# Patient Record
Sex: Male | Born: 1970 | Race: White | Hispanic: No | Marital: Married | Smoking: Never smoker
Health system: Southern US, Community
[De-identification: ages and names within clinical notes are randomized; demographics above are authoritative.]

## PROBLEM LIST (undated history)

## (undated) DIAGNOSIS — E119 Type 2 diabetes mellitus without complications: Secondary | ICD-10-CM

## (undated) DIAGNOSIS — T7840XA Allergy, unspecified, initial encounter: Secondary | ICD-10-CM

## (undated) DIAGNOSIS — I10 Essential (primary) hypertension: Secondary | ICD-10-CM

## (undated) HISTORY — DX: Allergy, unspecified, initial encounter: T78.40XA

## (undated) HISTORY — PX: NO PAST SURGERIES: SHX2092

---

## 2012-09-22 ENCOUNTER — Emergency Department: Payer: Self-pay | Admitting: Emergency Medicine

## 2012-09-22 LAB — CBC
HGB: 12.5 g/dL — ABNORMAL LOW (ref 13.0–18.0)
MCHC: 32.8 g/dL (ref 32.0–36.0)
Platelet: 275 10*3/uL (ref 150–440)
RDW: 15 % — ABNORMAL HIGH (ref 11.5–14.5)
WBC: 8.9 10*3/uL (ref 3.8–10.6)

## 2012-09-22 LAB — COMPREHENSIVE METABOLIC PANEL
Albumin: 4.3 g/dL (ref 3.4–5.0)
Alkaline Phosphatase: 79 U/L (ref 50–136)
Anion Gap: 4 — ABNORMAL LOW (ref 7–16)
Bilirubin,Total: 0.3 mg/dL (ref 0.2–1.0)
Chloride: 106 mmol/L (ref 98–107)
Co2: 29 mmol/L (ref 21–32)
Creatinine: 0.89 mg/dL (ref 0.60–1.30)
EGFR (African American): >60
EGFR (Non-African Amer.): >60
Glucose: 96 mg/dL (ref 65–99)
Sodium: 139 mmol/L (ref 136–145)
Total Protein: 8.3 g/dL — ABNORMAL HIGH (ref 6.4–8.2)

## 2012-09-22 LAB — CK TOTAL AND CKMB (NOT AT ARMC)
CK, Total: 108 U/L (ref 35–232)
CK-MB: 1 ng/mL (ref 0.5–3.6)

## 2012-09-22 LAB — TROPONIN I: Troponin-I: <0.02 ng/mL

## 2012-10-08 ENCOUNTER — Emergency Department: Payer: Self-pay | Admitting: Emergency Medicine

## 2015-09-21 ENCOUNTER — Emergency Department
Admission: EM | Admit: 2015-09-21 | Discharge: 2015-09-21 | Disposition: A | Discharge status: Home / Self Care | Payer: Managed Care, Other (non HMO) | Attending: Emergency Medicine | Admitting: Emergency Medicine

## 2015-09-21 DIAGNOSIS — J309 Allergic rhinitis, unspecified: Secondary | ICD-10-CM | POA: Diagnosis not present

## 2015-09-21 DIAGNOSIS — R040 Epistaxis: Secondary | ICD-10-CM | POA: Diagnosis present

## 2015-09-21 MED ORDER — OXYMETAZOLINE HCL 0.05 % NA SOLN: 2.0000 | Freq: Two times a day (BID) | NASAL | Status: DC | PRN

## 2015-09-21 NOTE — Discharge Instructions (Signed)
Allergic Rhinitis Allergic rhinitis is when the mucous membranes in the nose respond to allergens. Allergens are particles in the air that cause your body to have an allergic reaction. This causes you to release allergic antibodies. Through a chain of events, these eventually cause you to release histamine into the blood stream. Although meant to protect the body, it is this release of histamine that causes your discomfort, such as frequent sneezing, congestion, and an itchy, runny nose.  CAUSES Seasonal allergic rhinitis (hay fever) is caused by pollen allergens that may come from grasses, trees, and weeds. Year-round allergic rhinitis (perennial allergic rhinitis) is caused by allergens such as house dust mites, pet dander, and mold spores. SYMPTOMS  Nasal stuffiness (congestion).  Itchy, runny nose with sneezing and tearing of the eyes. DIAGNOSIS Your health care provider can help you determine the allergen or allergens that trigger your symptoms. If you and your health care provider are unable to determine the allergen, skin or blood testing may be used. Your health care provider will diagnose your condition after taking your health history and performing a physical exam. Your health care provider may assess you for other related conditions, such as asthma, pink eye, or an ear infection. TREATMENT Allergic rhinitis does not have a cure, but it can be controlled by:  Medicines that block allergy symptoms. These may include allergy shots, nasal sprays, and oral antihistamines.  Avoiding the allergen. Hay fever may often be treated with antihistamines in pill or nasal spray forms. Antihistamines block the effects of histamine. There are over-the-counter medicines that may help with nasal congestion and swelling around the eyes. Check with your health care provider before taking or giving this medicine. If avoiding the allergen or the medicine prescribed do not work, there are many new medicines  your health care provider can prescribe. Stronger medicine may be used if initial measures are ineffective. Desensitizing injections can be used if medicine and avoidance does not work. Desensitization is when a patient is given ongoing shots until the body becomes less sensitive to the allergen. Make sure you follow up with your health care provider if problems continue. HOME CARE INSTRUCTIONS It is not possible to completely avoid allergens, but you can reduce your symptoms by taking steps to limit your exposure to them. It helps to know exactly what you are allergic to so that you can avoid your specific triggers. SEEK MEDICAL CARE IF:  You have a fever.  You develop a cough that does not stop easily (persistent).  You have shortness of breath.  You start wheezing.  Symptoms interfere with normal daily activities.   This information is not intended to replace advice given to you by your health care provider. Make sure you discuss any questions you have with your health care provider.   Document Released: 03/27/2001 Document Revised: 07/23/2014 Document Reviewed: 03/09/2013 Elsevier Interactive Patient Education 2016 [place] are common. They are due to a crack in the inside lining of your nose (mucous membrane) or from a small blood vessel that starts to bleed. Nosebleeds can be caused by many conditions, such as injury, infections, dry mucous membranes or dry climate, medicines, nose picking, and home heating and cooling systems. Most nosebleeds come from blood vessels in the front of your nose. HOME CARE INSTRUCTIONS   Try controlling your nosebleed by pinching your nostrils gently and continuously for at least 10 minutes.  Avoid blowing or sniffing your nose for a number of hours after having  a nosebleed.  Do not put gauze inside your nose yourself. If your nose was packed by your health care provider, try to maintain the pack inside of your nose until  your health care provider removes it.  If a gauze pack was used and it starts to fall out, gently replace it or cut off the end of it.  If a balloon catheter was used to pack your nose, do not cut or remove it unless your health care provider has instructed you to do that.  Avoid lying down while you are having a nosebleed. Sit up and lean forward.  Use a nasal spray decongestant to help with a nosebleed as directed by your health care provider.  Do not use petroleum jelly or mineral oil in your nose. These can drip into your lungs.  Maintain humidity in your home by using less air conditioning or by using a humidifier.  Aspirinand blood thinners make bleeding more likely. If you are prescribed these medicines and you suffer from nosebleeds, ask your health care provider if you should stop taking the medicines or adjust the dose. Do not stop medicines unless directed by your health care provider  Resume your normal activities as you are able, but avoid straining, lifting, or bending at the waist for several days.  If your nosebleed was caused by dry mucous membranes, use over-the-counter saline nasal spray or gel. This will keep the mucous membranes moist and allow them to heal. If you must use a lubricant, choose the water-soluble variety. Use it only sparingly, and do not use it within several hours of lying down.  Keep all follow-up visits as directed by your health care provider. This is important. SEEK MEDICAL CARE IF:  You have a fever.  You get frequent nosebleeds.  You are getting nosebleeds more often. SEEK IMMEDIATE MEDICAL CARE IF:  Your nosebleed lasts longer than 20 minutes.  Your nosebleed occurs after an injury to your face, and your nose looks crooked or broken.  You have unusual bleeding from other parts of your body.  You have unusual bruising on other parts of your body.  You feel light-headed or you faint.  You become sweaty.  You vomit blood.  Your  nosebleed occurs after a head injury.   This information is not intended to replace advice given to you by your health care provider. Make sure you discuss any questions you have with your health care provider.   Document Released: 04/11/2005 Document Revised: 07/23/2014 Document Reviewed: 02/15/2014 Elsevier Interactive Patient Education [employer].

## 2015-09-21 NOTE — ED Provider Notes (Signed)
[facility] Emergency Department Provider Note  ____________________________________________  Time seen: Approximately 9:04 PM  I have reviewed the triage vital signs and the nursing notes.   HISTORY  Chief Complaint Epistaxis    HPI Charles Mathis is a 45 y.o. male who presents emergency department for nosebleed. Patient states that he has known allergic rhinitis and is being treated by his primary care for same. He states that he has had increased amount of sneezing and runny nose which he has been rubbing it. He states that he has had "a few" episodes of epistaxis. Patient denies any headache, lightheadedness, visual acuity changes, neck pain, chest pain, shortness of breath, nausea or vomiting. He states that nosebleed is well controlled with direct pressure.   No past medical history on file.  There are no active problems to display for this patient.   No past surgical history on file.  Current Outpatient Rx  Name  Route  Sig  Dispense  Refill  . oxymetazoline (AFRIN) 0.05 % nasal spray   Each Nare   Place 2 sprays into both nostrils 2 (two) times daily as needed (epistaxis).   15 mL   2     Allergies Review of patient's allergies indicates no known allergies.  No family history on file.  Social History Social History  Substance Use Topics  . Smoking status: Not on file  . Smokeless tobacco: Not on file  . Alcohol Use: Not on file     Review of Systems  Constitutional: No fever/chills Eyes: No visual changes. No discharge ENT: No sore throat. Also for nasal congestion. Positive for sneezing. Positive for epistaxis. Cardiovascular: no chest pain. Respiratory: no cough. No SOB. Skin: Negative for rash. Neurological: Negative for headaches, focal weakness or numbness. 10-point ROS otherwise negative.  ____________________________________________   PHYSICAL EXAM:  VITAL SIGNS: ED Triage Vitals  Enc Vitals Group     BP  09/21/15 2030 164/83 mmHg     Pulse Rate 09/21/15 2030 88     Resp 09/21/15 2030 18     Temp 09/21/15 2030 98.3 F (36.8 C)     Temp src --      SpO2 09/21/15 2030 97 %     Weight --      Height --      Head Cir --      Peak Flow --      Pain Score 09/21/15 2030 1     Pain Loc --      Pain Edu? --      Excl. in [place]? --      Constitutional: Alert and oriented. Well appearing and in no acute distress. Eyes: Conjunctivae are normal. PERRL. EOMI. Head: Atraumatic. ENT:      Ears:       Nose: Boggy turbinates are visualized. Moderate clear rhinorrhea. Scabbing is noted over the Kiesselbach plexus in the right nares. No current bleeding.      Mouth/Throat: Mucous membranes are moist.  Neck: No stridor.   Hematological/Lymphatic/Immunilogical: No cervical lymphadenopathy. Cardiovascular: Normal rate, regular rhythm. Normal S1 and S2.  Good peripheral circulation. Respiratory: Normal respiratory effort without tachypnea or retractions. Lungs CTAB. Neurologic:  Normal speech and language. No gross focal neurologic deficits are appreciated.  Skin:  Skin is warm, dry and intact. No rash noted. Psychiatric: Mood and affect are normal. Speech and behavior are normal. Patient exhibits appropriate insight and judgement.   ____________________________________________   LABS (all labs ordered are listed, but only  abnormal results are displayed)  Labs Reviewed - No data to display ____________________________________________  EKG   ____________________________________________  RADIOLOGY   No results found.  ____________________________________________    PROCEDURES  Procedure(s) performed:       Medications - No data to display   ____________________________________________   INITIAL IMPRESSION / ASSESSMENT AND PLAN / ED COURSE  Pertinent labs & imaging results that were available during my care of the patient were reviewed by me and considered in my medical  decision making (see chart for details).  Patient's diagnosis is consistent with epistaxis from known allergic rhinitis. Patient is to continue his Zyrtec and Flonase. Patient will be placed on Afrin for return of symptoms. If patient continues to have symptoms he will follow up with ENT specialist. Patient verbalizes understanding of diagnosis and treatment plan and verbalizes compliance of same. Patient is given ED precautions to return to the ED for any worsening or new symptoms.     ____________________________________________  FINAL CLINICAL IMPRESSION(S) / ED DIAGNOSES  Final diagnoses:  Epistaxis  Allergic rhinitis, unspecified allergic rhinitis type      NEW MEDICATIONS STARTED DURING THIS VISIT:  New Prescriptions   OXYMETAZOLINE (AFRIN) 0.05 % NASAL SPRAY    Place 2 sprays into both nostrils 2 (two) times daily as needed (epistaxis).        This chart was dictated using voice recognition software/Dragon. Despite best efforts to proofread, errors can occur which can change the meaning. Any change was purely unintentional.    Darletta Moll, PA-C 09/21/15 2115  Daymon Larsen, MD 09/21/15 2228

## 2015-09-21 NOTE — ED Notes (Signed)
Pt reports nose bleed starting tonight off and on. Currently bleeding controlled.

## 2016-04-10 ENCOUNTER — Encounter: Payer: Self-pay | Admitting: Emergency Medicine

## 2016-04-10 ENCOUNTER — Emergency Department
Admission: EM | Admit: 2016-04-10 | Discharge: 2016-04-10 | Disposition: A | Discharge status: Home / Self Care | Payer: Managed Care, Other (non HMO) | Attending: Emergency Medicine | Admitting: Emergency Medicine

## 2016-04-10 DIAGNOSIS — I10 Essential (primary) hypertension: Secondary | ICD-10-CM | POA: Insufficient documentation

## 2016-04-10 DIAGNOSIS — R319 Hematuria, unspecified: Secondary | ICD-10-CM | POA: Diagnosis not present

## 2016-04-10 HISTORY — DX: Essential (primary) hypertension: I10

## 2016-04-10 LAB — BASIC METABOLIC PANEL
ANION GAP: 4 — AB (ref 5–15)
BUN: 19 mg/dL (ref 6–20)
CO2: 30 mmol/L (ref 22–32)
CREATININE: 0.93 mg/dL (ref 0.61–1.24)
Calcium: 9.3 mg/dL (ref 8.9–10.3)
Chloride: 103 mmol/L (ref 101–111)
Glucose, Bld: 97 mg/dL (ref 65–99)
Potassium: 3.6 mmol/L (ref 3.5–5.1)
SODIUM: 137 mmol/L (ref 135–145)

## 2016-04-10 LAB — URINALYSIS COMPLETE WITH MICROSCOPIC (ARMC ONLY)
Bacteria, UA: NONE SEEN
Bilirubin Urine: NEGATIVE
Glucose, UA: NEGATIVE mg/dL
KETONES UR: NEGATIVE mg/dL
Leukocytes, UA: NEGATIVE
Nitrite: NEGATIVE
PH: 6 (ref 5.0–8.0)
PROTEIN: 100 mg/dL — AB
SPECIFIC GRAVITY, URINE: 1.018 (ref 1.005–1.030)

## 2016-04-10 LAB — CBC WITH DIFFERENTIAL/PLATELET
BASOS ABS: 0.1 10*3/uL (ref 0–0.1)
BASOS PCT: 1 %
EOS ABS: 0.4 10*3/uL (ref 0–0.7)
EOS PCT: 6 %
HCT: 39.1 % — ABNORMAL LOW (ref 40.0–52.0)
Hemoglobin: 13 g/dL (ref 13.0–18.0)
Lymphocytes Relative: 25 %
Lymphs Abs: 1.9 10*3/uL (ref 1.0–3.6)
MCH: 26.6 pg (ref 26.0–34.0)
MCHC: 33.2 g/dL (ref 32.0–36.0)
MCV: 80.2 fL (ref 80.0–100.0)
MONO ABS: 0.8 10*3/uL (ref 0.2–1.0)
Monocytes Relative: 11 %
NEUTROS ABS: 4.6 10*3/uL (ref 1.4–6.5)
Neutrophils Relative %: 59 %
PLATELETS: 261 10*3/uL (ref 150–440)
RBC: 4.87 MIL/uL (ref 4.40–5.90)
RDW: 14.8 % — AB (ref 11.5–14.5)
WBC: 7.9 10*3/uL (ref 3.8–10.6)

## 2016-04-10 NOTE — ED Provider Notes (Signed)
[facility] Emergency Department Provider Note  ____________________________________________   First MD Initiated Contact with Patient 04/10/16 1526     (approximate)  I have reviewed the triage vital signs and the nursing notes.   HISTORY  Chief Complaint Hematuria   HPI Charles Mathis is a 45 y.o. male with a history of hypertension who is presenting to the emergency department today with 1 episode of hematuria. He says that he was at work and he urinated frank blood. He says that he has been urinating more frequently lately but denies any pain or burning with urination. Denies any abdominal pain, nausea or vomiting. Denies any known history of cancer in his family. Denies a history of smoking. Says he has ongoing chronic back pain but no worsening of this today.Denies any penile lesions or trauma.   Past Medical History:  Diagnosis Date  . Hypertension     There are no active problems to display for this patient.   History reviewed. No pertinent surgical history.  Prior to Admission medications   Medication Sig Start Date End Date Taking? Authorizing Provider  oxymetazoline (AFRIN) 0.05 % nasal spray Place 2 sprays into both nostrils 2 (two) times daily as needed (epistaxis). 09/21/15   Charline Bills Cuthriell, PA-C    Allergies Review of patient's allergies indicates no known allergies.  No family history on file.  Social History Social History  Substance Use Topics  . Smoking status: Never Smoker  . Smokeless tobacco: Never Used  . Alcohol use No    Review of Systems Constitutional: No fever/chills Eyes: No visual changes. ENT: No sore throat. Cardiovascular: Denies chest pain. Respiratory: Denies shortness of breath. Gastrointestinal: No abdominal pain.  No nausea, no vomiting.  No diarrhea.  No constipation. Genitourinary: Negative for dysuria. Musculoskeletal: Negative for back pain. Skin: Negative for rash. Neurological:  Negative for headaches, focal weakness or numbness.  10-point ROS otherwise negative.  ____________________________________________   PHYSICAL EXAM:  VITAL SIGNS: ED Triage Vitals  Enc Vitals Group     BP 04/10/16 1514 (!) 191/108     Pulse Rate 04/10/16 1514 90     Resp 04/10/16 1514 20     Temp 04/10/16 1514 98 F (36.7 C)     Temp Source 04/10/16 1514 Oral     SpO2 04/10/16 1514 98 %     Weight 04/10/16 1513 245 lb (111.1 kg)     Height 04/10/16 1513 5\' 7"  (1.702 m)     Head Circumference --      Peak Flow --      Pain Score 04/10/16 1519 0     Pain Loc --      Pain Edu? --      Excl. in [place]? --     Constitutional: Alert and oriented. Well appearing and in no acute distress. Eyes: Conjunctivae are normal. PERRL. EOMI. Head: Atraumatic. Nose: No congestion/rhinnorhea. Mouth/Throat: Mucous membranes are moist.   Neck: No stridor.   Cardiovascular: Normal rate, regular rhythm. Grossly normal heart sounds.   Respiratory: Normal respiratory effort.  No retractions. Lungs CTAB. Gastrointestinal: Soft and nontender. No distention.  Genitourinary:  Normal external exam in this circumcised male. No tenderness or lesions externally. No tenderness to the testicles. No erythema, induration or pus visualized. No bleeding source visualized. Musculoskeletal: No lower extremity tenderness nor edema.  No joint effusions. Neurologic:  Normal speech and language. No gross focal neurologic deficits are appreciated. No gait instability. Skin:  Skin is warm, dry  and intact. No rash noted. Psychiatric: Mood and affect are normal. Speech and behavior are normal.  ____________________________________________   LABS (all labs ordered are listed, but only abnormal results are displayed)  Labs Reviewed  URINALYSIS COMPLETEWITH MICROSCOPIC ([facility] ONLY) - Abnormal; Notable for the following:       Result Value   Color, Urine RED (*)    APPearance CLOUDY (*)    Hgb urine dipstick 3+ (*)     Protein, ur 100 (*)    Squamous Epithelial / LPF 0-5 (*)    All other components within normal limits  CBC WITH DIFFERENTIAL/PLATELET - Abnormal; Notable for the following:    HCT 39.1 (*)    RDW 14.8 (*)    All other components within normal limits  BASIC METABOLIC PANEL - Abnormal; Notable for the following:    Anion gap 4 (*)    All other components within normal limits  URINE CULTURE   ____________________________________________  EKG   ____________________________________________  RADIOLOGY   ____________________________________________   PROCEDURES  Procedure(s) performed:   Procedures  Critical Care performed:   ____________________________________________   INITIAL IMPRESSION / ASSESSMENT AND PLAN / ED COURSE  Pertinent labs & imaging results that were available during my care of the patient were reviewed by me and considered in my medical decision making (see chart for details).  ----------------------------------------- 5:13 PM on 04/10/2016 -----------------------------------------  Patient with very reassuring lab work. Urine sent for culture. Patient without any pain. Seems to be hematuria without any associated symptoms. I stressed to him the importance of follow-up with urology. I told him that it is imperative that he follow up with urology because I told him that in the worse case scenario you have a cancerous mass causing the symptoms. I also was very careful to stress that may not be the cause but that he needs to have further workup for his hematuria. He understands the plan and is willing to comply. All questions were answered as per the patient's request. Will be discharged home.  Clinical Course     ____________________________________________   FINAL CLINICAL IMPRESSION(S) / ED DIAGNOSES  Hematuria.    NEW MEDICATIONS STARTED DURING THIS VISIT:  New Prescriptions   No medications on file     Note:  This document was prepared  using Dragon voice recognition software and may include unintentional dictation errors.    Orbie Pyo, MD 04/10/16 [phone]

## 2016-04-10 NOTE — ED Triage Notes (Signed)
States he developed some urinary freq over the past couple of days  But noticed some blood in urine this afternoon   Denies any pain

## 2016-04-10 NOTE — ED Notes (Signed)
Pt states he pees frequently, noticed blood in urine today at work. Pt states "I get a Sobol leaky and it burns there but not when I go." Denies pain. Denies hx of kidney stones and UTI.

## 2016-04-11 LAB — URINE CULTURE: Culture: NO GROWTH

## 2016-12-13 ENCOUNTER — Encounter: Payer: Self-pay | Admitting: Nurse Practitioner

## 2016-12-13 ENCOUNTER — Ambulatory Visit (INDEPENDENT_AMBULATORY_CARE_PROVIDER_SITE_OTHER): Payer: Managed Care, Other (non HMO) | Admitting: Nurse Practitioner

## 2016-12-13 VITALS — BP 192/106 | HR 92 | Temp 98.6°F | Ht 67.0 in | Wt 247.0 lb

## 2016-12-13 DIAGNOSIS — Z6838 Body mass index (BMI) 38.0-38.9, adult: Secondary | ICD-10-CM | POA: Diagnosis not present

## 2016-12-13 DIAGNOSIS — I10 Essential (primary) hypertension: Secondary | ICD-10-CM | POA: Diagnosis not present

## 2016-12-13 DIAGNOSIS — T7840XA Allergy, unspecified, initial encounter: Secondary | ICD-10-CM | POA: Insufficient documentation

## 2016-12-13 MED ORDER — AMLODIPINE-OLMESARTAN 5-40 MG PO TABS: 1.0000 | ORAL_TABLET | Freq: Every day | ORAL | 0 refills | Status: DC

## 2016-12-13 MED ORDER — POTASSIUM CHLORIDE ER 10 MEQ PO TBCR: 10.0000 meq | EXTENDED_RELEASE_TABLET | Freq: Every day | ORAL | 0 refills | Status: DC

## 2016-12-13 MED ORDER — CARVEDILOL 25 MG PO TABS: 25.0000 mg | ORAL_TABLET | Freq: Two times a day (BID) | ORAL | 0 refills | Status: DC

## 2016-12-13 MED ORDER — CHLORTHALIDONE 25 MG PO TABS: 25.0000 mg | ORAL_TABLET | Freq: Every day | ORAL | 0 refills | Status: DC

## 2016-12-13 NOTE — Progress Notes (Signed)
Pt been off all prescription medication x 6mths, because the cost of the medications.

## 2016-12-13 NOTE — Progress Notes (Signed)
Subjective:    Patient ID: Charles Mathis, male    DOB: 12-31-1970, 46 y.o.   MRN: [id number]  Charles Mathis is a 46 y.o. male presenting on 12/13/2016 for Hypertension (establish care)    HPI  Establish Care New Provider Pt last seen by PCP Early 2018.  Obtain records from [facility].   Hypertension Found out he had hypertension w/ nosebleeds.  No epistaxis since being off medications.  Pt denies headache, lightheadedness, dizziness, changes in vision, chest tightness/pressure, palpitations, leg swelling, sudden loss of speech or loss of consciousness.  Current medications: none.   Has been off medications x 2 months.   Prior medications: amlodipine-olmesartan 5-40 mg once daily, carvedilol 25 mg bid, chlorthalidone 25 mg once daily, K-Cl 10 meq tablet once daily.  Prior cardiac evaluation: ECG w/ annual physical.  No known echocardiogram, stress test or cardiac cath.  Diet: Eats fried foods 1-2 times per week, vegetables at every meal, eats lean meat, tries low salt (no salt at table), cooks with [place] salt.  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   Past Medical History:  Diagnosis Date  . Allergy   . Hypertension    Past Surgical History:  Procedure Laterality Date  . NO PAST SURGERIES     Social History   Social History  . Marital status: Single    Spouse name: N/A  . Number of children: N/A  . Years of education: N/A   Occupational History  . Not on file.   Social History Main Topics  . Smoking status: Never Smoker  . Smokeless tobacco: Never Used  . Alcohol use No  . Drug use: No  . Sexual activity: Not on file   Other Topics Concern  . Not on file   Social History Narrative  . No narrative on file   Family History  Problem Relation Age of Onset  . Diabetes Mother   . Heart disease Mother   . Mental illness Mother   . Thyroid  disease Mother   . Heart disease Father   . Diabetes Father   . Mental illness Father    No current outpatient prescriptions on file prior to visit.   No current facility-administered medications on file prior to visit.     Review of Systems  Constitutional: Negative.   HENT: Negative.   Eyes: Negative.   Respiratory: Negative.   Cardiovascular: Negative.   Gastrointestinal: Negative.   Endocrine: Negative.   Genitourinary: Negative.   Musculoskeletal: Positive for arthralgias and back pain.  Skin: Negative.   Allergic/Immunologic: Positive for environmental allergies.  Neurological: Negative.   Hematological: Negative.   Psychiatric/Behavioral: Negative.    Per HPI unless specifically indicated above      Objective:    BP (!) 192/106   Pulse 92   Temp 98.6 F (37 C) (Oral)   Ht 5\' 7"  (1.702 m)   Wt 247 lb (112 kg)   BMI 38.69 kg/m    BP recheck 190/104  Wt Readings from Last 3 Encounters:  12/13/16 247 lb (112 kg)  04/10/16 245 lb (111.1 kg)    Physical Exam  Constitutional: He is oriented to person, place, and time. He appears well-developed and well-nourished.  HENT:  Head: Normocephalic and atraumatic.  Right Ear: External ear normal.  Left Ear: External ear normal.  Nose: Nose normal.  Mouth/Throat: Oropharynx is clear and  moist and mucous membranes are normal. Abnormal dentition.  Gingival hyperplasia  Eyes: Conjunctivae are normal. Pupils are equal, round, and reactive to light.  Neck: Normal range of motion. Neck supple. No JVD present. No tracheal deviation present. No thyromegaly present.  Cardiovascular: Normal rate, regular rhythm, S1 normal, S2 normal and normal pulses.   Occasional extrasystoles are present. PMI is not displaced.  Exam reveals gallop and S4.   Negative carotid bruit. Trace pedal edema  Pulmonary/Chest: Effort normal and breath sounds normal. No respiratory distress.  Lymphadenopathy:    He has no cervical adenopathy.    Neurological: He is alert and oriented to person, place, and time.  Skin: Skin is warm and dry.  Psychiatric: He has a normal mood and affect. His behavior is normal. Judgment and thought content normal.  Vitals reviewed.      Assessment & Plan:   Problem List Items Addressed This Visit      Cardiovascular and Mediastinum   Hypertension - Primary    Uncontrolled chronic hypertension - likely resistant considering prior medications.  Pt without medications x 2 months r/t problem w/ insurance coverage and inability to pay out of pocket.  Pt currently asymptomatic.  S4 gallop noted on exam.    Plan: 1. Reviewed hypertensive emergencies: hypertensive crisis and stroke.  Instructed pt to call 9-1-1 if experiences any of these symptoms.   Pt verbalizes understanding. 2. Pt required to use mail-order pharmacy and does not want medications sent to local pharmacy for initial fills.  Only $4 generic on his medication list is carvedilol.  Single 30-day Rx sent to [facility] for this medication.  Refills sent for all other previous anti-hypertension medications to mail order pharmacy.  Pt to start all medications at prior doses.   3. Consider additional cardiac evaluation in future after hypertension medications resumed. 4. Obtain labs lipid panel, CMP, A1c screen common comorbid conditions and evaluate organ function with chronic medication use.  Also recheck CMP at next visit after resuming ACE-inhibitor.  5. Will proceed with close follow up in clinic 2-3 weeks after medications received, which will be approx. 4 weeks from today.       Relevant Medications   aspirin EC 81 MG tablet   carvedilol (COREG) 25 MG tablet   amLODipine-olmesartan (AZOR) 5-40 MG tablet   chlorthalidone (HYGROTON) 25 MG tablet   potassium chloride (K-DUR) 10 MEQ tablet   carvedilol (COREG) 25 MG tablet   Other Relevant Orders   Lipid panel   Comprehensive metabolic panel   Hemoglobin A1c     Other   Class 2 severe  obesity due to excess calories with serious comorbidity and body mass index (BMI) of 38.0 to 38.9 in adult [facility])    Obesity with hypertension places pt at risk for other comorbid conditions.  Pt states has a hard time losing weight and has had this problem for a long time.  Plan: 1. Consider evaluation in future for sleep apnea. 2. Check lipid, CMP, A1c, TSH 3. Discussed diet and exercise.      Relevant Orders   Lipid panel   Comprehensive metabolic panel   Hemoglobin A1c   TSH      Meds ordered this encounter  Medications  . carvedilol (COREG) 25 MG tablet    Sig: Take 25 mg by mouth 2 (two) times daily with a meal.  . chlorthalidone (HYGROTON) 25 MG tablet    Sig: Take 25 mg by mouth daily.  . potassium chloride (K-DUR) 10  MEQ tablet    Sig: Take 10 mEq by mouth daily.  Marland Kitchen amLODipine-olmesartan (AZOR) 5-40 MG tablet    Sig: Take 1 tablet by mouth daily.  Marland Kitchen aspirin EC 81 MG tablet    Sig: Take 81 mg by mouth daily.  . fluticasone (FLONASE) 50 MCG/ACT nasal spray    Sig: Place 2 sprays into both nostrils daily.  . cetirizine (ZYRTEC) 10 MG tablet    Sig: Take 10 mg by mouth daily.      Follow up plan: Return in about 4 weeks (around 01/10/2017) for BP.  [name], DNP, AGPCNP-BC Adult Gerontology Primary Care Nurse Practitioner [facility] 12/13/2016, 3:17 PM

## 2016-12-13 NOTE — Assessment & Plan Note (Signed)
Obesity with hypertension places pt at risk for other comorbid conditions.  Pt states has a hard time losing weight and has had this problem for a long time.  Plan: 1. Consider evaluation in future for sleep apnea. 2. Check lipid, CMP, A1c, TSH 3. Discussed diet and exercise.

## 2016-12-13 NOTE — Progress Notes (Signed)
I have reviewed this encounter including the documentation in this note and/or discussed this patient with the provider, [name], AGPCNP-BC. I am certifying that I agree with the content of this note as supervising physician.  Nobie Putnam, DO [facility] 12/13/2016, 6:40 PM

## 2016-12-13 NOTE — Assessment & Plan Note (Signed)
Uncontrolled chronic hypertension - likely resistant considering prior medications.  Pt without medications x 2 months r/t problem w/ insurance coverage and inability to pay out of pocket.  Pt currently asymptomatic.  S4 gallop noted on exam.    Plan: 1. Reviewed hypertensive emergencies: hypertensive crisis and stroke.  Instructed pt to call 9-1-1 if experiences any of these symptoms.   Pt verbalizes understanding. 2. Pt required to use mail-order pharmacy and does not want medications sent to local pharmacy for initial fills.  Only $4 generic on his medication list is carvedilol.  Single 30-day Rx sent to [facility] for this medication.  Refills sent for all other previous anti-hypertension medications to mail order pharmacy.  Pt to start all medications at prior doses.   3. Consider additional cardiac evaluation in future after hypertension medications resumed. 4. Obtain labs lipid panel, CMP, A1c screen common comorbid conditions and evaluate organ function with chronic medication use.  Also recheck CMP at next visit after resuming ACE-inhibitor.  5. Will proceed with close follow up in clinic 2-3 weeks after medications received, which will be approx. 4 weeks from today.

## 2016-12-13 NOTE — Patient Instructions (Addendum)
Charles Mathis, Thank you for coming in to clinic today.  1. You Charles Mathis be due for FASTING BLOOD WORK (no food or drink after midnight before, only water or coffee without cream/sugar on the morning of) - Please go ahead and schedule a "Lab Only" visit in the morning at the clinic for lab draw in 4-5 days. For Lab Results, once available within 2-3 days of blood draw, you can can log in to MyChart online to view your results and a brief explanation. Also, we can discuss results at next follow-up visit.  2. RESTART all BP medications without any changes. - Return to clinic for close follow- up.   3. With uncontrolled BP you are at risk for  - Hypertensive crisis: sweating, lightheadedness, shortness of breath, near passing out, or passing out.  This is an emergency and call 9-1-1. - STROKE: you recognize stroke with the acronym F-[id number]. F: Face -weakness or drooping A: Arm weakness S: Speech- slurred or difficulty with words T: Time- act quickly and call 9-1-1  Please schedule a follow-up appointment with [name], AGNP to Return in about 4 weeks (around 01/10/2017) for BP.  If you have any other questions or concerns, please feel free to call the clinic or send a message through [facility]. You may also schedule an earlier appointment if necessary.  [name], DNP, AGNP-BC Adult Gerontology Nurse Practitioner [facility]   Hypertension Hypertension, commonly called high blood pressure, is when the force of blood pumping through the arteries is too strong. The arteries are the blood vessels that carry blood from the heart throughout the body. Hypertension forces the heart to work harder to pump blood and may cause arteries to become narrow or stiff. Having untreated or uncontrolled hypertension can cause heart attacks, strokes, kidney disease, and other problems. A blood pressure reading consists of a higher number over a lower number. Ideally, your blood pressure should  be below 120/80. The first ("top") number is called the systolic pressure. It is a measure of the pressure in your arteries as your heart beats. The second ("bottom") number is called the diastolic pressure. It is a measure of the pressure in your arteries as the heart relaxes. What are the causes? The cause of this condition is not known. What increases the risk? Some risk factors for high blood pressure are under your control. Others are not. Factors you can change  Smoking.  Having type 2 diabetes mellitus, high cholesterol, or both.  Not getting enough exercise or physical activity.  Being overweight.  Having too much fat, sugar, calories, or salt (sodium) in your diet.  Drinking too much alcohol. Factors that are difficult or impossible to change  Having chronic kidney disease.  Having a family history of high blood pressure.  Age. Risk increases with age.  Race. You may be at higher risk if you are African-American.  Gender. Men are at higher risk than women before age 20. After age 81, women are at higher risk than men.  Having obstructive sleep apnea.  Stress. What are the signs or symptoms? Extremely high blood pressure (hypertensive crisis) may cause:  Headache.  Anxiety.  Shortness of breath.  Nosebleed.  Nausea and vomiting.  Severe chest pain.  Jerky movements you cannot control (seizures).  How is this diagnosed? This condition is diagnosed by measuring your blood pressure while you are seated, with your arm resting on a surface. The cuff of the blood pressure monitor Charles Mathis be placed directly against  the skin of your upper arm at the level of your heart. It should be measured at least twice using the same arm. Certain conditions can cause a difference in blood pressure between your right and left arms. Certain factors can cause blood pressure readings to be lower or higher than normal (elevated) for a short period of time:  When your blood pressure  is higher when you are in a health care provider's office than when you are at home, this is called white coat hypertension. Most people with this condition do not need medicines.  When your blood pressure is higher at home than when you are in a health care provider's office, this is called masked hypertension. Most people with this condition may need medicines to control blood pressure.  If you have a high blood pressure reading during one visit or you have normal blood pressure with other risk factors:  You may be asked to return on a different day to have your blood pressure checked again.  You may be asked to monitor your blood pressure at home for 1 week or longer.  If you are diagnosed with hypertension, you may have other blood or imaging tests to help your health care provider understand your overall risk for other conditions. How is this treated? This condition is treated by making healthy lifestyle changes, such as eating healthy foods, exercising more, and reducing your alcohol intake. Your health care provider may prescribe medicine if lifestyle changes are not enough to get your blood pressure under control, and if:  Your systolic blood pressure is above 130.  Your diastolic blood pressure is above 80.  Your personal target blood pressure may vary depending on your medical conditions, your age, and other factors. Follow these instructions at home: Eating and drinking  Eat a diet that is high in fiber and potassium, and low in sodium, added sugar, and fat. An example eating plan is called the DASH (Dietary Approaches to Stop Hypertension) diet. To eat this way: ? Eat plenty of fresh fruits and vegetables. Try to fill half of your plate at each meal with fruits and vegetables. ? Eat whole grains, such as whole wheat pasta, brown rice, or whole grain bread. Fill about one quarter of your plate with whole grains. ? Eat or drink low-fat dairy products, such as skim milk or low-fat  yogurt. ? Avoid fatty cuts of meat, processed or cured meats, and poultry with skin. Fill about one quarter of your plate with lean proteins, such as fish, chicken without skin, beans, eggs, and tofu. ? Avoid premade and processed foods. These tend to be higher in sodium, added sugar, and fat.  Reduce your daily sodium intake. Most people with hypertension should eat less than 1,500 mg of sodium a day.  Limit alcohol intake to no more than 1 drink a day for nonpregnant women and 2 drinks a day for men. One drink equals 12 oz of beer, 5 oz of wine, or 1 oz of hard liquor. Lifestyle  Work with your health care provider to maintain a healthy body weight or to lose weight. Ask what an ideal weight is for you.  Get at least 30 minutes of exercise that causes your heart to beat faster (aerobic exercise) most days of the week. Activities may include walking, swimming, or biking.  Include exercise to strengthen your muscles (resistance exercise), such as pilates or lifting weights, as part of your weekly exercise routine. Try to do these types of exercises  for 30 minutes at least 3 days a week.  Do not use any products that contain nicotine or tobacco, such as cigarettes and e-cigarettes. If you need help quitting, ask your health care provider.  Monitor your blood pressure at home as told by your health care provider.  Keep all follow-up visits as told by your health care provider. This is important. Medicines  Take over-the-counter and prescription medicines only as told by your health care provider. Follow directions carefully. Blood pressure medicines must be taken as prescribed.  Do not skip doses of blood pressure medicine. Doing this puts you at risk for problems and can make the medicine less effective.  Ask your health care provider about side effects or reactions to medicines that you should watch for. Contact a health care provider if:  You think you are having a reaction to a  medicine you are taking.  You have headaches that keep coming back (recurring).  You feel dizzy.  You have swelling in your ankles.  You have trouble with your vision. Get help right away if:  You develop a severe headache or confusion.  You have unusual weakness or numbness.  You feel faint.  You have severe pain in your chest or abdomen.  You vomit repeatedly.  You have trouble breathing. Summary  Hypertension is when the force of blood pumping through your arteries is too strong. If this condition is not controlled, it may put you at risk for serious complications.  Your personal target blood pressure may vary depending on your medical conditions, your age, and other factors. For most people, a normal blood pressure is less than 120/80.  Hypertension is treated with lifestyle changes, medicines, or a combination of both. Lifestyle changes include weight loss, eating a healthy, low-sodium diet, exercising more, and limiting alcohol. This information is not intended to replace advice given to you by your health care provider. Make sure you discuss any questions you have with your health care provider. Document Released: 07/02/2005 Document Revised: 05/30/2016 Document Reviewed: 05/30/2016 Elsevier Interactive Patient Education  [employer].

## 2016-12-20 ENCOUNTER — Other Ambulatory Visit: Payer: Managed Care, Other (non HMO)

## 2016-12-21 ENCOUNTER — Other Ambulatory Visit: Payer: Self-pay

## 2016-12-21 LAB — COMPREHENSIVE METABOLIC PANEL
ALT: 37 U/L (ref 9–46)
AST: 32 U/L (ref 10–40)
Albumin: 4.1 g/dL (ref 3.6–5.1)
Alkaline Phosphatase: 68 U/L (ref 40–115)
BUN: 11 mg/dL (ref 7–25)
CO2: 26 mmol/L (ref 20–31)
Calcium: 9.1 mg/dL (ref 8.6–10.3)
Chloride: 100 mmol/L (ref 98–110)
Creat: 0.89 mg/dL (ref 0.60–1.35)
Glucose, Bld: 94 mg/dL (ref 65–99)
Potassium: 3.9 mmol/L (ref 3.5–5.3)
Sodium: 137 mmol/L (ref 135–146)
Total Bilirubin: 0.5 mg/dL (ref 0.2–1.2)
Total Protein: 7.5 g/dL (ref 6.1–8.1)

## 2016-12-21 LAB — LIPID PANEL
Cholesterol: 176 mg/dL (ref <200)
HDL: 23 mg/dL — ABNORMAL LOW (ref >40)
LDL Cholesterol: 106 mg/dL — ABNORMAL HIGH (ref <100)
Total CHOL/HDL Ratio: 7.7 Ratio — ABNORMAL HIGH (ref <5.0)
Triglycerides: 237 mg/dL — ABNORMAL HIGH (ref <150)
VLDL: 47 mg/dL — ABNORMAL HIGH (ref <30)

## 2016-12-21 LAB — HEMOGLOBIN A1C
Hgb A1c MFr Bld: 6.6 % — ABNORMAL HIGH (ref <5.7)
Mean Plasma Glucose: 143 mg/dL

## 2016-12-21 LAB — TSH: TSH: 9.03 mIU/L — ABNORMAL HIGH (ref 0.40–4.50)

## 2016-12-21 MED ORDER — ATORVASTATIN CALCIUM 20 MG PO TABS: 20.0000 mg | ORAL_TABLET | Freq: Every day | ORAL | 2 refills | Status: DC

## 2017-01-22 ENCOUNTER — Encounter: Payer: Self-pay | Admitting: Nurse Practitioner

## 2017-01-22 ENCOUNTER — Ambulatory Visit (INDEPENDENT_AMBULATORY_CARE_PROVIDER_SITE_OTHER): Payer: Managed Care, Other (non HMO) | Admitting: Nurse Practitioner

## 2017-01-22 VITALS — BP 128/67 | HR 78 | Temp 98.5°F | Ht 67.0 in | Wt 247.6 lb

## 2017-01-22 DIAGNOSIS — I10 Essential (primary) hypertension: Secondary | ICD-10-CM | POA: Diagnosis not present

## 2017-01-22 NOTE — Progress Notes (Signed)
I have reviewed this encounter including the documentation in this note and/or discussed this patient with the provider, [name], AGPCNP-BC. I am certifying that I agree with the content of this note as supervising physician.  Nobie Putnam, [facility] 01/22/2017, 5:18 PM

## 2017-01-22 NOTE — Assessment & Plan Note (Signed)
Controlled chronic hypertension - likely resistant considering medication need.  BP at goal.  Plan: 1. Continue taking amlodipine-olmesartan 5-40 mg once daily, atorvastatin 20 mg once daily, carvedilol 25 mg twice daily, chlorthalidone 25 mg once daily 2. Obtain labs BMP after resuming ACE-inhibitor.  3. Encouraged heart health diet and increasing exercise. 4. Check BP 1-2 x per week at home, keep log, and bring to clinic at next appointment. 5. Follow up 3 months.

## 2017-01-22 NOTE — Patient Instructions (Addendum)
Charles Mathis, Thank you for coming in to clinic today.  1. Call to schedule a dentist visit this week.  2. For your blood pressure: -  START checking your BP at home 1-2 x per week and keep a log.  BP goal is less than 130/80 and higher than 100/60.  - Great work with exercise and diet changes. - Go to [facility] tomorrow afternoon for bloodwork.  Please schedule a follow-up appointment with [name], AGNP to Return in about 3 months (around 04/24/2017) for blood pressure.  If you have any other questions or concerns, please feel free to call the clinic or send a message through [place]. You may also schedule an earlier appointment if necessary.  [name], DNP, AGNP-BC Adult Gerontology Nurse Practitioner [place]    How to Take Your Blood Pressure Blood pressure is a measurement of how strongly your blood is pressing against the walls of your arteries. Arteries are blood vessels that carry blood from your heart throughout your body. Your health care provider takes your blood pressure at each office visit. You can also take your own blood pressure at home with a blood pressure machine. You may need to take your own blood pressure:  To confirm a diagnosis of high blood pressure (hypertension).  To monitor your blood pressure over time.  To make sure your blood pressure medicine is working.  Supplies needed: To take your blood pressure, you will need a blood pressure machine. You can buy a blood pressure machine, or blood pressure monitor, at most drugstores or online. There are several types of home blood pressure monitors. When choosing one, consider the following:  Choose a monitor that has an arm cuff.  Choose a monitor that wraps snugly around your upper arm. You should be able to fit only one finger between your arm and the cuff.  Do not choose a monitor that measures your blood pressure from your wrist or finger.  Your health care provider can  suggest a reliable monitor that will meet your needs. How to prepare To get the most accurate reading, avoid the following for 30 minutes before you check your blood pressure:  Drinking caffeine.  Drinking alcohol.  Eating.  Smoking.  Exercising.  Five minutes before you check your blood pressure:  Empty your bladder.  Sit quietly without talking in a dining chair, rather than in a soft couch or armchair.  How to take your blood pressure To check your blood pressure, follow the instructions in the manual that came with your blood pressure monitor. If you have a digital blood pressure monitor, the instructions may be as follows: 1. Sit up straight. 2. Place your feet on the floor. Do not cross your ankles or legs. 3. Rest your left arm at the level of your heart on a table or desk or on the arm of a chair. 4. Pull up your shirt sleeve. 5. Wrap the blood pressure cuff around the upper part of your left arm, 1 inch (2.5 cm) above your elbow. It is best to wrap the cuff around bare skin. 6. Fit the cuff snugly around your arm. You should be able to place only one finger between the cuff and your arm. 7. Position the cord inside the groove of your elbow. 8. Press the power button. 9. Sit quietly while the cuff inflates and deflates. 10. Read the digital reading on the monitor screen and write it down (record it). 11. Wait 2-3 minutes, then repeat  the steps, starting at step 1.  What does my blood pressure reading mean? A blood pressure reading consists of a higher number over a lower number. Ideally, your blood pressure should be below 120/80. The first ("top") number is called the systolic pressure. It is a measure of the pressure in your arteries as your heart beats. The second ("bottom") number is called the diastolic pressure. It is a measure of the pressure in your arteries as the heart relaxes. Blood pressure is classified into four stages. The following are the stages for adults  who do not have a short-term serious illness or a chronic condition. Systolic pressure and diastolic pressure are measured in a unit called mm Hg. Normal  Systolic pressure: below 786.  Diastolic pressure: below 80. Elevated  Systolic pressure: 754-492.  Diastolic pressure: below 80. Hypertension stage 1  Systolic pressure: 010-071.  Diastolic pressure: 21-97. Hypertension stage 2  Systolic pressure: 588 or above.  Diastolic pressure: 90 or above. You can have prehypertension or hypertension even if only the systolic or only the diastolic number in your reading is higher than normal. Follow these instructions at home:  Check your blood pressure as often as recommended by your health care provider.  Take your monitor to the next appointment with your health care provider to make sure: ? That you are using it correctly. ? That it provides accurate readings.  Be sure you understand what your goal blood pressure numbers are.  Tell your health care provider if you are having any side effects from blood pressure medicine. Contact a health care provider if:  Your blood pressure is consistently high. Get help right away if:  Your systolic blood pressure is higher than 180.  Your diastolic blood pressure is higher than 110. This information is not intended to replace advice given to you by your health care provider. Make sure you discuss any questions you have with your health care provider. Document Released: 12/09/2015 Document Revised: 02/21/2016 Document Reviewed: 12/09/2015 Elsevier Interactive Patient Education  [employer].

## 2017-01-22 NOTE — Progress Notes (Signed)
Subjective:    Patient ID: Charles Mathis, male    DOB: [birth year], 46 y.o.   MRN: [id number]  Charles Mathis is a 46 y.o. male presenting on 01/22/2017 for Hypertension   HPI Hypertension Is not checking BP at home. -Current medications: amlodipine-olmesartan 5-40 mg once daily, atorvastatin 20 mg once daily, carvedilol 25 mg twice daily, chlorthalidone 25 mg once daily, and  tolerating well without side effects  - Reports only two days of missed doses about two-three days after last visit.  -Pt denies headache, lightheadedness, dizziness, changes in vision, chest tightness/pressure, palpitations, leg swelling, sudden loss of speech or loss of consciousness. Diet: avoiding burgers and pizza.  Cut out most soft drinks drinking water/gatorade flavor packs. He notes he is making changes slowly, but is very hard. Physical activity: Walking around apt complex 30-40 minutes 2-3 times per week.  Social History  Substance Use Topics  . Smoking status: Never Smoker  . Smokeless tobacco: Never Used  . Alcohol use No    Review of Systems Per HPI unless specifically indicated above     Objective:    BP 128/67 (BP Location: Right Arm, Patient Position: Sitting, Cuff Size: Large)   Pulse 78   Temp 98.5 F (36.9 C) (Oral)   Ht 5\' 7"  (1.702 m)   Wt 247 lb 9.6 oz (112.3 kg)   BMI 38.78 kg/m   Wt Readings from Last 3 Encounters:  01/22/17 247 lb 9.6 oz (112.3 kg)  12/13/16 247 lb (112 kg)  04/10/16 245 lb (111.1 kg)    Physical Exam  General - healthy, well-appearing, NAD HEENT - Normocephalic, atraumatic, PERRL, EOMI,  Neck - supple, non-tender, no LAD, no carotid bruits Heart - RRR, bradycardia, no murmurs heard Lungs - Clear throughout all lobes, no wheezing, crackles, or rhonchi. Normal work of breathing. Extremeties - non-tender, no edema, cap refill < 2 seconds, peripheral pulses intact +2 bilaterally Skin - warm, dry, no rashes Neuro - awake, alert, oriented x 3 Psych -  Normal mood and affect, normal behavior    Results for orders placed or performed in visit on 12/13/16  Lipid panel  Result Value Ref Range   Cholesterol 176 <200 mg/dL   Triglycerides 237 (H) <150 mg/dL   HDL 23 (L) >40 mg/dL   Total CHOL/HDL Ratio 7.7 (H) <5.0 Ratio   VLDL 47 (H) <30 mg/dL   LDL Cholesterol 106 (H) <100 mg/dL  Comprehensive metabolic panel  Result Value Ref Range   Sodium 137 135 - 146 mmol/L   Potassium 3.9 3.5 - 5.3 mmol/L   Chloride 100 98 - 110 mmol/L   CO2 26 20 - 31 mmol/L   Glucose, Bld 94 65 - 99 mg/dL   BUN 11 7 - 25 mg/dL   Creat 0.89 0.60 - 1.35 mg/dL   Total Bilirubin 0.5 0.2 - 1.2 mg/dL   Alkaline Phosphatase 68 40 - 115 U/L   AST 32 10 - 40 U/L   ALT 37 9 - 46 U/L   Total Protein 7.5 6.1 - 8.1 g/dL   Albumin 4.1 3.6 - 5.1 g/dL   Calcium 9.1 8.6 - 10.3 mg/dL  Hemoglobin A1c  Result Value Ref Range   Hgb A1c MFr Bld 6.6 (H) <5.7 %   Mean Plasma Glucose 143 mg/dL  TSH  Result Value Ref Range   TSH 9.03 (H) 0.40 - 4.50 mIU/L      Assessment & Plan:   Problem List Items Addressed This Visit  Cardiovascular and Mediastinum   Hypertension - Primary    Controlled chronic hypertension - likely resistant considering medication need.  BP at goal.  Plan: 1. Continue taking amlodipine-olmesartan 5-40 mg once daily, atorvastatin 20 mg once daily, carvedilol 25 mg twice daily, chlorthalidone 25 mg once daily 2. Obtain labs BMP after resuming ACE-inhibitor.  3. Encouraged heart health diet and increasing exercise. 4. Check BP 1-2 x per week at home, keep log, and bring to clinic at next appointment. 5. Follow up 3 months.        Relevant Orders   Basic Metabolic Panel (BMET)        Follow up plan: Return in about 3 months (around 04/24/2017) for blood pressure.   [name], DNP, AGPCNP-BC Adult Gerontology Primary Care Nurse Practitioner [facility] 01/22/2017, 5:01 PM

## 2017-01-25 LAB — BASIC METABOLIC PANEL
BUN/Creatinine Ratio: 20 (ref 9–20)
BUN: 20 mg/dL (ref 6–24)
CO2: 25 mmol/L (ref 20–29)
Calcium: 9.8 mg/dL (ref 8.7–10.2)
Chloride: 100 mmol/L (ref 96–106)
Creatinine, Ser: 1 mg/dL (ref 0.76–1.27)
GFR calc Af Amer: 105 mL/min/{1.73_m2} (ref >59)
GFR calc non Af Amer: 90 mL/min/{1.73_m2} (ref >59)
Glucose: 167 mg/dL — ABNORMAL HIGH (ref 65–99)
Potassium: 4 mmol/L (ref 3.5–5.2)
Sodium: 143 mmol/L (ref 134–144)

## 2017-02-25 ENCOUNTER — Other Ambulatory Visit: Payer: Self-pay | Admitting: Nurse Practitioner

## 2017-02-25 DIAGNOSIS — I10 Essential (primary) hypertension: Secondary | ICD-10-CM

## 2017-03-04 ENCOUNTER — Other Ambulatory Visit: Payer: Self-pay

## 2017-03-04 MED ORDER — ATORVASTATIN CALCIUM 20 MG PO TABS: 20.0000 mg | ORAL_TABLET | Freq: Every day | ORAL | 2 refills | Status: DC

## 2017-04-07 ENCOUNTER — Other Ambulatory Visit: Payer: Self-pay | Admitting: Nurse Practitioner

## 2017-04-07 DIAGNOSIS — I10 Essential (primary) hypertension: Secondary | ICD-10-CM

## 2017-04-08 NOTE — Telephone Encounter (Signed)
Pt needs appointment in next 3-4 weeks.

## 2017-04-25 ENCOUNTER — Ambulatory Visit (INDEPENDENT_AMBULATORY_CARE_PROVIDER_SITE_OTHER): Payer: Managed Care, Other (non HMO) | Admitting: Nurse Practitioner

## 2017-04-25 ENCOUNTER — Encounter: Payer: Self-pay | Admitting: Nurse Practitioner

## 2017-04-25 VITALS — BP 121/60 | HR 75 | Temp 98.3°F | Ht 67.0 in | Wt 252.4 lb

## 2017-04-25 DIAGNOSIS — I10 Essential (primary) hypertension: Secondary | ICD-10-CM

## 2017-04-25 DIAGNOSIS — E039 Hypothyroidism, unspecified: Secondary | ICD-10-CM | POA: Diagnosis not present

## 2017-04-25 MED ORDER — LEVOTHYROXINE SODIUM 50 MCG PO TABS: 50.0000 ug | ORAL_TABLET | Freq: Every day | ORAL | 1 refills | Status: DC

## 2017-04-25 NOTE — Assessment & Plan Note (Signed)
Controlled chronic hypertension - likely resistant considering medication need.  BP at goal.  Plan: 1. Continue taking amlodipine-olmesartan 5-40 mg once daily, carvedilol 25 mg twice daily, chlorthalidone 25 mg once daily 2. Kidney function normal on last check.  3. Encouraged heart healthy diet and increasing exercise. 4. Check BP 1-2 x per week at home, keep log, and bring to clinic at next appointment. 5. Follow up 6 months.

## 2017-04-25 NOTE — Assessment & Plan Note (Signed)
New diagnosis after labs in June w/o prior treatment.  Pt does not endorse significant symptoms, but is overweight w/ recent weight gain.  Plan: 1. Start levothyroxine 50 mcg once daily. Discussed appropriate dosing. 2. Recheck TSH 6 weeks. 3. Follow up 6 months.

## 2017-04-25 NOTE — Progress Notes (Signed)
Subjective:    Patient ID: Charles Mathis, male    DOB: 06-13-1971, 46 y.o.   MRN: [id number]  Charles Mathis is a 46 y.o. male presenting on 04/25/2017 for Hypertension (f/u )   HPI Hypertension - He is not checking BP at home or outside of clinic.    - Current medications: amlodipine-olmesartan 5-40 mg daily, carvedilol 25 mg once daily, chlorthalidone 25 mg once daily, tolerating well without side effects - He is not currently symptomatic. - Pt denies headache, lightheadedness, dizziness, changes in vision, chest tightness/pressure, palpitations, leg swelling, sudden loss of speech or loss of consciousness. - He  reports no regular exercise routine. - His diet is high in salt, moderate in fat, and moderate in carbohydrates.   Hypothyroidism Pt is largely asymptomatic except for slow weight gain w/ elevated TSH found on screening at last visit.  He does have a significant family history for thyroid disease. - Pt has not yet started taking any levothyroxine.   - He is not currently symptomatic. - He denies, fatigue, excess energy, weight changes, heart racing, heart palpitations, heat and cold intolerance, changes in hair/skin/nails, and lower leg swelling.  - He does not have any compressive symptoms to include difficulty swallowing, globus sensation, or difficulty breathing when lying flat.  Social History  Substance Use Topics  . Smoking status: Never Smoker  . Smokeless tobacco: Never Used  . Alcohol use No    Review of Systems Per HPI unless specifically indicated above     Objective:    BP 121/60 (BP Location: Right Arm, Patient Position: Sitting, Cuff Size: Large)   Pulse 75   Temp 98.3 F (36.8 C) (Oral)   Ht 5\' 7"  (1.702 m)   Wt 252 lb 6.4 oz (114.5 kg)   BMI 39.53 kg/m   Wt Readings from Last 3 Encounters:  04/25/17 252 lb 6.4 oz (114.5 kg)  01/22/17 247 lb 9.6 oz (112.3 kg)  12/13/16 247 lb (112 kg)    Physical Exam  General - obese,  well-appearing, NAD HEENT - Normocephalic, atraumatic, gingival hyperplasia/poor dentition Neck - supple, non-tender, no LAD, mild thyromegaly on palpation, no carotid bruit Heart - RRR, no murmurs heard Lungs - Clear throughout all lobes, no wheezing, crackles, or rhonchi. Normal work of breathing. Extremeties - non-tender, no edema, cap refill < 2 seconds, peripheral pulses intact +2 bilaterally Skin - warm, dry Neuro - awake, alert, oriented x3, normal gait Psych - Normal mood and affect, normal behavior    Results for orders placed or performed in visit on 01/75/10  Basic Metabolic Panel (BMET)  Result Value Ref Range   Glucose 167 (H) 65 - 99 mg/dL   BUN 20 6 - 24 mg/dL   Creatinine, Ser 1.00 0.76 - 1.27 mg/dL   GFR calc non Af Amer 90 >59 mL/min/1.73   GFR calc Af Amer 105 >59 mL/min/1.73   BUN/Creatinine Ratio 20 9 - 20   Sodium 143 134 - 144 mmol/L   Potassium 4.0 3.5 - 5.2 mmol/L   Chloride 100 96 - 106 mmol/L   CO2 25 20 - 29 mmol/L   Calcium 9.8 8.7 - 10.2 mg/dL      Assessment & Plan:   Problem List Items Addressed This Visit      Cardiovascular and Mediastinum   Hypertension    Controlled chronic hypertension - likely resistant considering medication need.  BP at goal.  Plan: 1. Continue taking amlodipine-olmesartan 5-40 mg once daily, carvedilol 25  mg twice daily, chlorthalidone 25 mg once daily 2. Kidney function normal on last check.  3. Encouraged heart healthy diet and increasing exercise. 4. Check BP 1-2 x per week at home, keep log, and bring to clinic at next appointment. 5. Follow up 6 months.          Endocrine   Hypothyroidism - Primary    New diagnosis after labs in June w/o prior treatment.  Pt does not endorse significant symptoms, but is overweight w/ recent weight gain.  Plan: 1. Start levothyroxine 50 mcg once daily. Discussed appropriate dosing. 2. Recheck TSH 6 weeks. 3. Follow up 6 months.      Relevant Medications    levothyroxine (SYNTHROID, LEVOTHROID) 50 MCG tablet      Meds ordered this encounter  Medications  . levothyroxine (SYNTHROID, LEVOTHROID) 50 MCG tablet    Sig: Take 1 tablet (50 mcg total) by mouth daily.    Dispense:  90 tablet    Refill:  1    Order Specific Question:   Supervising Provider    Answer:   Olin Hauser [[id number]]      Follow up plan: Return in about 6 months (around 10/24/2017) for hypertension, hyperlipidemia, hypothyroidism AND in 6 weeks for labs to check thyroid.  [name], DNP, AGPCNP-BC Adult Gerontology Primary Care Nurse Practitioner [facility] 04/25/2017, 6:57 PM

## 2017-04-25 NOTE — Patient Instructions (Signed)
Charles Mathis, Thank you for coming in to clinic today.  1. For your thyroid: - You do have hypothyroidism.  Start taking levothyroxine 50 mcg tablets.  Take 1 (one) tablet each morning before breakfast.  Wait at least 30 minutes to 1 hour before eating or drinking anything except water and taking other medicines.  Please schedule a lab only visit in 6 weeks to recheck your TSH to make sure you are taking the right dose of levothyroxine.  It can take 6 weeks to see a change in TSH after changing the levothyroxine dose.  2. For your blood pressure: - You continue to have good blood pressure control.  Great work with your medications and working to [employer].  We are seeing a Jafari weight gain, so please be aware of how much you are eating.  Cut back on portion sizes and continue adding more fruits and vegetables.  Please schedule a follow-up appointment with [name], AGNP. Return in about 6 months (around 10/24/2017) for hypertension, hyperlipidemia, hypothyroidism AND in 6 weeks for labs to check thyroid.  If you have any other questions or concerns, please feel free to call the clinic or send a message through [place]. You may also schedule an earlier appointment if necessary.  You will receive a survey after today's visit either digitally by e-mail or paper by [employer]. Your experiences and feedback matter to [place].  Please respond so we know how we are doing as we provide care for you.   [name], DNP, AGNP-BC Adult Gerontology Nurse Practitioner [place]   Hypothyroidism Hypothyroidism is a disorder of the thyroid. The thyroid is a large gland that is located in the lower front of the neck. The thyroid releases hormones that control how the body works. With hypothyroidism, the thyroid does not make enough of these hormones. What are the causes? Causes of hypothyroidism may include:  Viral infections.  Pregnancy.  Your own defense system (immune  system) attacking your thyroid.  Certain medicines.  Birth defects.  Past radiation treatments to your head or neck.  Past treatment with radioactive iodine.  Past surgical removal of part or all of your thyroid.  Problems with the gland that is located in the center of your brain (pituitary).  What are the signs or symptoms? Signs and symptoms of hypothyroidism may include:  Feeling as though you have no energy (lethargy).  Inability to tolerate cold.  Weight gain that is not explained by a change in diet or exercise habits.  Dry skin.  Coarse hair.  Menstrual irregularity.  Slowing of thought processes.  Constipation.  Sadness or depression.  How is this diagnosed? Your health care provider may diagnose hypothyroidism with blood tests and ultrasound tests. How is this treated? Hypothyroidism is treated with medicine that replaces the hormones that your body does not make. After you begin treatment, it may take several weeks for symptoms to go away. Follow these instructions at home:  Take medicines only as directed by your health care provider.  If you start taking any new medicines, tell your health care provider.  Keep all follow-up visits as directed by your health care provider. This is important. As your condition improves, your dosage needs may change. You will need to have blood tests regularly so that your health care provider can watch your condition. Contact a health care provider if:  Your symptoms do not get better with treatment.  You are taking thyroid replacement medicine and: ?  You sweat excessively. ? You have tremors. ? You feel anxious. ? You lose weight rapidly. ? You cannot tolerate heat. ? You have emotional swings. ? You have diarrhea. ? You feel weak. Get help right away if:  You develop chest pain.  You develop an irregular heartbeat.  You develop a rapid heartbeat. This information is not intended to replace advice given to  you by your health care provider. Make sure you discuss any questions you have with your health care provider. Document Released: 07/02/2005 Document Revised: 12/08/2015 Document Reviewed: 11/17/2013 Elsevier Interactive Patient Education  2017 [employer].

## 2017-06-04 ENCOUNTER — Other Ambulatory Visit: Payer: Managed Care, Other (non HMO)

## 2017-06-04 DIAGNOSIS — E039 Hypothyroidism, unspecified: Secondary | ICD-10-CM

## 2017-06-04 DIAGNOSIS — R7309 Other abnormal glucose: Secondary | ICD-10-CM

## 2017-06-05 ENCOUNTER — Other Ambulatory Visit: Payer: Self-pay

## 2017-06-05 LAB — TSH: TSH: 3.31 mIU/L (ref 0.40–4.50)

## 2017-06-05 LAB — HEMOGLOBIN A1C
Hgb A1c MFr Bld: 7.4 % of total Hgb — ABNORMAL HIGH (ref <5.7)
Mean Plasma Glucose: 166 (calc)
eAG (mmol/L): 9.2 (calc)

## 2017-06-05 MED ORDER — METFORMIN HCL 500 MG PO TABS: 500.0000 mg | ORAL_TABLET | Freq: Two times a day (BID) | ORAL | 1 refills | Status: DC

## 2017-06-05 NOTE — Progress Notes (Signed)
The pt was notified. No questions or concerns. 

## 2017-07-30 ENCOUNTER — Other Ambulatory Visit: Payer: Self-pay | Admitting: Nurse Practitioner

## 2017-07-30 DIAGNOSIS — I10 Essential (primary) hypertension: Secondary | ICD-10-CM

## 2017-07-31 ENCOUNTER — Other Ambulatory Visit: Payer: Self-pay

## 2017-07-31 MED ORDER — METFORMIN HCL 500 MG PO TABS: 500.0000 mg | ORAL_TABLET | Freq: Two times a day (BID) | ORAL | 1 refills | Status: DC

## 2017-09-05 ENCOUNTER — Ambulatory Visit: Payer: Managed Care, Other (non HMO) | Admitting: Nurse Practitioner

## 2017-09-10 ENCOUNTER — Ambulatory Visit (INDEPENDENT_AMBULATORY_CARE_PROVIDER_SITE_OTHER): Payer: Managed Care, Other (non HMO) | Admitting: Nurse Practitioner

## 2017-09-10 ENCOUNTER — Other Ambulatory Visit: Payer: Self-pay

## 2017-09-10 ENCOUNTER — Encounter: Payer: Self-pay | Admitting: Nurse Practitioner

## 2017-09-10 VITALS — BP 123/67 | HR 74 | Temp 98.2°F | Resp 12 | Ht 67.0 in | Wt 247.0 lb

## 2017-09-10 DIAGNOSIS — Z6838 Body mass index (BMI) 38.0-38.9, adult: Secondary | ICD-10-CM

## 2017-09-10 DIAGNOSIS — E039 Hypothyroidism, unspecified: Secondary | ICD-10-CM

## 2017-09-10 DIAGNOSIS — E119 Type 2 diabetes mellitus without complications: Secondary | ICD-10-CM | POA: Insufficient documentation

## 2017-09-10 DIAGNOSIS — E1165 Type 2 diabetes mellitus with hyperglycemia: Secondary | ICD-10-CM

## 2017-09-10 DIAGNOSIS — I10 Essential (primary) hypertension: Secondary | ICD-10-CM | POA: Diagnosis not present

## 2017-09-10 LAB — POCT GLYCOSYLATED HEMOGLOBIN (HGB A1C): Hemoglobin A1C: 6.8 — ABNORMAL HIGH

## 2017-09-10 MED ORDER — LANCET DEVICE WITH EJECTOR MISC: 1.0000 | Freq: Every day | 0 refills | Status: DC

## 2017-09-10 MED ORDER — LANCETS 28G MISC: 1.0000 | Freq: Every day | 3 refills | Status: DC

## 2017-09-10 NOTE — Assessment & Plan Note (Signed)
Obesity with hypertension and T2DM.  Pt at risk for other comorbid conditions - MI, CAD, hyperlipidemia.  Pt states has a hard time losing weight and has had this problem for a long time.  TSH check revealed hypothyroidism and is now being treated.  Small amount of weight loss in last 3 months, but pt reports recent weight loss after GI viral illness.  Plan: 1. Consider evaluation in future for sleep apnea. - Pt continues to decline today. 2. Check lipid, CMP, TSH 3. Discussed diet and exercise. 4. Followup 3 months.

## 2017-09-10 NOTE — Assessment & Plan Note (Signed)
ControlledDM with A1c 6.8% improved from 7.6% on meftormin and goal A1c < 7.0%. - No known complications except hyperglycemia - Foot exam today normal. - Due for ophthalmology exam.  Plan:  1. Continue current therapy - metformin 500 mg bid 2. Encourage improved lifestyle: - low carb/low glycemic diet - Increase physical activity to 30 minutes most days of the week.  Explained that increased physical activity increases body's use of sugar for energy. 3. Check fasting am CBG.  Bring log to next visit for review. - Provided lancet device and lancet order today.  Pt to call if meter is no longer functioning and for meter brand to supply refills.  If not able to refill, will order new meter at future date. 4. Continue ARB, Statin 5. DM Foot exam done today. Advised to schedule DM ophtho exam, send record 6. Follow-up 3 months.

## 2017-09-10 NOTE — Patient Instructions (Signed)
Charles Mathis, Thank you for coming in to clinic today.  1. No changes to medications today.  Great work on improving blood pressure and DM control.  2. You will be due for FASTING BLOOD WORK.  This means you should eat no food or drink after midnight.  Drink only water or coffee without cream/sugar on the morning of your lab visit. - Please go ahead and schedule a "Lab Only" visit in the morning at the clinic for lab draw in the next 7 days. - Your results will be available about 2-3 days after blood draw.  If you have set up a MyChart account, you can can log in to MyChart online to view your results and a brief explanation. Also, we can discuss your results together at your next office visit if you would like.   Please schedule a follow-up appointment with [name], AGNP. Return in about 3 months (around 12/08/2017) for diabetes, blood pressure, thyroid.  If you have any other questions or concerns, please feel free to call the clinic or send a message through [place]. You may also schedule an earlier appointment if necessary.  You will receive a survey after today's visit either digitally by e-mail or paper by [employer]. Your experiences and feedback matter to [place].  Please respond so we know how we are doing as we provide care for you.   [name], DNP, AGNP-BC Adult Gerontology Nurse Practitioner [place]

## 2017-09-10 NOTE — Assessment & Plan Note (Signed)
Controlled on last visit.  Pt had new diagnosis after labs in June 2018 with repeat TSH in normal limits on levothyroxine 50 mcg once daily.  Pt does not endorse any current symptoms, but is still overweight and finding it hard to lose weight.  Some weight loss since last visit.  Plan: 1. Continue levothyroxine 50 mcg once daily. Discussed appropriate dosing times 2. Recheck TSH.   3. Follow up 6 months.

## 2017-09-10 NOTE — Assessment & Plan Note (Signed)
Controlled chronic hypertension - likely resistant considering medication need.  BP at goal. No identified complications, but is due for kidney function recheck and no recent eye exam.  Plan: 1. Continue taking amlodipine-olmesartan 5-40 mg once daily, carvedilol 25 mg twice daily, chlorthalidone 25 mg once daily. 2. Repeat CMP, lipid, TSH 3. Encouraged heart healthy diet and increasing exercise. 4. Check BP 1-2 x per week at home, keep log, and bring to clinic at next appointment. 5. Follow up 3 months.

## 2017-09-10 NOTE — Progress Notes (Signed)
Subjective:    Patient ID: Charles Mathis, male    DOB: 22-Oct-1970, 46 y.o.   MRN: [id number]  Charles Mathis is a 47 y.o. male presenting on 09/10/2017 for Hypertension; Diabetes; and Hypothyroidism   HPI Hypertension - He is checking BP at home or outside of clinic.  Readings unknown.  Doesn't remember readings from store machines. - Current medications: amlodipine-olmesartan 5-40mg  once daily, carvedilol 25 mg bid with meals, potassium 10 meq daily, tolerating well without side effects - He is not currently symptomatic. - Pt denies headache, lightheadedness, dizziness, changes in vision, chest tightness/pressure, palpitations, leg swelling, sudden loss of speech or loss of consciousness. - He  reports no regular exercise routine.  - His diet is moderate in salt, moderate in fat, and moderate in carbohydrates.  Reduced fried foods, increasing vegetables.  Fruits are rare.  Sweets are avoided and not purchased for home. Still drinks sweet tea.  No other sweet drinks. One 8 oz glass with one meal.   Diabetes Pt presents today for follow up of Type 2 diabetes mellitus. He is not checking CBG at home - Current diabetic medications include: metformin 500 mg one tablet twice daily and occasional diarrhea (but notes more regular BM that prior) - He is not currently symptomatic.  - He denies polydipsia, polyphagia, polyuria, headaches, diaphoresis, shakiness, chills, pain, numbness or tingling in extremities and changes in vision.   - Clinical course has been improving. - Weight trend: decreasing steadily  PREVENTION: Eye exam current (within one year): no Foot exam current (within one year): no  Lipid/ASCVD risk reduction - on statin: yes Kidney protection - on ace or arb: yes Recent Labs    12/20/16 0808 06/04/17 0810 09/10/17 1638  HGBA1C 6.6* 7.4* 6.8*    Hypothyroidism - Pt states he is taking his levothyroxine 50 mcg in the am at least 1 hour before eating or drinking  and taking other medicines.   - He is not currently symptomatic. - He denies, fatigue, excess energy, weight changes, heart racing, heart palpitations, heat and cold intolerance, changes in hair/skin/nails, and lower leg swelling.  - He does not have any compressive symptoms to include difficulty swallowing, globus sensation, or difficulty breathing when lying flat.   Social History   Tobacco Use  . Smoking status: Never Smoker  . Smokeless tobacco: Never Used  Substance Use Topics  . Alcohol use: No  . Drug use: No    Review of Systems Per HPI unless specifically indicated above     Objective:    BP 123/67 (BP Location: Right Arm, Patient Position: Sitting, Cuff Size: Large)   Pulse 74   Temp 98.2 F (36.8 C)   Resp 12   Ht 5\' 7"  (1.702 m)   Wt 247 lb (112 kg)   BMI 38.69 kg/m   Wt Readings from Last 3 Encounters:  09/10/17 247 lb (112 kg)  04/25/17 252 lb 6.4 oz (114.5 kg)  01/22/17 247 lb 9.6 oz (112.3 kg)    Physical Exam  General - obese, well-appearing, NAD HEENT - Normocephalic, atraumatic, significant gingival hyperplasia Neck - supple, non-tender, no LAD, no thyromegaly, no carotid bruit Heart - RRR, no murmurs heard Lungs - Clear throughout all lobes, no wheezing, crackles, or rhonchi. Normal work of breathing. Abdomen - soft, rounded and obese, NTND, no masses, no hepatosplenomegaly, active bowel sounds  Extremeties - non-tender, no edema, cap refill < 2 seconds, peripheral pulses intact +2 bilaterally Skin - warm, dry Neuro -  awake, alert, oriented x3, normal gait Psych - Normal mood and affect, normal behavior   Diabetic Foot Exam - Simple   Simple Foot Form Diabetic Foot exam was performed with the following findings:  Yes 09/10/2017  4:30 PM  Visual Inspection No deformities, no ulcerations, no other skin breakdown bilaterally:  Yes Sensation Testing Intact to touch and monofilament testing bilaterally:  Yes Pulse Check Posterior Tibialis and  Dorsalis pulse intact bilaterally:  Yes Comments     Results for orders placed or performed in visit on 09/10/17  POCT glycosylated hemoglobin (Hb A1C)  Result Value Ref Range   Hemoglobin A1C 6.8 (H)       Assessment & Plan:   Problem List Items Addressed This Visit      Cardiovascular and Mediastinum   Hypertension - Primary    Controlled chronic hypertension - likely resistant considering medication need.  BP at goal. No identified complications, but is due for kidney function recheck and no recent eye exam.  Plan: 1. Continue taking amlodipine-olmesartan 5-40 mg once daily, carvedilol 25 mg twice daily, chlorthalidone 25 mg once daily. 2. Repeat CMP, lipid, TSH 3. Encouraged heart healthy diet and increasing exercise. 4. Check BP 1-2 x per week at home, keep log, and bring to clinic at next appointment. 5. Follow up 3 months.        Relevant Orders   COMPLETE METABOLIC PANEL WITH GFR     Endocrine   Hypothyroidism    Controlled on last visit.  Pt had new diagnosis after labs in June 2018 with repeat TSH in normal limits on levothyroxine 50 mcg once daily.  Pt does not endorse any current symptoms, but is still overweight and finding it hard to lose weight.  Some weight loss since last visit.  Plan: 1. Continue levothyroxine 50 mcg once daily. Discussed appropriate dosing times 2. Recheck TSH.   3. Follow up 6 months.      Relevant Orders   Lipid panel   TSH   T2DM (type 2 diabetes mellitus) ([place])    ControlledDM with A1c 6.8% improved from 7.6% on meftormin and goal A1c < 7.0%. - No known complications except hyperglycemia - Foot exam today normal. - Due for ophthalmology exam.  Plan:  1. Continue current therapy - metformin 500 mg bid 2. Encourage improved lifestyle: - low carb/low glycemic diet - Increase physical activity to 30 minutes most days of the week.  Explained that increased physical activity increases body's use of sugar for energy. 3. Check  fasting am CBG.  Bring log to next visit for review. - Provided lancet device and lancet order today.  Pt to call if meter is no longer functioning and for meter brand to supply refills.  If not able to refill, will order new meter at future date. 4. Continue ARB, Statin 5. DM Foot exam done today. Advised to schedule DM ophtho exam, send record 6. Follow-up 3 months.      Relevant Medications   Lancets 28G MISC   Lancet Devices (LANCET DEVICE WITH EJECTOR) MISC   Other Relevant Orders   POCT glycosylated hemoglobin (Hb A1C) (Completed)   COMPLETE METABOLIC PANEL WITH GFR   Lipid panel     Other   Class 2 severe obesity due to excess calories with serious comorbidity and body mass index (BMI) of 38.0 to 38.9 in adult [facility])    Obesity with hypertension and T2DM.  Pt at risk for other comorbid conditions - MI, CAD, hyperlipidemia.  Pt states has a hard time losing weight and has had this problem for a long time.  TSH check revealed hypothyroidism and is now being treated.  Small amount of weight loss in last 3 months, but pt reports recent weight loss after GI viral illness.  Plan: 1. Consider evaluation in future for sleep apnea. - Pt continues to decline today. 2. Check lipid, CMP, TSH 3. Discussed diet and exercise. 4. Followup 3 months.         Meds ordered this encounter  Medications  . Lancets 28G MISC    Sig: 1 Device by Does not apply route daily.    Dispense:  100 each    Refill:  3    Order Specific Question:   Supervising Provider    Answer:   Olin Hauser [[id number]]  . Lancet Devices (LANCET DEVICE WITH EJECTOR) MISC    Sig: 1 Device by Does not apply route daily.    Dispense:  1 each    Refill:  0    Order Specific Question:   Supervising Provider    Answer:   Olin Hauser [[id number]]   Follow up plan: Return in about 3 months (around 12/08/2017) for diabetes, blood pressure, thyroid.  [name], DNP, AGPCNP-BC Adult Gerontology Primary  Care Nurse Practitioner [facility] 09/10/2017, 5:22 PM

## 2017-09-11 ENCOUNTER — Other Ambulatory Visit: Payer: Self-pay | Admitting: Nurse Practitioner

## 2017-09-11 DIAGNOSIS — E1165 Type 2 diabetes mellitus with hyperglycemia: Secondary | ICD-10-CM

## 2017-09-11 NOTE — Telephone Encounter (Signed)
Pt said his insurance will only pay for a life scan one touch meter.  Please send for meter and strips to [facility].  His call back [phone]

## 2017-09-12 MED ORDER — BLOOD GLUCOSE MONITOR KIT: PACK | 0 refills | Status: DC

## 2017-09-13 ENCOUNTER — Other Ambulatory Visit: Payer: Managed Care, Other (non HMO)

## 2017-09-14 LAB — TSH: TSH: 3.26 mIU/L (ref 0.40–4.50)

## 2017-09-14 LAB — COMPLETE METABOLIC PANEL WITH GFR
AG Ratio: 1.3 (calc) (ref 1.0–2.5)
ALT: 47 U/L — ABNORMAL HIGH (ref 9–46)
AST: 36 U/L (ref 10–40)
Albumin: 4.1 g/dL (ref 3.6–5.1)
Alkaline phosphatase (APISO): 59 U/L (ref 40–115)
BUN: 19 mg/dL (ref 7–25)
CO2: 27 mmol/L (ref 20–32)
Calcium: 9.2 mg/dL (ref 8.6–10.3)
Chloride: 101 mmol/L (ref 98–110)
Creat: 0.93 mg/dL (ref 0.60–1.35)
GFR, Est African American: 114 mL/min/{1.73_m2} (ref >=60)
GFR, Est Non African American: 98 mL/min/{1.73_m2} (ref >=60)
Globulin: 3.2 g/dL (calc) (ref 1.9–3.7)
Glucose, Bld: 107 mg/dL — ABNORMAL HIGH (ref 65–99)
Potassium: 3.9 mmol/L (ref 3.5–5.3)
Sodium: 138 mmol/L (ref 135–146)
Total Bilirubin: 0.5 mg/dL (ref 0.2–1.2)
Total Protein: 7.3 g/dL (ref 6.1–8.1)

## 2017-09-14 LAB — LIPID PANEL
Cholesterol: 115 mg/dL (ref <200)
HDL: 26 mg/dL — ABNORMAL LOW (ref >40)
LDL Cholesterol (Calc): 61 mg/dL (calc)
Non-HDL Cholesterol (Calc): 89 mg/dL (calc) (ref <130)
Total CHOL/HDL Ratio: 4.4 (calc) (ref <5.0)
Triglycerides: 227 mg/dL — ABNORMAL HIGH (ref <150)

## 2017-09-17 ENCOUNTER — Other Ambulatory Visit: Payer: Self-pay | Admitting: Nurse Practitioner

## 2017-09-17 DIAGNOSIS — I10 Essential (primary) hypertension: Secondary | ICD-10-CM

## 2017-09-29 ENCOUNTER — Other Ambulatory Visit: Payer: Self-pay | Admitting: Nurse Practitioner

## 2017-09-29 DIAGNOSIS — I10 Essential (primary) hypertension: Secondary | ICD-10-CM

## 2017-10-21 ENCOUNTER — Other Ambulatory Visit: Payer: Self-pay

## 2017-10-21 MED ORDER — GLUCOSE BLOOD VI STRP: ORAL_STRIP | 3 refills | Status: DC

## 2017-10-24 ENCOUNTER — Ambulatory Visit: Payer: Managed Care, Other (non HMO) | Admitting: Nurse Practitioner

## 2017-11-11 ENCOUNTER — Other Ambulatory Visit: Payer: Self-pay | Admitting: Nurse Practitioner

## 2017-11-11 DIAGNOSIS — E039 Hypothyroidism, unspecified: Secondary | ICD-10-CM

## 2017-11-13 ENCOUNTER — Other Ambulatory Visit: Payer: Self-pay | Admitting: Nurse Practitioner

## 2017-12-05 ENCOUNTER — Other Ambulatory Visit: Payer: Self-pay | Admitting: Nurse Practitioner

## 2017-12-13 ENCOUNTER — Ambulatory Visit: Payer: Managed Care, Other (non HMO) | Admitting: Nurse Practitioner

## 2017-12-16 ENCOUNTER — Ambulatory Visit (INDEPENDENT_AMBULATORY_CARE_PROVIDER_SITE_OTHER): Payer: Managed Care, Other (non HMO) | Admitting: Nurse Practitioner

## 2017-12-16 ENCOUNTER — Encounter: Payer: Self-pay | Admitting: Nurse Practitioner

## 2017-12-16 ENCOUNTER — Other Ambulatory Visit: Payer: Self-pay

## 2017-12-16 VITALS — BP 98/52 | HR 72 | Temp 98.9°F | Ht 67.0 in | Wt 245.6 lb

## 2017-12-16 DIAGNOSIS — E781 Pure hyperglyceridemia: Secondary | ICD-10-CM | POA: Diagnosis not present

## 2017-12-16 DIAGNOSIS — E1165 Type 2 diabetes mellitus with hyperglycemia: Secondary | ICD-10-CM | POA: Diagnosis not present

## 2017-12-16 DIAGNOSIS — Z23 Encounter for immunization: Secondary | ICD-10-CM | POA: Diagnosis not present

## 2017-12-16 DIAGNOSIS — I10 Essential (primary) hypertension: Secondary | ICD-10-CM

## 2017-12-16 LAB — POCT GLYCOSYLATED HEMOGLOBIN (HGB A1C): Hemoglobin A1C: 6.7 % — AB (ref 4.0–5.6)

## 2017-12-16 MED ORDER — ICOSAPENT ETHYL 0.5 G PO CAPS
1.0000 g | ORAL_CAPSULE | Freq: Two times a day (BID) | ORAL | 1 refills | Status: DC
Start: 2017-12-16 — End: 2018-03-18

## 2017-12-16 MED ORDER — CARVEDILOL 25 MG PO TABS: ORAL_TABLET | ORAL | 1 refills | Status: DC

## 2017-12-16 MED ORDER — CHLORTHALIDONE 25 MG PO TABS: 12.5000 mg | ORAL_TABLET | Freq: Every day | ORAL | 1 refills | Status: DC

## 2017-12-16 NOTE — Patient Instructions (Addendum)
Charles Mathis,   Thank you for coming in to clinic today.  1. Change chlorthalidone to half tablet 12.5 mg once daily.  2. Continue all other medications without changes.  3. You will be due for FASTING BLOOD WORK.  This means you should eat no food or drink after midnight.  Drink only water or coffee without cream/sugar on the morning of your lab visit. - Please go ahead and schedule a "Lab Only" visit in the morning at the clinic for lab draw 3 days before your next office visit. - Your results will be available about 2-3 days after blood draw.  If you have set up a MyChart account, you can can log in to MyChart online to view your results and a brief explanation. Also, we can discuss your results together at your next office visit if you would like.  Please schedule a follow-up appointment with [name], AGNP. Return in about 3 months (around 03/18/2018) for diabetes, hypertension.  If you have any other questions or concerns, please feel free to call the clinic or send a message through [place]. You may also schedule an earlier appointment if necessary.  You will receive a survey after today's visit either digitally by e-mail or paper by [employer]. Your experiences and feedback matter to [place].  Please respond so we know how we are doing as we provide care for you.   [name], DNP, AGNP-BC Adult Gerontology Nurse Practitioner [place]

## 2017-12-16 NOTE — Progress Notes (Signed)
Subjective:    Patient ID: Charles Mathis, male    DOB: 14-Mar-1971, 47 y.o.   MRN: [id number]  Charles Mathis is a 47 y.o. male presenting on 12/16/2017 for Hypertension   HPI Hypertension - He is not checking BP at home or outside of clinic.    - Current medications: carvedilol 25 mg bid, amlodipine-olmesartan 5-40 mg once daily, chlorthalidone 25 mg tablet once daily and is tolerating well without side effects - He is not currently symptomatic. - Pt denies headache, lightheadedness, dizziness, changes in vision, chest tightness/pressure, palpitations, leg swelling, sudden loss of speech or loss of consciousness. - He  reports no regular exercise routine outside of his work.  He does walk occasionally with his family. - His diet is moderate in salt, moderate in fat, and moderate in carbohydrates.   Diabetes Pt presents today for follow up of Type 2 diabetes mellitus. He is checking fasting am CBG at home with a range of 100-170 - Current diabetic medications include: metformin 500 mg bid - He is not currently symptomatic.  - He denies polydipsia, polyphagia, polyuria, headaches, diaphoresis, shakiness, chills, pain, numbness or tingling in extremities and changes in vision.   - Clinical course has been stable. - Weight loss steady with additional 2 lbs in 3 months and 7 lbs in last year  PREVENTION: Eye exam current (within one year): yes - eye appointment Wed for followup Foot exam current (within one year): yes  Lipid/ASCVD risk reduction - on statin: yes Kidney protection - on ace or arb: yes Recent Labs    06/04/17 0810 09/10/17 1638 12/16/17 1701  HGBA1C 7.4* 6.8* 6.7*     Social History   Tobacco Use  . Smoking status: Never Smoker  . Smokeless tobacco: Never Used  Substance Use Topics  . Alcohol use: No  . Drug use: No    Review of Systems Per HPI unless specifically indicated above     Objective:    BP (!) 98/52   Pulse 72   Temp 98.9 F (37.2 C)  (Oral)   Ht 5\' 7"  (1.702 m)   Wt 245 lb 9.6 oz (111.4 kg)   BMI 38.47 kg/m   Wt Readings from Last 3 Encounters:  12/16/17 245 lb 9.6 oz (111.4 kg)  09/10/17 247 lb (112 kg)  04/25/17 252 lb 6.4 oz (114.5 kg)    Physical Exam  Constitutional: He is oriented to person, place, and time. He appears well-developed and well-nourished. No distress.  HENT:  Head: Normocephalic and atraumatic.  Cardiovascular: Normal rate, regular rhythm, S1 normal, S2 normal, normal heart sounds and intact distal pulses.  Pulmonary/Chest: Effort normal and breath sounds normal. No respiratory distress.  Abdominal: Soft. Bowel sounds are normal. He exhibits no distension. There is no hepatosplenomegaly. There is no tenderness. No hernia.  Neurological: He is alert and oriented to person, place, and time.  Skin: Skin is warm and dry. Capillary refill takes less than 2 seconds.  Psychiatric: He has a normal mood and affect. His behavior is normal. Judgment and thought content normal.  Vitals reviewed.   Results for orders placed or performed in visit on 12/16/17  POCT glycosylated hemoglobin (Hb A1C)  Result Value Ref Range   Hemoglobin A1C 6.7 (A) 4.0 - 5.6 %   HbA1c, POC (prediabetic range)  5.7 - 6.4 %   HbA1c, POC (controlled diabetic range)  0.0 - 7.0 %      Assessment & Plan:   Problem List  Items Addressed This Visit      Cardiovascular and Mediastinum   Hypertension Improving with BP below goal today.  REDUCE chlorthalidone to 1/2 tablet (12.5 mg total) po daily. Continue other medications without changes. Continue lifestyle improvements.  Labs last visit with stable kidney function.  Repeat in 3 months.  Followup 3 months.   Relevant Medications   chlorthalidone (HYGROTON) 25 MG tablet   Icosapent Ethyl (VASCEPA) 0.5 g CAPS   carvedilol (COREG) 25 MG tablet     Endocrine   T2DM (type 2 diabetes mellitus) ([place]) - Primary Well-controlledDM with A1c remaining at goal < 7.0%. - Complications -  hypercholesterolemia.  Plan:  1. Continue current therapy 2. Encourage improved lifestyle: - low carb/low glycemic diet reinforced prior education - Increase physical activity to 30 minutes most days of the week.  Explained that increased physical activity increases body's use of sugar for energy. 3. Check fasting am CBG and bring log to next visit for review 4. Continue ASA, ACEi and Statin 5. Foot exam up to date.  Opthalmology exam normal and on file.  No retinopathy 12/2017 6. Follow-up 3 months    Relevant Orders   POCT glycosylated hemoglobin (Hb A1C) (Completed)    Other Visit Diagnoses    Hypertriglyceridemia     Worsening on labs.  START Vascepa 2 g po bid.  Continue atorvastatin 20 mg once daily.  Patient may need increase to atorvastatin 40 mg for high intensity statin.  Repeat labs 3 months.  Followup 3 months.   Relevant Medications   chlorthalidone (HYGROTON) 25 MG tablet   Icosapent Ethyl (VASCEPA) 0.5 g CAPS   carvedilol (COREG) 25 MG tablet   Need for Tdap vaccination     Pt needs tetanus vaccine.  > 10 years since last vaccination.  Plan: 1. Reviewed tetanus disease and need for vaccination. 2. Administer vaccine today.   Relevant Orders   Tdap vaccine greater than or equal to 7yo IM (Completed)      Meds ordered this encounter  Medications  . chlorthalidone (HYGROTON) 25 MG tablet    Sig: Take 0.5 tablets (12.5 mg total) by mouth daily.    Dispense:  45 tablet    Refill:  1    Order Specific Question:   Supervising Provider    Answer:   Olin Hauser [[id number]]  . Icosapent Ethyl (VASCEPA) 0.5 g CAPS    Sig: Take 1 g by mouth 2 (two) times daily.    Dispense:  360 capsule    Refill:  1    Pt has copay card for $9 per 90 day supply.  Please contact.    Order Specific Question:   Supervising Provider    Answer:   Olin Hauser [[id number]]  . carvedilol (COREG) 25 MG tablet    Sig: TAKE 1 TABLET TWICE A DAY WITH A MEAL    Dispense:  180  tablet    Refill:  1    Order Specific Question:   Supervising Provider    Answer:   Olin Hauser [[id number]]    Follow up plan: Return in about 3 months (around 03/18/2018) for diabetes, hypertension.  [name], DNP, AGPCNP-BC Adult Gerontology Primary Care Nurse Practitioner [facility] 12/16/2017, 4:39 PM

## 2017-12-18 LAB — HM DIABETES EYE EXAM

## 2018-01-17 ENCOUNTER — Other Ambulatory Visit: Payer: Self-pay | Admitting: Nurse Practitioner

## 2018-01-17 DIAGNOSIS — E1165 Type 2 diabetes mellitus with hyperglycemia: Secondary | ICD-10-CM

## 2018-01-17 MED ORDER — LANCETS 28G MISC: 1.0000 | Freq: Every day | 3 refills | Status: DC

## 2018-01-20 ENCOUNTER — Other Ambulatory Visit: Payer: Self-pay | Admitting: Nurse Practitioner

## 2018-01-24 ENCOUNTER — Telehealth: Payer: Self-pay | Admitting: Nurse Practitioner

## 2018-01-24 NOTE — Telephone Encounter (Signed)
The prescription was corrected. Pt notified.

## 2018-01-24 NOTE — Telephone Encounter (Signed)
Marge with [facility] needs clarification on pt's lancets [phone] ref# [id number]

## 2018-02-24 ENCOUNTER — Other Ambulatory Visit: Payer: Self-pay | Admitting: Nurse Practitioner

## 2018-02-24 DIAGNOSIS — I10 Essential (primary) hypertension: Secondary | ICD-10-CM

## 2018-03-05 ENCOUNTER — Encounter: Payer: Self-pay | Admitting: Nurse Practitioner

## 2018-03-13 ENCOUNTER — Other Ambulatory Visit: Payer: Self-pay | Admitting: Nurse Practitioner

## 2018-03-13 DIAGNOSIS — E039 Hypothyroidism, unspecified: Secondary | ICD-10-CM

## 2018-03-13 DIAGNOSIS — Z6838 Body mass index (BMI) 38.0-38.9, adult: Principal | ICD-10-CM

## 2018-03-13 DIAGNOSIS — E1165 Type 2 diabetes mellitus with hyperglycemia: Secondary | ICD-10-CM

## 2018-03-13 DIAGNOSIS — I10 Essential (primary) hypertension: Secondary | ICD-10-CM

## 2018-03-14 LAB — LIPID PANEL
Cholesterol: 123 mg/dL (ref <200)
HDL: 29 mg/dL — ABNORMAL LOW (ref >40)
LDL Cholesterol (Calc): 65 mg/dL (calc)
Non-HDL Cholesterol (Calc): 94 mg/dL (calc) (ref <130)
Total CHOL/HDL Ratio: 4.2 (calc) (ref <5.0)
Triglycerides: 230 mg/dL — ABNORMAL HIGH (ref <150)

## 2018-03-14 LAB — HEMOGLOBIN A1C
Hgb A1c MFr Bld: 6.5 % of total Hgb — ABNORMAL HIGH (ref <5.7)
Mean Plasma Glucose: 140 (calc)
eAG (mmol/L): 7.7 (calc)

## 2018-03-14 LAB — COMPLETE METABOLIC PANEL WITH GFR
AG Ratio: 1.1 (calc) (ref 1.0–2.5)
ALT: 25 U/L (ref 9–46)
AST: 21 U/L (ref 10–40)
Albumin: 4.2 g/dL (ref 3.6–5.1)
Alkaline phosphatase (APISO): 62 U/L (ref 40–115)
BUN: 19 mg/dL (ref 7–25)
CO2: 25 mmol/L (ref 20–32)
Calcium: 9.7 mg/dL (ref 8.6–10.3)
Chloride: 101 mmol/L (ref 98–110)
Creat: 0.91 mg/dL (ref 0.60–1.35)
GFR, Est African American: 116 mL/min/{1.73_m2} (ref >=60)
GFR, Est Non African American: 100 mL/min/{1.73_m2} (ref >=60)
Globulin: 3.7 g/dL (calc) (ref 1.9–3.7)
Glucose, Bld: 114 mg/dL — ABNORMAL HIGH (ref 65–99)
Potassium: 4.1 mmol/L (ref 3.5–5.3)
Sodium: 137 mmol/L (ref 135–146)
Total Bilirubin: 0.4 mg/dL (ref 0.2–1.2)
Total Protein: 7.9 g/dL (ref 6.1–8.1)

## 2018-03-14 LAB — TSH: TSH: 5.25 m[IU]/L — ABNORMAL HIGH (ref 0.40–4.50)

## 2018-03-18 ENCOUNTER — Other Ambulatory Visit: Payer: Self-pay

## 2018-03-18 ENCOUNTER — Encounter: Payer: Self-pay | Admitting: Nurse Practitioner

## 2018-03-18 ENCOUNTER — Ambulatory Visit (INDEPENDENT_AMBULATORY_CARE_PROVIDER_SITE_OTHER): Payer: Managed Care, Other (non HMO) | Admitting: Nurse Practitioner

## 2018-03-18 VITALS — BP 125/64 | HR 76 | Temp 98.2°F | Ht 67.0 in | Wt 249.0 lb

## 2018-03-18 DIAGNOSIS — E785 Hyperlipidemia, unspecified: Secondary | ICD-10-CM

## 2018-03-18 DIAGNOSIS — E039 Hypothyroidism, unspecified: Secondary | ICD-10-CM | POA: Diagnosis not present

## 2018-03-18 DIAGNOSIS — E1165 Type 2 diabetes mellitus with hyperglycemia: Secondary | ICD-10-CM

## 2018-03-18 DIAGNOSIS — I10 Essential (primary) hypertension: Secondary | ICD-10-CM | POA: Diagnosis not present

## 2018-03-18 MED ORDER — CHLORTHALIDONE 25 MG PO TABS: 12.5000 mg | ORAL_TABLET | Freq: Every day | ORAL | 1 refills | Status: DC

## 2018-03-18 MED ORDER — AMLODIPINE-OLMESARTAN 5-40 MG PO TABS: 1.0000 | ORAL_TABLET | Freq: Every day | ORAL | 1 refills | Status: DC

## 2018-03-18 MED ORDER — METFORMIN HCL 500 MG PO TABS: 500.0000 mg | ORAL_TABLET | Freq: Two times a day (BID) | ORAL | 1 refills | Status: DC

## 2018-03-18 MED ORDER — LEVOTHYROXINE SODIUM 75 MCG PO TABS: 75.0000 ug | ORAL_TABLET | Freq: Every day | ORAL | 1 refills | Status: DC

## 2018-03-18 MED ORDER — CARVEDILOL 25 MG PO TABS: ORAL_TABLET | ORAL | 1 refills | Status: DC

## 2018-03-18 MED ORDER — POTASSIUM CHLORIDE CRYS ER 10 MEQ PO TBCR: 10.0000 meq | EXTENDED_RELEASE_TABLET | Freq: Every day | ORAL | 1 refills | Status: DC

## 2018-03-18 MED ORDER — ATORVASTATIN CALCIUM 40 MG PO TABS: 40.0000 mg | ORAL_TABLET | Freq: Every day | ORAL | 1 refills | Status: DC

## 2018-03-18 NOTE — Patient Instructions (Addendum)
Charles Mathis,   Thank you for coming in to clinic today.  1. Get labs drawn for thyroid only at [place] in about 6 weeks.  Call clinic to fax your orders before you go.  2. Continue all medications. - Increase levothyroxine and atorvastatin.  Please schedule a follow-up appointment with [name], AGNP. Return in about 6 months (around 09/16/2018) for diabetes, hypertension, thyroid.  If you have any other questions or concerns, please feel free to call the clinic or send a message through [place]. You may also schedule an earlier appointment if necessary.  You will receive a survey after today's visit either digitally by e-mail or paper by [employer]. Your experiences and feedback matter to [place].  Please respond so we know how we are doing as we provide care for you.   [name], DNP, AGNP-BC Adult Gerontology Nurse Practitioner [place]

## 2018-03-18 NOTE — Progress Notes (Signed)
Subjective:    Patient ID: Charles Mathis, male    DOB: 1971/01/28, 47 y.o.   MRN: [id number]  Charles Mathis is a 47 y.o. male presenting on 03/18/2018 for Diabetes and Hypertension   HPI Diabetes Pt presents today for follow up of Type 2 diabetes mellitus. He is checking fasting am CBG at home with a range of 90-200, usually 110-120 - Current diabetic medications include: metformin - He is not currently symptomatic.  - He denies polydipsia, polyphagia, polyuria, headaches, diaphoresis, shakiness, chills, pain, numbness or tingling in extremities and changes in vision.   - Clinical course has been improving. - He  reports no regular exercise routine. - His diet is moderate in salt, moderate in fat, and moderate in carbohydrates. - Weight trend: stable  PREVENTION: Eye exam current (within one year): yes Foot exam current (within one year): yes Lipid/ASCVD risk reduction - on statin: yes - low intensity Kidney protection - on ace or arb: yes - olmesartan Recent Labs    06/04/17 0810 09/10/17 1638 12/16/17 1701 03/13/18 0823  HGBA1C 7.4* 6.8* 6.7* 6.5*   Hypertension - He is not checking BP at home or outside of clinic.    - Current medications: amlodipine-olmesartan 5-40 mg once daily, carvedilol 25 mg twice daily, chlorthalidone 25mg  once daily, tolerating well without side effects - He is not currently symptomatic. - Pt denies headache, lightheadedness, dizziness, changes in vision, chest tightness/pressure, palpitations, leg swelling, sudden loss of speech or loss of consciousness.  Hyperlipidemia  He is currently taking atorvastatin 20 mg once daily without myalgias, arthralgias, or cognitive delays. - Pt denies changes in vision, chest tightness/pressure, palpitations, shortness of breath, leg pain while walking, leg or arm weakness, and sudden loss of speech or loss of consciousness.  - He was not able to continue taking fish oil for hypertriglyceridemia. Vascepa was  not covered by insurance.  Social History   Tobacco Use  . Smoking status: Never Smoker  . Smokeless tobacco: Never Used  Substance Use Topics  . Alcohol use: No  . Drug use: No    Review of Systems Per HPI unless specifically indicated above     Objective:    BP 125/64 (BP Location: Right Arm, Patient Position: Sitting, Cuff Size: Large)   Pulse 76   Temp 98.2 F (36.8 C) (Oral)   Ht 5\' 7"  (1.702 m)   Wt 249 lb (112.9 kg)   BMI 39.00 kg/m    Wt Readings from Last 3 Encounters:  03/18/18 249 lb (112.9 kg)  12/16/17 245 lb 9.6 oz (111.4 kg)  09/10/17 247 lb (112 kg)    Physical Exam  Constitutional: He is oriented to person, place, and time. He appears well-developed and well-nourished. No distress.  HENT:  Head: Normocephalic and atraumatic.  Halitosis, severe caries, gingival hyperplasia with erythema throughout mouth  Cardiovascular: Normal rate, regular rhythm, S1 normal, S2 normal, normal heart sounds and intact distal pulses.  Pulmonary/Chest: Effort normal and breath sounds normal. No respiratory distress.  Neurological: He is alert and oriented to person, place, and time.  Skin: Skin is warm and dry. Capillary refill takes less than 2 seconds.  Psychiatric: He has a normal mood and affect. His behavior is normal. Judgment and thought content normal.  Vitals reviewed.    Results for orders placed or performed in visit on 03/13/18  TSH  Result Value Ref Range   TSH 5.25 (H) 0.40 - 4.50 mIU/L  Hemoglobin A1c  Result Value Ref  Range   Hgb A1c MFr Bld 6.5 (H) <5.7 % of total Hgb   Mean Plasma Glucose 140 (calc)   eAG (mmol/L) 7.7 (calc)  COMPLETE METABOLIC PANEL WITH GFR  Result Value Ref Range   Glucose, Bld 114 (H) 65 - 99 mg/dL   BUN 19 7 - 25 mg/dL   Creat 0.91 0.60 - 1.35 mg/dL   GFR, Est Non African American 100 > OR = 60 mL/min/1.31m2   GFR, Est African American 116 > OR = 60 mL/min/1.74m2   BUN/Creatinine Ratio NOT APPLICABLE 6 - 22 (calc)    Sodium 137 135 - 146 mmol/L   Potassium 4.1 3.5 - 5.3 mmol/L   Chloride 101 98 - 110 mmol/L   CO2 25 20 - 32 mmol/L   Calcium 9.7 8.6 - 10.3 mg/dL   Total Protein 7.9 6.1 - 8.1 g/dL   Albumin 4.2 3.6 - 5.1 g/dL   Globulin 3.7 1.9 - 3.7 g/dL (calc)   AG Ratio 1.1 1.0 - 2.5 (calc)   Total Bilirubin 0.4 0.2 - 1.2 mg/dL   Alkaline phosphatase (APISO) 62 40 - 115 U/L   AST 21 10 - 40 U/L   ALT 25 9 - 46 U/L  Lipid panel  Result Value Ref Range   Cholesterol 123 <200 mg/dL   HDL 29 (L) >40 mg/dL   Triglycerides 230 (H) <150 mg/dL   LDL Cholesterol (Calc) 65 mg/dL (calc)   Total CHOL/HDL Ratio 4.2 <5.0 (calc)   Non-HDL Cholesterol (Calc) 94 <130 mg/dL (calc)      Assessment & Plan:   Problem List Items Addressed This Visit      Cardiovascular and Mediastinum   Hypertension    Controlled chronic hypertension - likely resistant considering medication need.  BP at goal. No identified complications, last kidney function WNL.  Eye exam without changes caused by uncontrolled hypertension  Plan: 1. Continue taking amlodipine-olmesartan 5-40 mg once daily, carvedilol 25 mg twice daily, chlorthalidone 25 mg once daily. 2. Labs reviewed with patient 3. Encouraged heart healthy diet and increasing exercise. 4. Check BP 1-2 x per week at home, keep log, and bring to clinic at next appointment. 5. Follow up 6 months.        Relevant Medications   atorvastatin (LIPITOR) 40 MG tablet   amLODipine-olmesartan (AZOR) 5-40 MG tablet   carvedilol (COREG) 25 MG tablet   chlorthalidone (HYGROTON) 25 MG tablet   potassium chloride (K-DUR,KLOR-CON) 10 MEQ tablet     Endocrine   Hypothyroidism    Controlled on last visit.  Currently taking levothyroxine 75 mcg once daily.  Pt does not endorse any current symptoms, but is still overweight and finding it hard to lose weight.  Some weight loss since last visit.  Plan: 1. Continue levothyroxine 75 mcg once daily. Discussed appropriate dosing  times 2. Recheck TSH.   3. Follow up 6 months.      Relevant Medications   carvedilol (COREG) 25 MG tablet   levothyroxine (SYNTHROID, LEVOTHROID) 75 MCG tablet   Other Relevant Orders   TSH + free T4   T2DM (type 2 diabetes mellitus) ([place]) - Primary    ControlledDM with A1c 6.5% improved from 6.8% on meftormin and goal A1c < 7.0%. - No known complications except hyperglycemia - Foot exam up to date. - Last opthalmology exam without retinopathy  Plan:  1. Continue current therapy - metformin 500 mg bid 2. Encourage improved lifestyle: - low carb/low glycemic diet - Increase physical activity to  30 minutes most days of the week.  Explained that increased physical activity increases body's use of sugar for energy. 3. Check fasting am CBG.  Bring log to next visit for review. 4. Continue ARB, Statin 5. Keep foot and eye exams up to date 51. Follow-up 6 months.      Relevant Medications   atorvastatin (LIPITOR) 40 MG tablet   amLODipine-olmesartan (AZOR) 5-40 MG tablet   metFORMIN (GLUCOPHAGE) 500 MG tablet     Other   Dyslipidemia    Stable on statin therapy.  Patient with dyslipidemia 2/2 T2 DM.  Continue heart healthy, low glycemic diet.  Increase physical activity to 4-5 days per week.  Followup 6 months with labs prior to visit.      Relevant Medications   atorvastatin (LIPITOR) 40 MG tablet      Meds ordered this encounter  Medications  . atorvastatin (LIPITOR) 40 MG tablet    Sig: Take 1 tablet (40 mg total) by mouth daily.    Dispense:  90 tablet    Refill:  1    Order Specific Question:   Supervising Provider    Answer:   Olin Hauser [[id number]]  . amLODipine-olmesartan (AZOR) 5-40 MG tablet    Sig: Take 1 tablet by mouth daily.    Dispense:  90 tablet    Refill:  1    Order Specific Question:   Supervising Provider    Answer:   Olin Hauser [[id number]]  . carvedilol (COREG) 25 MG tablet    Sig: TAKE 1 TABLET TWICE A DAY WITH A MEAL     Dispense:  180 tablet    Refill:  1    Order Specific Question:   Supervising Provider    Answer:   Olin Hauser [[id number]]  . chlorthalidone (HYGROTON) 25 MG tablet    Sig: Take 0.5 tablets (12.5 mg total) by mouth daily.    Dispense:  45 tablet    Refill:  1    Order Specific Question:   Supervising Provider    Answer:   Olin Hauser [[id number]]  . levothyroxine (SYNTHROID, LEVOTHROID) 75 MCG tablet    Sig: Take 1 tablet (75 mcg total) by mouth daily.    Dispense:  90 tablet    Refill:  1    Order Specific Question:   Supervising Provider    Answer:   Olin Hauser [[id number]]  . metFORMIN (GLUCOPHAGE) 500 MG tablet    Sig: Take 1 tablet (500 mg total) by mouth 2 (two) times daily with a meal.    Dispense:  180 tablet    Refill:  1    Order Specific Question:   Supervising Provider    Answer:   Olin Hauser [[id number]]  . potassium chloride (K-DUR,KLOR-CON) 10 MEQ tablet    Sig: Take 1 tablet (10 mEq total) by mouth daily.    Dispense:  90 tablet    Refill:  1    Order Specific Question:   Supervising Provider    Answer:   Olin Hauser [[id number]]    Follow up plan: Return in about 6 months (around 09/16/2018) for diabetes, hypertension, thyroid.  [name], DNP, AGPCNP-BC Adult Gerontology Primary Care Nurse Practitioner [facility] 03/18/2018, 3:24 PM

## 2018-03-24 ENCOUNTER — Encounter: Payer: Self-pay | Admitting: Nurse Practitioner

## 2018-03-24 DIAGNOSIS — E785 Hyperlipidemia, unspecified: Secondary | ICD-10-CM | POA: Insufficient documentation

## 2018-03-24 DIAGNOSIS — E1169 Type 2 diabetes mellitus with other specified complication: Secondary | ICD-10-CM | POA: Insufficient documentation

## 2018-03-24 NOTE — Assessment & Plan Note (Signed)
Stable on statin therapy.  Patient with dyslipidemia 2/2 T2 DM.  Continue heart healthy, low glycemic diet.  Increase physical activity to 4-5 days per week.  Followup 6 months with labs prior to visit.

## 2018-03-24 NOTE — Assessment & Plan Note (Signed)
Controlled on last visit.  Currently taking levothyroxine 75 mcg once daily.  Pt does not endorse any current symptoms, but is still overweight and finding it hard to lose weight.  Some weight loss since last visit.  Plan: 1. Continue levothyroxine 75 mcg once daily. Discussed appropriate dosing times 2. Recheck TSH.   3. Follow up 6 months.

## 2018-03-24 NOTE — Assessment & Plan Note (Signed)
Controlled chronic hypertension - likely resistant considering medication need.  BP at goal. No identified complications, last kidney function WNL.  Eye exam without changes caused by uncontrolled hypertension  Plan: 1. Continue taking amlodipine-olmesartan 5-40 mg once daily, carvedilol 25 mg twice daily, chlorthalidone 25 mg once daily. 2. Labs reviewed with patient 3. Encouraged heart healthy diet and increasing exercise. 4. Check BP 1-2 x per week at home, keep log, and bring to clinic at next appointment. 5. Follow up 6 months.

## 2018-03-24 NOTE — Assessment & Plan Note (Addendum)
ControlledDM with A1c 6.5% improved from 6.8% on meftormin and goal A1c < 7.0%. - No known complications except hyperglycemia - Foot exam up to date. - Last opthalmology exam without retinopathy  Plan:  1. Continue current therapy - metformin 500 mg bid 2. Encourage improved lifestyle: - low carb/low glycemic diet - Increase physical activity to 30 minutes most days of the week.  Explained that increased physical activity increases body's use of sugar for energy. 3. Check fasting am CBG.  Bring log to next visit for review. 4. Continue ARB, Statin 5. Keep foot and eye exams up to date 61. Follow-up 6 months.

## 2018-04-30 ENCOUNTER — Telehealth: Payer: Self-pay | Admitting: Nurse Practitioner

## 2018-04-30 NOTE — Telephone Encounter (Signed)
Pt is going to [facility] in [place] today for labs.  Please fax order

## 2018-06-26 ENCOUNTER — Encounter: Payer: Self-pay | Admitting: Nurse Practitioner

## 2018-06-26 ENCOUNTER — Other Ambulatory Visit: Payer: Self-pay

## 2018-06-26 ENCOUNTER — Ambulatory Visit (INDEPENDENT_AMBULATORY_CARE_PROVIDER_SITE_OTHER): Payer: Managed Care, Other (non HMO) | Admitting: Nurse Practitioner

## 2018-06-26 VITALS — BP 122/70 | HR 97 | Temp 98.5°F | Ht 67.0 in | Wt 255.0 lb

## 2018-06-26 DIAGNOSIS — J0140 Acute pansinusitis, unspecified: Secondary | ICD-10-CM

## 2018-06-26 MED ORDER — AMOXICILLIN-POT CLAVULANATE 875-125 MG PO TABS: 1.0000 | ORAL_TABLET | Freq: Two times a day (BID) | ORAL | 0 refills | Status: AC

## 2018-06-26 NOTE — Progress Notes (Signed)
Subjective:    Patient ID: Charles Mathis, male    DOB: 11/12/1970, 47 y.o.   MRN: [id number]  Charles Mathis is a 47 y.o. male presenting on 06/26/2018 for Cough (persistent cough x 1 mth )   HPI URI symptoms with cough Patient has had a cough for last 1 month.  Has continued runny nose, sinus pressure/headache since initial cold about 1 month ago.  No fevers, chills, or sweats.  - Cough is occasionally productive.   - Feels sensation of something stuck in throat.  Sides hurt sometimes with violent cough.  Is able to sleep well at this time. - denies fever, chills, or sweats, nausea, vomiting, or diarrhea   Social History   Tobacco Use  . Smoking status: Never Smoker  . Smokeless tobacco: Never Used  Substance Use Topics  . Alcohol use: No  . Drug use: No    Review of Systems Per HPI unless specifically indicated above     Objective:    BP 122/70 (BP Location: Left Arm, Patient Position: Sitting, Cuff Size: Large)   Pulse 97   Temp 98.5 F (36.9 C) (Oral)   Ht 5\' 7"  (1.702 m)   Wt 255 lb 0.2 oz (115.7 kg)   BMI 39.94 kg/m   Wt Readings from Last 3 Encounters:  06/26/18 255 lb 0.2 oz (115.7 kg)  03/18/18 249 lb (112.9 kg)  12/16/17 245 lb 9.6 oz (111.4 kg)    Physical Exam Vitals signs reviewed.  Constitutional:      General: He is not in acute distress.    Appearance: He is well-developed.  HENT:     Head: Normocephalic and atraumatic.     Right Ear: Tympanic membrane, ear canal and external ear normal.     Left Ear: Tympanic membrane, ear canal and external ear normal.     Nose: Congestion and rhinorrhea present.     Right Turbinates: Swollen.     Left Turbinates: Swollen.     Right Sinus: Maxillary sinus tenderness and frontal sinus tenderness present.     Left Sinus: Maxillary sinus tenderness and frontal sinus tenderness present.     Mouth/Throat:     Lips: Pink.     Mouth: Mucous membranes are dry.     Dentition: Abnormal dentition. Dental  caries present.     Pharynx: Oropharynx is clear. No pharyngeal swelling or posterior oropharyngeal erythema (mildly injected).     Tonsils: Swelling: 0 on the right. 0 on the left.  Neck:     Musculoskeletal: Normal range of motion and neck supple.  Cardiovascular:     Rate and Rhythm: Normal rate and regular rhythm.     Heart sounds: Normal heart sounds, S1 normal and S2 normal.  Pulmonary:     Effort: Pulmonary effort is normal. No respiratory distress.     Breath sounds: Normal breath sounds.  Lymphadenopathy:     Cervical: No cervical adenopathy.  Skin:    General: Skin is warm and dry.  Neurological:     Mental Status: He is alert and oriented to person, place, and time.  Psychiatric:        Behavior: Behavior normal.    Results for orders placed or performed in visit on 03/13/18  TSH  Result Value Ref Range   TSH 5.25 (H) 0.40 - 4.50 mIU/L  Hemoglobin A1c  Result Value Ref Range   Hgb A1c MFr Bld 6.5 (H) <5.7 % of total Hgb   Mean Plasma Glucose  140 (calc)   eAG (mmol/L) 7.7 (calc)  COMPLETE METABOLIC PANEL WITH GFR  Result Value Ref Range   Glucose, Bld 114 (H) 65 - 99 mg/dL   BUN 19 7 - 25 mg/dL   Creat 0.91 0.60 - 1.35 mg/dL   GFR, Est Non African American 100 > OR = 60 mL/min/1.24m2   GFR, Est African American 116 > OR = 60 mL/min/1.67m2   BUN/Creatinine Ratio NOT APPLICABLE 6 - 22 (calc)   Sodium 137 135 - 146 mmol/L   Potassium 4.1 3.5 - 5.3 mmol/L   Chloride 101 98 - 110 mmol/L   CO2 25 20 - 32 mmol/L   Calcium 9.7 8.6 - 10.3 mg/dL   Total Protein 7.9 6.1 - 8.1 g/dL   Albumin 4.2 3.6 - 5.1 g/dL   Globulin 3.7 1.9 - 3.7 g/dL (calc)   AG Ratio 1.1 1.0 - 2.5 (calc)   Total Bilirubin 0.4 0.2 - 1.2 mg/dL   Alkaline phosphatase (APISO) 62 40 - 115 U/L   AST 21 10 - 40 U/L   ALT 25 9 - 46 U/L  Lipid panel  Result Value Ref Range   Cholesterol 123 <200 mg/dL   HDL 29 (L) >40 mg/dL   Triglycerides 230 (H) <150 mg/dL   LDL Cholesterol (Calc) 65 mg/dL (calc)    Total CHOL/HDL Ratio 4.2 <5.0 (calc)   Non-HDL Cholesterol (Calc) 94 <130 mg/dL (calc)      Assessment & Plan:   Problem List Items Addressed This Visit    None    Visit Diagnoses    Acute non-recurrent pansinusitis    -  Primary   Relevant Medications   amoxicillin-clavulanate (AUGMENTIN) 875-125 MG tablet    Consistent with URI and secondary sinusitis with symptoms worsening over the past 7 days and initial symptoms of nasal congestion and sinus pressure over 2 weeks ago.   Plan: 1.START taking AUGMENTIN 875-125 mg tablets every 12 hours for 10 days.  Discussed completing antibiotic. - While on antibiotic, take a probiotic OTC or from food. - Continue anti-histamine loratadine 10mg  daily. - Can use Flonase 2 sprays each nostril daily for up to 4-6 weeks if no epistaxis. - Start Mucinex-DM OTC for  7-10 days prn congestion 2. Supportive care with nasal saline, warm herbal tea with honey, 3. Improve hydration 4. Tylenol / Motrin PRN fevers  5. Return criteria given   Meds ordered this encounter  Medications  . amoxicillin-clavulanate (AUGMENTIN) 875-125 MG tablet    Sig: Take 1 tablet by mouth 2 (two) times daily for 10 days.    Dispense:  20 tablet    Refill:  0    Order Specific Question:   Supervising Provider    Answer:   Olin Hauser [[id number]]    Follow up plan: Return 1-2 weeks if symptoms worsen or fail to improve.  [name], DNP, AGPCNP-BC Adult Gerontology Primary Care Nurse Practitioner [facility] 06/26/2018, 4:27 PM

## 2018-06-26 NOTE — Patient Instructions (Addendum)
Charles Mathis,   Thank you for coming in to clinic today.  1. It sounds like you have a Bacterial Sinus infection.  Recommend good hand washing. - START Augmentin 875-125 mg one tablet every 12 hours for 10 days.  Make sure to take all doses of your antibiotic. - While you are on an antibiotic, take a probiotic.  Antibiotics kill good and bad bacteria.  A probiotic helps to replace your good bacteria. Probiotic pills can be found over the counter.  One brand is Florastor, but you can use any brand you prefer.  You can also get good bacteria from foods.  These foods are yogurt, kefir, kombucha, and fresh, refrigerated and uncooked sauerkraut. - Drink plenty of fluids.  - Continue anti-histamine loratadine or cetirizine 10mg  daily. - You may also use Flonase 2 sprays each nostril daily for up to 4-6 weeks  Other over the counter medications you may try, if needed for symptoms are: - If congestion is worse, start OTC Mucinex (or may try Mucinex-DM for cough) up to 7-10 days then stop - You may try over the counter Nasal Saline spray (Simply Saline, Ocean Spray) as needed to reduce congestion. - Start taking Tylenol extra strength 1 to 2 tablets every 6-8 hours for aches or fever/chills for next few days as needed.  Do not take more than 3,000 mg in 24 hours from all medicines.   - Drink warm herbal tea with honey for sore throat.   If symptoms are significantly worse with persistent fevers/chills despite tylenol/ibpurofen, nausea, vomiting unable to tolerate food/fluids or medicine, body aches, or shortness of breath, sinus pain pressure or worsening productive cough, then follow-up for re-evaluation, may seek more immediate care at Urgent Care or the ED if you are more concerned that it is an emergency.  Please schedule a follow-up appointment with [name], AGNP. Return 1-2 weeks if symptoms worsen or fail to improve.  If you have any other questions or concerns, please feel free to call  the clinic or send a message through [facility]. You may also schedule an earlier appointment if necessary.  You will receive a survey after today's visit either digitally by e-mail or paper by [employer]. Your experiences and feedback matter to [place].  Please respond so we know how we are doing as we provide care for you.   [name], DNP, AGNP-BC Adult Gerontology Nurse Practitioner [place]

## 2018-06-29 ENCOUNTER — Encounter: Payer: Self-pay | Admitting: Nurse Practitioner

## 2018-09-16 ENCOUNTER — Ambulatory Visit: Payer: Managed Care, Other (non HMO) | Admitting: Nurse Practitioner

## 2018-10-22 ENCOUNTER — Encounter: Payer: Self-pay | Admitting: Nurse Practitioner

## 2018-10-22 DIAGNOSIS — I10 Essential (primary) hypertension: Secondary | ICD-10-CM

## 2018-10-22 DIAGNOSIS — E785 Hyperlipidemia, unspecified: Secondary | ICD-10-CM

## 2018-10-22 DIAGNOSIS — E039 Hypothyroidism, unspecified: Secondary | ICD-10-CM

## 2018-10-22 DIAGNOSIS — E1165 Type 2 diabetes mellitus with hyperglycemia: Secondary | ICD-10-CM

## 2018-10-23 MED ORDER — CHLORTHALIDONE 25 MG PO TABS: 12.5000 mg | ORAL_TABLET | Freq: Every day | ORAL | 0 refills | Status: DC

## 2018-10-23 MED ORDER — CARVEDILOL 25 MG PO TABS: ORAL_TABLET | ORAL | 0 refills | Status: DC

## 2018-10-23 MED ORDER — POTASSIUM CHLORIDE CRYS ER 10 MEQ PO TBCR: 10.0000 meq | EXTENDED_RELEASE_TABLET | Freq: Every day | ORAL | 0 refills | Status: DC

## 2018-10-23 MED ORDER — ATORVASTATIN CALCIUM 40 MG PO TABS: 40.0000 mg | ORAL_TABLET | Freq: Every day | ORAL | 0 refills | Status: DC

## 2018-10-23 MED ORDER — LEVOTHYROXINE SODIUM 75 MCG PO TABS: 75.0000 ug | ORAL_TABLET | Freq: Every day | ORAL | 0 refills | Status: DC

## 2018-10-23 MED ORDER — METFORMIN HCL 500 MG PO TABS: 500.0000 mg | ORAL_TABLET | Freq: Two times a day (BID) | ORAL | 0 refills | Status: DC

## 2018-10-23 MED ORDER — AMLODIPINE-OLMESARTAN 5-40 MG PO TABS: 1.0000 | ORAL_TABLET | Freq: Every day | ORAL | 0 refills | Status: DC

## 2018-10-31 ENCOUNTER — Other Ambulatory Visit: Payer: Self-pay

## 2018-10-31 ENCOUNTER — Ambulatory Visit (INDEPENDENT_AMBULATORY_CARE_PROVIDER_SITE_OTHER): Payer: Self-pay | Admitting: Nurse Practitioner

## 2018-10-31 ENCOUNTER — Ambulatory Visit: Payer: Self-pay

## 2018-10-31 ENCOUNTER — Encounter: Payer: Self-pay | Admitting: Nurse Practitioner

## 2018-10-31 DIAGNOSIS — E039 Hypothyroidism, unspecified: Secondary | ICD-10-CM

## 2018-10-31 DIAGNOSIS — I10 Essential (primary) hypertension: Secondary | ICD-10-CM

## 2018-10-31 DIAGNOSIS — E785 Hyperlipidemia, unspecified: Secondary | ICD-10-CM

## 2018-10-31 DIAGNOSIS — E1165 Type 2 diabetes mellitus with hyperglycemia: Secondary | ICD-10-CM

## 2018-10-31 MED ORDER — CARVEDILOL 25 MG PO TABS: ORAL_TABLET | ORAL | 2 refills | Status: DC

## 2018-10-31 MED ORDER — AMLODIPINE-OLMESARTAN 5-40 MG PO TABS: 1.0000 | ORAL_TABLET | Freq: Every day | ORAL | 2 refills | Status: DC

## 2018-10-31 MED ORDER — LEVOTHYROXINE SODIUM 75 MCG PO TABS: 75.0000 ug | ORAL_TABLET | Freq: Every day | ORAL | 2 refills | Status: DC

## 2018-10-31 MED ORDER — METFORMIN HCL 500 MG PO TABS: 500.0000 mg | ORAL_TABLET | Freq: Two times a day (BID) | ORAL | 2 refills | Status: DC

## 2018-10-31 MED ORDER — CHLORTHALIDONE 25 MG PO TABS: 12.5000 mg | ORAL_TABLET | Freq: Every day | ORAL | 2 refills | Status: DC

## 2018-10-31 MED ORDER — ATORVASTATIN CALCIUM 40 MG PO TABS: 40.0000 mg | ORAL_TABLET | Freq: Every day | ORAL | 2 refills | Status: DC

## 2018-10-31 NOTE — Progress Notes (Signed)
Telemedicine Encounter: Disclosed to patient at start of encounter that we will provide appropriate telemedicine services.  Patient consents to be treated via phone prior to discussion. - Patient is at his home and is accessed via telephone. - Services are provided by [name] from [facility].  Subjective:    Patient ID: Charles Mathis, male    DOB: 12-17-1970, 48 y.o.   MRN: [id number]  BRYNDON CUMBIE is a 48 y.o. male presenting on 10/31/2018 for Hypertension (pt is uninsuranced, and have not been able to get his labs. ) and Diabetes  HPI  Hypertension - He is checking BP at home or outside of clinic.  Readings 133/72. - Current medications: amlodipine-olmesartan 5-40 mg once daily; carvedilol 25 mg bid, chlorthalidone 12.5 mg daily, tolerating well without side effects - He is not currently symptomatic. - Pt denies headache, lightheadedness, dizziness, changes in vision, chest tightness/pressure, palpitations, leg swelling, sudden loss of speech or loss of consciousness.  Diabetes Pt presents today for follow up of Type 2 diabetes mellitus. He is checking fasting am CBG at home with a range of 100-150 depending on what he has eaten - Current diabetic medications include: metformin - He is not currently symptomatic.  - He denies polydipsia, polyphagia, polyuria, headaches, diaphoresis, shakiness, chills, pain, numbness or tingling in extremities and changes in vision.   - Clinical course has been unchanged. - He  reports an exercise routine that includes walking, 4-5 days per week. - His diet is moderate in salt, moderate in fat, and moderate in carbohydrates. - Weight trend: stable  PREVENTION: Eye exam current (within one year): no Foot exam current (within one year): no  Lipid/ASCVD risk reduction - on statin: atorvastatin 40 mg once daily Kidney protection - on ace or arb: olmesartan Recent Labs    12/16/17 1701 03/13/18 0823  HGBA1C 6.7* 6.5*   Social History   Tobacco Use  . Smoking status: Never Smoker  . Smokeless tobacco: Never Used  Substance Use Topics  . Alcohol use: No  . Drug use: No    Review of Systems Per HPI unless specifically indicated above     Objective:    There were no vitals taken for this visit.  Wt Readings from Last 3 Encounters:  06/26/18 255 lb 0.2 oz (115.7 kg)  03/18/18 249 lb (112.9 kg)  12/16/17 245 lb 9.6 oz (111.4 kg)    Physical Exam Patient remotely monitored.  Verbal communication appropriate.  Cognition normal.   Results for orders placed or performed in visit on 03/13/18  TSH  Result Value Ref Range   TSH 5.25 (H) 0.40 - 4.50 mIU/L  Hemoglobin A1c  Result Value Ref Range   Hgb A1c MFr Bld 6.5 (H) <5.7 % of total Hgb   Mean Plasma Glucose 140 (calc)   eAG (mmol/L) 7.7 (calc)  COMPLETE METABOLIC PANEL WITH GFR  Result Value Ref Range   Glucose, Bld 114 (H) 65 - 99 mg/dL   BUN 19 7 - 25 mg/dL   Creat 0.91 0.60 - 1.35 mg/dL   GFR, Est Non African American 100 > OR = 60 mL/min/1.47m2   GFR, Est African American 116 > OR = 60 mL/min/1.14m2   BUN/Creatinine Ratio NOT APPLICABLE 6 - 22 (calc)   Sodium 137 135 - 146 mmol/L   Potassium 4.1 3.5 - 5.3 mmol/L   Chloride 101 98 - 110 mmol/L   CO2 25 20 - 32 mmol/L   Calcium 9.7 8.6 -  10.3 mg/dL   Total Protein 7.9 6.1 - 8.1 g/dL   Albumin 4.2 3.6 - 5.1 g/dL   Globulin 3.7 1.9 - 3.7 g/dL (calc)   AG Ratio 1.1 1.0 - 2.5 (calc)   Total Bilirubin 0.4 0.2 - 1.2 mg/dL   Alkaline phosphatase (APISO) 62 40 - 115 U/L   AST 21 10 - 40 U/L   ALT 25 9 - 46 U/L  Lipid panel  Result Value Ref Range   Cholesterol 123 <200 mg/dL   HDL 29 (L) >40 mg/dL   Triglycerides 230 (H) <150 mg/dL   LDL Cholesterol (Calc) 65 mg/dL (calc)   Total CHOL/HDL Ratio 4.2 <5.0 (calc)   Non-HDL Cholesterol (Calc) 94 <130 mg/dL (calc)      Assessment & Plan:   Problem List Items Addressed This Visit      Cardiovascular and Mediastinum   Hypertension  Stable today on remote evaluation.  Medications tolerated without side effects.  Continue at current doses.  Refills provided.  Check labs ASAP financially. Followup 3 months.      Endocrine   Hypothyroidism Stable today on remote evaluation.  Medications tolerated without side effects.  Continue at current doses.  Refills provided.  Check labs ASAP financially. Followup 3 months.     T2DM (type 2 diabetes mellitus) ([place]) Stable today on remote evaluation.  Medications tolerated without side effects.  Continue at current doses.  Refills provided.  Check labs ASAP.  Come to clinic for POCT A1c testing next week. Followup 3 months.      Other   Dyslipidemia Stable today on remote evaluation.  Medications tolerated without side effects.  Continue at current doses.  Refills provided.  Check labs ASAP financially. Followup 3 months.       Meds ordered this encounter  Medications  . amLODipine-olmesartan (AZOR) 5-40 MG tablet    Sig: Take 1 tablet by mouth daily.    Dispense:  30 tablet    Refill:  2       . atorvastatin (LIPITOR) 40 MG tablet    Sig: Take 1 tablet (40 mg total) by mouth daily.    Dispense:  30 tablet    Refill:  2       . carvedilol (COREG) 25 MG tablet    Sig: TAKE 1 TABLET TWICE A DAY WITH A MEAL    Dispense:  30 tablet    Refill:  2       . levothyroxine (SYNTHROID) 75 MCG tablet    Sig: Take 1 tablet (75 mcg total) by mouth daily.    Dispense:  30 tablet    Refill:  2       . metFORMIN (GLUCOPHAGE) 500 MG tablet    Sig: Take 1 tablet (500 mg total) by mouth 2 (two) times daily with a meal.    Dispense:  30 tablet    Refill:  2       . chlorthalidone (HYGROTON) 25 MG tablet    Sig: Take 0.5 tablets (12.5 mg total) by mouth daily.    Dispense:  15 tablet    Refill:  2         Order Specific Question:   Supervising Provider    Answer:   Olin Hauser [[id number]]   - Time spent in direct consultation with patient via telemedicine about above  concerns: 9 minutes  Follow up plan: Return in about 3 months (around 01/30/2019) for diabetes, hypertension.  [name], DNP, AGPCNP-BC Adult Gerontology  Primary Care Nurse Practitioner [facility] 10/31/2018, 2:30 PM

## 2018-11-07 ENCOUNTER — Other Ambulatory Visit (INDEPENDENT_AMBULATORY_CARE_PROVIDER_SITE_OTHER): Payer: Self-pay

## 2018-11-07 ENCOUNTER — Other Ambulatory Visit: Payer: Self-pay

## 2018-11-07 ENCOUNTER — Ambulatory Visit: Payer: Self-pay

## 2018-11-07 DIAGNOSIS — E1165 Type 2 diabetes mellitus with hyperglycemia: Secondary | ICD-10-CM

## 2018-11-07 LAB — POCT GLYCOSYLATED HEMOGLOBIN (HGB A1C): Hemoglobin A1C: 6.8 % — AB (ref 4.0–5.6)

## 2018-12-08 ENCOUNTER — Other Ambulatory Visit: Payer: Self-pay | Admitting: Nurse Practitioner

## 2018-12-08 DIAGNOSIS — I10 Essential (primary) hypertension: Secondary | ICD-10-CM

## 2018-12-19 ENCOUNTER — Other Ambulatory Visit: Payer: Self-pay

## 2018-12-19 DIAGNOSIS — E1165 Type 2 diabetes mellitus with hyperglycemia: Secondary | ICD-10-CM

## 2018-12-19 DIAGNOSIS — I10 Essential (primary) hypertension: Secondary | ICD-10-CM

## 2018-12-19 MED ORDER — CARVEDILOL 25 MG PO TABS: ORAL_TABLET | ORAL | 2 refills | Status: DC

## 2018-12-19 MED ORDER — METFORMIN HCL 500 MG PO TABS: 500.0000 mg | ORAL_TABLET | Freq: Two times a day (BID) | ORAL | 2 refills | Status: DC

## 2019-01-02 ENCOUNTER — Ambulatory Visit: Payer: Self-pay | Admitting: Pharmacy Technician

## 2019-01-02 ENCOUNTER — Other Ambulatory Visit: Payer: Self-pay

## 2019-01-02 DIAGNOSIS — Z79899 Other long term (current) drug therapy: Secondary | ICD-10-CM

## 2019-01-02 NOTE — Progress Notes (Signed)
Met with patient completed financial assistance application for [facility].  Patient agreed to be responsible for gathering financial information and forwarding to appropriate department in [facility]    Completed [facility] application and contract with patient over-the-phone.  Patient verbally agreed to all terms of the [facility] contract.  Patient to come into [facility] on 01/05/19 to sign.    Patient approved to receive medication assistance as long as eligibility criteria continues to be met.    Provided patient with Civil engineer, contracting based on his particular needs.    Referred patient to [facility].  Patient stated that he preferred to continue to see Dr. Merrilyn Puma at [facility].  [place] [facility]

## 2019-01-05 ENCOUNTER — Other Ambulatory Visit: Payer: Self-pay

## 2019-01-05 DIAGNOSIS — E785 Hyperlipidemia, unspecified: Secondary | ICD-10-CM

## 2019-01-05 DIAGNOSIS — E039 Hypothyroidism, unspecified: Secondary | ICD-10-CM

## 2019-01-05 DIAGNOSIS — E1165 Type 2 diabetes mellitus with hyperglycemia: Secondary | ICD-10-CM

## 2019-01-05 DIAGNOSIS — I10 Essential (primary) hypertension: Secondary | ICD-10-CM

## 2019-01-05 MED ORDER — POTASSIUM CHLORIDE CRYS ER 10 MEQ PO TBCR: 10.0000 meq | EXTENDED_RELEASE_TABLET | Freq: Every day | ORAL | 0 refills | Status: DC

## 2019-01-05 MED ORDER — ATORVASTATIN CALCIUM 40 MG PO TABS: 40.0000 mg | ORAL_TABLET | Freq: Every day | ORAL | 2 refills | Status: DC

## 2019-01-05 MED ORDER — AMLODIPINE-OLMESARTAN 5-40 MG PO TABS: 1.0000 | ORAL_TABLET | Freq: Every day | ORAL | 0 refills | Status: DC

## 2019-01-05 MED ORDER — LEVOTHYROXINE SODIUM 75 MCG PO TABS: 75.0000 ug | ORAL_TABLET | Freq: Every day | ORAL | 0 refills | Status: DC

## 2019-01-05 MED ORDER — CARVEDILOL 25 MG PO TABS: ORAL_TABLET | ORAL | 0 refills | Status: DC

## 2019-01-05 MED ORDER — CHLORTHALIDONE 25 MG PO TABS: 12.5000 mg | ORAL_TABLET | Freq: Every day | ORAL | 0 refills | Status: DC

## 2019-01-05 MED ORDER — METFORMIN HCL 500 MG PO TABS: 500.0000 mg | ORAL_TABLET | Freq: Two times a day (BID) | ORAL | 0 refills | Status: DC

## 2019-01-05 NOTE — Telephone Encounter (Signed)
Pt recently approved for financial assistance through medication management. He needs all prescriptions sent tot his pharmacy.

## 2019-01-09 ENCOUNTER — Ambulatory Visit: Payer: Self-pay | Admitting: Pharmacist

## 2019-01-09 ENCOUNTER — Encounter: Payer: Self-pay | Admitting: Pharmacist

## 2019-01-09 DIAGNOSIS — Z79899 Other long term (current) drug therapy: Secondary | ICD-10-CM

## 2019-01-09 NOTE — Progress Notes (Addendum)
[facility] Visit Note  Patient: Charles Mathis MRN: [id number] Date of Birth: 1971/01/29 PCP: Mikey College, NP   Charles Mathis 48 y.o. male presents for a 01/09/19 visit today. Visit was conducted over the phone due to COVID-19 and therefore, no vitals were taken. Identity confirmed with 2 identifiers.  There were no vitals taken for this visit.  Patient Information   Past Medical History:  Diagnosis Date  . Allergy   . Hypertension       Past Surgical History:  Procedure Laterality Date  . NO PAST SURGERIES       Family History  Problem Relation Age of Onset  . Diabetes Mother   . Heart disease Mother   . Mental illness Mother   . Thyroid disease Mother   . Heart disease Father   . Diabetes Father   . Mental illness Father     New Diagnoses (since last visit): None  Family Support: Good  Lifestyle Diet: Breakfast: English muffin or a [place] sausage biscuit from [place]  Lunch: usually leftovers from the night before for dinner Dinner usually skips and eats a snack closer to bed time. Snacks: cereal before bed Drinks:orange juice with breakfast. No coffee with an occasional soda every now and then.  Exercise: Walks around the block every other day and tries to walk for at least 30 minutes. Advised patient to aim for ~168min per week of moderate intensity exercise.           Social History   Substance and Sexual Activity  Alcohol Use No      Social History   Tobacco Use  Smoking Status Never Smoker  Smokeless Tobacco Never Used      Health Maintenance  Topic Date Due  . PNEUMOCOCCAL POLYSACCHARIDE VACCINE AGE 63-64 HIGH RISK  02/28/1973  . HIV Screening  02/28/1986  . FOOT EXAM  09/10/2018  . OPHTHALMOLOGY EXAM  12/19/2018  . INFLUENZA VACCINE  02/14/2019  . HEMOGLOBIN A1C  05/09/2019  . TETANUS/TDAP  12/17/2027   Health Maintenance/Date Completed  Last ED visit: 2018 Last Visit to PCP: phone visit in  april Next Visit to PCP: 01/30/19 Dental Exam: 2019 Eye Exam: last year Prostate Exam: not yet Colonoscopy: not yet Flu Vaccine: either last year or the year before from [facility]  Outpatient Encounter Medications as of 01/09/2019  Medication Sig  . amLODipine-olmesartan (AZOR) 5-40 MG tablet Take 1 tablet by mouth daily.  Marland Kitchen aspirin EC 81 MG tablet Take 81 mg by mouth daily.  Marland Kitchen atorvastatin (LIPITOR) 40 MG tablet Take 1 tablet (40 mg total) by mouth daily.  . carvedilol (COREG) 25 MG tablet TAKE 1 TABLET TWICE A DAY WITH A MEAL  . cetirizine (ZYRTEC) 10 MG tablet Take 10 mg by mouth daily.  . chlorthalidone (HYGROTON) 25 MG tablet Take 0.5 tablets (12.5 mg total) by mouth daily.  . fluticasone (FLONASE) 50 MCG/ACT nasal spray Place 2 sprays into both nostrils daily.  Marland Kitchen levothyroxine (SYNTHROID) 75 MCG tablet Take 1 tablet (75 mcg total) by mouth daily.  . metFORMIN (GLUCOPHAGE) 500 MG tablet Take 1 tablet (500 mg total) by mouth 2 (two) times daily with a meal.  . potassium chloride (K-DUR) 10 MEQ tablet Take 1 tablet (10 mEq total) by mouth daily.   No facility-administered encounter medications on file as of 01/09/2019.     Assessment and Plan:  Medication Adherence/Access: Patient has issue obtaining medications a few months back due to losing his job,  but has gotten back on track and only missed a few doses due to trying to preserve what he had. Patient knew what majority of his medications were for and how to take. Needed a [place] help on two of them (atorvastatin and carvedilol) regarding their indication. Besides that, patient expressed good understanding of his medications and reports no issues with any AEs. Advised patient to avoid snacking before bed.   Hypertension - Azor, Carvedilol, Chlorthalidone. Patient reports home BP readings have been "good". Due to COVID-19, BP was not assessed in clinic (visit done over phone). Patient reports no signs of swelling or any episode of  orthostatic hypotension.   Diabetes - Metformin A1c: 6.8 (11/07/18), 6.5 (03/13/18), 6.7 (12/16/17) Patient A1c at goal (<7% per ADA 2020 guidelines). Patient reports monitoring BG at home. Patient did not record the readings, but says the reading is usually around 110-130. Patient reports no hypoglycemic events. Advised patient to document BG readings.   Dyslipidemia - Atorvastatin  Patient lipids continue to improve for the most part. Patient reports no complaints regarding muscle pain. Last lipid panel done in august of last year. Results showed elevated trigs and low HDL (other labs WNL). Educated patient on food choices to improve HDL and Trigs. Unfortunately patient denied eating any of the choice brought up but said he would try to adjust.    Hypothyroidism - Levothyroxine Patient reports no concerns.  Plan: -Continue medication regimen as is. Patient already set to visit PCP in July 2020.   RTC - 1 year  Neville Route, PharmD Candidate Class of 2021 [facility] in [place]: Netta Neat, PharmD, [facility]) [phone]

## 2019-01-11 ENCOUNTER — Other Ambulatory Visit: Payer: Self-pay

## 2019-01-11 DIAGNOSIS — I10 Essential (primary) hypertension: Secondary | ICD-10-CM

## 2019-01-12 ENCOUNTER — Other Ambulatory Visit: Payer: Self-pay

## 2019-01-12 DIAGNOSIS — I10 Essential (primary) hypertension: Secondary | ICD-10-CM

## 2019-01-14 DIAGNOSIS — C642 Malignant neoplasm of left kidney, except renal pelvis: Secondary | ICD-10-CM

## 2019-01-14 HISTORY — DX: Malignant neoplasm of left kidney, except renal pelvis: C64.2

## 2019-02-04 ENCOUNTER — Other Ambulatory Visit
Admission: RE | Admit: 2019-02-04 | Discharge: 2019-02-04 | Disposition: A | Discharge status: Home / Self Care | Payer: Self-pay | Source: Ambulatory Visit | Attending: Nurse Practitioner | Admitting: Nurse Practitioner

## 2019-02-04 ENCOUNTER — Ambulatory Visit (INDEPENDENT_AMBULATORY_CARE_PROVIDER_SITE_OTHER): Payer: Self-pay | Admitting: Nurse Practitioner

## 2019-02-04 ENCOUNTER — Other Ambulatory Visit: Payer: Self-pay

## 2019-02-04 ENCOUNTER — Encounter: Payer: Self-pay | Admitting: Nurse Practitioner

## 2019-02-04 VITALS — BP 117/64 | HR 76 | Ht 67.0 in | Wt 248.4 lb

## 2019-02-04 DIAGNOSIS — N3001 Acute cystitis with hematuria: Secondary | ICD-10-CM

## 2019-02-04 DIAGNOSIS — E039 Hypothyroidism, unspecified: Secondary | ICD-10-CM | POA: Insufficient documentation

## 2019-02-04 DIAGNOSIS — I10 Essential (primary) hypertension: Secondary | ICD-10-CM

## 2019-02-04 DIAGNOSIS — E1165 Type 2 diabetes mellitus with hyperglycemia: Secondary | ICD-10-CM | POA: Insufficient documentation

## 2019-02-04 DIAGNOSIS — R310 Gross hematuria: Secondary | ICD-10-CM

## 2019-02-04 LAB — POCT GLYCOSYLATED HEMOGLOBIN (HGB A1C): Hemoglobin A1C: 6.5 % — AB (ref 4.0–5.6)

## 2019-02-04 LAB — COMPREHENSIVE METABOLIC PANEL
ALT: 31 U/L (ref 0–44)
AST: 26 U/L (ref 15–41)
Albumin: 4.1 g/dL (ref 3.5–5.0)
Alkaline Phosphatase: 59 U/L (ref 38–126)
Anion gap: 9 (ref 5–15)
BUN: 17 mg/dL (ref 6–20)
CO2: 26 mmol/L (ref 22–32)
Calcium: 9.3 mg/dL (ref 8.9–10.3)
Chloride: 103 mmol/L (ref 98–111)
Creatinine, Ser: 0.83 mg/dL (ref 0.61–1.24)
GFR calc Af Amer: >60 mL/min (ref >60)
GFR calc non Af Amer: >60 mL/min (ref >60)
Glucose, Bld: 129 mg/dL — ABNORMAL HIGH (ref 70–99)
Potassium: 3.6 mmol/L (ref 3.5–5.1)
Sodium: 138 mmol/L (ref 135–145)
Total Bilirubin: 0.7 mg/dL (ref 0.3–1.2)
Total Protein: 8 g/dL (ref 6.5–8.1)

## 2019-02-04 LAB — LIPID PANEL
Cholesterol: 104 mg/dL (ref 0–200)
HDL: 28 mg/dL — ABNORMAL LOW (ref >40)
LDL Cholesterol: 42 mg/dL (ref 0–99)
Total CHOL/HDL Ratio: 3.7 RATIO
Triglycerides: 171 mg/dL — ABNORMAL HIGH (ref <150)
VLDL: 34 mg/dL (ref 0–40)

## 2019-02-04 LAB — POCT URINALYSIS DIPSTICK
Bilirubin, UA: NEGATIVE
Glucose, UA: NEGATIVE
Nitrite, UA: NEGATIVE
Protein, UA: POSITIVE — AB
Spec Grav, UA: >=1.03 — AB (ref 1.010–1.025)
Urobilinogen, UA: 0.2 E.U./dL
pH, UA: 6.5 (ref 5.0–8.0)

## 2019-02-04 LAB — TSH: TSH: 5.613 u[IU]/mL — ABNORMAL HIGH (ref 0.350–4.500)

## 2019-02-04 MED ORDER — LEVOTHYROXINE SODIUM 100 MCG PO TABS: 100.0000 ug | ORAL_TABLET | Freq: Every day | ORAL | 3 refills | Status: DC

## 2019-02-04 MED ORDER — CEPHALEXIN 500 MG PO CAPS: 500.0000 mg | ORAL_CAPSULE | Freq: Three times a day (TID) | ORAL | 0 refills | Status: DC

## 2019-02-04 NOTE — Progress Notes (Signed)
Subjective:    Patient ID: Charles Mathis, male    DOB: 08-07-1970, 48 y.o.   MRN: [id number]  Charles Mathis is a 48 y.o. male presenting on 02/04/2019 for Hematuria (intermittent gross hematuria x 2 week. With urinary hesitancy ) and Diabetes  HPI Hematuria Patient with intermittent gross hematuria x 2 weeks lasts about 1 day, but has recurred off and on over last 2 weeks.  Patient is having mild urinary hesitancy.  He has had this problem in past about 2-3 years ago, but problem was never identified in ER.  Patient is seeing some clots in urine, but they are small. He has not had any periods where he was unable to void.  Diabetes Pt presents today for follow up of Type 2 diabetes mellitus. He is checking fasting am CBG at home with a range of 100-130 - Current diabetic medications include: metformin - He is not currently symptomatic.  - He denies polydipsia, polyphagia, polyuria, headaches, diaphoresis, shakiness, chills, pain, numbness or tingling in extremities and changes in vision.   - Clinical course has been stable. - He  reports no regular exercise routine, but is active at work. - His diet is moderate in salt, moderate in fat, and moderate in carbohydrates. - Weight trend: stable  PREVENTION: Eye exam current (within one year): no Foot exam current (within one year): no  Lipid/ASCVD risk reduction - on statin: yes Kidney protection - on ace or arb: yes Recent Labs    03/13/18 0823 11/07/18 1400 02/04/19 0929  HGBA1C 6.5* 6.8* 6.5*     Social History   Tobacco Use  . Smoking status: Never Smoker  . Smokeless tobacco: Never Used  Substance Use Topics  . Alcohol use: No  . Drug use: No    Review of Systems Per HPI unless specifically indicated above     Objective:    BP 117/64 (BP Location: Left Arm, Patient Position: Sitting, Cuff Size: Large)   Pulse 76   Ht 5\' 7"  (1.702 m)   Wt 248 lb 6.4 oz (112.7 kg)   SpO2 99%   BMI 38.90 kg/m   Wt Readings  from Last 3 Encounters:  02/04/19 248 lb 6.4 oz (112.7 kg)  06/26/18 255 lb 0.2 oz (115.7 kg)  03/18/18 249 lb (112.9 kg)    Physical Exam Vitals signs reviewed.  Constitutional:      General: He is awake. He is not in acute distress.    Appearance: He is well-developed.  HENT:     Head: Normocephalic and atraumatic.  Neck:     Musculoskeletal: Normal range of motion and neck supple.     Vascular: No carotid bruit.  Cardiovascular:     Rate and Rhythm: Normal rate and regular rhythm.     Pulses:          Radial pulses are 2+ on the right side and 2+ on the left side.       Posterior tibial pulses are 1+ on the right side and 1+ on the left side.     Heart sounds: Normal heart sounds, S1 normal and S2 normal.  Pulmonary:     Effort: Pulmonary effort is normal. No respiratory distress.     Breath sounds: Normal breath sounds and air entry.  Abdominal:     General: Bowel sounds are normal. There is no distension.     Palpations: Abdomen is soft.     Tenderness: There is no abdominal tenderness. There is no  right CVA tenderness, left CVA tenderness or rebound.     Hernia: No hernia is present.  Skin:    General: Skin is warm and dry.     Capillary Refill: Capillary refill takes less than 2 seconds.  Neurological:     General: No focal deficit present.     Mental Status: He is alert and oriented to person, place, and time.  Psychiatric:        Attention and Perception: Attention normal.        Mood and Affect: Mood and affect normal.        Behavior: Behavior normal. Behavior is cooperative.    Diabetic Foot Exam - Simple   Simple Foot Form Diabetic Foot exam was performed with the following findings: Yes 02/04/2019  2:00 PM  Visual Inspection No deformities, no ulcerations, no other skin breakdown bilaterally: Yes Sensation Testing Intact to touch and monofilament testing bilaterally: Yes Pulse Check Posterior Tibialis and Dorsalis pulse intact bilaterally: Yes Comments     Results for orders placed or performed in visit on 02/04/19  POCT Urinalysis Dipstick  Result Value Ref Range   Color, UA Red    Clarity, UA cloudy    Glucose, UA Negative Negative   Bilirubin, UA Negative    Ketones, UA trace    Spec Grav, UA >=1.030 (A) 1.010 - 1.025   Blood, UA moderate    pH, UA 6.5 5.0 - 8.0   Protein, UA Positive (A) Negative   Urobilinogen, UA 0.2 0.2 or 1.0 E.U./dL   Nitrite, UA negative    Leukocytes, UA Small (1+) (A) Negative   Appearance     Odor        Assessment & Plan:   Problem List Items Addressed This Visit      Cardiovascular and Mediastinum   Hypertension Stable today on exam.  Medications tolerated without side effects.  Continue at current doses.  Refills provided.  Check labs today. Followup 3 months.      Endocrine   Hypothyroidism Status unknown.  Asymptomatic, so likely stable.  Recheck labs.  Continue meds without changes today.  Refills provided. Followup with med changes prn after labs and in 6 months.    Relevant Medications   levothyroxine (SYNTHROID) 100 MCG tablet   Other Relevant Orders   TSH (Completed)   T2DM (type 2 diabetes mellitus) ([place]) - Primary Controlled DM with A1c stable and below goal A1c < 7.0%. - Complications - dyslipidemia and cataracts.  Plan:  1. Change therapy: REDUCE metformin to 500 mg daily at breakfast. 2. Encourage improved lifestyle: - low carb/low glycemic diet reinforced prior education - Increase physical activity to 30 minutes most days of the week.  Explained that increased physical activity increases body's use of sugar for energy. 3. Check fasting am CBG and bring log to next visit for review. 4. Continue ASA, ARB and Statin 5. DM Foot exam done today with normal findings.   6. Follow-up 3 months.   Relevant Orders   POCT Urinalysis Dipstick (Completed)   Lipid panel (Completed)   Comprehensive Metabolic Panel (CMET) (Completed)    Other Visit Diagnoses    Gross hematuria      Discussed differential dx with patient of UTI, possible kidney stone or other urinary pathology.  Given WBCs on UA, will treat for UTI.  Given precautions that if larger clots are eliminated, unable to void, or bleeding worsens/continues on antibiotics he should be seen in ER or to call clinic for  urology referral.  May need CT scan of kidneys for hematuria workup.  Limited workup initially performed due to uninsured patient status, but discussed all as future options with patient.  Patient agrees with limited workup. Follow-up 2-5 days prn worsening or no improvement.  Will proceed with additional above workup as indicated.   Relevant Medications   cephALEXin (KEFLEX) 500 MG capsule   Other Relevant Orders   POCT glycosylated hemoglobin (Hb A1C) (Completed)   Acute cystitis with hematuria       Relevant Medications   cephALEXin (KEFLEX) 500 MG capsule      Meds ordered this encounter  Medications  . cephALEXin (KEFLEX) 500 MG capsule    Sig: Take 1 capsule (500 mg total) by mouth 3 (three) times daily for 5 days.    Dispense:  15 capsule    Refill:  0    Order Specific Question:   Supervising Provider    Answer:   Olin Hauser [[id number]]  . levothyroxine (SYNTHROID) 100 MCG tablet    Sig: Take 1 tablet (100 mcg total) by mouth daily.    Dispense:  90 tablet    Refill:  3    Order Specific Question:   Supervising Provider    Answer:   Olin Hauser [[id number]]   Follow up plan: Return in about 3 months (around 05/07/2019) for diabetes, hypertension, AND in 1 week prn for blood in urine.  [name], DNP, AGPCNP-BC Adult Gerontology Primary Care Nurse Practitioner [facility] 02/04/2019, 9:22 AM

## 2019-02-04 NOTE — Patient Instructions (Addendum)
Charles Mathis,   Thank you for coming in to clinic today.  1. If you are unable to void for >12 hours or feeling more bladder pressure at shorter time frame, go to ER. 2. START Keflex 500mg  3 times daily for next 5 days.     Please schedule a follow-up appointment with [name], AGNP. Return in about 3 months (around 05/07/2019) for diabetes, hypertension, AND in 1 week prn for blood in urine.  If you have any other questions or concerns, please feel free to call the clinic or send a message through [place]. You may also schedule an earlier appointment if necessary.  You will receive a survey after today's visit either digitally by e-mail or paper by [employer]. Your experiences and feedback matter to [place].  Please respond so we know how we are doing as we provide care for you.   [name], DNP, AGNP-BC Adult Gerontology Nurse Practitioner [place]

## 2019-02-05 ENCOUNTER — Encounter: Payer: Self-pay | Admitting: Emergency Medicine

## 2019-02-05 ENCOUNTER — Inpatient Hospital Stay: Payer: Self-pay

## 2019-02-05 ENCOUNTER — Emergency Department: Payer: Self-pay

## 2019-02-05 ENCOUNTER — Inpatient Hospital Stay
Admission: EM | Admit: 2019-02-05 | Discharge: 2019-02-09 | DRG: 658 | Disposition: A | Discharge status: Home / Self Care | Payer: Self-pay | Attending: Urology | Admitting: Urology

## 2019-02-05 ENCOUNTER — Other Ambulatory Visit: Payer: Self-pay

## 2019-02-05 DIAGNOSIS — E119 Type 2 diabetes mellitus without complications: Secondary | ICD-10-CM | POA: Diagnosis present

## 2019-02-05 DIAGNOSIS — N2889 Other specified disorders of kidney and ureter: Secondary | ICD-10-CM | POA: Diagnosis present

## 2019-02-05 DIAGNOSIS — R339 Retention of urine, unspecified: Secondary | ICD-10-CM | POA: Diagnosis present

## 2019-02-05 DIAGNOSIS — C642 Malignant neoplasm of left kidney, except renal pelvis: Principal | ICD-10-CM | POA: Diagnosis present

## 2019-02-05 DIAGNOSIS — K760 Fatty (change of) liver, not elsewhere classified: Secondary | ICD-10-CM | POA: Diagnosis present

## 2019-02-05 DIAGNOSIS — I2510 Atherosclerotic heart disease of native coronary artery without angina pectoris: Secondary | ICD-10-CM | POA: Diagnosis present

## 2019-02-05 DIAGNOSIS — Z79890 Hormone replacement therapy (postmenopausal): Secondary | ICD-10-CM

## 2019-02-05 DIAGNOSIS — I10 Essential (primary) hypertension: Secondary | ICD-10-CM | POA: Diagnosis present

## 2019-02-05 DIAGNOSIS — E039 Hypothyroidism, unspecified: Secondary | ICD-10-CM | POA: Diagnosis present

## 2019-02-05 DIAGNOSIS — Z8249 Family history of ischemic heart disease and other diseases of the circulatory system: Secondary | ICD-10-CM

## 2019-02-05 DIAGNOSIS — R310 Gross hematuria: Secondary | ICD-10-CM | POA: Diagnosis present

## 2019-02-05 DIAGNOSIS — Z20828 Contact with and (suspected) exposure to other viral communicable diseases: Secondary | ICD-10-CM | POA: Diagnosis present

## 2019-02-05 DIAGNOSIS — Z7984 Long term (current) use of oral hypoglycemic drugs: Secondary | ICD-10-CM

## 2019-02-05 DIAGNOSIS — Z7951 Long term (current) use of inhaled steroids: Secondary | ICD-10-CM

## 2019-02-05 DIAGNOSIS — R911 Solitary pulmonary nodule: Secondary | ICD-10-CM | POA: Diagnosis present

## 2019-02-05 DIAGNOSIS — Z833 Family history of diabetes mellitus: Secondary | ICD-10-CM

## 2019-02-05 DIAGNOSIS — I700 Atherosclerosis of aorta: Secondary | ICD-10-CM | POA: Diagnosis present

## 2019-02-05 DIAGNOSIS — Z818 Family history of other mental and behavioral disorders: Secondary | ICD-10-CM

## 2019-02-05 DIAGNOSIS — Z7982 Long term (current) use of aspirin: Secondary | ICD-10-CM

## 2019-02-05 DIAGNOSIS — Z79899 Other long term (current) drug therapy: Secondary | ICD-10-CM

## 2019-02-05 DIAGNOSIS — Z8349 Family history of other endocrine, nutritional and metabolic diseases: Secondary | ICD-10-CM

## 2019-02-05 HISTORY — DX: Type 2 diabetes mellitus without complications: E11.9

## 2019-02-05 LAB — CBC WITH DIFFERENTIAL/PLATELET
Abs Immature Granulocytes: 0.04 10*3/uL (ref 0.00–0.07)
Basophils Absolute: 0 10*3/uL (ref 0.0–0.1)
Basophils Relative: 0 %
Eosinophils Absolute: 0 10*3/uL (ref 0.0–0.5)
Eosinophils Relative: 0 %
HCT: 33.5 % — ABNORMAL LOW (ref 39.0–52.0)
Hemoglobin: 11.1 g/dL — ABNORMAL LOW (ref 13.0–17.0)
Immature Granulocytes: 0 %
Lymphocytes Relative: 15 %
Lymphs Abs: 1.9 10*3/uL (ref 0.7–4.0)
MCH: 26.8 pg (ref 26.0–34.0)
MCHC: 33.1 g/dL (ref 30.0–36.0)
MCV: 80.9 fL (ref 80.0–100.0)
Monocytes Absolute: 0.7 10*3/uL (ref 0.1–1.0)
Monocytes Relative: 6 %
Neutro Abs: 9.7 10*3/uL — ABNORMAL HIGH (ref 1.7–7.7)
Neutrophils Relative %: 79 %
Platelets: 271 10*3/uL (ref 150–400)
RBC: 4.14 MIL/uL — ABNORMAL LOW (ref 4.22–5.81)
RDW: 14.4 % (ref 11.5–15.5)
WBC: 12.4 10*3/uL — ABNORMAL HIGH (ref 4.0–10.5)
nRBC: 0 % (ref 0.0–0.2)

## 2019-02-05 LAB — PROTIME-INR
INR: 1.1 (ref 0.8–1.2)
Prothrombin Time: 13.8 seconds (ref 11.4–15.2)

## 2019-02-05 LAB — URINALYSIS, COMPLETE (UACMP) WITH MICROSCOPIC
Bacteria, UA: NONE SEEN
RBC / HPF: >50 RBC/hpf — ABNORMAL HIGH (ref 0–5)
Specific Gravity, Urine: 1.025 (ref 1.005–1.030)

## 2019-02-05 LAB — SARS CORONAVIRUS 2 BY RT PCR (HOSPITAL ORDER, PERFORMED IN ~~LOC~~ HOSPITAL LAB): SARS Coronavirus 2: NEGATIVE

## 2019-02-05 MED ORDER — OXYCODONE HCL 5 MG PO TABS
5.0000 mg | ORAL_TABLET | ORAL | Status: DC | PRN
  Administered 2019-02-08: 5 mg via ORAL
  Filled 2019-02-05: qty 1

## 2019-02-05 MED ORDER — HYDROMORPHONE HCL 1 MG/ML IJ SOLN
1.0000 mg | Freq: Once | INTRAMUSCULAR | Status: AC
  Administered 2019-02-05: 1 mg via INTRAVENOUS
  Filled 2019-02-05: qty 1

## 2019-02-05 MED ORDER — IOHEXOL 300 MG/ML  SOLN
100.0000 mL | Freq: Once | INTRAMUSCULAR | Status: AC | PRN
  Administered 2019-02-05: 100 mL via INTRAVENOUS
  Filled 2019-02-05: qty 100

## 2019-02-05 MED ORDER — IOHEXOL 300 MG/ML  SOLN
75.0000 mL | Freq: Once | INTRAMUSCULAR | Status: AC | PRN
  Administered 2019-02-05: 21:00:00 75 mL via INTRAVENOUS
  Filled 2019-02-05: qty 75

## 2019-02-05 MED ORDER — CHLORTHALIDONE 25 MG PO TABS
12.5000 mg | ORAL_TABLET | Freq: Every day | ORAL | Status: DC
  Administered 2019-02-06 – 2019-02-09 (×4): 12.5 mg via ORAL
  Filled 2019-02-05 (×4): qty 0.5

## 2019-02-05 MED ORDER — METFORMIN HCL 500 MG PO TABS
500.0000 mg | ORAL_TABLET | Freq: Two times a day (BID) | ORAL | Status: DC
  Administered 2019-02-06: 10:00:00 500 mg via ORAL
  Filled 2019-02-05: qty 1

## 2019-02-05 MED ORDER — CARVEDILOL 25 MG PO TABS
25.0000 mg | ORAL_TABLET | Freq: Two times a day (BID) | ORAL | Status: DC
  Administered 2019-02-06 – 2019-02-09 (×6): 25 mg via ORAL
  Filled 2019-02-05 (×6): qty 1

## 2019-02-05 MED ORDER — LEVOTHYROXINE SODIUM 100 MCG PO TABS
100.0000 ug | ORAL_TABLET | Freq: Every day | ORAL | Status: DC
  Administered 2019-02-07 – 2019-02-09 (×3): 100 ug via ORAL
  Filled 2019-02-05 (×3): qty 1

## 2019-02-05 MED ORDER — DOCUSATE SODIUM 100 MG PO CAPS
100.0000 mg | ORAL_CAPSULE | Freq: Two times a day (BID) | ORAL | Status: DC
  Administered 2019-02-05 – 2019-02-08 (×3): 100 mg via ORAL
  Filled 2019-02-05 (×8): qty 1

## 2019-02-05 MED ORDER — HYDROMORPHONE HCL 1 MG/ML IJ SOLN
0.5000 mg | INTRAMUSCULAR | Status: DC | PRN
  Administered 2019-02-06 – 2019-02-08 (×6): 1 mg via INTRAVENOUS
  Filled 2019-02-05 (×6): qty 1

## 2019-02-05 MED ORDER — HEPARIN SODIUM (PORCINE) 5000 UNIT/ML IJ SOLN
5000.0000 [IU] | Freq: Three times a day (TID) | INTRAMUSCULAR | Status: DC
  Administered 2019-02-06 – 2019-02-09 (×9): 5000 [IU] via SUBCUTANEOUS
  Filled 2019-02-05 (×9): qty 1

## 2019-02-05 MED ORDER — BELLADONNA ALKALOIDS-OPIUM 16.2-60 MG RE SUPP: 1.0000 | Freq: Four times a day (QID) | RECTAL | Status: DC | PRN

## 2019-02-05 MED ORDER — ATORVASTATIN CALCIUM 20 MG PO TABS
40.0000 mg | ORAL_TABLET | Freq: Every day | ORAL | Status: DC
  Administered 2019-02-06 – 2019-02-09 (×4): 40 mg via ORAL
  Filled 2019-02-05 (×4): qty 2

## 2019-02-05 MED ORDER — ACETAMINOPHEN 325 MG PO TABS: 650.0000 mg | ORAL_TABLET | ORAL | Status: DC | PRN

## 2019-02-05 MED ORDER — SODIUM CHLORIDE 0.9 % IV SOLN
INTRAVENOUS | Status: DC
  Administered 2019-02-05 – 2019-02-08 (×6): via INTRAVENOUS

## 2019-02-05 MED ORDER — ONDANSETRON HCL 4 MG/2ML IJ SOLN: 4.0000 mg | INTRAMUSCULAR | Status: DC | PRN

## 2019-02-05 NOTE — ED Notes (Signed)
Foley inserted by this tech assisted by [facility] EDT

## 2019-02-05 NOTE — ED Triage Notes (Signed)
Presents with some diff with urination  Having some pressure to lower abd  Noticed some blood in urine yesterday  Was placed on keflex for same

## 2019-02-05 NOTE — ED Provider Notes (Signed)
Apolonio Schneiders, attending physician, personally viewed and interpreted this EKG  EKG Time: 2119 Rate: 85 Rhythm: normal sinus rhythm Axis: normal Intervals: qtc 442 QRS: narrow ST changes: no st elevation Impression: normal Val Eagle, MD 02/05/19 2122

## 2019-02-05 NOTE — ED Notes (Signed)
.. ED TO INPATIENT HANDOFF REPORT  ED Nurse Name and Phone #: Deneise Lever [phone]  S Name/Age/Gender Charles Mathis 48 y.o. male Room/Bed: ED01HA/ED01HA  Code Status   Code Status: Not on file  Home/SNF/Other Home Patient oriented to: self, place, time and situation Is this baseline? Yes   Triage Complete: Triage complete  Chief Complaint uti  Triage Note Presents with some diff with urination  Having some pressure to lower abd  Noticed some blood in urine yesterday  Was placed on keflex for same   Allergies No Known Allergies  Level of Care/Admitting Diagnosis ED Disposition    ED Disposition Condition [place]: [place] [[id number]]  Level of Care: Med-Surg [16]  Covid Evaluation: Person Under Investigation (PUI)  Diagnosis: Left renal mass [[id number]]  Admitting Physician: Billey Co [[id number]]  Attending Physician: Sampson Si  Estimated length of stay: 3 - 4 days  Certification:: I certify this patient will need inpatient services for at least 2 midnights  PT Class (Do Not Modify): Inpatient [101]  PT Acc Code (Do Not Modify): Private [1]       B Medical/Surgery History Past Medical History:  Diagnosis Date  . Allergy   . Hypertension    Past Surgical History:  Procedure Laterality Date  . NO PAST SURGERIES       A IV Location/Drains/Wounds Patient Lines/Drains/Airways Status   Active Line/Drains/Airways    Name:   Placement date:   Placement time:   Site:   Days:   Peripheral IV Right Antecubital   -    -    Antecubital             Intake/Output Last 24 hours No intake or output data in the 24 hours ending 02/05/19 2250  Labs/Imaging Results for orders placed or performed during the hospital encounter of 02/05/19 (from the past 48 hour(s))  Urinalysis, Complete w Microscopic     Status: Abnormal   Collection Time: 02/05/19  4:54 PM  Result Value Ref Range   Color, Urine RED (A)  YELLOW   APPearance TURBID (A) CLEAR   Specific Gravity, Urine 1.025 1.005 - 1.030   pH  5.0 - 8.0    TEST NOT REPORTED DUE TO COLOR INTERFERENCE OF URINE PIGMENT   Glucose, UA (A) NEGATIVE mg/dL    TEST NOT REPORTED DUE TO COLOR INTERFERENCE OF URINE PIGMENT   Hgb urine dipstick (A) NEGATIVE    TEST NOT REPORTED DUE TO COLOR INTERFERENCE OF URINE PIGMENT   Bilirubin Urine (A) NEGATIVE    TEST NOT REPORTED DUE TO COLOR INTERFERENCE OF URINE PIGMENT   Ketones, ur (A) NEGATIVE mg/dL    TEST NOT REPORTED DUE TO COLOR INTERFERENCE OF URINE PIGMENT   Protein, ur (A) NEGATIVE mg/dL    TEST NOT REPORTED DUE TO COLOR INTERFERENCE OF URINE PIGMENT   Nitrite (A) NEGATIVE    TEST NOT REPORTED DUE TO COLOR INTERFERENCE OF URINE PIGMENT   Leukocytes,Ua (A) NEGATIVE    TEST NOT REPORTED DUE TO COLOR INTERFERENCE OF URINE PIGMENT   RBC / HPF >50 (H) 0 - 5 RBC/hpf   WBC, UA 21-50 0 - 5 WBC/hpf   Bacteria, UA NONE SEEN NONE SEEN   Squamous Epithelial / LPF 11-20 0 - 5    Comment: Performed at [facility], [address] [place], [place] [address]  Type and screen [place]     Status:  None (Preliminary result)   Collection Time: 02/05/19  8:52 PM  Result Value Ref Range   ABO/RH(D) PENDING    Antibody Screen PENDING    Sample Expiration      02/08/2019,2359 Performed at [facility], [address] [place], [place] [address]   Protime-INR     Status: None   Collection Time: 02/05/19  8:52 PM  Result Value Ref Range   Prothrombin Time 13.8 11.4 - 15.2 seconds   INR 1.1 0.8 - 1.2    Comment: (NOTE) INR goal varies based on device and disease states. Performed at [facility], [address], [place], [place] [address]   SARS Coronavirus 2 (CEPHEID - Performed in [facility] hospital lab), Hosp Order     Status: None   Collection Time: 02/05/19  8:52 PM   Specimen: Nasopharyngeal Swab  Result Value Ref Range   SARS Coronavirus 2  NEGATIVE NEGATIVE    Comment: (NOTE) If result is NEGATIVE SARS-CoV-2 target nucleic acids are NOT DETECTED. The SARS-CoV-2 RNA is generally detectable in upper and lower  respiratory specimens during the acute phase of infection. The lowest  concentration of SARS-CoV-2 viral copies this assay can detect is 250  copies / mL. A negative result does not preclude SARS-CoV-2 infection  and should not be used as the sole basis for treatment or other  patient management decisions.  A negative result may occur with  improper specimen collection / handling, submission of specimen other  than nasopharyngeal swab, presence of viral mutation(s) within the  areas targeted by this assay, and inadequate number of viral copies  (<250 copies / mL). A negative result must be combined with clinical  observations, patient history, and epidemiological information. If result is POSITIVE SARS-CoV-2 target nucleic acids are DETECTED. The SARS-CoV-2 RNA is generally detectable in upper and lower  respiratory specimens dur ing the acute phase of infection.  Positive  results are indicative of active infection with SARS-CoV-2.  Clinical  correlation with patient history and other diagnostic information is  necessary to determine patient infection status.  Positive results do  not rule out bacterial infection or co-infection with other viruses. If result is PRESUMPTIVE POSTIVE SARS-CoV-2 nucleic acids MAY BE PRESENT.   A presumptive positive result was obtained on the submitted specimen  and confirmed on repeat testing.  While 2019 novel coronavirus  (SARS-CoV-2) nucleic acids may be present in the submitted sample  additional confirmatory testing may be necessary for epidemiological  and / or clinical management purposes  to differentiate between  SARS-CoV-2 and other Sarbecovirus currently known to infect humans.  If clinically indicated additional testing with an alternate test  methodology [phone]) is  advised. The SARS-CoV-2 RNA is generally  detectable in upper and lower respiratory sp ecimens during the acute  phase of infection. The expected result is Negative. Fact Sheet for Patients:  StrictlyIdeas.no Fact Sheet for Healthcare Providers: BankingDealers.co.za This test is not yet approved or cleared by the [place] FDA and has been authorized for detection and/or diagnosis of SARS-CoV-2 by FDA under an Emergency Use Authorization (EUA).  This EUA will remain in effect (meaning this test can be used) for the duration of the COVID-19 declaration under Section 564(b)(1) of the Act, 21 U.S.C. section 360bbb-3(b)(1), unless the authorization is terminated or revoked sooner. Performed at [facility], [address] [place], [place] [address]   CBC with Differential/Platelet     Status: Abnormal   Collection Time: 02/05/19  8:54 PM  Result Value Ref Range   WBC 12.4 (H) 4.0 - 10.5 K/uL   RBC 4.14 (L) 4.22 - 5.81 MIL/uL   Hemoglobin 11.1 (L) 13.0 - 17.0 g/dL   HCT 33.5 (L) 39.0 - 52.0 %   MCV 80.9 80.0 - 100.0 fL   MCH 26.8 26.0 - 34.0 pg   MCHC 33.1 30.0 - 36.0 g/dL   RDW 14.4 11.5 - 15.5 %   Platelets 271 150 - 400 K/uL   nRBC 0.0 0.0 - 0.2 %   Neutrophils Relative % 79 %   Neutro Abs 9.7 (H) 1.7 - 7.7 K/uL   Lymphocytes Relative 15 %   Lymphs Abs 1.9 0.7 - 4.0 K/uL   Monocytes Relative 6 %   Monocytes Absolute 0.7 0.1 - 1.0 K/uL   Eosinophils Relative 0 %   Eosinophils Absolute 0.0 0.0 - 0.5 K/uL   Basophils Relative 0 %   Basophils Absolute 0.0 0.0 - 0.1 K/uL   Immature Granulocytes 0 %   Abs Immature Granulocytes 0.04 0.00 - 0.07 K/uL    Comment: Performed at [facility], [address] [place], [place] [address]  Type and screen Ordered by PROVIDER DEFAULT     Status: None (Preliminary result)   Collection Time: 02/05/19  9:51 PM  Result Value Ref Range   ABO/RH(D) PENDING    Antibody  Screen PENDING    Sample Expiration      02/08/2019,2359 Performed at [facility], [address] [place], [place] [address]   Type and screen Ordered by PROVIDER DEFAULT     Status: None (Preliminary result)   Collection Time: 02/05/19 10:18 PM  Result Value Ref Range   ABO/RH(D) PENDING    Antibody Screen PENDING    Sample Expiration      02/08/2019,2359 Performed at [facility], [address] [place], [place] [address]    Ct Abdomen Pelvis W Wo Contrast  Result Date: 02/05/2019 CLINICAL DATA:  Gross hematuria with clots. EXAM: CT ABDOMEN AND PELVIS WITHOUT AND WITH CONTRAST TECHNIQUE: Multidetector CT imaging of the abdomen and pelvis was performed following the standard protocol before and following the bolus administration of intravenous contrast. CONTRAST:  111mL OMNIPAQUE IOHEXOL 300 MG/ML  SOLN COMPARISON:  None. FINDINGS: Lower chest: 4 mm subpleural right middle lobe nodule, image 1 series 4. Coronary artery calcifications. Hepatobiliary: Hepatic steatosis with focal fatty sparing adjacent the gallbladder fossa. No focal hepatic lesion. Vague gallbladder hyperdensity may be sludge or noncalcified stones. No pericholecystic inflammation. No biliary dilatation. Pancreas: No ductal dilatation or inflammation. Spleen: Normal in size without focal abnormality. Adrenals/Urinary Tract: Normal adrenal glands. Large heterogeneous enhancing mass arises from the mid anterior left kidney measuring 8.6 x 8.1 x 9.4 cm. This causes mass effect on the right renal collecting system without frank hydronephrosis. Mass abuts and compresses the renal vein at the hilum but no evidence of filling defect or venous invasion. There is bulging of left renal capsule. Mild left ureteral thickening or periureteric edema distally. No stones in either kidney. Right kidney is unremarkable. No right hydronephrosis, perinephric edema, or focal lesion. Foley catheter within the urinary bladder. No  excreted contrast in the urinary bladder, which is decompressed by Foley catheter limiting assessment. There is lobulated internal hyperdensity, large blood clots versus bladder mass, not well-defined. Mild perivesicular edema anteriorly. Stomach/Bowel: Stomach is nondistended. No bowel wall thickening, inflammation, or obstruction. Appendix possibly but not definitively identified and normal. Moderate stool throughout the colon.  No colonic wall thickening or inflammation. No significant diverticular disease. Mild sigmoid colonic tortuosity. Vascular/Lymphatic: Prominent portal caval node measuring 11 mm short axis, image 46 series 13. No enlarged retroperitoneal lymph nodes. Small bilateral external iliac nodes measure 5 mm and are nonspecific. There is an 8 mm right anterior pararenal node, image 77 series 13. No left perirenal adenopathy. Multiple small mesenteric nodes, not enlarged by size criteria. Mild aortic atherosclerosis without aneurysm. Reproductive: Prostate is unremarkable. Other: No free air or free fluid. Tiny fat containing umbilical hernia. Musculoskeletal: Subcentimeter sclerotic focus in the right proximal femur is nonspecific but likely bone island. No lytic lesions. No acute osseous abnormalities. IMPRESSION: 1. Large heterogeneous enhancing mass arising from the mid anterior left kidney measuring 8.6 x 8.1 x 9.4 cm, highly suspicious for renal cell carcinoma. Mass abuts and compresses the left renal vein without evidence of filling defect or venous invasion. 2. Lobulated hyperdensity within the urinary bladder which is decompressed by Foley catheter. This may represent blood clots versus less likely lobulated bladder mass. Recommend correlation with cystoscopy. 3. Hepatic steatosis. Prominent portal caval and right anterior pararenal lymph nodes, are likely reactive but nonspecific. 4. Subpleural 4 mm pulmonary nodule in the right middle lobe. 5. Aortic Atherosclerosis (ICD10-I70.0). Coronary  artery calcifications. Electronically Signed   By: Keith Rake M.D.   On: 02/05/2019 19:30   Ct Chest W Contrast  Result Date: 02/05/2019 CLINICAL DATA:  Recently diagnosed renal cell carcinoma. Acute onset of gross hematuria and urinary retention. Evaluate for lung metastases. EXAM: CT CHEST WITH CONTRAST TECHNIQUE: Multidetector CT imaging of the chest was performed during intravenous contrast administration. CONTRAST:  89mL OMNIPAQUE IOHEXOL 300 MG/ML  SOLN COMPARISON:  Chest radiograph performed 09/22/2012, and CT of the abdomen and pelvis performed earlier today at 6:57 p.m. FINDINGS: Cardiovascular: The heart is normal in size. Minimal calcification is noted at the aortic arch. The great vessels are unremarkable in appearance. Mediastinum/Nodes: The mediastinum is grossly unremarkable in appearance. No mediastinal lymphadenopathy is seen. No pericardial effusion is identified. A small peripherally calcified hypodensity at the right thyroid lobe is likely benign, given its size. No axillary lymphadenopathy is seen. Lungs/Pleura: The lungs are clear bilaterally. No focal consolidation, pleural effusion or pneumothorax is seen. No masses are identified. There is no evidence for metastatic disease. Upper Abdomen: The visualized portions of the liver and spleen are unremarkable. The visualized portions of the gallbladder and pancreas are within normal limits. Musculoskeletal: No acute osseous abnormalities are identified. The visualized musculature is unremarkable in appearance. IMPRESSION: No evidence of metastatic disease within the chest. Unremarkable contrast-enhanced CT of the chest. Electronically Signed   By: Garald Balding M.D.   On: 02/05/2019 21:40    Pending Labs Unresulted Labs (From admission, onward)    Start     Ordered   02/05/19 1812  Urine culture  Once,   STAT    Question:  Patient immune status  Answer:  Normal   02/05/19 1820   Signed and Held  HIV antibody (Routine Testing)   Once,   R     Signed and Held   Signed and Held  Prepare RBC (crossmatch)  Once,   R    Question Answer Comment  # of Units 2 units   Transfusion Indications Surgery   Date and time of surgery 02/06/19      Signed and Held          Vitals/Pain Today's Vitals   02/05/19 1626 02/05/19 2015 02/05/19  2051 02/05/19 2153  BP: (!) 185/85   127/60  Pulse: 96   77  Resp: 20   16  Temp: 98.4 F (36.9 C)     TempSrc: Oral     SpO2: 98%   96%  Weight:      PainSc:  7  1  1      Isolation Precautions No active isolations  Medications Medications  iohexol (OMNIPAQUE) 300 MG/ML solution 100 mL (100 mLs Intravenous Contrast Given 02/05/19 1854)  HYDROmorphone (DILAUDID) injection 1 mg (1 mg Intravenous Given 02/05/19 2015)  iohexol (OMNIPAQUE) 300 MG/ML solution 75 mL (75 mLs Intravenous Contrast Given 02/05/19 2123)    Mobility walks Low fall risk   Focused Assessments Renal Assessment Handoff:          R Recommendations: See Admitting Provider Note  Report given to:   Additional Notes:

## 2019-02-05 NOTE — ED Notes (Signed)
Was able to void only about 50 ml

## 2019-02-05 NOTE — H&P (Signed)
02/05/19 9:09 PM   Charles Mathis 1971-01-04 [id number]  CC: Hematuria  HPI: I saw Charles Mathis in the emergency department for hematuria.  He is a 48 year old male with past medical history notable for well-controlled diabetes and hypertension who presents with 1 week of gross hematuria with clots, and lower abdominal and left flank pain.  He was seen in the ED almost 2 years ago with gross hematuria, however did not follow-up with urology at that time.  CT scan in the ED demonstrates a 10 cm enhancing left renal mass concerning for renal cell carcinoma, with significant clot burden in the bladder.  There is no obvious bulky metastatic disease or lymphadenopathy, CT chest is pending at this time.  There are no aggravating or alleviating factors.  Severity is severe.  He denies any prior abdominal surgeries.  He is a never smoker.  He denies any family history of any urologic malignancies.  He works as an Conservation officer, nature.  He is married and has a 27 year old son with Down syndrome.   He denies any weight loss, leg pain, chest pain, or shortness of breath.   PMH: Past Medical History:  Diagnosis Date  . Allergy   . Hypertension     Surgical History: Past Surgical History:  Procedure Laterality Date  . NO PAST SURGERIES      Allergies: No Known Allergies  Family History: Family History  Problem Relation Age of Onset  . Diabetes Mother   . Heart disease Mother   . Mental illness Mother   . Thyroid disease Mother   . Heart disease Father   . Diabetes Father   . Mental illness Father     Social History:  reports that he has never smoked. He has never used smokeless tobacco. He reports that he does not drink alcohol or use drugs.  ROS: Please see flowsheet from today's date for complete review of systems.  Physical Exam: BP (!) 185/85 (BP Location: Right Wrist)   Pulse 96   Temp 98.4 F (36.9 C) (Oral)   Resp 20   Wt 112.7 kg   SpO2 98%   BMI 38.91 kg/m     Constitutional:  Alert and oriented, No acute distress. Cardiovascular: Regular rate and rhythm Respiratory: Clear to auscultation bilaterally GI: Abdomen is soft, nontender, nondistended, no abdominal masses GU: Left CVA tenderness, 16 French Foley with dark maroon urine Lymph: No cervical or inguinal lymphadenopathy. Skin: No rashes, bruises or suspicious lesions. Neurologic: Grossly intact, no focal deficits, moving all 4 extremities. Psychiatric: Normal mood and affect.  Laboratory Data: Reviewed  Pertinent Imaging: Reviewed, see HPI for details.  No caval thrombus noted.  Procedure: The 17 [place] two-way Foley was removed.  The patient was prepped and draped in standard sterile fashion and a 24 [place] Roush three-way hematuria catheter passed easily into the bladder with return of maroon urine.  10 cc were placed in the balloon.  The catheter was irrigated with 1.5 L of water with return of ~400 cc maroon clot.  At the conclusion of irrigation urine was light pink.  A catheter plug was placed in the inflow of the three-way catheter.  Assessment & Plan:   In summary, the patient is a healthy 48 year old male with 1 week of gross hematuria and CT demonstrating a 10 cm left enhancing renal mass worrisome for renal cell carcinoma.  There is no obvious abdominal metastatic disease, and CT chest is pending this evening.  High suspicion for RCC invading  into the collecting system causing gross hematuria and clot retention.  I had a very long conversation with the patient tonight about his new diagnosis of likely RCC, and treatment options.  We discussed standard of care would be radical nephrectomy, likely with need for follow-up immunotherapy or other systemic treatments with his likely high stage/high risk disease.  We discussed the risks of surgery including bleeding, infection, damage to surrounding structures, ileus, bowel injury, possible blood transfusion, possible recurrence, PE, MI, and  even death.  We discussed the need for long-term surveillance with routine imaging, and need for likely outpatient oncology consult pending pathology results.  Alternatives to surgery would be observation with high risk of readmission secondary to hematuria and clot retention, or embolization of the kidney with need for delayed nephrectomy.  He is in agreement with proceeding with surgery tomorrow.  Admit to urology, Foley to drainage, irrigate as needed Follow-up CBC and CT chest Type and cross 2 units Anticipate left hand-assisted radical nephrectomy tomorrow afternoon   Billey Co, MD  [facility] [address] [place], [place] [address] [phone]

## 2019-02-05 NOTE — ED Notes (Signed)
Bladder scan done by this tech. Volume 432ml

## 2019-02-05 NOTE — ED Provider Notes (Signed)
[facility] Emergency Department Provider Note ____________________________________________  Time seen: 1705  I have reviewed the triage vital signs and the nursing notes.  HISTORY  Chief Complaint  Dysuria and Hematuria  HPI Charles Mathis is a 48 y.o. male with a history of type 2 diabetes, hypothyroidism, and hypertension, presents himself to the ED for evaluation of gross hematuria and increased urinary retention and difficulty.  Patient reports he was seen by his primary provider yesterday, and started on a prescription of Keflex for his hematuria.  He was told to follow-up or return to the ED if he had increasing difficulty passing urination, that happened today, the patient presents now with urgency, discomfort, and urinary retention.  He denies any fevers, chills, or sweats.  He denies any pelvic pain, flank pain, or abdominal discomfort.  Patient denies a history of kidney stones, but does recall a previous episode of gross hematuria that was evaluated in the ED some years ago, and he was discharged without intervention for spontaneously resolving hematuria.  Past Medical History:  Diagnosis Date  . Allergy   . Hypertension     Patient Active Problem List   Diagnosis Date Noted  . Dyslipidemia 03/24/2018  . T2DM (type 2 diabetes mellitus) ([place]) 09/10/2017  . Hypothyroidism 04/25/2017  . Hypertension 12/13/2016  . Allergy 12/13/2016  . Class 2 severe obesity due to excess calories with serious comorbidity and body mass index (BMI) of 38.0 to 38.9 in adult [facility]) 12/13/2016    Past Surgical History:  Procedure Laterality Date  . NO PAST SURGERIES      Prior to Admission medications   Medication Sig Start Date End Date Taking? Authorizing Provider  amLODipine-olmesartan (AZOR) 5-40 MG tablet Take 1 tablet by mouth daily. 01/05/19   Karamalegos, Devonne Doughty, DO  aspirin EC 81 MG tablet Take 81 mg by mouth daily.    [provider]   atorvastatin (LIPITOR) 40 MG tablet Take 1 tablet (40 mg total) by mouth daily. 01/05/19   Karamalegos, Devonne Doughty, DO  carvedilol (COREG) 25 MG tablet TAKE 1 TABLET TWICE A DAY WITH A MEAL Patient taking differently: Take 25 mg by mouth 2 (two) times daily with a meal. TAKE 1 TABLET TWICE A DAY WITH A MEAL 01/05/19   Karamalegos, Devonne Doughty, DO  cephALEXin (KEFLEX) 500 MG capsule Take 1 capsule (500 mg total) by mouth 3 (three) times daily for 5 days. 02/04/19 02/09/19  Mikey College, NP  cetirizine (ZYRTEC) 10 MG tablet Take 10 mg by mouth daily.    [provider]  chlorthalidone (HYGROTON) 25 MG tablet Take 0.5 tablets (12.5 mg total) by mouth daily. 01/05/19   Karamalegos, Devonne Doughty, DO  fluticasone (FLONASE) 50 MCG/ACT nasal spray Place 2 sprays into both nostrils daily.    [provider]  levothyroxine (SYNTHROID) 100 MCG tablet Take 1 tablet (100 mcg total) by mouth daily. 02/04/19   Mikey College, NP  metFORMIN (GLUCOPHAGE) 500 MG tablet Take 1 tablet (500 mg total) by mouth 2 (two) times daily with a meal. 01/05/19   Karamalegos, Devonne Doughty, DO  potassium chloride (K-DUR) 10 MEQ tablet Take 1 tablet (10 mEq total) by mouth daily. 01/05/19   Olin Hauser, DO    Allergies Patient has no known allergies.  Family History  Problem Relation Age of Onset  . Diabetes Mother   . Heart disease Mother   . Mental illness Mother   . Thyroid disease Mother   .  Heart disease Father   . Diabetes Father   . Mental illness Father     Social History Social History   Tobacco Use  . Smoking status: Never Smoker  . Smokeless tobacco: Never Used  Substance Use Topics  . Alcohol use: No  . Drug use: No    Review of Systems  Constitutional: Negative for fever. Eyes: Negative for visual changes. ENT: Negative for sore throat. Cardiovascular: Negative for chest pain. Respiratory: Negative for shortness of breath. Gastrointestinal: Negative for  abdominal pain, vomiting and diarrhea. Genitourinary: Negative for dysuria.  Reports gross hematuria and urinary retention Musculoskeletal: Negative for back pain. Skin: Negative for rash. Neurological: Negative for headaches, focal weakness or numbness. ____________________________________________  PHYSICAL EXAM:  VITAL SIGNS: ED Triage Vitals  Enc Vitals Group     BP 02/05/19 1626 (!) 185/85     Pulse Rate 02/05/19 1626 96     Resp 02/05/19 1626 20     Temp 02/05/19 1626 98.4 F (36.9 C)     Temp Source 02/05/19 1626 Oral     SpO2 02/05/19 1626 98 %     Weight 02/05/19 1619 248 lb 7.3 oz (112.7 kg)     Height --      Head Circumference --      Peak Flow --      Pain Score 02/05/19 1619 0     Pain Loc --      Pain Edu? --      Excl. in [place]? --     Constitutional: Alert and oriented. Well appearing and in no distress.  Patient appears uncomfortable as he rushes to the bathroom to attempt to void. Head: Normocephalic and atraumatic. Eyes: Conjunctivae are normal. Normal extraocular movements Cardiovascular: Normal rate, regular rhythm. Normal distal pulses. Respiratory: Normal respiratory effort. No wheezes/rales/rhonchi. Gastrointestinal: Soft and nontender. No distention. No CVA tenderness elicited Musculoskeletal: Nontender with normal range of motion in all extremities.  Neurologic:  Normal gait without ataxia. Normal speech and language. No gross focal neurologic deficits are appreciated. Skin:  Skin is warm, dry and intact. No rash noted. Psychiatric: Mood and affect are normal. Patient exhibits appropriate insight and judgment. ____________________________________________   LABS (pertinent positives/negatives) Labs Reviewed  URINALYSIS, COMPLETE (UACMP) WITH MICROSCOPIC - Abnormal; Notable for the following components:      Result Value   Color, Urine RED (*)    APPearance TURBID (*)    Glucose, UA   (*)    Value: TEST NOT REPORTED DUE TO COLOR INTERFERENCE OF  URINE PIGMENT   Hgb urine dipstick   (*)    Value: TEST NOT REPORTED DUE TO COLOR INTERFERENCE OF URINE PIGMENT   Bilirubin Urine   (*)    Value: TEST NOT REPORTED DUE TO COLOR INTERFERENCE OF URINE PIGMENT   Ketones, ur   (*)    Value: TEST NOT REPORTED DUE TO COLOR INTERFERENCE OF URINE PIGMENT   Protein, ur   (*)    Value: TEST NOT REPORTED DUE TO COLOR INTERFERENCE OF URINE PIGMENT   Nitrite   (*)    Value: TEST NOT REPORTED DUE TO COLOR INTERFERENCE OF URINE PIGMENT   Leukocytes,Ua   (*)    Value: TEST NOT REPORTED DUE TO COLOR INTERFERENCE OF URINE PIGMENT   RBC / HPF >50 (*)    All other components within normal limits  URINE CULTURE  SARS CORONAVIRUS 2 (HOSPITAL ORDER, [facility])  COMPREHENSIVE METABOLIC PANEL  CBC WITH DIFFERENTIAL/PLATELET  PROTIME-INR  TYPE AND SCREEN  ____________________________________________   RADIOLOGY  CT ABD/Pelvis w/ CM  IMPRESSION: 1. Large heterogeneous enhancing mass arising from the mid anterior left kidney measuring 8.6 x 8.1 x 9.4 cm, highly suspicious for renal cell carcinoma. Mass abuts and compresses the left renal vein without evidence of filling defect or venous invasion. 2. Lobulated hyperdensity within the urinary bladder which is decompressed by Foley catheter. This may represent blood clots versus less likely lobulated bladder mass. Recommend correlation with cystoscopy. 3. Hepatic steatosis. Prominent portal caval and right anterior pararenal lymph nodes, are likely reactive but nonspecific. 4. Subpleural 4 mm pulmonary nodule in the right middle lobe. 5. Aortic Atherosclerosis (ICD10-I70.0). Coronary artery calcifications. ____________________________________________  PROCEDURES  Procedures Foley catheter  ____________________________________________  INITIAL IMPRESSION / ASSESSMENT AND PLAN / ED COURSE  Oryan Winterton Boutelle was evaluated in Emergency Department on 02/05/2019 for the  symptoms described in the history of present illness. He was evaluated in the context of the global COVID-19 pandemic, which necessitated consideration that the patient might be at risk for infection with the SARS-CoV-2 virus that causes COVID-19. Institutional protocols and algorithms that pertain to the evaluation of patients at risk for COVID-19 are in a state of rapid change based on information released by regulatory bodies including the CDC and federal and state organizations. These policies and algorithms were followed during the patient's care in the ED.  Patient with ED evaluation gross hematuria and urinary retention.  He reports to the ED for continued passing of blood clots after being evaluated by his primary provider and started on Keflex yesterday.  Patient CT scan is concerning as it shows a large renal mass on the left. ----------------------------------------- 7:41 PM on 02/05/2019 -----------------------------------------  Dr. Diamantina Providence- he has evaluated the CT and will consult patient at bedside. Plan is for admission to urology.  ____________________________________________  FINAL CLINICAL IMPRESSION(S) / ED DIAGNOSES  Final diagnoses:  Urinary retention  Gross hematuria  Renal mass, left      Carmie End, Dannielle Karvonen, PA-C 02/05/19 2029    Carrie Mew, MD 02/14/19 [phone]

## 2019-02-06 ENCOUNTER — Encounter: Payer: Self-pay | Admitting: Nurse Practitioner

## 2019-02-06 ENCOUNTER — Ambulatory Visit: Payer: Self-pay | Admitting: Nurse Practitioner

## 2019-02-06 ENCOUNTER — Inpatient Hospital Stay: Payer: Self-pay | Admitting: Anesthesiology

## 2019-02-06 ENCOUNTER — Other Ambulatory Visit: Payer: Self-pay

## 2019-02-06 ENCOUNTER — Encounter: Payer: Self-pay | Admitting: *Deleted

## 2019-02-06 ENCOUNTER — Encounter
Admission: EM | Disposition: A | Discharge status: Home / Self Care | Payer: Self-pay | Source: Home / Self Care | Attending: Urology

## 2019-02-06 DIAGNOSIS — R410 Disorientation, unspecified: Secondary | ICD-10-CM

## 2019-02-06 DIAGNOSIS — D4102 Neoplasm of uncertain behavior of left kidney: Secondary | ICD-10-CM

## 2019-02-06 HISTORY — PX: LAPAROSCOPIC NEPHRECTOMY, HAND ASSISTED: SHX1929

## 2019-02-06 HISTORY — PX: CYSTOSCOPY: SHX5120

## 2019-02-06 LAB — ABO/RH: ABO/RH(D): O POS

## 2019-02-06 LAB — PREPARE RBC (CROSSMATCH)

## 2019-02-06 LAB — GLUCOSE, CAPILLARY
Glucose-Capillary: 128 mg/dL — ABNORMAL HIGH (ref 70–99)
Glucose-Capillary: 140 mg/dL — ABNORMAL HIGH (ref 70–99)

## 2019-02-06 LAB — HEMOGLOBIN AND HEMATOCRIT, BLOOD
HCT: 29.1 % — ABNORMAL LOW (ref 39.0–52.0)
HCT: 28.4 % — ABNORMAL LOW (ref 39.0–52.0)
Hemoglobin: 9.3 g/dL — ABNORMAL LOW (ref 13.0–17.0)
Hemoglobin: 9 g/dL — ABNORMAL LOW (ref 13.0–17.0)

## 2019-02-06 LAB — SURGICAL PCR SCREEN
MRSA, PCR: NEGATIVE
Staphylococcus aureus: NEGATIVE

## 2019-02-06 SURGERY — NEPHRECTOMY, HAND-ASSISTED, LAPAROSCOPIC
Anesthesia: General | Laterality: Left

## 2019-02-06 MED ORDER — ACETAMINOPHEN 325 MG PO TABS: 325.0000 mg | ORAL_TABLET | ORAL | Status: DC | PRN

## 2019-02-06 MED ORDER — ONDANSETRON HCL 4 MG/2ML IJ SOLN
INTRAMUSCULAR | Status: AC
  Filled 2019-02-06: qty 2

## 2019-02-06 MED ORDER — BUPIVACAINE HCL (PF) 0.5 % IJ SOLN
INTRAMUSCULAR | Status: AC
  Filled 2019-02-06: qty 30

## 2019-02-06 MED ORDER — HYDROMORPHONE HCL 1 MG/ML IJ SOLN
0.2500 mg | INTRAMUSCULAR | Status: DC | PRN
  Administered 2019-02-06 (×4): 0.5 mg via INTRAVENOUS

## 2019-02-06 MED ORDER — ALBUMIN HUMAN 5 % IV SOLN
INTRAVENOUS | Status: DC | PRN
  Administered 2019-02-06 (×2): via INTRAVENOUS

## 2019-02-06 MED ORDER — THROMBIN 5000 UNITS EX SOLR
CUTANEOUS | Status: DC | PRN
  Administered 2019-02-06: 5000 [IU] via TOPICAL

## 2019-02-06 MED ORDER — FENTANYL CITRATE (PF) 100 MCG/2ML IJ SOLN
INTRAMUSCULAR | Status: AC
  Filled 2019-02-06: qty 2

## 2019-02-06 MED ORDER — BUPIVACAINE LIPOSOME 1.3 % IJ SUSP
INTRAMUSCULAR | Status: AC
  Filled 2019-02-06: qty 20

## 2019-02-06 MED ORDER — FENTANYL CITRATE (PF) 100 MCG/2ML IJ SOLN
INTRAMUSCULAR | Status: DC | PRN
  Administered 2019-02-06: 50 ug via INTRAVENOUS
  Administered 2019-02-06: 100 ug via INTRAVENOUS

## 2019-02-06 MED ORDER — OXYCODONE HCL 5 MG/5ML PO SOLN: 5.0000 mg | Freq: Once | ORAL | Status: DC | PRN

## 2019-02-06 MED ORDER — LIDOCAINE HCL (PF) 2 % IJ SOLN
INTRAMUSCULAR | Status: AC
  Filled 2019-02-06: qty 10

## 2019-02-06 MED ORDER — HYDROMORPHONE HCL 1 MG/ML IJ SOLN
INTRAMUSCULAR | Status: AC
  Administered 2019-02-06: 18:00:00 0.5 mg via INTRAVENOUS
  Filled 2019-02-06: qty 1

## 2019-02-06 MED ORDER — ACETAMINOPHEN 10 MG/ML IV SOLN
INTRAVENOUS | Status: DC | PRN
  Administered 2019-02-06: 1000 mg via INTRAVENOUS

## 2019-02-06 MED ORDER — PROPOFOL 10 MG/ML IV BOLUS
INTRAVENOUS | Status: AC
  Filled 2019-02-06: qty 20

## 2019-02-06 MED ORDER — DEXAMETHASONE SODIUM PHOSPHATE 10 MG/ML IJ SOLN
INTRAMUSCULAR | Status: DC | PRN
  Administered 2019-02-06: 5 mg via INTRAVENOUS

## 2019-02-06 MED ORDER — ALBUMIN HUMAN 5 % IV SOLN
INTRAVENOUS | Status: AC
  Filled 2019-02-06: qty 500

## 2019-02-06 MED ORDER — ONDANSETRON HCL 4 MG/2ML IJ SOLN
INTRAMUSCULAR | Status: DC | PRN
  Administered 2019-02-06: 4 mg via INTRAVENOUS

## 2019-02-06 MED ORDER — PROMETHAZINE HCL 25 MG/ML IJ SOLN: 6.2500 mg | INTRAMUSCULAR | Status: DC | PRN

## 2019-02-06 MED ORDER — CEFAZOLIN SODIUM-DEXTROSE 2-3 GM-%(50ML) IV SOLR
INTRAVENOUS | Status: DC | PRN
  Administered 2019-02-06: 2 g via INTRAVENOUS

## 2019-02-06 MED ORDER — LIDOCAINE HCL (CARDIAC) PF 100 MG/5ML IV SOSY
PREFILLED_SYRINGE | INTRAVENOUS | Status: DC | PRN
  Administered 2019-02-06: 80 mg via INTRAVENOUS

## 2019-02-06 MED ORDER — BUPIVACAINE LIPOSOME 1.3 % IJ SUSP
INTRAMUSCULAR | Status: DC | PRN
  Administered 2019-02-06: 40 mL

## 2019-02-06 MED ORDER — OXYCODONE HCL 5 MG PO TABS: 5.0000 mg | ORAL_TABLET | Freq: Once | ORAL | Status: DC | PRN

## 2019-02-06 MED ORDER — MIDAZOLAM HCL 2 MG/2ML IJ SOLN
INTRAMUSCULAR | Status: DC | PRN
  Administered 2019-02-06: 2 mg via INTRAVENOUS

## 2019-02-06 MED ORDER — SUGAMMADEX SODIUM 200 MG/2ML IV SOLN
INTRAVENOUS | Status: DC | PRN
  Administered 2019-02-06: 220 mg via INTRAVENOUS

## 2019-02-06 MED ORDER — SODIUM CHLORIDE 0.9 % IV SOLN
INTRAVENOUS | Status: DC | PRN
  Administered 2019-02-06: 20 ug/min via INTRAVENOUS

## 2019-02-06 MED ORDER — SUCCINYLCHOLINE CHLORIDE 20 MG/ML IJ SOLN
INTRAMUSCULAR | Status: DC | PRN
  Administered 2019-02-06: 100 mg via INTRAVENOUS

## 2019-02-06 MED ORDER — PROPOFOL 10 MG/ML IV BOLUS
INTRAVENOUS | Status: DC | PRN
  Administered 2019-02-06: 150 mg via INTRAVENOUS

## 2019-02-06 MED ORDER — CEFAZOLIN SODIUM 1 G IJ SOLR
INTRAMUSCULAR | Status: AC
  Filled 2019-02-06: qty 20

## 2019-02-06 MED ORDER — MEPERIDINE HCL 50 MG/ML IJ SOLN: 6.2500 mg | INTRAMUSCULAR | Status: DC | PRN

## 2019-02-06 MED ORDER — ROCURONIUM BROMIDE 100 MG/10ML IV SOLN
INTRAVENOUS | Status: DC | PRN
  Administered 2019-02-06: 30 mg via INTRAVENOUS
  Administered 2019-02-06 (×2): 25 mg via INTRAVENOUS
  Administered 2019-02-06: 20 mg via INTRAVENOUS

## 2019-02-06 MED ORDER — SODIUM CHLORIDE 0.9% IV SOLUTION: Freq: Once | INTRAVENOUS | Status: DC

## 2019-02-06 MED ORDER — CHLORHEXIDINE GLUCONATE CLOTH 2 % EX PADS
6.0000 | MEDICATED_PAD | Freq: Once | CUTANEOUS | Status: AC
  Administered 2019-02-06: 6 via TOPICAL

## 2019-02-06 MED ORDER — ACETAMINOPHEN 160 MG/5ML PO SOLN
325.0000 mg | ORAL | Status: DC | PRN
  Filled 2019-02-06: qty 20.3

## 2019-02-06 MED ORDER — ACETAMINOPHEN 10 MG/ML IV SOLN
INTRAVENOUS | Status: AC
  Filled 2019-02-06: qty 100

## 2019-02-06 MED ORDER — EPHEDRINE SULFATE 50 MG/ML IJ SOLN
INTRAMUSCULAR | Status: DC | PRN
  Administered 2019-02-06 (×2): 10 mg via INTRAVENOUS

## 2019-02-06 MED ORDER — FENTANYL CITRATE (PF) 250 MCG/5ML IJ SOLN
INTRAMUSCULAR | Status: AC
  Filled 2019-02-06: qty 5

## 2019-02-06 MED ORDER — PHENYLEPHRINE HCL (PRESSORS) 10 MG/ML IV SOLN
INTRAVENOUS | Status: DC | PRN
  Administered 2019-02-06: 200 ug via INTRAVENOUS
  Administered 2019-02-06 (×2): 100 ug via INTRAVENOUS
  Administered 2019-02-06: 300 ug via INTRAVENOUS
  Administered 2019-02-06: 100 ug via INTRAVENOUS
  Administered 2019-02-06: 200 ug via INTRAVENOUS
  Administered 2019-02-06: 100 ug via INTRAVENOUS
  Administered 2019-02-06 (×2): 200 ug via INTRAVENOUS

## 2019-02-06 MED ORDER — SUGAMMADEX SODIUM 500 MG/5ML IV SOLN
INTRAVENOUS | Status: AC
  Filled 2019-02-06: qty 5

## 2019-02-06 MED ORDER — MIDAZOLAM HCL 2 MG/2ML IJ SOLN
INTRAMUSCULAR | Status: AC
  Filled 2019-02-06: qty 2

## 2019-02-06 SURGICAL SUPPLY — 88 items
ANCHOR TIS RET SYS 1550ML (BAG) ×1 IMPLANT
APPLICATOR SURGIFLO ENDO (HEMOSTASIS) IMPLANT
APPLIER CLIP ROT 10 11.4 M/L (STAPLE)
APPLIER CLIP ROT 13.4 12 LRG (CLIP)
CANISTER SUCT 1200ML W/VALVE (MISCELLANEOUS) ×3 IMPLANT
CHLORAPREP W/TINT 26 (MISCELLANEOUS) ×3 IMPLANT
CLEANER CAUTERY TIP 5X5 PAD (MISCELLANEOUS) ×2 IMPLANT
CLIP APPLIE ROT 10 11.4 M/L (STAPLE) IMPLANT
CLIP APPLIE ROT 13.4 12 LRG (CLIP) IMPLANT
CLIP VESOLOCK LG 6/CT PURPLE (CLIP) ×5 IMPLANT
COVER WAND RF STERILE (DRAPES) ×2 IMPLANT
CUTTER ECHEON FLEX ENDO 45 340 (ENDOMECHANICALS) IMPLANT
DEFOGGER SCOPE WARMER CLEARIFY (MISCELLANEOUS) ×3 IMPLANT
DERMABOND ADVANCED (GAUZE/BANDAGES/DRESSINGS) ×1
DERMABOND ADVANCED .7 DNX12 (GAUZE/BANDAGES/DRESSINGS) ×2 IMPLANT
DISSECTOR KITTNER STICK (MISCELLANEOUS) ×2 IMPLANT
DISSECTORS/KITTNER STICK (MISCELLANEOUS) ×3
DRAPE INCISE IOBAN 66X45 STRL (DRAPES) ×2 IMPLANT
DRAPE SURG 17X11 SM STRL (DRAPES) ×12 IMPLANT
DRSG TELFA 3X8 NADH (GAUZE/BANDAGES/DRESSINGS) IMPLANT
ELECT REM PT RETURN 9FT ADLT (ELECTROSURGICAL) ×3
ELECTRODE REM PT RTRN 9FT ADLT (ELECTROSURGICAL) ×2 IMPLANT
GLOVE BIO SURGEON STRL SZ 6.5 (GLOVE) ×3 IMPLANT
GLOVE BIOGEL PI IND STRL 7.5 (GLOVE) ×2 IMPLANT
GLOVE BIOGEL PI INDICATOR 7.5 (GLOVE) ×2
GOWN STRL REUS W/ TWL LRG LVL3 (GOWN DISPOSABLE) ×4 IMPLANT
GOWN STRL REUS W/ TWL XL LVL3 (GOWN DISPOSABLE) ×2 IMPLANT
GOWN STRL REUS W/TWL LRG LVL3 (GOWN DISPOSABLE) ×2
GOWN STRL REUS W/TWL XL LVL3 (GOWN DISPOSABLE) ×1
GRASPER SUT TROCAR 14GX15 (MISCELLANEOUS) ×3 IMPLANT
HANDLE YANKAUER SUCT BULB TIP (MISCELLANEOUS) IMPLANT
HEMOSTAT SURGICEL 2X14 (HEMOSTASIS) ×1 IMPLANT
HOLDER FOLEY CATH W/STRAP (MISCELLANEOUS) ×3 IMPLANT
IRRIGATION STRYKERFLOW (MISCELLANEOUS) ×2 IMPLANT
IRRIGATOR STRYKERFLOW (MISCELLANEOUS) ×6
IV NS 1000ML (IV SOLUTION) ×1
IV NS 1000ML BAXH (IV SOLUTION) ×2 IMPLANT
KIT PINK PAD W/HEAD ARE REST (MISCELLANEOUS) ×3
KIT PINK PAD W/HEAD ARM REST (MISCELLANEOUS) ×2 IMPLANT
KIT TURNOVER KIT A (KITS) ×3 IMPLANT
L-HOOK LAP DISP 36CM (ELECTROSURGICAL) ×3
LABEL OR SOLS (LABEL) ×2 IMPLANT
LHOOK LAP DISP 36CM (ELECTROSURGICAL) ×2 IMPLANT
LIGASURE LAP ATLAS 10MM 37CM (INSTRUMENTS) IMPLANT
LOOP RED MAXI  1X406MM (MISCELLANEOUS) ×1
LOOP VESSEL MAXI 1X406 RED (MISCELLANEOUS) ×2 IMPLANT
NDL HYPO 21X1.5 SAFETY (NEEDLE) ×2 IMPLANT
NDL INSUFFLATION 14GA 120MM (NEEDLE) IMPLANT
NEEDLE HYPO 21X1.5 SAFETY (NEEDLE) ×3 IMPLANT
NEEDLE INSUFFLATION 14GA 120MM (NEEDLE) IMPLANT
PACK LAP CHOLECYSTECTOMY (MISCELLANEOUS) ×3 IMPLANT
PAD CLEANER CAUTERY TIP 5X5 (MISCELLANEOUS) ×1
PAD DRESSING TELFA 3X8 NADH (GAUZE/BANDAGES/DRESSINGS) IMPLANT
PENCIL ELECTRO HAND CTR (MISCELLANEOUS) ×3 IMPLANT
RELOAD STAPLE 35X2.5 WHT THIN (STAPLE) IMPLANT
RELOAD STAPLE 60 2.6 WHT THN (STAPLE) IMPLANT
RELOAD STAPLER WHITE 60MM (STAPLE) ×14 IMPLANT
RELOAD WH ECHELON 45 (STAPLE) IMPLANT
SCISSORS METZENBAUM CVD 33 (INSTRUMENTS) ×3 IMPLANT
SET TUBE SMOKE EVAC HIGH FLOW (TUBING) ×3 IMPLANT
SHEARS HARMONIC ACE PLUS 36CM (ENDOMECHANICALS) IMPLANT
SPOGE SURGIFLO 8M (HEMOSTASIS) ×1
SPONGE LAP 18X18 RF (DISPOSABLE) ×5 IMPLANT
SPONGE SURGIFLO 8M (HEMOSTASIS) IMPLANT
STAPLE RELOAD 2.5MM WHITE (STAPLE) IMPLANT
STAPLER ECHELON LONG 60 440 (INSTRUMENTS) ×2 IMPLANT
STAPLER RELOAD WHITE 60MM (STAPLE) ×21
STAPLER SKIN PROX 35W (STAPLE) ×3 IMPLANT
STAPLER VASCULAR ECHELON 35 (CUTTER) IMPLANT
SUT CHROMIC 0 CT 1 (SUTURE) IMPLANT
SUT MNCRL AB 4-0 PS2 18 (SUTURE) ×6 IMPLANT
SUT PDS AB 1 CT1 36 (SUTURE) ×8 IMPLANT
SUT PDS AB 1 TP1 96 (SUTURE) ×3 IMPLANT
SUT VIC AB 0 CT1 36 (SUTURE) ×8 IMPLANT
SUT VIC AB 1 CT1 36 (SUTURE) IMPLANT
SUT VIC AB 2-0 SH 27 (SUTURE)
SUT VIC AB 2-0 SH 27XBRD (SUTURE) IMPLANT
SUT VIC AB 2-0 UR6 27 (SUTURE) IMPLANT
SUT VIC AB 4-0 FS2 27 (SUTURE) ×3 IMPLANT
SUT VICRYL 0 AB UR-6 (SUTURE) ×3 IMPLANT
SYR 20ML LL LF (SYRINGE) ×3 IMPLANT
SYRINGE IRR TOOMEY STRL 70CC (SYRINGE) ×1 IMPLANT
SYS LAPSCP GELPORT 120MM (MISCELLANEOUS) ×3
SYSTEM LAPSCP GELPORT 120MM (MISCELLANEOUS) ×2 IMPLANT
TRAY FOLEY MTR SLVR 16FR STAT (SET/KITS/TRAYS/PACK) ×2 IMPLANT
TROCAR ENDOPATH XCEL 12X100 BL (ENDOMECHANICALS) ×3 IMPLANT
TROCAR XCEL 12X100 BLDLESS (ENDOMECHANICALS) ×3 IMPLANT
TROCAR XCEL NON-BLD 5MMX100MML (ENDOMECHANICALS) ×3 IMPLANT

## 2019-02-06 NOTE — Anesthesia Procedure Notes (Signed)
Procedure Name: Intubation Date/Time: 02/06/2019 12:58 PM Performed by: Zetta Bills, CRNA Pre-anesthesia Checklist: Patient identified, Suction available, Emergency Drugs available and Patient being monitored Patient Re-evaluated:Patient Re-evaluated prior to induction Oxygen Delivery Method: Circle system utilized Preoxygenation: Pre-oxygenation with 100% oxygen Induction Type: IV induction Ventilation: Mask ventilation without difficulty Laryngoscope Size: Mac and 4 Grade View: Grade II Tube type: Oral Tube size: 7.5 mm Number of attempts: 1 Airway Equipment and Method: Stylet Placement Confirmation: ETT inserted through vocal cords under direct vision Secured at: 21 cm Tube secured with: Tape Dental Injury: Teeth and Oropharynx as per pre-operative assessment

## 2019-02-06 NOTE — Anesthesia Postprocedure Evaluation (Signed)
Anesthesia Post Note  Patient: Charles Mathis  Procedure(s) Performed: HAND ASSISTED LAPAROSCOPIC LEFT RADICAL NEPHRECTOMY (Left ) CYSTOSCOPY  Patient location during evaluation: PACU Anesthesia Type: General Level of consciousness: awake and alert Pain management: pain level controlled Vital Signs Assessment: post-procedure vital signs reviewed and stable Respiratory status: spontaneous breathing and respiratory function stable Cardiovascular status: stable Anesthetic complications: no     Last Vitals:  Vitals:   02/06/19 1810 02/06/19 1819  BP: 113/64 116/68  Pulse: 69 73  Resp: 12 12  Temp:  36.6 C  SpO2: 98% 98%    Last Pain:  Vitals:   02/06/19 1819  TempSrc:   PainSc: 4                  KEPHART,Bereket K

## 2019-02-06 NOTE — Plan of Care (Signed)
  Problem: Education: Goal: Knowledge of General Education information will improve Description: Including pain rating scale, medication(s)/side effects and non-pharmacologic comfort measures Outcome: Progressing   Problem: Elimination: Goal: Will not experience complications related to urinary retention Outcome: Progressing   Problem: Pain Managment: Goal: General experience of comfort will improve Outcome: Progressing   

## 2019-02-06 NOTE — Transfer of Care (Signed)
Immediate Anesthesia Transfer of Care Note  Patient: Charles Mathis  Procedure(s) Performed: HAND ASSISTED LAPAROSCOPIC LEFT RADICAL NEPHRECTOMY (Left ) CYSTOSCOPY  Patient Location: PACU  Anesthesia Type:General  Level of Consciousness: drowsy and patient cooperative  Airway & Oxygen Therapy: Patient Spontanous Breathing and Patient connected to face mask oxygen  Post-op Assessment: Report given to RN and Post -op Vital signs reviewed and stable  Post vital signs: Reviewed and stable  Last Vitals:  Vitals Value Taken Time  BP 108/71 02/06/19 1710  Temp 36.6 C 02/06/19 1710  Pulse 72 02/06/19 1713  Resp 27 02/06/19 1712  SpO2 100 % 02/06/19 1713  Vitals shown include unvalidated device data.  Last Pain:  Vitals:   02/06/19 1156  TempSrc: Tympanic  PainSc: 0-No pain         Complications: No apparent anesthesia complications

## 2019-02-06 NOTE — Op Note (Signed)
Date of procedure: 02/06/19  Preoperative diagnosis:  1. Left 10 cm enhancing renal mass, gross hematuria  Postoperative diagnosis:  1. Same  Procedure: 1. Left laparoscopic hand-assisted radical nephrectomy 2. Cystoscopy  Surgeon: Nickolas Madrid, MD  Assistant: Hollice Espy, MD  Anesthesia: General  Complications: None  Intraoperative findings:  1.  Uncomplicated left radical laparoscopic nephrectomy 2.  Cystoscopy with no tumors, lesions, or residual clot burden  EBL: 300cc  Specimens: Left kidney and proximal ureter  Drains: 20 French two-way Foley  Indication: Charles Mathis is a 48 y.o. patient that presented last night to the emergency department with gross hematuria and clot retention, and CT scan showing a 10 cm enhancing left renal mass with invasion into the collecting system as a likely etiology of his hematuria.  We discussed options at length, and he elected to proceed with left radical nephrectomy today.  After reviewing the management options for treatment, they elected to proceed with the above surgical procedure(s). We have discussed the potential benefits and risks of the procedure, side effects of the proposed treatment, the likelihood of the patient achieving the goals of the procedure, and any potential problems that might occur during the procedure or recuperation. Informed consent has been obtained.  Description of procedure:  The patient was taken to the operating room and general anesthesia was induced. SCDs were placed for DVT prophylaxis.  Subcutaneous heparin was given.  The 24 [place] Rousch hematuria catheter was thoroughly irrigated with return of less than 25 cc of old clot.  The urine was clear.  The patient was placed in the modified flank position with the left side up, prepped and draped in the usual sterile fashion, and preoperative antibiotics (Ancef) were administered. A preoperative time-out was performed.   A 7 cm incision was made from  the sternum down towards the umbilicus, and dissected down to the fascia.  This was sharply incised.  We then opened the peritoneum carefully and gained access to the abdomen.  A hand port was inserted, and the abdomen was insufflated.  Two additional 11 mm ports were placed in the left lower quadrant under direct vision.  The left colon was draped over top of the kidney, and the large mass was clearly visible.  We reflected the left colon off of the kidney and was able to identify the ureter.  This was followed up toward the hilum.  There were multiple parasitic vessels, and the hilum was quite fibrotic and challenging.  The vessels were identified and multiple staple loads were used to take the hilum en bloc.  The kidney was freed laterally using the LigaSure.  An additional staple load was used superiorly and the adrenal was spared.  2 Weck clips were placed on the distal aspect of the ureter, and the ureter was cut.  The kidney was placed in a large Endo Catch bag and removed through the hand port site.  Thorough inspection under low flow conditions demonstrated no bleeding.  Surgicel and Surgi-Flo were placed adjacent to the hilum.  The Carter-Thomason device was used to close both 11 mm ports under direct vision.  All incisions were copiously irrigated.  The fascia of the extraction site was closed using an 0-looped PDS.  A 2-0 Vicryl was used to close the dead space of the midline incision.  Exparel was injected into all incision sites.  The skin was closed with 4-0 Monocryl, and Dermabond was applied.  All counts were correct.  At the conclusion of the  case the Roush catheter was irrigated again with return of no clots.  The catheter was removed and flexible cystoscopy was performed in standard sterile fashion and demonstrated a small prostate and no bladder lesions or residual clot.  A 20 [place] two-way Foley was then replaced with return of faint pink urine.  10 cc were placed in the  balloon  Disposition: Stable to PACU  Plan: Clear liquid diet tomorrow, advance as tolerated Anticipate discharge Sunday We will follow-up in 3 to 4 weeks in clinic for wound check and discuss pathology  Nickolas Madrid, MD

## 2019-02-06 NOTE — Anesthesia Preprocedure Evaluation (Addendum)
Anesthesia Evaluation  Patient identified by MRN, date of birth, ID band Patient awake    Reviewed: Allergy & Precautions, H&P , NPO status , reviewed documented beta blocker date and time   Airway Mallampati: III  TM Distance: >3 FB Neck ROM: full    Dental  (+) Dental Advidsory Given, Chipped, Missing, Poor Dentition Very poor dentition, none loose per pt:   Pulmonary    Pulmonary exam normal        Cardiovascular hypertension, Normal cardiovascular exam     Neuro/Psych    GI/Hepatic neg GERD  ,  Endo/Other  diabetes, Type 2Hypothyroidism   Renal/GU      Musculoskeletal   Abdominal   Peds  Hematology   Anesthesia Other Findings Past Medical History: No date: Allergy No date: Diabetes mellitus without complication (HCC) No date: Hypertension Past Surgical History:  M&T as child Closed reduction nasal fracture  BMI    Body Mass Index: 37.19 kg/m     Reproductive/Obstetrics                             Anesthesia Physical Anesthesia Plan  ASA: III  Anesthesia Plan: General   Post-op Pain Management:    Induction: Intravenous  PONV Risk Score and Plan: 3 and Ondansetron, Treatment may vary due to age or medical condition, Midazolam, Dexamethasone and Metaclopromide  Airway Management Planned: Oral ETT  Additional Equipment:   Intra-op Plan:   Post-operative Plan: Extubation in OR  Informed Consent: I have reviewed the patients History and Physical, chart, labs and discussed the procedure including the risks, benefits and alternatives for the proposed anesthesia with the patient or authorized representative who has indicated his/her understanding and acceptance.     Dental Advisory Given  Plan Discussed with: CRNA  Anesthesia Plan Comments:        Anesthesia Quick Evaluation

## 2019-02-06 NOTE — H&P (Signed)
UROLOGY H&P UPDATE  Agree with prior H&P dated 02/05/2019.  48 year old male presenting with 1 week of gross hematuria and left groin and flank pain with CT demonstrating a left 10 cm enhancing renal mass worrisome for RCC, and bladder with significant clot burden.  Complete staging with CT chest abdomen and pelvis negative for any definitive metastatic disease.  Cardiac: RRR Lungs: CTA bilaterally  Laterality: Left Procedure: Left hand-assisted laparoscopic radical nephrectomy, cystoscopy and clot evacuation  Informed consent obtained, we specifically discussed the risks of bleeding, infection, post-operative pain, need for additional procedures, injury to surrounding structures, bowel injury, PE, MI, and even death.  Billey Co, MD 02-13-19

## 2019-02-06 NOTE — Anesthesia Post-op Follow-up Note (Signed)
Anesthesia QCDR form completed.        

## 2019-02-07 ENCOUNTER — Encounter: Payer: Self-pay | Admitting: Urology

## 2019-02-07 LAB — URINE CULTURE
Culture: NO GROWTH
Special Requests: NORMAL

## 2019-02-07 LAB — TYPE AND SCREEN
ABO/RH(D): O POS
Antibody Screen: NEGATIVE
Unit division: 0
Unit division: 0
Unit division: 0

## 2019-02-07 LAB — BPAM RBC
Blood Product Expiration Date: 202008082359
Blood Product Expiration Date: 202008122359
Blood Product Expiration Date: 202008192359
Unit Type and Rh: 5100
Unit Type and Rh: 5100
Unit Type and Rh: 5100

## 2019-02-07 LAB — HIV ANTIBODY (ROUTINE TESTING W REFLEX): HIV Screen 4th Generation wRfx: NONREACTIVE

## 2019-02-07 NOTE — Progress Notes (Signed)
1 Day Post-Op Subjective: Patient reports abdominal pain and spasm. He has not been out of bed. No flatus.   Objective: Vital signs in last 24 hours: Temp:  [97.8 F (36.6 C)-98.8 F (37.1 C)] 98.8 F (37.1 C) (07/25 0558) Pulse Rate:  [66-81] 81 (07/25 0558) Resp:  [12-18] 18 (07/25 0558) BP: (107-123)/(62-72) 123/71 (07/25 0558) SpO2:  [92 %-100 %] 95 % (07/25 0558)  Intake/Output from previous day: 07/24 0701 - 07/25 0700 In: 5079.4 [I.V.:4579.4; IV Piggyback:500] Out: 1900 [Urine:1600; Blood:300] Intake/Output this shift: Total I/O In: 451.4 [I.V.:451.4] Out: -   Physical Exam:  NAD Alert and oriented Watching TV CV _ RRR Resp - reg effort and depth Abd - soft, NT, mild distended, incisions C/D/I GU - foley in place, urine clear.    Lab Results: Recent Labs    02/05/19 2054 02/06/19 1348 02/06/19 1511  HGB 11.1* 9.3* 9.0*  HCT 33.5* 29.1* 28.4*   BMET No results for input(s): NA, K, CL, CO2, GLUCOSE, BUN, CREATININE, CALCIUM in the last 72 hours. Recent Labs    02/05/19 2052  INR 1.1   No results for input(s): LABURIN in the last 72 hours. Results for orders placed or performed during the hospital encounter of 02/05/19  Urine culture     Status: None   Collection Time: 02/05/19  5:37 PM   Specimen: Urine, Catheterized  Result Value Ref Range Status   Specimen Description   Final    URINE, CATHETERIZED Performed at [facility], [address] [place], [place] [address]    Special Requests   Final    Normal Performed at [facility], [address], [place], [place] [address]    Culture   Final    NO GROWTH Performed at [facility], [place] [address] [place], [place] [address]    Report Status 02/07/2019 FINAL  Final  SARS Coronavirus 2 (CEPHEID - Performed in [place] hospital lab), Hosp Order     Status: None   Collection Time: 02/05/19  8:52 PM   Specimen: Nasopharyngeal Swab  Result Value Ref Range Status    SARS Coronavirus 2 NEGATIVE NEGATIVE Final    Comment: (NOTE) If result is NEGATIVE SARS-CoV-2 target nucleic acids are NOT DETECTED. The SARS-CoV-2 RNA is generally detectable in upper and lower  respiratory specimens during the acute phase of infection. The lowest  concentration of SARS-CoV-2 viral copies this assay can detect is 250  copies / mL. A negative result does not preclude SARS-CoV-2 infection  and should not be used as the sole basis for treatment or other  patient management decisions.  A negative result may occur with  improper specimen collection / handling, submission of specimen other  than nasopharyngeal swab, presence of viral mutation(s) within the  areas targeted by this assay, and inadequate number of viral copies  (<250 copies / mL). A negative result must be combined with clinical  observations, patient history, and epidemiological information. If result is POSITIVE SARS-CoV-2 target nucleic acids are DETECTED. The SARS-CoV-2 RNA is generally detectable in upper and lower  respiratory specimens dur ing the acute phase of infection.  Positive  results are indicative of active infection with SARS-CoV-2.  Clinical  correlation with patient history and other diagnostic information is  necessary to determine patient infection status.  Positive results do  not rule out bacterial infection or co-infection with other viruses. If result is PRESUMPTIVE POSTIVE SARS-CoV-2 nucleic acids MAY BE PRESENT.   A presumptive  positive result was obtained on the submitted specimen  and confirmed on repeat testing.  While 2019 novel coronavirus  (SARS-CoV-2) nucleic acids may be present in the submitted sample  additional confirmatory testing may be necessary for epidemiological  and / or clinical management purposes  to differentiate between  SARS-CoV-2 and other Sarbecovirus currently known to infect humans.  If clinically indicated additional testing with an alternate test   methodology [phone]) is advised. The SARS-CoV-2 RNA is generally  detectable in upper and lower respiratory sp ecimens during the acute  phase of infection. The expected result is Negative. Fact Sheet for Patients:  StrictlyIdeas.no Fact Sheet for Healthcare Providers: BankingDealers.co.za This test is not yet approved or cleared by the [place] FDA and has been authorized for detection and/or diagnosis of SARS-CoV-2 by FDA under an Emergency Use Authorization (EUA).  This EUA will remain in effect (meaning this test can be used) for the duration of the COVID-19 declaration under Section 564(b)(1) of the Act, 21 U.S.C. section 360bbb-3(b)(1), unless the authorization is terminated or revoked sooner. Performed at [facility], [address], [place], [place] [address]   Surgical PCR screen     Status: None   Collection Time: 02/06/19  4:31 AM   Specimen: Nasal Mucosa; Nasal Swab  Result Value Ref Range Status   MRSA, PCR NEGATIVE NEGATIVE Final   Staphylococcus aureus NEGATIVE NEGATIVE Final    Comment: (NOTE) The Xpert SA Assay (FDA approved for NASAL specimens in patients 18 years of age and older), is one component of a comprehensive surveillance program. It is not intended to diagnose infection nor to guide or monitor treatment. Performed at [facility], [address] [place], [place] [address]     Studies/Results: Ct Abdomen Pelvis W Wo Contrast  Result Date: 02/05/2019 CLINICAL DATA:  Gross hematuria with clots. EXAM: CT ABDOMEN AND PELVIS WITHOUT AND WITH CONTRAST TECHNIQUE: Multidetector CT imaging of the abdomen and pelvis was performed following the standard protocol before and following the bolus administration of intravenous contrast. CONTRAST:  154mL OMNIPAQUE IOHEXOL 300 MG/ML  SOLN COMPARISON:  None. FINDINGS: Lower chest: 4 mm subpleural right middle lobe nodule, image 1 series 4.  Coronary artery calcifications. Hepatobiliary: Hepatic steatosis with focal fatty sparing adjacent the gallbladder fossa. No focal hepatic lesion. Vague gallbladder hyperdensity may be sludge or noncalcified stones. No pericholecystic inflammation. No biliary dilatation. Pancreas: No ductal dilatation or inflammation. Spleen: Normal in size without focal abnormality. Adrenals/Urinary Tract: Normal adrenal glands. Large heterogeneous enhancing mass arises from the mid anterior left kidney measuring 8.6 x 8.1 x 9.4 cm. This causes mass effect on the right renal collecting system without frank hydronephrosis. Mass abuts and compresses the renal vein at the hilum but no evidence of filling defect or venous invasion. There is bulging of left renal capsule. Mild left ureteral thickening or periureteric edema distally. No stones in either kidney. Right kidney is unremarkable. No right hydronephrosis, perinephric edema, or focal lesion. Foley catheter within the urinary bladder. No excreted contrast in the urinary bladder, which is decompressed by Foley catheter limiting assessment. There is lobulated internal hyperdensity, large blood clots versus bladder mass, not well-defined. Mild perivesicular edema anteriorly. Stomach/Bowel: Stomach is nondistended. No bowel wall thickening, inflammation, or obstruction. Appendix possibly but not definitively identified and normal. Moderate stool throughout the colon. No colonic wall thickening or inflammation. No significant diverticular disease. Mild sigmoid colonic tortuosity. Vascular/Lymphatic: Prominent portal caval node measuring 11 mm short axis, image 46 series  13. No enlarged retroperitoneal lymph nodes. Small bilateral external iliac nodes measure 5 mm and are nonspecific. There is an 8 mm right anterior pararenal node, image 77 series 13. No left perirenal adenopathy. Multiple small mesenteric nodes, not enlarged by size criteria. Mild aortic atherosclerosis without  aneurysm. Reproductive: Prostate is unremarkable. Other: No free air or free fluid. Tiny fat containing umbilical hernia. Musculoskeletal: Subcentimeter sclerotic focus in the right proximal femur is nonspecific but likely bone island. No lytic lesions. No acute osseous abnormalities. IMPRESSION: 1. Large heterogeneous enhancing mass arising from the mid anterior left kidney measuring 8.6 x 8.1 x 9.4 cm, highly suspicious for renal cell carcinoma. Mass abuts and compresses the left renal vein without evidence of filling defect or venous invasion. 2. Lobulated hyperdensity within the urinary bladder which is decompressed by Foley catheter. This may represent blood clots versus less likely lobulated bladder mass. Recommend correlation with cystoscopy. 3. Hepatic steatosis. Prominent portal caval and right anterior pararenal lymph nodes, are likely reactive but nonspecific. 4. Subpleural 4 mm pulmonary nodule in the right middle lobe. 5. Aortic Atherosclerosis (ICD10-I70.0). Coronary artery calcifications. Electronically Signed   By: Keith Rake M.D.   On: 02/05/2019 19:30   Ct Chest W Contrast  Result Date: 02/05/2019 CLINICAL DATA:  Recently diagnosed renal cell carcinoma. Acute onset of gross hematuria and urinary retention. Evaluate for lung metastases. EXAM: CT CHEST WITH CONTRAST TECHNIQUE: Multidetector CT imaging of the chest was performed during intravenous contrast administration. CONTRAST:  105mL OMNIPAQUE IOHEXOL 300 MG/ML  SOLN COMPARISON:  Chest radiograph performed 09/22/2012, and CT of the abdomen and pelvis performed earlier today at 6:57 p.m. FINDINGS: Cardiovascular: The heart is normal in size. Minimal calcification is noted at the aortic arch. The great vessels are unremarkable in appearance. Mediastinum/Nodes: The mediastinum is grossly unremarkable in appearance. No mediastinal lymphadenopathy is seen. No pericardial effusion is identified. A small peripherally calcified hypodensity at  the right thyroid lobe is likely benign, given its size. No axillary lymphadenopathy is seen. Lungs/Pleura: The lungs are clear bilaterally. No focal consolidation, pleural effusion or pneumothorax is seen. No masses are identified. There is no evidence for metastatic disease. Upper Abdomen: The visualized portions of the liver and spleen are unremarkable. The visualized portions of the gallbladder and pancreas are within normal limits. Musculoskeletal: No acute osseous abnormalities are identified. The visualized musculature is unremarkable in appearance. IMPRESSION: No evidence of metastatic disease within the chest. Unremarkable contrast-enhanced CT of the chest. Electronically Signed   By: Garald Balding M.D.   On: 02/05/2019 21:40    Assessment/Plan: -Ambulate and IS -cbc, bmet in AM -foley out in AM -on clears - awaiting flatus - cont IVF    LOS: 2 days   Charles Mathis 02/07/2019, 12:15 PM

## 2019-02-07 NOTE — Progress Notes (Signed)
After a lot of resistance and delaying, got patient up with assistance of another RN to chair. Reinforced teaching regarding incentive spirometer use and deep breathing/coughing using splint. Patient demonstrated incentive spirometer use and understanding of the need to get up OOB/ambulate and use the incentive spirometer for prevention of pneumonia and ileus.

## 2019-02-08 LAB — CBC
HCT: 22.6 % — ABNORMAL LOW (ref 39.0–52.0)
HCT: 21.9 % — ABNORMAL LOW (ref 39.0–52.0)
Hemoglobin: 7.2 g/dL — ABNORMAL LOW (ref 13.0–17.0)
Hemoglobin: 7 g/dL — ABNORMAL LOW (ref 13.0–17.0)
MCH: 27 pg (ref 26.0–34.0)
MCH: 26.7 pg (ref 26.0–34.0)
MCHC: 31.9 g/dL (ref 30.0–36.0)
MCHC: 32 g/dL (ref 30.0–36.0)
MCV: 84.6 fL (ref 80.0–100.0)
MCV: 83.6 fL (ref 80.0–100.0)
Platelets: 175 10*3/uL (ref 150–400)
Platelets: 188 10*3/uL (ref 150–400)
RBC: 2.67 MIL/uL — ABNORMAL LOW (ref 4.22–5.81)
RBC: 2.62 MIL/uL — ABNORMAL LOW (ref 4.22–5.81)
RDW: 15 % (ref 11.5–15.5)
RDW: 14.7 % (ref 11.5–15.5)
WBC: 10.9 10*3/uL — ABNORMAL HIGH (ref 4.0–10.5)
WBC: 10.4 10*3/uL (ref 4.0–10.5)
nRBC: 0 % (ref 0.0–0.2)
nRBC: 0 % (ref 0.0–0.2)

## 2019-02-08 LAB — BASIC METABOLIC PANEL
Anion gap: 7 (ref 5–15)
Anion gap: 6 (ref 5–15)
BUN: 18 mg/dL (ref 6–20)
BUN: 17 mg/dL (ref 6–20)
CO2: 26 mmol/L (ref 22–32)
CO2: 26 mmol/L (ref 22–32)
Calcium: 7.6 mg/dL — ABNORMAL LOW (ref 8.9–10.3)
Calcium: 8 mg/dL — ABNORMAL LOW (ref 8.9–10.3)
Chloride: 101 mmol/L (ref 98–111)
Chloride: 102 mmol/L (ref 98–111)
Creatinine, Ser: 1.09 mg/dL (ref 0.61–1.24)
Creatinine, Ser: 1.13 mg/dL (ref 0.61–1.24)
GFR calc Af Amer: >60 mL/min (ref >60)
GFR calc Af Amer: >60 mL/min (ref >60)
GFR calc non Af Amer: >60 mL/min (ref >60)
GFR calc non Af Amer: >60 mL/min (ref >60)
Glucose, Bld: 118 mg/dL — ABNORMAL HIGH (ref 70–99)
Glucose, Bld: 151 mg/dL — ABNORMAL HIGH (ref 70–99)
Potassium: 2.9 mmol/L — ABNORMAL LOW (ref 3.5–5.1)
Potassium: 3.3 mmol/L — ABNORMAL LOW (ref 3.5–5.1)
Sodium: 134 mmol/L — ABNORMAL LOW (ref 135–145)
Sodium: 134 mmol/L — ABNORMAL LOW (ref 135–145)

## 2019-02-08 LAB — GLUCOSE, CAPILLARY
Glucose-Capillary: 162 mg/dL — ABNORMAL HIGH (ref 70–99)
Glucose-Capillary: 107 mg/dL — ABNORMAL HIGH (ref 70–99)
Glucose-Capillary: 131 mg/dL — ABNORMAL HIGH (ref 70–99)

## 2019-02-08 MED ORDER — ACETAMINOPHEN 10 MG/ML IV SOLN
1000.0000 mg | Freq: Four times a day (QID) | INTRAVENOUS | Status: AC
  Administered 2019-02-08 – 2019-02-09 (×4): 1000 mg via INTRAVENOUS
  Filled 2019-02-08 (×4): qty 100

## 2019-02-08 MED ORDER — POTASSIUM CHLORIDE 20 MEQ PO PACK
40.0000 meq | PACK | Freq: Three times a day (TID) | ORAL | Status: AC
  Administered 2019-02-08 (×3): 40 meq via ORAL
  Filled 2019-02-08 (×3): qty 2

## 2019-02-08 MED ORDER — INSULIN ASPART 100 UNIT/ML ~~LOC~~ SOLN
0.0000 [IU] | SUBCUTANEOUS | Status: DC
  Administered 2019-02-08: 21:00:00 2 [IU] via SUBCUTANEOUS
  Administered 2019-02-08: 3 [IU] via SUBCUTANEOUS
  Filled 2019-02-08 (×2): qty 1

## 2019-02-08 MED ORDER — POTASSIUM CHLORIDE CRYS ER 10 MEQ PO TBCR
10.0000 meq | EXTENDED_RELEASE_TABLET | Freq: Every day | ORAL | Status: DC
  Administered 2019-02-08 – 2019-02-09 (×2): 10 meq via ORAL
  Filled 2019-02-08 (×2): qty 1

## 2019-02-08 MED ORDER — OXYCODONE HCL 5 MG PO TABS: 5.0000 mg | ORAL_TABLET | ORAL | Status: DC | PRN

## 2019-02-08 NOTE — Evaluation (Signed)
Physical Therapy Evaluation Patient Details Name: Charles Mathis MRN: [id number] DOB: 11/14/1970 Today's Date: 02/08/2019   History of Present Illness  Charles Mathis comes to [facility] 2/2 gross hematuria, noted to have a "tumor" of the kidney and taken to surgery for nephrectomy on 7/24.  Clinical Impression  Pt admitted with above diagnosis. Pt currently with functional limitations due to the deficits listed below (see "PT Problem List"). Upon entry, pt in chair, awake and agreeable to participate. The pt is alert and oriented x4, pleasant, conversational, and generally a good historian. Pain well controlled at rest 3/10, but with movement, much more  limiting "6-5/10" or higher. RW does help with pain control during AMB. Despite Hb: 7.2, pt denies symptoms of anemia with mobility, albeit he is moving at a low metabolic level. Min-modA required for assisting legs back into bed, otherwise, supervision only for basic mobility. AMB limited to less than 143ft, pt encouraged to AMB again withnursing later today. Functional mobility assessment demonstrates increased effort/time requirements, poor tolerance, and need for physical assistance, whereas the patient performed these at a higher level of independence PTA.    Pt will benefit from skilled PT intervention to increase independence and safety with basic mobility in preparation for discharge to the venue listed below.       Follow Up Recommendations No PT follow up    Equipment Recommendations  Rolling walker with 5" wheels    Recommendations for Other Services       Precautions / Restrictions Precautions Precautions: None Precaution Comments: anterior surgical incision.      Mobility  Bed Mobility Overal bed mobility: Needs Assistance Bed Mobility: Sit to Supine       Sit to supine: Mod assist   General bed mobility comments: unable to lift legs into bed, attempt log roll education several times, but pt motor patterns ultimately  dominated by pain and npt following cues for transition from sitting to sideling, but rolling immediately when legs are assisted.  Transfers Overall transfer level: Needs assistance Equipment used: Rolling walker (2 wheeled) Transfers: Sit to/from Stand Sit to Stand: Supervision;Modified independent (Device/Increase time)         General transfer comment: severe pain increases, make this slow and difficult  Ambulation/Gait Ambulation/Gait assistance: Supervision;Min guard Gait Distance (Feet): 110 Feet Assistive device: Rolling walker (2 wheeled);None(for pain management) Gait Pattern/deviations: WFL(Within Functional Limits);Antalgic     General Gait Details: slow 2/2 pain; denies symptoms associated with anemia.  Stairs            Wheelchair Mobility    Modified Rankin (Stroke Patients Only)       Balance Overall balance assessment: Modified Independent;No apparent balance deficits (not formally assessed)                                           Pertinent Vitals/Pain Pain Assessment: 0-10 Pain Score: 5  Pain Location: near surgical incission during AMB/transfers Pain Descriptors / Indicators: Operative site guarding Pain Intervention(s): Limited activity within patient's tolerance;Monitored during session;Premedicated before session;Repositioned    Home Living Family/patient expects to be discharged to:: Private residence Living Arrangements: Spouse/significant other;Children Available Help at Discharge: Family Type of Home: Apartment Home Access: Stairs to enter   Technical brewer of Steps: 1 Home Layout: One level Home Equipment: None Additional Comments: fully independent community AMB, works with Orthoptist    Prior Function  Level of Independence: Independent               Hand Dominance        Extremity/Trunk Assessment   Upper Extremity Assessment Upper Extremity Assessment: Overall WFL for tasks  assessed    Lower Extremity Assessment Lower Extremity Assessment: Overall WFL for tasks assessed    Cervical / Trunk Assessment Cervical / Trunk Assessment: Normal  Communication   Communication: No difficulties  Cognition Arousal/Alertness: Awake/alert Behavior During Therapy: WFL for tasks assessed/performed Overall Cognitive Status: Within Functional Limits for tasks assessed                                        General Comments      Exercises     Assessment/Plan    PT Assessment Patient needs continued PT services  PT Problem List Decreased strength;Decreased activity tolerance;Decreased mobility;Decreased knowledge of precautions       PT Treatment Interventions DME instruction;Gait training;Functional mobility training;Stair training;Therapeutic activities;Therapeutic exercise;Patient/family education    PT Goals (Current goals can be found in the Care Plan section)  Acute Rehab PT Goals Patient Stated Goal: improve AMB tolerance PT Goal Formulation: With patient Time For Goal Achievement: 02/22/19 Potential to Achieve Goals: Good    Frequency Min 2X/week   Barriers to discharge        Co-evaluation               AM-PAC PT "6 Clicks" Mobility  Outcome Measure Help needed turning from your back to your side while in a flat bed without using bedrails?: A Rail Help needed moving from lying on your back to sitting on the side of a flat bed without using bedrails?: A Weight Help needed moving to and from a bed to a chair (including a wheelchair)?: A Romig Help needed standing up from a chair using your arms (e.g., wheelchair or bedside chair)?: A Ellzey Help needed to walk in hospital room?: A Swager Help needed climbing 3-5 steps with a railing? : A Wiker 6 Click Score: 18    End of Session Equipment Utilized During Treatment: Oxygen Activity Tolerance: Patient limited by pain Patient left: in bed;with call bell/phone within  reach Nurse Communication: Mobility status PT Visit Diagnosis: Difficulty in walking, not elsewhere classified (R26.2);Other abnormalities of gait and mobility (R26.89)    Time: 8250-0370 PT Time Calculation (min) (ACUTE ONLY): 18 min   Charges:   PT Evaluation $PT Eval Low Complexity: 1 Low          2:09 PM, 02/08/19 Etta Grandchild, PT, DPT Physical Therapist - [facility]  [phone] ([place])   Ulyana Pitones C 02/08/2019, 2:06 PM

## 2019-02-08 NOTE — Progress Notes (Addendum)
2 Days Post-Op Subjective: Complaining of pain in his abdomen when moving No complaints of nausea Tolerating clear liquids Up to chair this AM, was also yesterday.   Objective: Vital signs in last 24 hours: Temp:  [98.2 F (36.8 C)-99.3 F (37.4 C)] 98.2 F (36.8 C) (07/26 0430) Pulse Rate:  [75-82] 75 (07/26 0430) Resp:  [16-18] 18 (07/26 0430) BP: (126-148)/(65-76) 126/66 (07/26 0430) SpO2:  [91 %-96 %] 96 % (07/26 0430)  Intake/Output from previous day: 07/25 0701 - 07/26 0700 In: 1435.5 [I.V.:1435.5] Out: 2400 [Urine:2400] Intake/Output this shift: Total I/O In: 2078.3 [I.V.:2078.3] Out: 750 [Urine:750]  Physical Exam:  NAD Alert and oriented Watching TV CV _ RRR Resp - reg effort and depth Abd - soft, NT, incisions c/d/i, softly distended GU - foley in place, urine clear.    Lab Results: Recent Labs    02/06/19 1348 02/06/19 1511 02/08/19 0333  HGB 9.3* 9.0* 7.2*  HCT 29.1* 28.4* 22.6*   BMET Recent Labs    02/08/19 0333  NA 134*  K 2.9*  CL 101  CO2 26  GLUCOSE 118*  BUN 18  CREATININE 1.09  CALCIUM 7.6*   Recent Labs    02/05/19 2052  INR 1.1   No results for input(s): LABURIN in the last 72 hours. Results for orders placed or performed during the hospital encounter of 02/05/19  Urine culture     Status: None   Collection Time: 02/05/19  5:37 PM   Specimen: Urine, Catheterized  Result Value Ref Range Status   Specimen Description   Final    URINE, CATHETERIZED Performed at [facility], [address] [place], [place] [address]    Special Requests   Final    Normal Performed at [facility], [address], [place], [place] [address]    Culture   Final    NO GROWTH Performed at [facility], [place] [address], [place],  [address]    Report Status 02/07/2019 FINAL  Final  SARS Coronavirus 2 (CEPHEID - Performed in [address] hospital lab), Hosp Order     Status: None   Collection Time: 02/05/19   8:52 PM   Specimen: Nasopharyngeal Swab  Result Value Ref Range Status   SARS Coronavirus 2 NEGATIVE NEGATIVE Final    Comment: (NOTE) If result is NEGATIVE SARS-CoV-2 target nucleic acids are NOT DETECTED. The SARS-CoV-2 RNA is generally detectable in upper and lower  respiratory specimens during the acute phase of infection. The lowest  concentration of SARS-CoV-2 viral copies this assay can detect is 250  copies / mL. A negative result does not preclude SARS-CoV-2 infection  and should not be used as the sole basis for treatment or other  patient management decisions.  A negative result may occur with  improper specimen collection / handling, submission of specimen other  than nasopharyngeal swab, presence of viral mutation(s) within the  areas targeted by this assay, and inadequate number of viral copies  (<250 copies / mL). A negative result must be combined with clinical  observations, patient history, and epidemiological information. If result is POSITIVE SARS-CoV-2 target nucleic acids are DETECTED. The SARS-CoV-2 RNA is generally detectable in upper and lower  respiratory specimens dur ing the acute phase of infection.  Positive  results are indicative of active infection with SARS-CoV-2.  Clinical  correlation with patient history and other diagnostic information is  necessary to determine patient infection status.  Positive results do  not rule out bacterial  infection or co-infection with other viruses. If result is PRESUMPTIVE POSTIVE SARS-CoV-2 nucleic acids MAY BE PRESENT.   A presumptive positive result was obtained on the submitted specimen  and confirmed on repeat testing.  While 2019 novel coronavirus  (SARS-CoV-2) nucleic acids may be present in the submitted sample  additional confirmatory testing may be necessary for epidemiological  and / or clinical management purposes  to differentiate between  SARS-CoV-2 and other Sarbecovirus currently known to infect  humans.  If clinically indicated additional testing with an alternate test  methodology [phone]) is advised. The SARS-CoV-2 RNA is generally  detectable in upper and lower respiratory sp ecimens during the acute  phase of infection. The expected result is Negative. Fact Sheet for Patients:  StrictlyIdeas.no Fact Sheet for Healthcare Providers: BankingDealers.co.za This test is not yet approved or cleared by the [place] FDA and has been authorized for detection and/or diagnosis of SARS-CoV-2 by FDA under an Emergency Use Authorization (EUA).  This EUA will remain in effect (meaning this test can be used) for the duration of the COVID-19 declaration under Section 564(b)(1) of the Act, 21 U.S.C. section 360bbb-3(b)(1), unless the authorization is terminated or revoked sooner. Performed at [facility], [address], [place], [place] [address]   Surgical PCR screen     Status: None   Collection Time: 02/06/19  4:31 AM   Specimen: Nasal Mucosa; Nasal Swab  Result Value Ref Range Status   MRSA, PCR NEGATIVE NEGATIVE Final   Staphylococcus aureus NEGATIVE NEGATIVE Final    Comment: (NOTE) The Xpert SA Assay (FDA approved for NASAL specimens in patients 45 years of age and older), is one component of a comprehensive surveillance program. It is not intended to diagnose infection nor to guide or monitor treatment. Performed at [facility], [address], [place],  [address]     Studies/Results: No results found.  Assessment/Plan: Doing well post-op with expected pain.  Mild distention of abdomen.  Anemia from acute surgical blood loss -  Hgb dropped this AM which I suspect is a result of fluid shift as he does not appear to be bleeding.  His potassium is low, which is a chronic problem for him.    - HLIVF - increase pain medication - Oxycodone 5-10 mg q6, added IV Tylenol - replete K today - repeat  labs this PM - slow diet advance - encourage ambulation - d/c foley - sliding scale insulin, SQH  Not ready for discharge this AM, possibly tomorrow.    LOS: 3 days   Ardis Hughs 02/08/2019, 11:06 AM

## 2019-02-09 ENCOUNTER — Encounter: Payer: Self-pay | Admitting: Radiology

## 2019-02-09 ENCOUNTER — Telehealth: Payer: Self-pay | Admitting: Urology

## 2019-02-09 LAB — CBC
HCT: 23.5 % — ABNORMAL LOW (ref 39.0–52.0)
Hemoglobin: 7.4 g/dL — ABNORMAL LOW (ref 13.0–17.0)
MCH: 26.2 pg (ref 26.0–34.0)
MCHC: 31.5 g/dL (ref 30.0–36.0)
MCV: 83.3 fL (ref 80.0–100.0)
Platelets: 226 10*3/uL (ref 150–400)
RBC: 2.82 MIL/uL — ABNORMAL LOW (ref 4.22–5.81)
RDW: 14.5 % (ref 11.5–15.5)
WBC: 10.5 10*3/uL (ref 4.0–10.5)
nRBC: 0 % (ref 0.0–0.2)

## 2019-02-09 LAB — BASIC METABOLIC PANEL
Anion gap: 9 (ref 5–15)
BUN: 17 mg/dL (ref 6–20)
CO2: 26 mmol/L (ref 22–32)
Calcium: 8.3 mg/dL — ABNORMAL LOW (ref 8.9–10.3)
Chloride: 101 mmol/L (ref 98–111)
Creatinine, Ser: 1.25 mg/dL — ABNORMAL HIGH (ref 0.61–1.24)
GFR calc Af Amer: >60 mL/min (ref >60)
GFR calc non Af Amer: >60 mL/min (ref >60)
Glucose, Bld: 126 mg/dL — ABNORMAL HIGH (ref 70–99)
Potassium: 3.3 mmol/L — ABNORMAL LOW (ref 3.5–5.1)
Sodium: 136 mmol/L (ref 135–145)

## 2019-02-09 LAB — GLUCOSE, CAPILLARY
Glucose-Capillary: 108 mg/dL — ABNORMAL HIGH (ref 70–99)
Glucose-Capillary: 119 mg/dL — ABNORMAL HIGH (ref 70–99)
Glucose-Capillary: 117 mg/dL — ABNORMAL HIGH (ref 70–99)

## 2019-02-09 MED ORDER — POTASSIUM CHLORIDE 20 MEQ PO PACK
40.0000 meq | PACK | Freq: Once | ORAL | Status: AC
  Administered 2019-02-09: 09:00:00 40 meq via ORAL
  Filled 2019-02-09: qty 2

## 2019-02-09 MED ORDER — OXYCODONE HCL 5 MG PO TABS: 5.0000 mg | ORAL_TABLET | ORAL | 0 refills | Status: AC | PRN

## 2019-02-09 MED ORDER — DOCUSATE SODIUM 100 MG PO CAPS: 100.0000 mg | ORAL_CAPSULE | Freq: Two times a day (BID) | ORAL | 0 refills | Status: DC

## 2019-02-09 MED ORDER — OXYCODONE HCL 5 MG PO TABS: 5.0000 mg | ORAL_TABLET | ORAL | 0 refills | Status: DC | PRN

## 2019-02-09 NOTE — Telephone Encounter (Signed)
-----   Message from Billey Co, MD sent at 02/09/2019  8:15 AM EDT ----- Regarding: follow up Please schedule follow up in 2-3 weeks for wound check and discuss pathology results, ok to overbook.  Nickolas Madrid, MD 02/09/2019

## 2019-02-09 NOTE — Telephone Encounter (Signed)
Done

## 2019-02-09 NOTE — Discharge Summary (Signed)
Physician Discharge Summary  Patient ID: Charles Mathis MRN: [id number] DOB/AGE: 03-15-1971 48 y.o.  Admit date: 02/05/2019 Discharge date: 02/09/2019  Admission Diagnoses: 10 cm left renal mass and gross hematuria  Discharge Diagnoses:  Active Problems:   Left renal mass   Discharged Condition: good  Hospital Course:  Charles Mathis is a 48 year old male that presented to the hospital on the evening of 7/23 with gross hematuria and clot retention, and CT scan demonstrating a 10 cm enhancing left renal mass worrisome for renal cell carcinoma.  Staging imaging showed no evidence of metastatic disease.  On 7/24 he underwent left hand-assisted laparoscopic radical nephrectomy and cystoscopy.  His postoperative course was uncomplicated and his diet was advanced as tolerated.  Prior to discharge, he was passing flatus, tolerating a general diet, and his pain was well controlled.  Treatments:  Left hand-assisted laparoscopic radical nephrectomy  Discharge Exam: Blood pressure (!) 145/80, pulse 73, temperature 98.2 F (36.8 C), temperature source Oral, resp. rate 18, height 5\' 7"  (1.702 m), weight 107.7 kg, SpO2 95 %.  Alert, no acute distress Abdomen soft, appropriately tender, nondistended Incisions clean dry intact with Dermabond Voiding clear yellow urine spontaneously  Disposition: Discharge disposition: 01-Home or Self Care       Discharge Instructions    Discharge instructions   Complete by: As directed    Drink plenty of fluids to keep your remaining kidney flushed. Eat healthy small meals. Walk at least 3-4 times per day to prevent blood clots in the legs. No lifting >15lbs for 4 weeks to prevent hernia. OK to shower, no tub baths for 2 weeks. Use your incentive spirometer at least 5x per day to prevent pneumonia.  Call the urology clinic if you have fever over 101.3, uncontrolled pain, or nausea and vomiting.     Allergies as of 02/09/2019   No Known Allergies      Medication List    STOP taking these medications   cephALEXin 500 MG capsule Commonly known as: KEFLEX     TAKE these medications   acetaminophen 325 MG tablet Commonly known as: TYLENOL Take by mouth every 6 (six) hours as needed for headache.   amLODipine-olmesartan 5-40 MG tablet Commonly known as: AZOR Take 1 tablet by mouth daily.   aspirin EC 81 MG tablet Take 81 mg by mouth daily.   atorvastatin 40 MG tablet Commonly known as: LIPITOR Take 1 tablet (40 mg total) by mouth daily.   carvedilol 25 MG tablet Commonly known as: COREG TAKE 1 TABLET TWICE A DAY WITH A MEAL What changed:   how much to take  how to take this  when to take this   cetirizine 10 MG tablet Commonly known as: ZYRTEC Take 10 mg by mouth daily.   chlorthalidone 25 MG tablet Commonly known as: HYGROTON Take 0.5 tablets (12.5 mg total) by mouth daily.   docusate sodium 100 MG capsule Commonly known as: COLACE Take 1 capsule (100 mg total) by mouth 2 (two) times daily.   fluticasone 50 MCG/ACT nasal spray Commonly known as: FLONASE Place 2 sprays into both nostrils daily.   levothyroxine 100 MCG tablet Commonly known as: SYNTHROID Take 1 tablet (100 mcg total) by mouth daily.   metFORMIN 500 MG tablet Commonly known as: GLUCOPHAGE Take 1 tablet (500 mg total) by mouth 2 (two) times daily with a meal.   oxyCODONE 5 MG immediate release tablet Commonly known as: Oxy IR/ROXICODONE Take 1-2 tablets (5-10 mg total) by mouth every  4 (four) hours as needed for up to 7 days for moderate pain.   potassium chloride 10 MEQ tablet Commonly known as: K-DUR Take 1 tablet (10 mEq total) by mouth daily.   pseudoephedrine 30 MG tablet Commonly known as: SUDAFED Take 30 mg by mouth every 4 (four) hours as needed for congestion.        Signed: Billey Co 02/09/2019, 8:16 AM

## 2019-02-13 ENCOUNTER — Other Ambulatory Visit: Payer: Self-pay | Admitting: Anatomic Pathology & Clinical Pathology

## 2019-02-13 LAB — SURGICAL PATHOLOGY

## 2019-02-19 ENCOUNTER — Other Ambulatory Visit: Payer: Self-pay

## 2019-02-19 NOTE — Progress Notes (Signed)
Tumor Board Documentation  Charles Mathis was presented by Dr Erlene Quan at our Tumor Board on 02/19/2019, which included representatives from medical oncology, radiation oncology, pathology, radiology, surgical, navigation, internal medicine, research, palliative care.  Charles Mathis currently presents for [facility], for new positive pathology with history of the following treatments: surgical intervention(s).  Additionally, we reviewed previous medical and familial history, history of present illness, and recent lab results along with all available histopathologic and imaging studies. The tumor board considered available treatment options and made the following recommendations: Adjuvant chemotherapy Refer to oncology  The following procedures/referrals were also placed: No orders of the defined types were placed in this encounter.   Clinical Trial Status: not discussed   Staging used: AJCC Stage Group  AJCC Staging: T: 3a N: x   Group: Rena Cell Carcinoma Clear Cell   National site-specific guidelines NCCN were discussed with respect to the case.  Tumor board is a meeting of clinicians from various specialty areas who evaluate and discuss patients for whom a multidisciplinary approach is being considered. Final determinations in the plan of care are those of the provider(s). The responsibility for follow up of recommendations given during tumor board is that of the provider.   Today's extended care, comprehensive team conference, Charles Mathis was not present for the discussion and was not examined.   Multidisciplinary Tumor Board is a multidisciplinary case peer review process.  Decisions discussed in the Multidisciplinary Tumor Board reflect the opinions of the specialists present at the conference without having examined the patient.  Ultimately, treatment and diagnostic decisions rest with the primary provider(s) and the patient.

## 2019-02-23 ENCOUNTER — Other Ambulatory Visit: Payer: Self-pay

## 2019-02-23 ENCOUNTER — Encounter: Payer: Self-pay | Admitting: Urology

## 2019-02-23 ENCOUNTER — Ambulatory Visit (INDEPENDENT_AMBULATORY_CARE_PROVIDER_SITE_OTHER): Payer: Self-pay | Admitting: Urology

## 2019-02-23 VITALS — BP 169/84 | HR 82 | Ht 67.0 in | Wt 238.0 lb

## 2019-02-23 DIAGNOSIS — C642 Malignant neoplasm of left kidney, except renal pelvis: Secondary | ICD-10-CM

## 2019-02-23 NOTE — Progress Notes (Signed)
   02/23/2019 11:21 AM   Charles Mathis 01-17-1971 [id number]  Reason for visit: Follow up left nephrectomy for RCC  HPI: I saw Charles Mathis back in urology clinic today for follow-up of kidney cancer.  He is a healthy 48 year old male that presented to the ED on 02/05/2019 with gross hematuria and clot retention, and an enhancing left 10 cm renal mass.  He underwent uncomplicated left laparoscopic hand-assisted radical nephrectomy on 02/06/2019, as well as cystoscopy that showed no concerning bladder lesions.   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  He reports he is doing very well since surgery and denies any complaints today.  He is having normal bowel movements and tolerating a general diet.  He has minimal pain that is well controlled with Tylenol.  He denies any gross hematuria or flank pain.   ROS: Please see flowsheet from today's date for complete review of systems.  Physical Exam: BP (!) 169/84   Pulse 82   Ht 5\' 7"  (1.702 m)   Wt 238 lb (108 kg)   BMI 37.28 kg/m    Constitutional:  Alert and oriented, No acute distress. Respiratory: Normal respiratory effort, no increased work of breathing. GI: Abdomen is soft, nontender, nondistended, no abdominal masses.  Midline upper abdominal incision and port sites healing well, no erythema or induration GU: No CVA tenderness Skin: No rashes, bruises or suspicious lesions. Neurologic: Grossly intact, no focal deficits, moving all 4 extremities. Psychiatric: Normal mood and affect  Laboratory Data: Reviewed  Assessment & Plan:   In summary, the patient is a 48 year old male that originally presented to the emergency department on 02/05/2019 with gross hematuria and clot retention, and CT demonstrating an enhancing left 10 cm renal mass.  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  He is recovering well from surgery.  We discussed at length the risks of recurrence, and need for close surveillance in the future every 6 months for the next 3 years, then on a yearly basis.  We also discussed consideration of adjuvant therapy for improvement in disease-free survival, and I placed a referral to medical oncology for further discussion of this.  -RTC 6 months with CT chest abdomen and pelvis, will image every 6 months for the next 3 years with CT abdomen pelvis and chest x-ray, then yearly -Referral placed to medical oncology for consideration of adjuvant therapy  A total of 15 minutes were spent face-to-face with the patient, greater than 50% was spent in patient education, counseling, and coordination of care regarding kidney cancer, postop expectations, and need for close surveillance.   Billey Co, [facility] [address] [place], [place] [address] [phone]

## 2019-02-23 NOTE — Patient Instructions (Signed)
Kidney Cancer  Kidney cancer is an abnormal growth of cells in one or both kidneys. The kidneys filter waste from your blood and produce urine. Kidney cancer may spread to other parts of your body. This type of cancer may also be called renal cell carcinoma. What are the causes? The cause of this condition is not always known. In some cases, abnormal changes to genes (genetic mutations) can cause cells to form cancer. What increases the risk? You may be more likely to develop kidney cancer if you:  Are over age 60. The risk increases with age.  Have a family history of kidney cancer.  Are of African-American, Native American, or Native [place] descent.  Smoke.  Are male.  Are obese.  Have high blood pressure (hypertension).  Have advanced kidney disease, especially if you need long-term dialysis.  Have certain conditions that are passed from parent to child (inherited), such as von Hippel-Lindau disease, tuberous sclerosis, or hereditary papillary renal carcinoma.  Have been exposed to certain chemicals. What are the signs or symptoms? In the early stages, kidney cancer does not cause symptoms. As the cancer grows, symptoms may include:  Blood in the urine.  Pain in the upper back or abdomen, just below the rib cage. You may feel pain on one or both sides of the body.  Fatigue.  Unexplained weight loss.  Fever. How is this diagnosed? This condition may be diagnosed based on:  Your symptoms and medical history.  A physical exam.  Blood and urine tests.  X-rays.  Imaging tests, such as CT scans, MRIs, and PET scans.  Having dye injected into your blood through an IV, and then having X-rays taken of: ? Your kidneys and the rest of the organs involved in making and storing urine (intravenous pyelogram). ? Your blood vessels (angiogram).  Removal and testing of a kidney tissue sample (biopsy). Your cancer will be assessed (staged), based on how severe it is and how  much it has spread. How is this treated? Treatment depends on the type and stage of the cancer. Treatment may include one or more of the following:  Surgery. This may include surgery to remove: ? Just the tumor (nephron-sparing surgery). ? The entire kidney (nephrectomy). ? The kidney, some of the surrounding healthy tissue, nearby lymph nodes, and the adrenal gland in certain cases (radical nephrectomy).  Medicines that kill cancer cells (chemotherapy).  High-energy rays that kill cancer cells (radiation therapy).  Targeted therapy. This targets specific parts of cancer cells and the area around them to block the growth and the spread of the cancer. Targeted therapy can help to limit the damage to healthy cells.  Medicines that help your body's disease-fighting system (immune system) fight cancer cells (immunotherapy).  Freezing cancer cells using gas or liquid that is delivered through a needle (cryoablation).  Destroying cancer cells using high-energy radio waves that are delivered through a needle-like probe (radiofrequency ablation).  A procedure to block the artery that supplies blood to the tumor, which kills the cancer cells (embolization). Follow these instructions at home: Eating and drinking  Some of your treatments might affect your appetite and your ability to chew and swallow. If you are having problems eating, or if you do not have an appetite, meet with a diet and nutrition specialist (dietitian).  If you have side effects that affect eating, it may help to: ? Eat smaller meals and snacks often. ? Drink high-nutrition and high-calorie shakes or supplements. ? Eat bland and soft   foods that are easy to eat. ? Not eat foods that are hot, spicy, or hard to swallow. Lifestyle  Do not drink alcohol.  Do not use any products that contain nicotine or tobacco, such as cigarettes and e-cigarettes. If you need help quitting, ask your health care provider. General  instructions   Take over-the-counter and prescription medicines only as told by your health care provider. This includes vitamins, supplements, and herbal products.  Consider joining a support group to help you cope with the stress of having kidney cancer.  Work with your health care provider to manage any side effects of treatment.  Keep all follow-up visits as told by your health care provider. This is important. Where to find more information  American Cancer Society: https://www.cancer.[place] ([place] https://www.cancer.gov Contact a health care provider if you:  Notice that you bruise or bleed easily.  Are losing weight without trying.  Have new or increased fatigue or weakness. Get help right away if you have:  Blood in your urine.  A sudden increase in pain.  A fever.  Shortness of breath.  Chest pain.  Yellow skin or whites of your eyes (jaundice). Summary  Kidney cancer is an abnormal growth of cells (tumor) in one or both kidneys. Tumors may spread to other parts of your body.  In the early stages, kidney cancer does not cause symptoms. As the cancer grows, symptoms may include blood in the urine, pain in the upper back or abdomen, unexplained weight loss, fatigue, and fever.  Treatment depends on the type and stage of the cancer. It may include surgery to remove the tumor, procedures and medicines to kill the cancer cells, or medicines to help your body fight cancer cells. This information is not intended to replace advice given to you by your health care provider. Make sure you discuss any questions you have with your health care provider. Document Released: 05/12/2004 Document Revised: 07/24/2017 Document Reviewed: 07/21/2017 Elsevier Patient Education  2020 [employer].

## 2019-02-25 ENCOUNTER — Other Ambulatory Visit: Payer: Self-pay

## 2019-02-26 ENCOUNTER — Other Ambulatory Visit: Payer: Self-pay

## 2019-02-26 ENCOUNTER — Encounter: Payer: Self-pay | Admitting: Internal Medicine

## 2019-02-26 ENCOUNTER — Inpatient Hospital Stay: Payer: Self-pay | Attending: Internal Medicine | Admitting: Internal Medicine

## 2019-02-26 ENCOUNTER — Inpatient Hospital Stay: Payer: Self-pay

## 2019-02-26 DIAGNOSIS — Z905 Acquired absence of kidney: Secondary | ICD-10-CM | POA: Insufficient documentation

## 2019-02-26 DIAGNOSIS — I10 Essential (primary) hypertension: Secondary | ICD-10-CM | POA: Insufficient documentation

## 2019-02-26 DIAGNOSIS — D500 Iron deficiency anemia secondary to blood loss (chronic): Secondary | ICD-10-CM | POA: Insufficient documentation

## 2019-02-26 DIAGNOSIS — Z85528 Personal history of other malignant neoplasm of kidney: Secondary | ICD-10-CM | POA: Insufficient documentation

## 2019-02-26 DIAGNOSIS — C642 Malignant neoplasm of left kidney, except renal pelvis: Secondary | ICD-10-CM | POA: Insufficient documentation

## 2019-02-26 DIAGNOSIS — Z7984 Long term (current) use of oral hypoglycemic drugs: Secondary | ICD-10-CM | POA: Insufficient documentation

## 2019-02-26 DIAGNOSIS — R5383 Other fatigue: Secondary | ICD-10-CM | POA: Insufficient documentation

## 2019-02-26 DIAGNOSIS — Z7982 Long term (current) use of aspirin: Secondary | ICD-10-CM | POA: Insufficient documentation

## 2019-02-26 DIAGNOSIS — R319 Hematuria, unspecified: Secondary | ICD-10-CM | POA: Insufficient documentation

## 2019-02-26 DIAGNOSIS — R809 Proteinuria, unspecified: Secondary | ICD-10-CM | POA: Insufficient documentation

## 2019-02-26 DIAGNOSIS — E119 Type 2 diabetes mellitus without complications: Secondary | ICD-10-CM | POA: Insufficient documentation

## 2019-02-26 DIAGNOSIS — R5381 Other malaise: Secondary | ICD-10-CM | POA: Insufficient documentation

## 2019-02-26 DIAGNOSIS — Z79899 Other long term (current) drug therapy: Secondary | ICD-10-CM | POA: Insufficient documentation

## 2019-02-26 LAB — COMPREHENSIVE METABOLIC PANEL
ALT: 23 U/L (ref 0–44)
AST: 23 U/L (ref 15–41)
Albumin: 3.8 g/dL (ref 3.5–5.0)
Alkaline Phosphatase: 79 U/L (ref 38–126)
Anion gap: 11 (ref 5–15)
BUN: 22 mg/dL — ABNORMAL HIGH (ref 6–20)
CO2: 27 mmol/L (ref 22–32)
Calcium: 9.2 mg/dL (ref 8.9–10.3)
Chloride: 98 mmol/L (ref 98–111)
Creatinine, Ser: 1.37 mg/dL — ABNORMAL HIGH (ref 0.61–1.24)
GFR calc Af Amer: >60 mL/min (ref >60)
GFR calc non Af Amer: >60 mL/min (ref >60)
Glucose, Bld: 162 mg/dL — ABNORMAL HIGH (ref 70–99)
Potassium: 3.4 mmol/L — ABNORMAL LOW (ref 3.5–5.1)
Sodium: 136 mmol/L (ref 135–145)
Total Bilirubin: 0.5 mg/dL (ref 0.3–1.2)
Total Protein: 7.8 g/dL (ref 6.5–8.1)

## 2019-02-26 LAB — CBC WITH DIFFERENTIAL/PLATELET
Abs Immature Granulocytes: 0.02 10*3/uL (ref 0.00–0.07)
Basophils Absolute: 0 10*3/uL (ref 0.0–0.1)
Basophils Relative: 1 %
Eosinophils Absolute: 0.5 10*3/uL (ref 0.0–0.5)
Eosinophils Relative: 8 %
HCT: 26.7 % — ABNORMAL LOW (ref 39.0–52.0)
Hemoglobin: 8.3 g/dL — ABNORMAL LOW (ref 13.0–17.0)
Immature Granulocytes: 0 %
Lymphocytes Relative: 24 %
Lymphs Abs: 1.7 10*3/uL (ref 0.7–4.0)
MCH: 25.3 pg — ABNORMAL LOW (ref 26.0–34.0)
MCHC: 31.1 g/dL (ref 30.0–36.0)
MCV: 81.4 fL (ref 80.0–100.0)
Monocytes Absolute: 0.8 10*3/uL (ref 0.1–1.0)
Monocytes Relative: 11 %
Neutro Abs: 4.1 10*3/uL (ref 1.7–7.7)
Neutrophils Relative %: 56 %
Platelets: 316 10*3/uL (ref 150–400)
RBC: 3.28 MIL/uL — ABNORMAL LOW (ref 4.22–5.81)
RDW: 14.2 % (ref 11.5–15.5)
WBC: 7.1 10*3/uL (ref 4.0–10.5)
nRBC: 0 % (ref 0.0–0.2)

## 2019-02-26 LAB — LACTATE DEHYDROGENASE: LDH: 93 U/L — ABNORMAL LOW (ref 98–192)

## 2019-02-26 NOTE — Progress Notes (Signed)
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 Sniskinski]; July CT 2020- mid anterior left kidney measuring 8.6 x 8.1 x 9.4 cm; CT chest negative   # DM- 2- OHA/ HTN  DIAGNOSIS: Left kidney cancer  STAGE: 3       ;GOALS: Cure  CURRENT/MOST RECENT THERAPY: Pending    Cancer of left kidney ([place])  02/26/2019 Initial Diagnosis   Cancer of left kidney ([facility])      HISTORY OF PRESENTING ILLNESS:  Lauri Till Mehan 48 y.o.  male with a history of hypertension and no other significant medical history recently diagnosed left kidney cancer has been referred to [place] for further recommendations for adjuvant therapy.  Patient states that he noted to have fairly sudden onset of left flank pain/hematuria approximately 3 to 4 weeks ago.  He was evaluated in the emergency room that showed a- approximately 9 x 8 cm mass in the left kidney.  Patient subsequently underwent left radical nephrectomy.  Patient is healing from surgery well.  No postoperative complications.  Currently denies any abdominal pain nausea vomiting.  Appetite is good.  Is gaining strength back.  Complains of mild to moderate fatigue.  Review of Systems  Constitutional: Positive for malaise/fatigue. Negative for chills, diaphoresis, fever and weight loss.  HENT: Negative for nosebleeds and sore throat.   Eyes: Negative for double vision.  Respiratory: Negative for cough, hemoptysis, sputum production, shortness of breath and wheezing.   Cardiovascular: Negative for chest pain, palpitations, orthopnea and leg swelling.  Gastrointestinal: Negative for abdominal pain, blood in stool, constipation, diarrhea, heartburn, melena, nausea and vomiting.   Genitourinary: Negative for dysuria, frequency and urgency.  Musculoskeletal: Negative for back pain and joint pain.  Skin: Negative.  Negative for itching and rash.  Neurological: Negative for dizziness, tingling, focal weakness, weakness and headaches.  Endo/Heme/Allergies: Does not bruise/bleed easily.  Psychiatric/Behavioral: Negative for depression. The patient is not nervous/anxious and does not have insomnia.      MEDICAL HISTORY:  Past Medical History:  Diagnosis Date  . Allergy   . Diabetes mellitus without complication ([place])   . Hypertension     SURGICAL HISTORY: Past Surgical History:  Procedure Laterality Date  . CYSTOSCOPY  02/06/2019   Procedure: CYSTOSCOPY;  Surgeon: Billey Co, MD;  Location: [facility] ORS;  Service: Urology;;  . LAPAROSCOPIC NEPHRECTOMY, HAND ASSISTED Left 02/06/2019   Procedure: HAND ASSISTED LAPAROSCOPIC LEFT RADICAL NEPHRECTOMY;  Surgeon: Billey Co, MD;  Location: [facility] ORS;  Service: Urology;  Laterality: Left;  . NO PAST SURGERIES      SOCIAL HISTORY: Social History   Socioeconomic History  . Marital status: Married    Spouse name: Not on file  . Number of children: Not on file  . Years of education: Not on file  . Highest education level: Not on file  Occupational History  . Not on file  Social Needs  . Financial resource strain: Not on file  . Food insecurity    Worry: Not on file    Inability: Not on file  . Transportation needs    Medical: Not on file    Non-medical: Not on file  Tobacco Use  . Smoking status: Never Smoker  . Smokeless tobacco: Never Used  Substance and Sexual Activity  . Alcohol use: No  . Drug use: No  . Sexual activity: Yes    Birth control/protection: Other-see comments    Comment: mutually monogamous relationship partner postmenopausal  Lifestyle  . Physical activity    Days per week: Not on file    Minutes per session: Not on file  . Stress: Not on file  Relationships  . Social  Herbalist on phone: Not on file    Gets together: Not on file    Attends religious service: Not on file    Active member of club or organization: Not on file    Attends meetings of clubs or organizations: Not on file    Relationship status: Not on file  . Intimate partner violence    Fear of current or ex partner: Not on file    Emotionally abused: Not on file    Physically abused: Not on file    Forced sexual activity: Not on file  Other Topics Concern  . Not on file  Social History Narrative   In [place]/ with wife; son Cherlyn Cushing syndrome]; no smoking/ no alcohol; works for [employer].     FAMILY HISTORY: Family History  Problem Relation Age of Onset  . Diabetes Mother   . Heart disease Mother   . Mental illness Mother   . Thyroid disease Mother   . Heart disease Father   . Diabetes Father   . Mental illness Father     ALLERGIES:  has No Known Allergies.  MEDICATIONS:  Current Outpatient Medications  Medication Sig Dispense Refill  . acetaminophen (TYLENOL) 325 MG tablet Take by mouth every 6 (six) hours as needed for headache.    Marland Kitchen amLODipine-olmesartan (AZOR) 5-40 MG tablet Take 1 tablet by mouth daily. 90 tablet 0  . aspirin EC 81 MG tablet Take 81 mg by mouth daily.    Marland Kitchen atorvastatin (LIPITOR) 40 MG tablet Take 1 tablet (40 mg total) by mouth daily. 30 tablet 2  . carvedilol (COREG) 25 MG tablet TAKE 1 TABLET TWICE A DAY WITH A MEAL (Patient taking differently: Take 25 mg by mouth 2 (two) times daily with a meal. TAKE 1 TABLET TWICE A DAY WITH A MEAL) 180 tablet 0  . cetirizine (ZYRTEC) 10 MG tablet Take 10 mg by mouth daily.    . chlorthalidone (HYGROTON) 25 MG tablet Take 0.5 tablets (12.5 mg total) by mouth daily. 45 tablet 0  . docusate sodium (COLACE) 100 MG capsule Take 1 capsule (100 mg total) by mouth 2 (two) times daily. 10 capsule 0  . fluticasone (FLONASE) 50 MCG/ACT nasal spray Place 2 sprays into both nostrils daily.    Marland Kitchen  levothyroxine (SYNTHROID) 100 MCG tablet Take 1 tablet (100 mcg total) by mouth daily. 90 tablet 3  . metFORMIN (GLUCOPHAGE) 500 MG tablet Take 1 tablet (500 mg total) by mouth 2 (two) times daily with a meal. 180 tablet 0  . potassium chloride (K-DUR) 10 MEQ tablet Take 1 tablet (10 mEq total) by mouth daily. 90 tablet 0  . pseudoephedrine (SUDAFED) 30 MG tablet Take 30 mg by mouth every 4 (four) hours as needed for congestion.     No current facility-administered medications for this visit.       Marland Kitchen  PHYSICAL EXAMINATION: ECOG PERFORMANCE STATUS: 0 - Asymptomatic  Vitals:   02/26/19 1033  BP: 120/67  Pulse: 78  Resp: 18  Temp: 98.1 F (36.7 C)   Filed Weights  02/26/19 1033  Weight: 237 lb (107.5 kg)    Physical Exam  Constitutional: He is oriented to person, place, and time and well-developed, well-nourished, and in no distress.  HENT:  Head: Normocephalic and atraumatic.  Mouth/Throat: Oropharynx is clear and moist. No oropharyngeal exudate.  Eyes: Pupils are equal, round, and reactive to light.  Neck: Normal range of motion. Neck supple.  Cardiovascular: Normal rate and regular rhythm.  Pulmonary/Chest: Effort normal and breath sounds normal. No respiratory distress. He has no wheezes.  Abdominal: Soft. Bowel sounds are normal. He exhibits no distension and no mass. There is no abdominal tenderness. There is no rebound and no guarding.  Abdominal incisions well healing.  Musculoskeletal: Normal range of motion.        General: No tenderness or edema.  Neurological: He is alert and oriented to person, place, and time.  Skin: Skin is warm.  Psychiatric: Affect normal.     LABORATORY DATA:  I have reviewed the data as listed Lab Results  Component Value Date   WBC 7.1 02/26/2019   HGB 8.3 (L) 02/26/2019   HCT 26.7 (L) 02/26/2019   MCV 81.4 02/26/2019   PLT 316 02/26/2019   Recent Labs    03/13/18 0823 02/04/19 1021  02/08/19 1449 02/09/19 0448  02/26/19 1130  NA 137 138   < > 134* 136 136  K 4.1 3.6   < > 3.3* 3.3* 3.4*  CL 101 103   < > 102 101 98  CO2 25 26   < > 26 26 27   GLUCOSE 114* 129*   < > 151* 126* 162*  BUN 19 17   < > 17 17 22*  CREATININE 0.91 0.83   < > 1.13 1.25* 1.37*  CALCIUM 9.7 9.3   < > 8.0* 8.3* 9.2  GFRNONAA 100 >60   < > >60 >60 >60  GFRAA 116 >60   < > >60 >60 >60  PROT 7.9 8.0  --   --   --  7.8  ALBUMIN  --  4.1  --   --   --  3.8  AST 21 26  --   --   --  23  ALT 25 31  --   --   --  23  ALKPHOS  --  59  --   --   --  79  BILITOT 0.4 0.7  --   --   --  0.5   < > = values in this interval not displayed.    RADIOGRAPHIC STUDIES: I have personally reviewed the radiological images as listed and agreed with the findings in the report. Ct Abdomen Pelvis W Wo Contrast  Result Date: 02/05/2019 CLINICAL DATA:  Gross hematuria with clots. EXAM: CT ABDOMEN AND PELVIS WITHOUT AND WITH CONTRAST TECHNIQUE: Multidetector CT imaging of the abdomen and pelvis was performed following the standard protocol before and following the bolus administration of intravenous contrast. CONTRAST:  167mL OMNIPAQUE IOHEXOL 300 MG/ML  SOLN COMPARISON:  None. FINDINGS: Lower chest: 4 mm subpleural right middle lobe nodule, image 1 series 4. Coronary artery calcifications. Hepatobiliary: Hepatic steatosis with focal fatty sparing adjacent the gallbladder fossa. No focal hepatic lesion. Vague gallbladder hyperdensity may be sludge or noncalcified stones. No pericholecystic inflammation. No biliary dilatation. Pancreas: No ductal dilatation or inflammation. Spleen: Normal in size without focal abnormality. Adrenals/Urinary Tract: Normal adrenal glands. Large heterogeneous enhancing mass arises from the mid anterior left kidney measuring 8.6 x 8.1 x 9.4 cm. This  causes mass effect on the right renal collecting system without frank hydronephrosis. Mass abuts and compresses the renal vein at the hilum but no evidence of filling defect or venous  invasion. There is bulging of left renal capsule. Mild left ureteral thickening or periureteric edema distally. No stones in either kidney. Right kidney is unremarkable. No right hydronephrosis, perinephric edema, or focal lesion. Foley catheter within the urinary bladder. No excreted contrast in the urinary bladder, which is decompressed by Foley catheter limiting assessment. There is lobulated internal hyperdensity, large blood clots versus bladder mass, not well-defined. Mild perivesicular edema anteriorly. Stomach/Bowel: Stomach is nondistended. No bowel wall thickening, inflammation, or obstruction. Appendix possibly but not definitively identified and normal. Moderate stool throughout the colon. No colonic wall thickening or inflammation. No significant diverticular disease. Mild sigmoid colonic tortuosity. Vascular/Lymphatic: Prominent portal caval node measuring 11 mm short axis, image 46 series 13. No enlarged retroperitoneal lymph nodes. Small bilateral external iliac nodes measure 5 mm and are nonspecific. There is an 8 mm right anterior pararenal node, image 77 series 13. No left perirenal adenopathy. Multiple small mesenteric nodes, not enlarged by size criteria. Mild aortic atherosclerosis without aneurysm. Reproductive: Prostate is unremarkable. Other: No free air or free fluid. Tiny fat containing umbilical hernia. Musculoskeletal: Subcentimeter sclerotic focus in the right proximal femur is nonspecific but likely bone island. No lytic lesions. No acute osseous abnormalities. IMPRESSION: 1. Large heterogeneous enhancing mass arising from the mid anterior left kidney measuring 8.6 x 8.1 x 9.4 cm, highly suspicious for renal cell carcinoma. Mass abuts and compresses the left renal vein without evidence of filling defect or venous invasion. 2. Lobulated hyperdensity within the urinary bladder which is decompressed by Foley catheter. This may represent blood clots versus less likely lobulated bladder  mass. Recommend correlation with cystoscopy. 3. Hepatic steatosis. Prominent portal caval and right anterior pararenal lymph nodes, are likely reactive but nonspecific. 4. Subpleural 4 mm pulmonary nodule in the right middle lobe. 5. Aortic Atherosclerosis (ICD10-I70.0). Coronary artery calcifications. Electronically Signed   By: Keith Rake M.D.   On: 02/05/2019 19:30   Ct Chest W Contrast  Result Date: 02/05/2019 CLINICAL DATA:  Recently diagnosed renal cell carcinoma. Acute onset of gross hematuria and urinary retention. Evaluate for lung metastases. EXAM: CT CHEST WITH CONTRAST TECHNIQUE: Multidetector CT imaging of the chest was performed during intravenous contrast administration. CONTRAST:  76mL OMNIPAQUE IOHEXOL 300 MG/ML  SOLN COMPARISON:  Chest radiograph performed 09/22/2012, and CT of the abdomen and pelvis performed earlier today at 6:57 p.m. FINDINGS: Cardiovascular: The heart is normal in size. Minimal calcification is noted at the aortic arch. The great vessels are unremarkable in appearance. Mediastinum/Nodes: The mediastinum is grossly unremarkable in appearance. No mediastinal lymphadenopathy is seen. No pericardial effusion is identified. A small peripherally calcified hypodensity at the right thyroid lobe is likely benign, given its size. No axillary lymphadenopathy is seen. Lungs/Pleura: The lungs are clear bilaterally. No focal consolidation, pleural effusion or pneumothorax is seen. No masses are identified. There is no evidence for metastatic disease. Upper Abdomen: The visualized portions of the liver and spleen are unremarkable. The visualized portions of the gallbladder and pancreas are within normal limits. Musculoskeletal: No acute osseous abnormalities are identified. The visualized musculature is unremarkable in appearance. IMPRESSION: No evidence of metastatic disease within the chest. Unremarkable contrast-enhanced CT of the chest. Electronically Signed   By: Garald Balding  M.D.   On: 02/05/2019 21:40    ASSESSMENT & PLAN:   Cancer  of left kidney ([place]) #Kidney cancer-clear cell carcinoma stage III. PT3N0.  Fuhrman grade 2.  Status post radical nephrectomy  #Discussed with the patient the pathology/stage of cancer.  Reviewed the controversial benefit of adjuvant therapy with sunitinib.  Discussed that as per current data-1 study has shown improvement of disease-free survival-experimental versus placebo-6.8 versus 5.6 years.  Among the high risk [stage III; Fuhrman grade 2]-the median DFS-6 versus 4 years.  Unfortunately there is no survival data at this time given the short follow-up.  # I discussed the role of immunotherapy-in adjuvant setting; however this is still being studied in clinical trials.  Unfortunately would not have any clinical trials at our institution.  Discussed referral to tertiary institutions.  Patient declines.  #I discussed the potential side effects of tyrosine kinase inhibitor-including but not limited to elevated blood pressure; fatigue; hypothyroidism; proteinuria etc.  # Anemia-hemoglobin 7 secondary to hematuria.  Repeat CBC iron studies.  Recommend p.o. iron.  # HTN-currently stable.  #After lengthy discussion-patient states that he will weigh all his options; and decide whether to proceed with the adjuvant therapy at the next visit.  Which I think is very reasonable.  Thank you Dr.Sninski for allowing me to participate in the care of your pleasant patient. Please do not hesitate to contact me with questions or concerns in the interim.  # DISPOSITION: # labs- cbc/cmp/ldh # follow up in 3 weeks- MD/no labs- Dr.B  All questions were answered. The patient knows to call the clinic with any problems, questions or concerns.    Cammie Sickle, MD 03/02/2019 7:54 AM

## 2019-02-26 NOTE — Assessment & Plan Note (Addendum)
#  Kidney cancer-clear cell carcinoma stage III. PT3N0.  Fuhrman grade 2.  Status post radical nephrectomy  #Discussed with the patient the pathology/stage of cancer.  Reviewed the controversial benefit of adjuvant therapy with sunitinib.  Discussed that as per current data-1 study has shown improvement of disease-free survival-experimental versus placebo-6.8 versus 5.6 years.  Among the high risk [stage III; Fuhrman grade 2]-the median DFS-6 versus 4 years.  Unfortunately there is no survival data at this time given the short follow-up.  # I discussed the role of immunotherapy-in adjuvant setting; however this is still being studied in clinical trials.  Unfortunately would not have any clinical trials at our institution.  Discussed referral to tertiary institutions.  Patient declines.  #I discussed the potential side effects of tyrosine kinase inhibitor-including but not limited to elevated blood pressure; fatigue; hypothyroidism; proteinuria etc.  # Anemia-hemoglobin 7 secondary to hematuria.  Repeat CBC iron studies.  Recommend p.o. iron.  # HTN-currently stable.  #After lengthy discussion-patient states that he will weigh all his options; and decide whether to proceed with the adjuvant therapy at the next visit.  Which I think is very reasonable.  Thank you Dr.Sninski for allowing me to participate in the care of your pleasant patient. Please do not hesitate to contact me with questions or concerns in the interim.  # DISPOSITION: # labs- cbc/cmp/ldh # follow up in 3 weeks- MD/no labs- Dr.B

## 2019-03-05 ENCOUNTER — Telehealth: Payer: Self-pay | Admitting: Pharmacy Technician

## 2019-03-05 NOTE — Telephone Encounter (Signed)
Patient has [employer] with prescription drug coverage.  No longer meets the eligibility criteria for [facility].  [facility]

## 2019-03-12 ENCOUNTER — Telehealth: Payer: Self-pay | Admitting: Pharmacy Technician

## 2019-03-12 NOTE — Telephone Encounter (Signed)
Patient provided documentation that prescription drug coverage ended.  Eligible to receive [facility] services.  [facility]

## 2019-03-19 ENCOUNTER — Encounter: Payer: Self-pay | Admitting: Internal Medicine

## 2019-03-19 ENCOUNTER — Other Ambulatory Visit: Payer: Self-pay

## 2019-03-19 ENCOUNTER — Ambulatory Visit: Payer: Self-pay | Admitting: Internal Medicine

## 2019-03-19 NOTE — Progress Notes (Signed)
Patient stated that he had been doing well with no complaints. 

## 2019-03-20 ENCOUNTER — Telehealth: Payer: Self-pay | Admitting: Pharmacist

## 2019-03-20 ENCOUNTER — Telehealth: Payer: Self-pay | Admitting: Internal Medicine

## 2019-03-20 ENCOUNTER — Other Ambulatory Visit: Payer: Self-pay

## 2019-03-20 ENCOUNTER — Inpatient Hospital Stay: Payer: Self-pay | Attending: Internal Medicine | Admitting: Internal Medicine

## 2019-03-20 DIAGNOSIS — Z7984 Long term (current) use of oral hypoglycemic drugs: Secondary | ICD-10-CM | POA: Insufficient documentation

## 2019-03-20 DIAGNOSIS — R5383 Other fatigue: Secondary | ICD-10-CM | POA: Insufficient documentation

## 2019-03-20 DIAGNOSIS — Z905 Acquired absence of kidney: Secondary | ICD-10-CM | POA: Insufficient documentation

## 2019-03-20 DIAGNOSIS — R5381 Other malaise: Secondary | ICD-10-CM | POA: Insufficient documentation

## 2019-03-20 DIAGNOSIS — Z7982 Long term (current) use of aspirin: Secondary | ICD-10-CM | POA: Insufficient documentation

## 2019-03-20 DIAGNOSIS — I10 Essential (primary) hypertension: Secondary | ICD-10-CM | POA: Insufficient documentation

## 2019-03-20 DIAGNOSIS — Z79899 Other long term (current) drug therapy: Secondary | ICD-10-CM | POA: Insufficient documentation

## 2019-03-20 DIAGNOSIS — D509 Iron deficiency anemia, unspecified: Secondary | ICD-10-CM | POA: Insufficient documentation

## 2019-03-20 DIAGNOSIS — C642 Malignant neoplasm of left kidney, except renal pelvis: Secondary | ICD-10-CM

## 2019-03-20 DIAGNOSIS — E119 Type 2 diabetes mellitus without complications: Secondary | ICD-10-CM | POA: Insufficient documentation

## 2019-03-20 MED ORDER — SUNITINIB MALATE 50 MG PO CAPS: 50.0000 mg | ORAL_CAPSULE | Freq: Every day | ORAL | 6 refills | Status: DC

## 2019-03-20 NOTE — Telephone Encounter (Signed)
Oral Oncology Pharmacist Encounter  Received new prescription for Sutent (sunitinib) for the adjuvant treatment of stage III clear cell renal cell carcinoma, planned duration nine 6 week cycles.  CBC/CMP from 02/26/2019 assessed, no relevant lab abnormalities. Prescription dose and frequency assessed.   Current medication list in Epic reviewed, one relevant DDIs with sunitinib identified: -Antidiabetic medications like metformin may enhance the hypoglycemic effect of sunitinib. Monitor patient for hypoglycemic effects.  Patient does not have prescription insurance so we will apply for manufacturer assistance.  Oral Oncology Clinic will continue to follow for initial counseling and start date.  Darl Pikes, PharmD, BCPS, [facility] Hematology/Oncology Clinical Pharmacist [facility] [phone]  03/20/2019 4:13 PM

## 2019-03-20 NOTE — Assessment & Plan Note (Addendum)
#  Kidney cancer-clear cell carcinoma stage III. PT3N0.  Fuhrman grade 2 [high risk].  Status post radical nephrectomy.  #Reviewed the data on adjuvant therapy with Sutent.  No overall survival data-median disease-free survival [for high risk stage III-Fuhrman grade 2]-6 years versus 4 years.  Patient can get started on Sutent when available.  #Patient is interested in therapy.  Recommend Sutent 50 mg 2 weeks on 1 week off-given the concerns of side effects.  Again reviewed the potential side effects including but not limited to elevated blood pressure/wound healing problems/proteinuria etc.  # Anemia hemoglobin 8.3 improving continue recommend p.o. iron.  # HTN-stable.  Recommend checking blood pressure at home once started on Sutent.  # DISPOSITION: # Follow up TBD- Dr.B  Addendum discussed with Ebony Hail.  We will have the patient follow-up with me in approximately 5 weeks/CBC/CMP/UA.

## 2019-03-20 NOTE — Telephone Encounter (Signed)
Labs added.

## 2019-03-20 NOTE — Telephone Encounter (Signed)
Please have the patient follow-up with me in approximately 5 weeks; MD labs-CBC CMP/UA. Thanks GB

## 2019-03-20 NOTE — Addendum Note (Signed)
Addended by: Sabino Gasser on: 03/20/2019 04:18 PM   Modules accepted: Orders

## 2019-03-20 NOTE — Progress Notes (Signed)
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 Sniskinski]; July CT 2020- mid anterior left kidney measuring 8.6 x 8.1 x 9.4 cm; CT chest negative   # DM- 2- OHA/ HTN  DIAGNOSIS: Left kidney cancer  STAGE: 3       ;GOALS: Cure  CURRENT/MOST RECENT THERAPY: Pending    Cancer of left kidney ([place])  02/26/2019 Initial Diagnosis   Cancer of left kidney ([facility])      HISTORY OF PRESENTING ILLNESS:  Charles Mathis 48 y.o.  male history of stage III clear cell carcinoma is here for follow-up.  Patient is currently n.p.o. and given his anemia/iron deficiency.  Patient denies any constipation but denies any blood in stools or black-colored stools.  His appetite is improving.  Energy levels are improving but not back to baseline.  Recently his thyroid medication was changed/increased.  Review of Systems  Constitutional: Positive for malaise/fatigue. Negative for chills, diaphoresis, fever and weight loss.  HENT: Negative for nosebleeds and sore throat.   Eyes: Negative for double vision.  Respiratory: Negative for cough, hemoptysis, sputum production, shortness of breath and wheezing.   Cardiovascular: Negative for chest pain, palpitations, orthopnea and leg swelling.  Gastrointestinal: Negative for abdominal pain, blood in stool, constipation, diarrhea, heartburn, melena, nausea and vomiting.  Genitourinary: Negative for dysuria, frequency and urgency.  Musculoskeletal: Negative for back pain and joint pain.  Skin: Negative.  Negative for itching and rash.  Neurological: Negative for dizziness, tingling, focal weakness, weakness and headaches.  Endo/Heme/Allergies: Does not bruise/bleed easily.   Psychiatric/Behavioral: Negative for depression. The patient is not nervous/anxious and does not have insomnia.      MEDICAL HISTORY:  Past Medical History:  Diagnosis Date  . Allergy   . Diabetes mellitus without complication ([place])   . Hypertension     SURGICAL HISTORY: Past Surgical History:  Procedure Laterality Date  . CYSTOSCOPY  02/06/2019   Procedure: CYSTOSCOPY;  Surgeon: Billey Co, MD;  Location: [facility] ORS;  Service: Urology;;  . LAPAROSCOPIC NEPHRECTOMY, HAND ASSISTED Left 02/06/2019   Procedure: HAND ASSISTED LAPAROSCOPIC LEFT RADICAL NEPHRECTOMY;  Surgeon: Billey Co, MD;  Location: [facility] ORS;  Service: Urology;  Laterality: Left;  . NO PAST SURGERIES      SOCIAL HISTORY: Social History   Socioeconomic History  . Marital status: Married    Spouse name: Not on file  . Number of children: Not on file  . Years of education: Not on file  . Highest education level: Not on file  Occupational History  . Not on file  Social Needs  . Financial resource strain: Not on file  . Food insecurity    Worry: Not on file    Inability: Not on file  . Transportation needs    Medical: Not on file    Non-medical: Not on file  Tobacco Use  . Smoking status: Never Smoker  . Smokeless tobacco: Never Used  Substance and Sexual Activity  . Alcohol use: No  . Drug use: No  . Sexual activity: Yes    Birth control/protection: Other-see comments    Comment: mutually monogamous relationship partner postmenopausal  Lifestyle  . Physical activity    Days per week: Not on file    Minutes  per session: Not on file  . Stress: Not on file  Relationships  . Social Herbalist on phone: Not on file    Gets together: Not on file    Attends religious service: Not on file    Active member of club or organization: Not on file    Attends meetings of clubs or organizations: Not on file    Relationship status: Not on file  . Intimate partner violence    Fear of current  or ex partner: Not on file    Emotionally abused: Not on file    Physically abused: Not on file    Forced sexual activity: Not on file  Other Topics Concern  . Not on file  Social History Narrative   In [place]/ with wife; son Cherlyn Cushing syndrome]; no smoking/ no alcohol; works for [employer], [employer].     FAMILY HISTORY: Family History  Problem Relation Age of Onset  . Diabetes Mother   . Heart disease Mother   . Mental illness Mother   . Thyroid disease Mother   . Heart disease Father   . Diabetes Father   . Mental illness Father     ALLERGIES:  has No Known Allergies.  MEDICATIONS:  Current Outpatient Medications  Medication Sig Dispense Refill  . acetaminophen (TYLENOL) 325 MG tablet Take by mouth every 6 (six) hours as needed for headache.    Marland Kitchen amLODipine-olmesartan (AZOR) 5-40 MG tablet Take 1 tablet by mouth daily. 90 tablet 0  . aspirin EC 81 MG tablet Take 81 mg by mouth daily.    Marland Kitchen atorvastatin (LIPITOR) 40 MG tablet Take 1 tablet (40 mg total) by mouth daily. 30 tablet 2  . carvedilol (COREG) 25 MG tablet TAKE 1 TABLET TWICE A DAY WITH A MEAL (Patient taking differently: Take 25 mg by mouth 2 (two) times daily with a meal. TAKE 1 TABLET TWICE A DAY WITH A MEAL) 180 tablet 0  . cetirizine (ZYRTEC) 10 MG tablet Take 10 mg by mouth daily.    . chlorthalidone (HYGROTON) 25 MG tablet Take 0.5 tablets (12.5 mg total) by mouth daily. 45 tablet 0  . docusate sodium (COLACE) 100 MG capsule Take 1 capsule (100 mg total) by mouth 2 (two) times daily. 10 capsule 0  . fluticasone (FLONASE) 50 MCG/ACT nasal spray Place 2 sprays into both nostrils daily.    Marland Kitchen levothyroxine (SYNTHROID) 100 MCG tablet Take 1 tablet (100 mcg total) by mouth daily. 90 tablet 3  . metFORMIN (GLUCOPHAGE) 500 MG tablet Take 1 tablet (500 mg total) by mouth 2 (two) times daily with a meal. 180 tablet 0  . potassium chloride (K-DUR) 10 MEQ tablet Take 1 tablet (10 mEq total) by mouth daily. 90 tablet 0  .  pseudoephedrine (SUDAFED) 30 MG tablet Take 30 mg by mouth every 4 (four) hours as needed for congestion.    . SUNItinib (SUTENT) 50 MG capsule Take 1 capsule (50 mg total) by mouth daily. One pill a day; 2 weeks-On and 1 week-OFF 14 capsule 6   No current facility-administered medications for this visit.       Marland Kitchen  PHYSICAL EXAMINATION: ECOG PERFORMANCE STATUS: 0 - Asymptomatic  Vitals:   03/20/19 1345  BP: 140/78  Pulse: 78  Resp: 18  Temp: 99.2 F (37.3 C)   Filed Weights   03/20/19 1345  Weight: 246 lb (111.6 kg)    Physical Exam  Constitutional: He is oriented to person, place, and time and  well-developed, well-nourished, and in no distress.  HENT:  Head: Normocephalic and atraumatic.  Mouth/Throat: Oropharynx is clear and moist. No oropharyngeal exudate.  Eyes: Pupils are equal, round, and reactive to light.  Neck: Normal range of motion. Neck supple.  Cardiovascular: Normal rate and regular rhythm.  Pulmonary/Chest: Effort normal and breath sounds normal. No respiratory distress. He has no wheezes.  Abdominal: Soft. Bowel sounds are normal. He exhibits no distension and no mass. There is no abdominal tenderness. There is no rebound and no guarding.  Abdominal incisions well healing.  Musculoskeletal: Normal range of motion.        General: No tenderness or edema.  Neurological: He is alert and oriented to person, place, and time.  Skin: Skin is warm.  Psychiatric: Affect normal.     LABORATORY DATA:  I have reviewed the data as listed Lab Results  Component Value Date   WBC 7.1 02/26/2019   HGB 8.3 (L) 02/26/2019   HCT 26.7 (L) 02/26/2019   MCV 81.4 02/26/2019   PLT 316 02/26/2019   Recent Labs    02/04/19 1021  02/08/19 1449 02/09/19 0448 02/26/19 1130  NA 138   < > 134* 136 136  K 3.6   < > 3.3* 3.3* 3.4*  CL 103   < > 102 101 98  CO2 26   < > 26 26 27   GLUCOSE 129*   < > 151* 126* 162*  BUN 17   < > 17 17 22*  CREATININE 0.83   < > 1.13 1.25*  1.37*  CALCIUM 9.3   < > 8.0* 8.3* 9.2  GFRNONAA >60   < > >60 >60 >60  GFRAA >60   < > >60 >60 >60  PROT 8.0  --   --   --  7.8  ALBUMIN 4.1  --   --   --  3.8  AST 26  --   --   --  23  ALT 31  --   --   --  23  ALKPHOS 59  --   --   --  79  BILITOT 0.7  --   --   --  0.5   < > = values in this interval not displayed.    RADIOGRAPHIC STUDIES: I have personally reviewed the radiological images as listed and agreed with the findings in the report. No results found.  ASSESSMENT & PLAN:   Cancer of left kidney ([place]) #Kidney cancer-clear cell carcinoma stage III. PT3N0.  Fuhrman grade 2 [high risk].  Status post radical nephrectomy.  #Reviewed the data on adjuvant therapy with Sutent.  No overall survival data-median disease-free survival [for high risk stage III-Fuhrman grade 2]-6 years versus 4 years.  Patient can get started on Sutent when available.  #Patient is interested in therapy.  Recommend Sutent 50 mg 2 weeks on 1 week off-given the concerns of side effects.  Again reviewed the potential side effects including but not limited to elevated blood pressure/wound healing problems/proteinuria etc.  # Anemia hemoglobin 8.3 improving continue recommend p.o. iron.  # HTN-stable.  Recommend checking blood pressure at home once started on Sutent.  # DISPOSITION: # Follow up TBD- Dr.B  Addendum discussed with Ebony Hail.  We will have the patient follow-up with me in approximately 5 weeks/CBC/CMP/UA.   All questions were answered. The patient knows to call the clinic with any problems, questions or concerns.    Cammie Sickle, MD 03/20/2019 4:05 PM

## 2019-03-24 ENCOUNTER — Telehealth: Payer: Self-pay | Admitting: Pharmacy Technician

## 2019-03-24 NOTE — Telephone Encounter (Signed)
Oral Oncology Patient Advocate Encounter  Spoke with patient today about applying for assistance.  He is familiar with the process and will bring income documents to the office on Friday and sign the application.  [facility] Patient [place] Phone [phone] Fax [phone] 03/24/2019 1:01 PM

## 2019-03-30 NOTE — Telephone Encounter (Signed)
Oral Oncology Patient Advocate Encounter  Met patient in [facility] lobby on 7/97 to complete application for [facility] in an effort to reduce patient's out of pocket expense for Sutent to $0.    Application completed and faxed to [phone] on 03/27/2019.   [facility] patient assistance phone number for follow up is [phone].   This encounter will be updated until final determination.   Charles Mathis Phone [phone] Fax [phone] 03/30/2019 8:22 AM

## 2019-04-01 NOTE — Telephone Encounter (Signed)
Oral Oncology Patient Advocate Encounter  Received notification from [name] that patient has been successfully enrolled into their program to receive Sutent from the manufacturer at $0 out of pocket until 03/31/2020.    Patient has already spoke with [name] and knows of approval.  He has scheduled delivery of Sutent for 04/03/2019.  Patient knows to call the office with questions or concerns.   Oral Oncology Clinic will continue to follow.  Charles Mathis Phone [phone] Fax [phone] 04/01/2019 9:50 AM

## 2019-04-24 ENCOUNTER — Inpatient Hospital Stay (HOSPITAL_BASED_OUTPATIENT_CLINIC_OR_DEPARTMENT_OTHER): Payer: Self-pay | Admitting: Internal Medicine

## 2019-04-24 ENCOUNTER — Other Ambulatory Visit: Payer: Self-pay

## 2019-04-24 ENCOUNTER — Encounter: Payer: Self-pay | Admitting: Internal Medicine

## 2019-04-24 ENCOUNTER — Inpatient Hospital Stay: Payer: Self-pay | Attending: Internal Medicine

## 2019-04-24 DIAGNOSIS — D509 Iron deficiency anemia, unspecified: Secondary | ICD-10-CM | POA: Insufficient documentation

## 2019-04-24 DIAGNOSIS — D649 Anemia, unspecified: Secondary | ICD-10-CM | POA: Insufficient documentation

## 2019-04-24 DIAGNOSIS — R5381 Other malaise: Secondary | ICD-10-CM | POA: Insufficient documentation

## 2019-04-24 DIAGNOSIS — Z7984 Long term (current) use of oral hypoglycemic drugs: Secondary | ICD-10-CM | POA: Insufficient documentation

## 2019-04-24 DIAGNOSIS — I10 Essential (primary) hypertension: Secondary | ICD-10-CM | POA: Insufficient documentation

## 2019-04-24 DIAGNOSIS — D696 Thrombocytopenia, unspecified: Secondary | ICD-10-CM | POA: Insufficient documentation

## 2019-04-24 DIAGNOSIS — C642 Malignant neoplasm of left kidney, except renal pelvis: Secondary | ICD-10-CM | POA: Insufficient documentation

## 2019-04-24 DIAGNOSIS — E119 Type 2 diabetes mellitus without complications: Secondary | ICD-10-CM | POA: Insufficient documentation

## 2019-04-24 DIAGNOSIS — Z905 Acquired absence of kidney: Secondary | ICD-10-CM | POA: Insufficient documentation

## 2019-04-24 DIAGNOSIS — Z7982 Long term (current) use of aspirin: Secondary | ICD-10-CM | POA: Insufficient documentation

## 2019-04-24 DIAGNOSIS — D72819 Decreased white blood cell count, unspecified: Secondary | ICD-10-CM | POA: Insufficient documentation

## 2019-04-24 DIAGNOSIS — R630 Anorexia: Secondary | ICD-10-CM | POA: Insufficient documentation

## 2019-04-24 DIAGNOSIS — R5383 Other fatigue: Secondary | ICD-10-CM | POA: Insufficient documentation

## 2019-04-24 DIAGNOSIS — R110 Nausea: Secondary | ICD-10-CM | POA: Insufficient documentation

## 2019-04-24 DIAGNOSIS — Z79899 Other long term (current) drug therapy: Secondary | ICD-10-CM | POA: Insufficient documentation

## 2019-04-24 LAB — COMPREHENSIVE METABOLIC PANEL
ALT: 25 U/L (ref 0–44)
AST: 27 U/L (ref 15–41)
Albumin: 3.7 g/dL (ref 3.5–5.0)
Alkaline Phosphatase: 85 U/L (ref 38–126)
Anion gap: 10 (ref 5–15)
BUN: 21 mg/dL — ABNORMAL HIGH (ref 6–20)
CO2: 28 mmol/L (ref 22–32)
Calcium: 8.9 mg/dL (ref 8.9–10.3)
Chloride: 100 mmol/L (ref 98–111)
Creatinine, Ser: 1.28 mg/dL — ABNORMAL HIGH (ref 0.61–1.24)
GFR calc Af Amer: >60 mL/min (ref >60)
GFR calc non Af Amer: >60 mL/min (ref >60)
Glucose, Bld: 107 mg/dL — ABNORMAL HIGH (ref 70–99)
Potassium: 3.1 mmol/L — ABNORMAL LOW (ref 3.5–5.1)
Sodium: 138 mmol/L (ref 135–145)
Total Bilirubin: 0.8 mg/dL (ref 0.3–1.2)
Total Protein: 7.7 g/dL (ref 6.5–8.1)

## 2019-04-24 LAB — CBC WITH DIFFERENTIAL/PLATELET
Abs Immature Granulocytes: 0 10*3/uL (ref 0.00–0.07)
Basophils Absolute: 0 10*3/uL (ref 0.0–0.1)
Basophils Relative: 1 %
Eosinophils Absolute: 0.2 10*3/uL (ref 0.0–0.5)
Eosinophils Relative: 6 %
HCT: 36.2 % — ABNORMAL LOW (ref 39.0–52.0)
Hemoglobin: 11.1 g/dL — ABNORMAL LOW (ref 13.0–17.0)
Immature Granulocytes: 0 %
Lymphocytes Relative: 33 %
Lymphs Abs: 0.9 10*3/uL (ref 0.7–4.0)
MCH: 24 pg — ABNORMAL LOW (ref 26.0–34.0)
MCHC: 30.7 g/dL (ref 30.0–36.0)
MCV: 78.4 fL — ABNORMAL LOW (ref 80.0–100.0)
Monocytes Absolute: 0.3 10*3/uL (ref 0.1–1.0)
Monocytes Relative: 11 %
Neutro Abs: 1.5 10*3/uL — ABNORMAL LOW (ref 1.7–7.7)
Neutrophils Relative %: 49 %
Platelets: 121 10*3/uL — ABNORMAL LOW (ref 150–400)
RBC: 4.62 MIL/uL (ref 4.22–5.81)
RDW: 16.4 % — ABNORMAL HIGH (ref 11.5–15.5)
WBC: 2.9 10*3/uL — ABNORMAL LOW (ref 4.0–10.5)
nRBC: 0 % (ref 0.0–0.2)

## 2019-04-24 NOTE — Progress Notes (Signed)
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 Sniskinski]; July CT 2020- mid anterior left kidney measuring 8.6 x 8.1 x 9.4 cm; CT chest negative  #April 06, 2019-Sutent 50 mg 2 weeks on 1 week off.   # DM- 2- OHA/ HTN  DIAGNOSIS: Left kidney cancer  STAGE: 3       ;GOALS: Cure  CURRENT/MOST RECENT THERAPY: Sutent    Cancer of left kidney ([place])  02/26/2019 Initial Diagnosis   Cancer of left kidney ([place])      HISTORY OF PRESENTING ILLNESS:  Charles Mathis 48 y.o.  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  Patient is currently status post cycle #1 of Sutent.  Patient complains of significant fatigue/body aches while on Sutent.  Poor appetite.  Mild nausea.  No vomiting.  No diarrhea.  No rash.  Patient has been off Sutent for about 4 days now.  He feels his symptoms are improving.  Review of Systems  Constitutional: Positive for malaise/fatigue. Negative for chills, diaphoresis, fever and weight loss.  HENT: Negative for nosebleeds and sore throat.   Eyes: Negative for double vision.  Respiratory: Negative for cough, hemoptysis, sputum production, shortness of breath and wheezing.   Cardiovascular: Negative for chest pain, palpitations, orthopnea and leg swelling.  Gastrointestinal: Positive for nausea. Negative for abdominal pain, blood in stool, constipation, diarrhea, heartburn, melena and vomiting.  Genitourinary: Negative for dysuria, frequency and urgency.  Musculoskeletal: Positive for myalgias. Negative for back pain and joint pain.  Skin: Negative.  Negative for itching and rash.  Neurological: Negative for dizziness,  tingling, focal weakness, weakness and headaches.  Endo/Heme/Allergies: Does not bruise/bleed easily.  Psychiatric/Behavioral: Negative for depression. The patient is not nervous/anxious and does not have insomnia.      MEDICAL HISTORY:  Past Medical History:  Diagnosis Date  . Allergy   . Diabetes mellitus without complication ([place])   . Hypertension     SURGICAL HISTORY: Past Surgical History:  Procedure Laterality Date  . CYSTOSCOPY  02/06/2019   Procedure: CYSTOSCOPY;  Surgeon: Billey Co, MD;  Location: [facility] ORS;  Service: Urology;;  . LAPAROSCOPIC NEPHRECTOMY, HAND ASSISTED Left 02/06/2019   Procedure: HAND ASSISTED LAPAROSCOPIC LEFT RADICAL NEPHRECTOMY;  Surgeon: Billey Co, MD;  Location: [facility] ORS;  Service: Urology;  Laterality: Left;  . NO PAST SURGERIES      SOCIAL HISTORY: Social History   Socioeconomic History  . Marital status: Married    Spouse name: Not on file  . Number of children: Not on file  . Years of education: Not on file  . Highest education level: Not on file  Occupational History  . Not on file  Social Needs  . Financial resource strain: Not on file  . Food insecurity    Worry: Not on file    Inability: Not on file  . Transportation needs    Medical: Not on file    Non-medical: Not on file  Tobacco Use  . Smoking status: Never Smoker  . Smokeless tobacco: Never Used  Substance and Sexual Activity  . Alcohol use: No  . Drug use: No  . Sexual activity: Yes    Birth control/protection: Other-see comments  Comment: mutually monogamous relationship partner postmenopausal  Lifestyle  . Physical activity    Days per week: Not on file    Minutes per session: Not on file  . Stress: Not on file  Relationships  . Social Herbalist on phone: Not on file    Gets together: Not on file    Attends religious service: Not on file    Active member of club or organization: Not on file    Attends meetings of clubs or  organizations: Not on file    Relationship status: Not on file  . Intimate partner violence    Fear of current or ex partner: Not on file    Emotionally abused: Not on file    Physically abused: Not on file    Forced sexual activity: Not on file  Other Topics Concern  . Not on file  Social History Narrative   In [place]/ with wife; son Cherlyn Cushing syndrome]; no smoking/ no alcohol; works for [employer], [employer].     FAMILY HISTORY: Family History  Problem Relation Age of Onset  . Diabetes Mother   . Heart disease Mother   . Mental illness Mother   . Thyroid disease Mother   . Heart disease Father   . Diabetes Father   . Mental illness Father     ALLERGIES:  has No Known Allergies.  MEDICATIONS:  Current Outpatient Medications  Medication Sig Dispense Refill  . acetaminophen (TYLENOL) 325 MG tablet Take by mouth every 6 (six) hours as needed for headache.    Marland Kitchen amLODipine-olmesartan (AZOR) 5-40 MG tablet Take 1 tablet by mouth daily. 90 tablet 0  . aspirin EC 81 MG tablet Take 81 mg by mouth daily.    Marland Kitchen atorvastatin (LIPITOR) 40 MG tablet Take 1 tablet (40 mg total) by mouth daily. 30 tablet 2  . carvedilol (COREG) 25 MG tablet TAKE 1 TABLET TWICE A DAY WITH A MEAL (Patient taking differently: Take 25 mg by mouth 2 (two) times daily with a meal. TAKE 1 TABLET TWICE A DAY WITH A MEAL) 180 tablet 0  . cetirizine (ZYRTEC) 10 MG tablet Take 10 mg by mouth daily.    . chlorthalidone (HYGROTON) 25 MG tablet Take 0.5 tablets (12.5 mg total) by mouth daily. 45 tablet 0  . docusate sodium (COLACE) 100 MG capsule Take 1 capsule (100 mg total) by mouth 2 (two) times daily. 10 capsule 0  . fluticasone (FLONASE) 50 MCG/ACT nasal spray Place 2 sprays into both nostrils daily.    Marland Kitchen levothyroxine (SYNTHROID) 100 MCG tablet Take 1 tablet (100 mcg total) by mouth daily. 90 tablet 3  . metFORMIN (GLUCOPHAGE) 500 MG tablet Take 1 tablet (500 mg total) by mouth 2 (two) times daily with a meal. 180  tablet 0  . potassium chloride (K-DUR) 10 MEQ tablet Take 1 tablet (10 mEq total) by mouth daily. 90 tablet 0  . pseudoephedrine (SUDAFED) 30 MG tablet Take 30 mg by mouth every 4 (four) hours as needed for congestion.    . SUNItinib (SUTENT) 50 MG capsule Take 1 capsule (50 mg total) by mouth daily. One pill a day; 2 weeks-On and 1 week-OFF 14 capsule 6   No current facility-administered medications for this visit.       Marland Kitchen  PHYSICAL EXAMINATION: ECOG PERFORMANCE STATUS: 0 - Asymptomatic  Vitals:   04/24/19 1043 04/24/19 1044  BP: (!) 169/105 (!) 169/105  Pulse: 60 60  Resp: 16 16  Temp: (!) 97.2  F (36.2 C) (!) 97.2 F (36.2 C)   Filed Weights   04/24/19 1043 04/24/19 1044  Weight: 237 lb (107.5 kg) 237 lb (107.5 kg)    Physical Exam  Constitutional: He is oriented to person, place, and time and well-developed, well-nourished, and in no distress.  HENT:  Head: Normocephalic and atraumatic.  Mouth/Throat: Oropharynx is clear and moist. No oropharyngeal exudate.  Eyes: Pupils are equal, round, and reactive to light.  Neck: Normal range of motion. Neck supple.  Cardiovascular: Normal rate and regular rhythm.  Pulmonary/Chest: Effort normal and breath sounds normal. No respiratory distress. He has no wheezes.  Abdominal: Soft. Bowel sounds are normal. He exhibits no distension and no mass. There is no abdominal tenderness. There is no rebound and no guarding.  Abdominal incisions well healing.  Musculoskeletal: Normal range of motion.        General: No tenderness or edema.  Neurological: He is alert and oriented to person, place, and time.  Skin: Skin is warm.  Psychiatric: Affect normal.     LABORATORY DATA:  I have reviewed the data as listed Lab Results  Component Value Date   WBC 2.9 (L) 04/24/2019   HGB 11.1 (L) 04/24/2019   HCT 36.2 (L) 04/24/2019   MCV 78.4 (L) 04/24/2019   PLT 121 (L) 04/24/2019   Recent Labs    02/04/19 1021  02/09/19 0448  02/26/19 1130 04/24/19 0956  NA 138   < > 136 136 138  K 3.6   < > 3.3* 3.4* 3.1*  CL 103   < > 101 98 100  CO2 26   < > 26 27 28   GLUCOSE 129*   < > 126* 162* 107*  BUN 17   < > 17 22* 21*  CREATININE 0.83   < > 1.25* 1.37* 1.28*  CALCIUM 9.3   < > 8.3* 9.2 8.9  GFRNONAA >60   < > >60 >60 >60  GFRAA >60   < > >60 >60 >60  PROT 8.0  --   --  7.8 7.7  ALBUMIN 4.1  --   --  3.8 3.7  AST 26  --   --  23 27  ALT 31  --   --  23 25  ALKPHOS 59  --   --  79 85  BILITOT 0.7  --   --  0.5 0.8   < > = values in this interval not displayed.    RADIOGRAPHIC STUDIES: I have personally reviewed the radiological images as listed and agreed with the findings in the report. No results found.  ASSESSMENT & PLAN:   Cancer of left kidney ([place]) #Kidney cancer-clear cell carcinoma stage III. PT3N0.  Fuhrman grade 2 [high risk].  Status post radical nephrectomy. Adjuvant therapy with Sutent 50 mg 2 weeks on 1 week off. S/p cylce #1; supposed to start cycle #2 on 10/12.  #Recommend holding Sutent; given the elevated blood pressure/see below.  Tolerating Sutent with moderate to severe side effects/see below  #Fatigue grade 2-as expected of Sutent.  Monitor closely.  If worse would recommend dose reductions.  # HTN- Elevated diastolic 123456; poorly controlled-Discussed that poorly controlled blood pressures can cause a stroke/heart attacks and other cardiovascular problems. Check blood pressures at home frequently/compliance with antihypertensives.  Keep a log of blood pressures/and bring it to next virtual visit.  # Anemia hemoglobin 8.3 improving continue recommend p.o. iron.  #Mild leukopenia/thrombocytopenia-secondary Sutent asymptomatic.  Monitor for now.  # DISPOSITION: #  in 2 weeks- DOXIMITY; MD; no labs- DR.B   All questions were answered. The patient knows to call the clinic with any problems, questions or concerns.    Cammie Sickle, MD 04/24/2019 11:15 AM

## 2019-04-24 NOTE — Assessment & Plan Note (Addendum)
#  Kidney cancer-clear cell carcinoma stage III. PT3N0.  Fuhrman grade 2 [high risk].  Status post radical nephrectomy. Adjuvant therapy with Sutent 50 mg 2 weeks on 1 week off. S/p cylce #1; supposed to start cycle #2 on 10/12.  #Recommend holding Sutent; given the elevated blood pressure/see below.  Tolerating Sutent with moderate to severe side effects/see below  #Fatigue grade 2-as expected of Sutent.  Monitor closely.  If worse would recommend dose reductions.  # HTN- Elevated diastolic 123456; poorly controlled-Discussed that poorly controlled blood pressures can cause a stroke/heart attacks and other cardiovascular problems. Check blood pressures at home frequently/compliance with antihypertensives.  Keep a log of blood pressures/and bring it to next virtual visit.  # Anemia hemoglobin 8.3 improving continue recommend p.o. iron.  #Mild leukopenia/thrombocytopenia-secondary Sutent asymptomatic.  Monitor for now.  # DISPOSITION: # in 2 weeks- DOXIMITY; MD; no labs- DR.B

## 2019-04-24 NOTE — Patient Instructions (Signed)
#  Do not start Sutent next week  #Please take your blood pressure medications as recommended/check blood pressure on daily basis/keep a log.  Will review over the phone in 2 weeks.  Call us/PCP if the blood pressure is significantly elevated

## 2019-05-08 ENCOUNTER — Other Ambulatory Visit: Payer: Self-pay

## 2019-05-08 ENCOUNTER — Encounter: Payer: Self-pay | Admitting: Nurse Practitioner

## 2019-05-08 ENCOUNTER — Inpatient Hospital Stay (HOSPITAL_BASED_OUTPATIENT_CLINIC_OR_DEPARTMENT_OTHER): Payer: Self-pay | Admitting: Nurse Practitioner

## 2019-05-08 ENCOUNTER — Ambulatory Visit (INDEPENDENT_AMBULATORY_CARE_PROVIDER_SITE_OTHER): Payer: Self-pay | Admitting: Nurse Practitioner

## 2019-05-08 VITALS — BP 143/87 | HR 70 | Ht 67.0 in | Wt 238.6 lb

## 2019-05-08 DIAGNOSIS — E1165 Type 2 diabetes mellitus with hyperglycemia: Secondary | ICD-10-CM

## 2019-05-08 DIAGNOSIS — I10 Essential (primary) hypertension: Secondary | ICD-10-CM

## 2019-05-08 DIAGNOSIS — Z6838 Body mass index (BMI) 38.0-38.9, adult: Secondary | ICD-10-CM

## 2019-05-08 DIAGNOSIS — E66812 Obesity, class 2: Secondary | ICD-10-CM

## 2019-05-08 DIAGNOSIS — C642 Malignant neoplasm of left kidney, except renal pelvis: Secondary | ICD-10-CM

## 2019-05-08 LAB — POCT GLYCOSYLATED HEMOGLOBIN (HGB A1C): Hemoglobin A1C: 6 % — AB (ref 4.0–5.6)

## 2019-05-08 MED ORDER — AMLODIPINE BESYLATE 10 MG PO TABS: 10.0000 mg | ORAL_TABLET | Freq: Every day | ORAL | 1 refills | Status: DC

## 2019-05-08 MED ORDER — OLMESARTAN MEDOXOMIL 40 MG PO TABS: 40.0000 mg | ORAL_TABLET | Freq: Every day | ORAL | 1 refills | Status: DC

## 2019-05-08 NOTE — Progress Notes (Signed)
Subjective:    Patient ID: Charles Mathis, male    DOB: 29-Jul-1970, 48 y.o.   MRN: [id number]  Charles Mathis is a 48 y.o. male presenting on 05/08/2019 for Diabetes   HPI Hypertension - He is not checking BP at home or outside of clinic.  Readings regularly elevated at cancer center - Current medications: chlorthalidone 12.5 mg daily, carvedilol 25 mg bid, amlodipine 5 mg daily, olmesartan 40 mg daily, tolerating well without side effects - He is not currently symptomatic. - Pt denies headache, lightheadedness, dizziness, changes in vision, chest tightness/pressure, palpitations, leg swelling, sudden loss of speech or loss of consciousness. - He reports no regular exercise routine. - His diet is low in salt, moderate in fat, and low in carbohydrates.   Diabetes Pt presents today for follow up of Type 2 diabetes mellitus. He is checking fasting am CBG at home with a range of 87-160, usually110-115 - Current diabetic medications include: metformin - He is not currently symptomatic.  - He denies polydipsia, polyphagia, polyuria, headaches, diaphoresis, shakiness, chills, pain, numbness or tingling in extremities and changes in vision.   - Clinical course has been improving. - Weight trend: decreasing steadily.  Appetite decreased for a couple days due to taste changes with chemo.  Improved now.  PREVENTION: Eye exam current (within one year): no Foot exam current (within one year): yes Lipid/ASCVD risk reduction - on statin: yes Kidney protection - on ace or arb: olmesartan Recent Labs    11/07/18 1400 02/04/19 0929  HGBA1C 6.8* 6.5*   Social History   Tobacco Use  . Smoking status: Never Smoker  . Smokeless tobacco: Never Used  Substance Use Topics  . Alcohol use: No  . Drug use: No    Review of Systems Per HPI unless specifically indicated above     Objective:    BP (!) 143/87   Pulse 70   Ht 5\' 7"  (1.702 m)   Wt 238 lb 9.6 oz (108.2 kg)   BMI 37.37 kg/m    Wt Readings from Last 3 Encounters:  05/08/19 238 lb 9.6 oz (108.2 kg)  04/24/19 237 lb (107.5 kg)  03/20/19 246 lb (111.6 kg)    Physical Exam Vitals signs reviewed.  Constitutional:      General: He is not in acute distress.    Appearance: Normal appearance. He is well-developed. He is obese.  HENT:     Head: Normocephalic and atraumatic.  Neck:     Musculoskeletal: Normal range of motion and neck supple.     Vascular: No carotid bruit.  Cardiovascular:     Rate and Rhythm: Normal rate and regular rhythm.     Pulses: Normal pulses.     Heart sounds: Normal heart sounds. No murmur. No friction rub. No gallop.   Pulmonary:     Effort: Pulmonary effort is normal.     Breath sounds: Normal breath sounds.  Skin:    General: Skin is warm and dry.     Capillary Refill: Capillary refill takes less than 2 seconds.  Neurological:     General: No focal deficit present.     Mental Status: He is alert and oriented to person, place, and time. Mental status is at baseline.  Psychiatric:        Mood and Affect: Mood normal.        Behavior: Behavior normal.        Thought Content: Thought content normal.  Judgment: Judgment normal.     Results for orders placed or performed in visit on 05/08/19  POCT glycosylated hemoglobin (Hb A1C)  Result Value Ref Range   Hemoglobin A1C 6.0 (A) 4.0 - 5.6 %   HbA1c POC (<> result, manual entry)     HbA1c, POC (prediabetic range)     HbA1c, POC (controlled diabetic range)        Assessment & Plan:   Problem List Items Addressed This Visit      Cardiovascular and Mediastinum   Hypertension - Primary Uncontrolled. Improved over last check at cancer center, but needs to be at goal < 130/80.  Patient currently asymptomatic.  Plan: 1. INCREASE amlodipine to 10 mg daily. 2. Continue olmesartan 40 mg daily (separate pills), carvedilol, chlorthalidone without changes. 3. Labs for TSH in 4 months as last check was normal. 4. Follow-up 4  months    Relevant Medications   amLODipine (NORVASC) 10 MG tablet   olmesartan (BENICAR) 40 MG tablet     Endocrine   T2DM (type 2 diabetes mellitus) ([place]) Well-controlledDM with A1c 6.0 improved from 6.5 and goal A1c < 7.0%. - Complications - mixed hyperlipidemia and peripheral neuropathy.  Plan:  1. Continue current therapy: metformin 500 mg bid 2. Encourage improved lifestyle: - low carb/low glycemic diet reinforced prior education - Increase physical activity to 30 minutes most days of the week.  Explained that increased physical activity increases body's use of sugar for energy. 3. Check fasting am CBG and bring log to next visit for review 4. Continue ASA, ARB and Statin 5. Advised to schedule DM ophtho exam, send record. 6. Follow-up 4 months     Relevant Medications   olmesartan (BENICAR) 40 MG tablet   Other Relevant Orders   Lipid panel   POCT glycosylated hemoglobin (Hb A1C) (Completed)     Other   Class 2 severe obesity due to excess calories with serious comorbidity and body mass index (BMI) of 38.0 to 38.9 in adult [facility]) Improving today.  Patient continues healthy diet, active work.  Increase activity after work recommended.  Follow-up 4 months and annually.      Meds ordered this encounter  Medications  . amLODipine (NORVASC) 10 MG tablet    Sig: Take 1 tablet (10 mg total) by mouth daily.    Dispense:  90 tablet    Refill:  1    Order Specific Question:   Supervising Provider    Answer:   Olin Hauser [[id number]]  . olmesartan (BENICAR) 40 MG tablet    Sig: Take 1 tablet (40 mg total) by mouth daily.    Dispense:  90 tablet    Refill:  1    Order Specific Question:   Supervising Provider    Answer:   Olin Hauser [[id number]]    Follow up plan: Return in about 4 months (around 09/08/2019) for diabetes.  [name], DNP, AGPCNP-BC Adult Gerontology Primary Care Nurse Practitioner [facility]  05/08/2019, 8:13 AM

## 2019-05-08 NOTE — Patient Instructions (Signed)
Charles Mathis,   Thank you for coming in to clinic today.  1. Continue metformin without changes  2. Increase amlodipine. Take 10 mg daily - Continue olmesartan 40 mg daily.  - These are now in separate pills for easier adjusting.  3. Continue chlorthalidone 1/2 tablet (12.5 mg) daily  4. Get labs prior to your next visit at the medical mall.  Orders are placed. Only checking labs that cancer center doesn't normally check.  Please schedule a follow-up appointment. Return in about 4 months (around 09/08/2019) for diabetes.  If you have any other questions or concerns, please feel free to call the clinic or send a message through [place]. You may also schedule an earlier appointment if necessary.  You will receive a survey after today's visit either digitally by e-mail or paper by [employer]. Your experiences and feedback matter to [place].  Please respond so we know how we are doing as we provide care for you.  [name], DNP, AGNP-BC Adult Gerontology Nurse Practitioner [place]

## 2019-05-08 NOTE — Progress Notes (Signed)
[facility] Progress Note  Virtual Visit Progress Note  I connected with Charles Mathis on 05/08/19 at 11:30 AM EDT by video enabled visit and verified that I am speaking with the correct person using two identifiers.   I discussed the limitations, risks, security and privacy concerns of performing an evaluation and management service by telemedicine and the availability of in-person appointments. I also discussed with the patient that there may be a patient responsible charge related to this service. The patient expressed understanding and agreed to proceed.   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 Sniskinski]; July CT 2020- mid anterior left kidney measuring 8.6 x 8.1 x 9.4 cm; CT chest negative  #April 06, 2019-Sutent 50 mg 2 weeks on 1 week off.   # DM- 2- OHA/ HTN  DIAGNOSIS: Left kidney cancer  STAGE: 3       ;GOALS: Cure  CURRENT/MOST RECENT THERAPY: Sutent    Cancer of left kidney ([place])  02/26/2019 Initial Diagnosis   Cancer of left kidney ([place])      HISTORY OF PRESENTING ILLNESS: Charles Mathis 48 y.o.  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  He has completed cycle 1 of Sutent.  Cycle 2 was held due to elevated diastolic blood pressure.  He says that he saw his primary care doctor today and was prescribed some new medications but he has not yet started taking them.  Blood pressure was again elevated today.  He infrequently checks it at home but has  blood pressure cuff and has previously kept a log of his blood pressures.  He feels well and denies chest pain, shortness of breath, headache.  He denies peripheral edema, fatigue, insomnia, mouth sores, changes in mood, skin changes, or new pains.  He had significant fatigue and body aches during cycle 1 and appetite was poor.  Mild nausea.  Symptoms have improved since he has been off Sutent.  No rash.  No diarrhea.  Review of Systems  Constitutional: Positive for malaise/fatigue. Negative for chills, diaphoresis, fever and weight loss.  HENT: Negative for nosebleeds and sore throat.   Eyes: Negative for double vision.  Respiratory: Negative for cough, hemoptysis, sputum production, shortness of breath and wheezing.   Cardiovascular: Negative for chest pain, palpitations, orthopnea and leg swelling.  Gastrointestinal: Positive for nausea. Negative for abdominal pain, blood in stool, constipation, diarrhea, heartburn, melena and vomiting.  Genitourinary: Negative for dysuria, frequency and urgency.  Musculoskeletal: Positive for myalgias. Negative for back pain and joint pain.  Skin: Negative.  Negative for itching and rash.  Neurological: Negative for dizziness, tingling, focal weakness, weakness and headaches.  Endo/Heme/Allergies: Does not bruise/bleed easily.  Psychiatric/Behavioral: Negative for depression. The patient is not nervous/anxious and does not have insomnia.      MEDICAL HISTORY:  Past Medical History:  Diagnosis Date  . Allergy   . Diabetes mellitus without complication ([place])   . Hypertension     SURGICAL HISTORY: Past Surgical History:  Procedure Laterality Date  . CYSTOSCOPY  02/06/2019  Procedure: CYSTOSCOPY;  Surgeon: Billey Co, MD;  Location: [facility] ORS;  Service: Urology;;  . LAPAROSCOPIC NEPHRECTOMY, HAND ASSISTED Left 02/06/2019   Procedure: HAND ASSISTED LAPAROSCOPIC LEFT RADICAL NEPHRECTOMY;  Surgeon: Billey Co, MD;  Location: [facility] ORS;  Service:  Urology;  Laterality: Left;  . NO PAST SURGERIES      SOCIAL HISTORY: Social History   Socioeconomic History  . Marital status: Married    Spouse name: Not on file  . Number of children: Not on file  . Years of education: Not on file  . Highest education level: Not on file  Occupational History  . Not on file  Social Needs  . Financial resource strain: Not on file  . Food insecurity    Worry: Not on file    Inability: Not on file  . Transportation needs    Medical: Not on file    Non-medical: Not on file  Tobacco Use  . Smoking status: Never Smoker  . Smokeless tobacco: Never Used  Substance and Sexual Activity  . Alcohol use: No  . Drug use: No  . Sexual activity: Yes    Birth control/protection: Other-see comments    Comment: mutually monogamous relationship partner postmenopausal  Lifestyle  . Physical activity    Days per week: Not on file    Minutes per session: Not on file  . Stress: Not on file  Relationships  . Social Herbalist on phone: Not on file    Gets together: Not on file    Attends religious service: Not on file    Active member of club or organization: Not on file    Attends meetings of clubs or organizations: Not on file    Relationship status: Not on file  . Intimate partner violence    Fear of current or ex partner: Not on file    Emotionally abused: Not on file    Physically abused: Not on file    Forced sexual activity: Not on file  Other Topics Concern  . Not on file  Social History Narrative   In [place]/ with wife; son Cherlyn Cushing syndrome]; no smoking/ no alcohol; works for [employer].     FAMILY HISTORY: Family History  Problem Relation Age of Onset  . Diabetes Mother   . Heart disease Mother   . Mental illness Mother   . Thyroid disease Mother   . Heart disease Father   . Diabetes Father   . Mental illness Father     ALLERGIES:  has No Known Allergies.  MEDICATIONS:  Current Outpatient Medications   Medication Sig Dispense Refill  . acetaminophen (TYLENOL) 325 MG tablet Take by mouth every 6 (six) hours as needed for headache.    Marland Kitchen amLODipine (NORVASC) 10 MG tablet Take 1 tablet (10 mg total) by mouth daily. 90 tablet 1  . amLODipine-olmesartan (AZOR) 5-40 MG tablet Take 1 tablet by mouth daily. 90 tablet 0  . aspirin EC 81 MG tablet Take 81 mg by mouth daily.    Marland Kitchen atorvastatin (LIPITOR) 40 MG tablet Take 1 tablet (40 mg total) by mouth daily. 30 tablet 2  . carvedilol (COREG) 25 MG tablet TAKE 1 TABLET TWICE A DAY WITH A MEAL (Patient taking differently: Take 25 mg by mouth 2 (two) times daily with a meal. TAKE 1 TABLET TWICE A DAY WITH A MEAL) 180 tablet 0  . cetirizine (ZYRTEC) 10 MG tablet Take 10 mg by mouth daily.    Marland Kitchen  chlorthalidone (HYGROTON) 25 MG tablet Take 0.5 tablets (12.5 mg total) by mouth daily. 45 tablet 0  . docusate sodium (COLACE) 100 MG capsule Take 1 capsule (100 mg total) by mouth 2 (two) times daily. 10 capsule 0  . ferrous sulfate 324 MG TBEC Take 324 mg by mouth.    . fluticasone (FLONASE) 50 MCG/ACT nasal spray Place 2 sprays into both nostrils daily.    Marland Kitchen levothyroxine (SYNTHROID) 100 MCG tablet Take 1 tablet (100 mcg total) by mouth daily. 90 tablet 3  . metFORMIN (GLUCOPHAGE) 500 MG tablet Take 1 tablet (500 mg total) by mouth 2 (two) times daily with a meal. 180 tablet 0  . olmesartan (BENICAR) 40 MG tablet Take 1 tablet (40 mg total) by mouth daily. 90 tablet 1  . potassium chloride (K-DUR) 10 MEQ tablet Take 1 tablet (10 mEq total) by mouth daily. 90 tablet 0  . pseudoephedrine (SUDAFED) 30 MG tablet Take 30 mg by mouth every 4 (four) hours as needed for congestion.    . SUNItinib (SUTENT) 50 MG capsule Take 1 capsule (50 mg total) by mouth daily. One pill a day; 2 weeks-On and 1 week-OFF (Patient not taking: Reported on 05/08/2019) 14 capsule 6   No current facility-administered medications for this visit.     PHYSICAL EXAMINATION: Exam limited due to  telemedicine ECOG PERFORMANCE STATUS: 0 - Asymptomatic  Physical Exam  Constitutional: He is well-developed, well-nourished, and in no distress.  Psychiatric: Mood, affect and judgment normal.    LABORATORY DATA:  I have reviewed the data as listed Lab Results  Component Value Date   WBC 2.9 (L) 04/24/2019   HGB 11.1 (L) 04/24/2019   HCT 36.2 (L) 04/24/2019   MCV 78.4 (L) 04/24/2019   PLT 121 (L) 04/24/2019   Recent Labs    02/04/19 1021  02/09/19 0448 02/26/19 1130 04/24/19 0956  NA 138   < > 136 136 138  K 3.6   < > 3.3* 3.4* 3.1*  CL 103   < > 101 98 100  CO2 26   < > 26 27 28   GLUCOSE 129*   < > 126* 162* 107*  BUN 17   < > 17 22* 21*  CREATININE 0.83   < > 1.25* 1.37* 1.28*  CALCIUM 9.3   < > 8.3* 9.2 8.9  GFRNONAA >60   < > >60 >60 >60  GFRAA >60   < > >60 >60 >60  PROT 8.0  --   --  7.8 7.7  ALBUMIN 4.1  --   --  3.8 3.7  AST 26  --   --  23 27  ALT 31  --   --  23 25  ALKPHOS 59  --   --  79 85  BILITOT 0.7  --   --  0.5 0.8   < > = values in this interval not displayed.    RADIOGRAPHIC STUDIES: I have personally reviewed the radiological images as listed and agreed with the findings in the report. No results found.  ASSESSMENT & PLAN:  Cancer of left kidney ([place]) # Kidney Cancer-clear cell carcinoma-stage III, pT3N0, Fuhrman grade 2 (high risk), s/p radical nephrectomy.  Currently receiving adjuvant Sutent 50 mg, 2 weeks on 1 week off.s/p cycle 1.  Cycle 2 was scheduled to start on 10/12- held due to elevated blood pressure (see below). Tolerating sutent with moderate side effects.   # Hypertension- diastolic previously elevated at 105.  Poor control.  He was seen by primary care this morning.  Amlodipine increased to 10 mg daily; olmesartan 40 mg daily, carvedilol, and chlorthialidone continued without changes.  #Patient reports 'normal' to 'mildly elevated' blood pressures at home but is unable to provide log.  Given recent medication changes, recommend  keeping log for next week, continuing to hold Sutent cycle 2, and will reevaluate in clinic next week.  Fatigue-grade 2.  As expected on Sutent.  TSH was elevated in July at 5.613.  # Anemia- hemoglobin was 8.3. 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  Mild.  Asymptomatic.  Continue to monitor.  Return to clinic  RTC in 1 week for re-evaluation w/ Raelee Rossmann   All questions were answered. The patient knows to call the clinic with any problems, questions or concerns.  I discussed the assessment and treatment plan with the patient. The patient was provided an opportunity to ask questions and all were answered. The patient agreed with the plan and demonstrated an understanding of the instructions.   The patient was advised to call back or seek an in-person evaluation if the symptoms worsen or if the condition fails to improve as anticipated.   I provided 10 minutes of face-to-face video visit time during this encounter, and > 50% was spent counseling as documented under my assessment & plan.   Beckey Rutter, DNP, AGNP-C [facility]: Dr. Rogue Bussing

## 2019-05-15 ENCOUNTER — Encounter: Payer: Self-pay | Admitting: Nurse Practitioner

## 2019-05-15 ENCOUNTER — Other Ambulatory Visit: Payer: Self-pay

## 2019-05-15 ENCOUNTER — Inpatient Hospital Stay (HOSPITAL_BASED_OUTPATIENT_CLINIC_OR_DEPARTMENT_OTHER): Payer: Self-pay | Admitting: Nurse Practitioner

## 2019-05-15 VITALS — BP 138/76 | HR 76 | Temp 97.1°F | Resp 16 | Wt 240.0 lb

## 2019-05-15 DIAGNOSIS — T50905A Adverse effect of unspecified drugs, medicaments and biological substances, initial encounter: Secondary | ICD-10-CM

## 2019-05-15 DIAGNOSIS — D701 Agranulocytosis secondary to cancer chemotherapy: Secondary | ICD-10-CM

## 2019-05-15 DIAGNOSIS — I10 Essential (primary) hypertension: Secondary | ICD-10-CM

## 2019-05-15 DIAGNOSIS — C642 Malignant neoplasm of left kidney, except renal pelvis: Secondary | ICD-10-CM

## 2019-05-15 DIAGNOSIS — R5383 Other fatigue: Secondary | ICD-10-CM

## 2019-05-15 DIAGNOSIS — I158 Other secondary hypertension: Secondary | ICD-10-CM

## 2019-05-15 DIAGNOSIS — T451X5A Adverse effect of antineoplastic and immunosuppressive drugs, initial encounter: Secondary | ICD-10-CM

## 2019-05-15 MED ORDER — POTASSIUM CHLORIDE CRYS ER 10 MEQ PO TBCR: 10.0000 meq | EXTENDED_RELEASE_TABLET | Freq: Every day | ORAL | 0 refills | Status: DC

## 2019-05-15 MED ORDER — CHLORTHALIDONE 25 MG PO TABS: 12.5000 mg | ORAL_TABLET | Freq: Every day | ORAL | 0 refills | Status: DC

## 2019-05-15 NOTE — Assessment & Plan Note (Signed)
#  Kidney cancer-clear cell carcinoma.  Stage III, pT3N0, Fuhrman grade 2 (high risk), s/p radical nephrectomy.  Currently receiving adjuvant Sutent 50 mg, 2 weeks on, 1 week off, s/p cycle 1. Tolerating with moderate to severe side effects (see below).  #Cycle 2 scheduled to start on 04/27/2019; delayed due to elevated diastolic blood pressure. Blood pressure improved. Home log supports this as well. Proceed with Cycle 2.   # Hypertension- diastolic blood pressure previously elevated to 105- poorly controlled. Risks including stroke/heart attacks, and cardiovascular problems discussed. Blood pressure in clinic today improved. Log from patient reviewed and within goal. Continue to log blood pressures daily   # Fatigue- grade 2 with Sutent. Expected side effect. Discussed that per Up[employer], 62% of those on Sutent will expect fatigue. TSH in July 5.613 (elevated). On levothyroxine per PCP. Will check TSH with next visit in setting of Sutent. May require dose adjustment of Sutent or temporary increase in levothyroxine. Continue to monitor.   # Anemia- Hemoglobin 11.1 on 04/24/2019. Continue PO Iron. Will check cbc at next visit.   #Leukopenia & Thrombocytopenia- mild. Secondary to sutent. Asymptomatic. Continue to monitor.   Disposition:  # Start cycle 2 of Sutent.  # 1 week- virtual/doximity visit with Dr. Rogue Bussing # 4 weeks - labs, Dr. Rogue Bussing for consideration of cycle 3

## 2019-05-15 NOTE — Progress Notes (Signed)
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 Sniskinski]; July CT 2020- mid anterior left kidney measuring 8.6 x 8.1 x 9.4 cm; CT chest negative  #April 06, 2019-Sutent 50 mg 2 weeks on 1 week off.   # DM- 2- OHA/ HTN  DIAGNOSIS: Left kidney cancer  STAGE: 3       ;GOALS: Cure  CURRENT/MOST RECENT THERAPY: Sutent    Cancer of left kidney ([place])  02/26/2019 Initial Diagnosis   Cancer of left kidney ([place])      HISTORY OF PRESENTING ILLNESS:  Charles Mathis 48 y.o.  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  Patient is currently status post cycle #1 of Sutent.  Cycle 2 of Sutent was scheduled to start on 04/27/2019 but was held due to elevated diastolic blood pressure.  In the interim, patient has been seen by his primary care doctor and amlodipine increased.  Today, he provides a log of blood pressures from home.  Says that he suffered fatigue with cycle 1, decreased appetite, mild nausea.  Symptoms improved between cycles.  Today, he feels well denying specific complaints and is ready to proceed with cycle 2.  No diarrhea, vomiting, rash.  Review of Systems  Constitutional: Negative for chills, diaphoresis, fever, malaise/fatigue and weight loss.  HENT: Negative for nosebleeds and sore throat.   Eyes: Negative for double vision.  Respiratory: Negative for cough, hemoptysis, sputum production, shortness of breath and wheezing.   Cardiovascular: Negative for chest pain, palpitations, orthopnea and leg swelling.  Gastrointestinal: Negative for abdominal pain, blood in stool, constipation, diarrhea, heartburn, melena, nausea and  vomiting.  Genitourinary: Negative for dysuria, frequency and urgency.  Musculoskeletal: Positive for joint pain (knees (chronic)). Negative for back pain and myalgias.  Skin: Negative.  Negative for itching and rash.  Neurological: Negative for dizziness, tingling, focal weakness, weakness and headaches.  Endo/Heme/Allergies: Does not bruise/bleed easily.  Psychiatric/Behavioral: Negative for depression. The patient is not nervous/anxious and does not have insomnia.      MEDICAL HISTORY:  Past Medical History:  Diagnosis Date  . Allergy   . Diabetes mellitus without complication ([place])   . Hypertension     SURGICAL HISTORY: Past Surgical History:  Procedure Laterality Date  . CYSTOSCOPY  02/06/2019   Procedure: CYSTOSCOPY;  Surgeon: Billey Co, MD;  Location: [facility] ORS;  Service: Urology;;  . LAPAROSCOPIC NEPHRECTOMY, HAND ASSISTED Left 02/06/2019   Procedure: HAND ASSISTED LAPAROSCOPIC LEFT RADICAL NEPHRECTOMY;  Surgeon: Billey Co, MD;  Location: [facility] ORS;  Service: Urology;  Laterality: Left;  . NO PAST SURGERIES      SOCIAL HISTORY: Social History   Socioeconomic History  . Marital status: Married    Spouse name: Not on file  . Number of children: Not on file  . Years of education: Not on file  . Highest education level: Not on file  Occupational History  . Not on file  Social Needs  . Financial resource strain: Not on file  . Food insecurity    Worry: Not on file    Inability: Not on file  . Transportation needs    Medical: Not on file    Non-medical: Not on file  Tobacco Use  . Smoking status: Never Smoker  . Smokeless tobacco: Never Used  Substance and Sexual Activity  . Alcohol use: No  . Drug use: No  . Sexual activity: Yes    Birth control/protection: Other-see comments    Comment: mutually monogamous relationship partner postmenopausal  Lifestyle  . Physical activity    Days per week: Not on file    Minutes per session: Not on file  .  Stress: Not on file  Relationships  . Social Herbalist on phone: Not on file    Gets together: Not on file    Attends religious service: Not on file    Active member of club or organization: Not on file    Attends meetings of clubs or organizations: Not on file    Relationship status: Not on file  . Intimate partner violence    Fear of current or ex partner: Not on file    Emotionally abused: Not on file    Physically abused: Not on file    Forced sexual activity: Not on file  Other Topics Concern  . Not on file  Social History Narrative   In [place]/ with wife; son Cherlyn Cushing syndrome]; no smoking/ no alcohol; works for [employer], [employer].     FAMILY HISTORY: Family History  Problem Relation Age of Onset  . Diabetes Mother   . Heart disease Mother   . Mental illness Mother   . Thyroid disease Mother   . Heart disease Father   . Diabetes Father   . Mental illness Father     ALLERGIES:  has No Known Allergies.  MEDICATIONS:  Current Outpatient Medications  Medication Sig Dispense Refill  . acetaminophen (TYLENOL) 325 MG tablet Take by mouth every 6 (six) hours as needed for headache.    Marland Kitchen amLODipine (NORVASC) 10 MG tablet Take 1 tablet (10 mg total) by mouth daily. 90 tablet 1  . aspirin EC 81 MG tablet Take 81 mg by mouth daily.    Marland Kitchen atorvastatin (LIPITOR) 40 MG tablet Take 1 tablet (40 mg total) by mouth daily. 30 tablet 2  . carvedilol (COREG) 25 MG tablet TAKE 1 TABLET TWICE A DAY WITH A MEAL (Patient taking differently: Take 25 mg by mouth 2 (two) times daily with a meal. TAKE 1 TABLET TWICE A DAY WITH A MEAL) 180 tablet 0  . cetirizine (ZYRTEC) 10 MG tablet Take 10 mg by mouth daily.    . chlorthalidone (HYGROTON) 25 MG tablet Take 0.5 tablets (12.5 mg total) by mouth daily. 45 tablet 0  . docusate sodium (COLACE) 100 MG capsule Take 1 capsule (100 mg total) by mouth 2 (two) times daily. 10 capsule 0  . ferrous sulfate 324 MG TBEC Take 324 mg by mouth.     . fluticasone (FLONASE) 50 MCG/ACT nasal spray Place 2 sprays into both nostrils daily.    Marland Kitchen levothyroxine (SYNTHROID) 100 MCG tablet Take 1 tablet (100 mcg total) by mouth daily. 90 tablet 3  . metFORMIN (GLUCOPHAGE) 500 MG tablet Take 1 tablet (500 mg total) by mouth 2 (two) times daily with a meal. 180 tablet 0  . olmesartan (BENICAR) 40 MG tablet Take 1 tablet (40 mg total) by mouth daily. 90 tablet 1  . potassium chloride (K-DUR) 10 MEQ tablet Take 1 tablet (10 mEq total) by mouth daily. 90 tablet 0  . pseudoephedrine (SUDAFED) 30 MG tablet Take 30 mg by mouth every 4 (four) hours as needed for congestion.    Marland Kitchen  SUNItinib (SUTENT) 50 MG capsule Take 1 capsule (50 mg total) by mouth daily. One pill a day; 2 weeks-On and 1 week-OFF 14 capsule 6   No current facility-administered medications for this visit.     PHYSICAL EXAMINATION: ECOG PERFORMANCE STATUS: 0 - Asymptomatic  Vitals:   05/15/19 1108  BP: 138/76  Pulse: 76  Resp: 16  Temp: (!) 97.1 F (36.2 C)  SpO2: 99%   Filed Weights   05/15/19 1108  Weight: 240 lb (108.9 kg)    Physical Exam  Constitutional: He is oriented to person, place, and time and well-developed, well-nourished, and in no distress.  Unaccompanied, wearing mask.  HENT:  Head: Normocephalic and atraumatic.  Mouth/Throat: Oropharynx is clear and moist. No oropharyngeal exudate.  Eyes: Conjunctivae are normal. No scleral icterus.  Neck: Normal range of motion. Neck supple.  Cardiovascular: Normal rate and regular rhythm.  Pulmonary/Chest: Effort normal and breath sounds normal. No respiratory distress. He has no wheezes.  Abdominal: Soft. Bowel sounds are normal. He exhibits no distension and no mass. There is no abdominal tenderness. There is no rebound and no guarding.  Musculoskeletal: Normal range of motion.        General: No tenderness or edema.  Neurological: He is alert and oriented to person, place, and time.  Skin: Skin is warm and dry.   Psychiatric: Mood and affect normal.    LABORATORY DATA:  I have reviewed the data as listed Lab Results  Component Value Date   WBC 2.9 (L) 04/24/2019   HGB 11.1 (L) 04/24/2019   HCT 36.2 (L) 04/24/2019   MCV 78.4 (L) 04/24/2019   PLT 121 (L) 04/24/2019   Recent Labs    02/04/19 1021  02/09/19 0448 02/26/19 1130 04/24/19 0956  NA 138   < > 136 136 138  K 3.6   < > 3.3* 3.4* 3.1*  CL 103   < > 101 98 100  CO2 26   < > 26 27 28   GLUCOSE 129*   < > 126* 162* 107*  BUN 17   < > 17 22* 21*  CREATININE 0.83   < > 1.25* 1.37* 1.28*  CALCIUM 9.3   < > 8.3* 9.2 8.9  GFRNONAA >60   < > >60 >60 >60  GFRAA >60   < > >60 >60 >60  PROT 8.0  --   --  7.8 7.7  ALBUMIN 4.1  --   --  3.8 3.7  AST 26  --   --  23 27  ALT 31  --   --  23 25  ALKPHOS 59  --   --  79 85  BILITOT 0.7  --   --  0.5 0.8   < > = values in this interval not displayed.    RADIOGRAPHIC STUDIES: I have personally reviewed the radiological images as listed and agreed with the findings in the report. No results found.  ASSESSMENT & PLAN:  Cancer of left kidney ([place]) #Kidney cancer-clear cell carcinoma.  Stage III, pT3N0, Fuhrman grade 2 (high risk), s/p radical nephrectomy.  Currently receiving adjuvant Sutent 50 mg, 2 weeks on, 1 week off, s/p cycle 1. Tolerating with moderate to severe side effects (see below).  #Cycle 2 scheduled to start on 04/27/2019; delayed due to elevated diastolic blood pressure. Blood pressure improved. Home log supports this as well. Proceed with Cycle 2.   # Hypertension- diastolic blood pressure previously elevated to 105- poorly controlled. Risks including stroke/heart attacks,  and cardiovascular problems discussed. Blood pressure in clinic today improved. Log from patient reviewed and within goal. Continue to log blood pressures daily   # Fatigue- grade 2 with Sutent. Expected side effect. Discussed that per UpToDate, 62% of those on Sutent will expect fatigue. TSH in July 5.613  (elevated). On levothyroxine per PCP. Will check TSH with next visit in setting of Sutent. May require dose adjustment of Sutent or temporary increase in levothyroxine. Continue to monitor.   # Anemia- Hemoglobin 11.1 on 04/24/2019. Continue PO Iron. Will check cbc at next visit.   #Leukopenia & Thrombocytopenia- mild. Secondary to sutent. Asymptomatic. Continue to monitor.   Disposition:  # Start cycle 2 of Sutent.  # 1 week- virtual/doximity visit with Dr. Rogue Bussing # 4 weeks - labs, Dr. Rogue Bussing for consideration of cycle 3  All questions were answered. The patient knows to call the clinic with any problems, questions or concerns.   Beckey Rutter, DNP, AGNP-C [facility]: Dr. Rogue Bussing

## 2019-05-22 ENCOUNTER — Inpatient Hospital Stay: Payer: Self-pay | Admitting: Internal Medicine

## 2019-06-04 ENCOUNTER — Other Ambulatory Visit: Payer: Self-pay

## 2019-06-05 ENCOUNTER — Other Ambulatory Visit: Payer: Self-pay

## 2019-06-05 ENCOUNTER — Inpatient Hospital Stay: Payer: Self-pay | Attending: Internal Medicine

## 2019-06-05 ENCOUNTER — Inpatient Hospital Stay (HOSPITAL_BASED_OUTPATIENT_CLINIC_OR_DEPARTMENT_OTHER): Payer: Self-pay | Admitting: Internal Medicine

## 2019-06-05 DIAGNOSIS — I10 Essential (primary) hypertension: Secondary | ICD-10-CM | POA: Insufficient documentation

## 2019-06-05 DIAGNOSIS — M2550 Pain in unspecified joint: Secondary | ICD-10-CM | POA: Insufficient documentation

## 2019-06-05 DIAGNOSIS — D6959 Other secondary thrombocytopenia: Secondary | ICD-10-CM | POA: Insufficient documentation

## 2019-06-05 DIAGNOSIS — Z905 Acquired absence of kidney: Secondary | ICD-10-CM | POA: Insufficient documentation

## 2019-06-05 DIAGNOSIS — G8929 Other chronic pain: Secondary | ICD-10-CM | POA: Insufficient documentation

## 2019-06-05 DIAGNOSIS — C642 Malignant neoplasm of left kidney, except renal pelvis: Secondary | ICD-10-CM

## 2019-06-05 DIAGNOSIS — Z79899 Other long term (current) drug therapy: Secondary | ICD-10-CM | POA: Insufficient documentation

## 2019-06-05 DIAGNOSIS — Z7984 Long term (current) use of oral hypoglycemic drugs: Secondary | ICD-10-CM | POA: Insufficient documentation

## 2019-06-05 DIAGNOSIS — Z7982 Long term (current) use of aspirin: Secondary | ICD-10-CM | POA: Insufficient documentation

## 2019-06-05 DIAGNOSIS — E119 Type 2 diabetes mellitus without complications: Secondary | ICD-10-CM | POA: Insufficient documentation

## 2019-06-05 DIAGNOSIS — E1165 Type 2 diabetes mellitus with hyperglycemia: Secondary | ICD-10-CM

## 2019-06-05 DIAGNOSIS — R5383 Other fatigue: Secondary | ICD-10-CM | POA: Insufficient documentation

## 2019-06-05 LAB — CBC WITH DIFFERENTIAL/PLATELET
Abs Immature Granulocytes: 0.01 10*3/uL (ref 0.00–0.07)
Basophils Absolute: 0 10*3/uL (ref 0.0–0.1)
Basophils Relative: 0 %
Eosinophils Absolute: 0.4 10*3/uL (ref 0.0–0.5)
Eosinophils Relative: 9 %
HCT: 37.4 % — ABNORMAL LOW (ref 39.0–52.0)
Hemoglobin: 11.7 g/dL — ABNORMAL LOW (ref 13.0–17.0)
Immature Granulocytes: 0 %
Lymphocytes Relative: 28 %
Lymphs Abs: 1.4 10*3/uL (ref 0.7–4.0)
MCH: 25.1 pg — ABNORMAL LOW (ref 26.0–34.0)
MCHC: 31.3 g/dL (ref 30.0–36.0)
MCV: 80.3 fL (ref 80.0–100.0)
Monocytes Absolute: 0.5 10*3/uL (ref 0.1–1.0)
Monocytes Relative: 10 %
Neutro Abs: 2.6 10*3/uL (ref 1.7–7.7)
Neutrophils Relative %: 53 %
Platelets: 127 10*3/uL — ABNORMAL LOW (ref 150–400)
RBC: 4.66 MIL/uL (ref 4.22–5.81)
RDW: 18.3 % — ABNORMAL HIGH (ref 11.5–15.5)
WBC: 4.9 10*3/uL (ref 4.0–10.5)
nRBC: 0 % (ref 0.0–0.2)

## 2019-06-05 LAB — COMPREHENSIVE METABOLIC PANEL
ALT: 27 U/L (ref 0–44)
AST: 26 U/L (ref 15–41)
Albumin: 3.8 g/dL (ref 3.5–5.0)
Alkaline Phosphatase: 77 U/L (ref 38–126)
Anion gap: 8 (ref 5–15)
BUN: 22 mg/dL — ABNORMAL HIGH (ref 6–20)
CO2: 27 mmol/L (ref 22–32)
Calcium: 8.8 mg/dL — ABNORMAL LOW (ref 8.9–10.3)
Chloride: 101 mmol/L (ref 98–111)
Creatinine, Ser: 1.3 mg/dL — ABNORMAL HIGH (ref 0.61–1.24)
GFR calc Af Amer: >60 mL/min (ref >60)
GFR calc non Af Amer: >60 mL/min (ref >60)
Glucose, Bld: 133 mg/dL — ABNORMAL HIGH (ref 70–99)
Potassium: 3.3 mmol/L — ABNORMAL LOW (ref 3.5–5.1)
Sodium: 136 mmol/L (ref 135–145)
Total Bilirubin: 0.8 mg/dL (ref 0.3–1.2)
Total Protein: 7.5 g/dL (ref 6.5–8.1)

## 2019-06-05 LAB — TSH: TSH: 0.894 u[IU]/mL (ref 0.350–4.500)

## 2019-06-05 MED ORDER — METFORMIN HCL 500 MG PO TABS: 500.0000 mg | ORAL_TABLET | Freq: Two times a day (BID) | ORAL | 0 refills | Status: DC

## 2019-06-05 MED ORDER — CARVEDILOL 25 MG PO TABS: ORAL_TABLET | ORAL | 0 refills | Status: DC

## 2019-06-05 NOTE — Assessment & Plan Note (Addendum)
#  Kidney cancer-clear cell carcinoma.  Stage III, pT3N0- Currently receiving adjuvant Sutent 50 mg, 2 weeks on, 1 week off. Tolerating with mild -moderate side effects [see below].  #Currently on second cycle; labs adequate.  Continue Sutent.  # Hypertension-from Sutent.  Improved.  Currently systolic 0000000.  Recommend keeping a log of blood pressure at home./Awaiting a new machine.  # Fatigue-mild to moderate.  On Sutent.; on synthroid 100 mcg/d;  await TSH from today.   # Anemia/Thrombocytopenia-from Sutent today-127- Hemoglobin 11- Continue PO Iron.  # Disposition:  # 4 weeks- MD-VIDEO; labs- cbc/cmp- Dr.B

## 2019-06-05 NOTE — Progress Notes (Signed)
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 Sniskinski]; July CT 2020- mid anterior left kidney measuring 8.6 x 8.1 x 9.4 cm; CT chest negative  #April 06, 2019-Sutent 50 mg 2 weeks on 1 week off.   # DM- 2- OHA/ HTN  DIAGNOSIS: Left kidney cancer  STAGE: 3       ;GOALS: Cure  CURRENT/MOST RECENT THERAPY: Sutent    Cancer of left kidney ([place])  02/26/2019 Initial Diagnosis   Cancer of left kidney ([place])      HISTORY OF PRESENTING ILLNESS:  Charles Mathis 48 y.o.  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    Patient is currently on second cycle of Sutent.   Patient states his blood pressures are better controlled at home; however he does not have a log of blood pressures with because his machine broke.  Admits to mild fatigue.  Admits to lightening of the skin.   No diarrhea.  Chronic joint pains.  Denies any worsening headaches.  Review of Systems  Constitutional: Negative for chills, diaphoresis, fever, malaise/fatigue and weight loss.  HENT: Negative for nosebleeds and sore throat.   Eyes: Negative for double vision.  Respiratory: Negative for cough, hemoptysis, sputum production, shortness of breath and wheezing.   Cardiovascular: Negative for chest pain, palpitations, orthopnea and leg swelling.  Gastrointestinal: Negative for abdominal pain, blood in stool, constipation, diarrhea, heartburn, melena, nausea and vomiting.  Genitourinary: Negative for dysuria, frequency and urgency.  Musculoskeletal: Positive for joint pain (knees (chronic)). Negative for back pain and myalgias.  Skin: Negative.   Negative for itching and rash.  Neurological: Negative for dizziness, tingling, focal weakness, weakness and headaches.  Endo/Heme/Allergies: Does not bruise/bleed easily.  Psychiatric/Behavioral: Negative for depression. The patient is not nervous/anxious and does not have insomnia.      MEDICAL HISTORY:  Past Medical History:  Diagnosis Date  . Allergy   . Diabetes mellitus without complication ([place])   . Hypertension     SURGICAL HISTORY: Past Surgical History:  Procedure Laterality Date  . CYSTOSCOPY  02/06/2019   Procedure: CYSTOSCOPY;  Surgeon: Billey Co, MD;  Location: [facility] ORS;  Service: Urology;;  . LAPAROSCOPIC NEPHRECTOMY, HAND ASSISTED Left 02/06/2019   Procedure: HAND ASSISTED LAPAROSCOPIC LEFT RADICAL NEPHRECTOMY;  Surgeon: Billey Co, MD;  Location: [facility] ORS;  Service: Urology;  Laterality: Left;  . NO PAST SURGERIES      SOCIAL HISTORY: Social History   Socioeconomic History  . Marital status: Married    Spouse name: Not on file  . Number of children: Not on file  . Years of education: Not on file  . Highest education level: Not on file  Occupational History  . Not on file  Social Needs  . Financial resource strain: Not on file  . Food insecurity    Worry: Not on file    Inability: Not on file  . Transportation needs    Medical: Not on file    Non-medical: Not on file  Tobacco Use  . Smoking status: Never Smoker  . Smokeless tobacco: Never Used  Substance and Sexual Activity  . Alcohol use: No  . Drug use: No  .  Sexual activity: Yes    Birth control/protection: Other-see comments    Comment: mutually monogamous relationship partner postmenopausal  Lifestyle  . Physical activity    Days per week: Not on file    Minutes per session: Not on file  . Stress: Not on file  Relationships  . Social Herbalist on phone: Not on file    Gets together: Not on file    Attends religious service: Not on file    Active member of club or  organization: Not on file    Attends meetings of clubs or organizations: Not on file    Relationship status: Not on file  . Intimate partner violence    Fear of current or ex partner: Not on file    Emotionally abused: Not on file    Physically abused: Not on file    Forced sexual activity: Not on file  Other Topics Concern  . Not on file  Social History Narrative   In [place]/ with wife; son Charles Mathis syndrome]; no smoking/ no alcohol; works for [employer], [employer].     FAMILY HISTORY: Family History  Problem Relation Age of Onset  . Diabetes Mother   . Heart disease Mother   . Mental illness Mother   . Thyroid disease Mother   . Heart disease Father   . Diabetes Father   . Mental illness Father     ALLERGIES:  has No Known Allergies.  MEDICATIONS:  Current Outpatient Medications  Medication Sig Dispense Refill  . acetaminophen (TYLENOL) 325 MG tablet Take by mouth every 6 (six) hours as needed for headache.    Marland Kitchen amLODipine (NORVASC) 10 MG tablet Take 1 tablet (10 mg total) by mouth daily. 90 tablet 1  . aspirin EC 81 MG tablet Take 81 mg by mouth daily.    Marland Kitchen atorvastatin (LIPITOR) 40 MG tablet Take 1 tablet (40 mg total) by mouth daily. 30 tablet 2  . carvedilol (COREG) 25 MG tablet TAKE 1 TABLET TWICE A DAY WITH A MEAL (Patient taking differently: Take 25 mg by mouth 2 (two) times daily with a meal. TAKE 1 TABLET TWICE A DAY WITH A MEAL) 180 tablet 0  . cetirizine (ZYRTEC) 10 MG tablet Take 10 mg by mouth daily.    . chlorthalidone (HYGROTON) 25 MG tablet Take 0.5 tablets (12.5 mg total) by mouth daily. 45 tablet 0  . docusate sodium (COLACE) 100 MG capsule Take 1 capsule (100 mg total) by mouth 2 (two) times daily. 10 capsule 0  . ferrous sulfate 324 MG TBEC Take 324 mg by mouth.    . fluticasone (FLONASE) 50 MCG/ACT nasal spray Place 2 sprays into both nostrils daily.    Marland Kitchen levothyroxine (SYNTHROID) 100 MCG tablet Take 1 tablet (100 mcg total) by mouth daily. 90 tablet  3  . metFORMIN (GLUCOPHAGE) 500 MG tablet Take 1 tablet (500 mg total) by mouth 2 (two) times daily with a meal. 180 tablet 0  . olmesartan (BENICAR) 40 MG tablet Take 1 tablet (40 mg total) by mouth daily. 90 tablet 1  . potassium chloride (KLOR-CON) 10 MEQ tablet Take 1 tablet (10 mEq total) by mouth daily. 90 tablet 0  . pseudoephedrine (SUDAFED) 30 MG tablet Take 30 mg by mouth every 4 (four) hours as needed for congestion.    . SUNItinib (SUTENT) 50 MG capsule Take 1 capsule (50 mg total) by mouth daily. One pill a day; 2 weeks-On and 1 week-OFF 14 capsule 6  No current facility-administered medications for this visit.     PHYSICAL EXAMINATION: ECOG PERFORMANCE STATUS: 0 - Asymptomatic  Vitals:   06/05/19 0903  BP: (!) 147/97  Pulse: 75  Resp: 20  Temp: (!) 96.4 F (35.8 C)   There were no vitals filed for this visit.  Physical Exam  Constitutional: He is oriented to person, place, and time and well-developed, well-nourished, and in no distress.  Unaccompanied, wearing mask.  HENT:  Head: Normocephalic and atraumatic.  Mouth/Throat: Oropharynx is clear and moist. No oropharyngeal exudate.  Eyes: Conjunctivae are normal. No scleral icterus.  Neck: Normal range of motion. Neck supple.  Cardiovascular: Normal rate and regular rhythm.  Pulmonary/Chest: Effort normal and breath sounds normal. No respiratory distress. He has no wheezes.  Abdominal: Soft. Bowel sounds are normal. He exhibits no distension and no mass. There is no abdominal tenderness. There is no rebound and no guarding.  Musculoskeletal: Normal range of motion.        General: No tenderness or edema.  Neurological: He is alert and oriented to person, place, and time.  Skin: Skin is warm and dry.  Psychiatric: Mood and affect normal.    LABORATORY DATA:  I have reviewed the data as listed Lab Results  Component Value Date   WBC 4.9 06/05/2019   HGB 11.7 (L) 06/05/2019   HCT 37.4 (L) 06/05/2019   MCV  80.3 06/05/2019   PLT 127 (L) 06/05/2019   Recent Labs    02/26/19 1130 04/24/19 0956 06/05/19 0830  NA 136 138 136  K 3.4* 3.1* 3.3*  CL 98 100 101  CO2 27 28 27   GLUCOSE 162* 107* 133*  BUN 22* 21* 22*  CREATININE 1.37* 1.28* 1.30*  CALCIUM 9.2 8.9 8.8*  GFRNONAA >60 >60 >60  GFRAA >60 >60 >60  PROT 7.8 7.7 7.5  ALBUMIN 3.8 3.7 3.8  AST 23 27 26   ALT 23 25 27   ALKPHOS 79 85 77  BILITOT 0.5 0.8 0.8    RADIOGRAPHIC STUDIES: I have personally reviewed the radiological images as listed and agreed with the findings in the report. No results found.  ASSESSMENT & PLAN:  Cancer of left kidney ([place]) #Kidney cancer-clear cell carcinoma.  Stage III, pT3N0- Currently receiving adjuvant Sutent 50 mg, 2 weeks on, 1 week off. Tolerating with mild -moderate side effects [see below].  #Currently on second cycle; labs adequate.  Continue Sutent.  # Hypertension-from Sutent.  Improved.  Currently systolic 0000000.  Recommend keeping a log of blood pressure at home./Awaiting a new machine.  # Fatigue-mild to moderate.  On Sutent.; on synthroid 100 mcg/d;  await TSH from today.   # Anemia/Thrombocytopenia-from Sutent today-127- Hemoglobin 11- Continue PO Iron.  # Disposition:  # 4 weeks- MD-VIDEO; labs- cbc/cmp- Dr.B  All questions were answered. The patient knows to call the clinic with any problems, questions or concerns.   Beckey Rutter, DNP, AGNP-C [facility]: Dr. Rogue Bussing

## 2019-06-08 ENCOUNTER — Other Ambulatory Visit: Payer: Self-pay

## 2019-06-08 ENCOUNTER — Ambulatory Visit: Payer: Self-pay | Admitting: Internal Medicine

## 2019-06-09 NOTE — Assessment & Plan Note (Signed)
#   Kidney Cancer-clear cell carcinoma-stage III, pT3N0, Fuhrman grade 2 (high risk), s/p radical nephrectomy.  Currently receiving adjuvant Sutent 50 mg, 2 weeks on 1 week off.s/p cycle 1.  Cycle 2 was scheduled to start on 10/12- held due to elevated blood pressure (see below). Tolerating sutent with moderate side effects.   # Hypertension- diastolic previously elevated at 105.  Poor control.  He was seen by primary care this morning.  Amlodipine increased to 10 mg daily; olmesartan 40 mg daily, carvedilol, and chlorthialidone continued without changes.  #Patient reports 'normal' to 'mildly elevated' blood pressures at home but is unable to provide log.  Given recent medication changes, recommend keeping log for next week, continuing to hold Sutent cycle 2, and will reevaluate in clinic next week.  Fatigue-grade 2.  As expected on Sutent.  TSH was elevated in July at 5.613.  # Anemia- hemoglobin was 8.3. 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  Mild.  Asymptomatic.  Continue to monitor.  Return to clinic  RTC in 1 week for re-evaluation w/ Leor Whyte

## 2019-06-10 ENCOUNTER — Other Ambulatory Visit: Payer: Self-pay

## 2019-06-10 DIAGNOSIS — E785 Hyperlipidemia, unspecified: Secondary | ICD-10-CM

## 2019-06-10 DIAGNOSIS — E1165 Type 2 diabetes mellitus with hyperglycemia: Secondary | ICD-10-CM

## 2019-06-10 MED ORDER — ATORVASTATIN CALCIUM 40 MG PO TABS: 40.0000 mg | ORAL_TABLET | Freq: Every day | ORAL | 2 refills | Status: DC

## 2019-06-23 ENCOUNTER — Telehealth: Payer: Self-pay | Admitting: Nurse Practitioner

## 2019-06-23 DIAGNOSIS — J309 Allergic rhinitis, unspecified: Secondary | ICD-10-CM

## 2019-06-23 MED ORDER — PREDNISONE 20 MG PO TABS: ORAL_TABLET | ORAL | 0 refills | Status: DC

## 2019-06-23 NOTE — Progress Notes (Signed)
E visit for Allergic Rhinitis We are sorry that you are not feeling well.  Here is how we plan to help!  Based on what you have shared with me it looks like you have Allergic Rhinitis.  Rhinitis is when a reaction occurs that causes nasal congestion, runny nose, sneezing, and itching.  Most types of rhinitis are caused by an inflammation and are associated with symptoms in the eyes ears or throat. There are several types of rhinitis.  The most common are acute rhinitis, which is usually caused by a viral illness, allergic or seasonal rhinitis, and nonallergic or year-round rhinitis.  Nasal allergies occur certain times of the year.  Allergic rhinitis is caused when allergens in the air trigger the release of histamine in the body.  Histamine causes itching, swelling, and fluid to build up in the fragile linings of the nasal passages, sinuses and eyelids.  An itchy nose and clear discharge are common.  I recommend the following over the counter treatments: Continue claritin OTC daily  I also would recommend a nasal spray: Continue nasocort OTC daily  You may also benefit from eye drops such as: Systane 1-2 driops each eye twice daily as needed   I have prescribed some prednisone to see if will help - prednisone 20mg  2 tablets PO daily  HOME CARE:   You can use an over-the-counter saline nasal spray as needed  Avoid areas where there is heavy dust, mites, or molds  Stay indoors on windy days during the pollen season  Keep windows closed in home, at least in bedroom; use air conditioner.  Use high-efficiency house air filter  Keep windows closed in car, turn AC on re-circulate  Avoid playing out with dog during pollen season  GET HELP RIGHT AWAY IF:   If your symptoms do not improve within 10 days  You become short of breath  You develop yellow or green discharge from your nose for over 3 days  You have coughing fits  MAKE SURE YOU:   Understand these instructions  Will  watch your condition  Will get help right away if you are not doing well or get worse  Thank you for choosing an e-visit. Your e-visit answers were reviewed by a board certified advanced clinical practitioner to complete your personal care plan. Depending upon the condition, your plan could have included both over the counter or prescription medications. Please review your pharmacy choice. Be sure that the pharmacy you have chosen is open so that you can pick up your prescription now.  If there is a problem you may message your provider in [place] to have the prescription routed to another pharmacy. Your safety is important to [place]. If you have drug allergies check your prescription carefully.  For the next 24 hours, you can use MyChart to ask questions about today's visit, request a non-urgent call back, or ask for a work or school excuse from your e-visit provider. You will get an email in the next two days asking about your experience. I hope that your e-visit has been valuable and will speed your recovery.  5-10 minutes spent reviewing and documenting in chart.

## 2019-07-02 ENCOUNTER — Other Ambulatory Visit: Payer: Self-pay

## 2019-07-02 ENCOUNTER — Encounter: Payer: Self-pay | Admitting: Internal Medicine

## 2019-07-03 ENCOUNTER — Other Ambulatory Visit: Payer: Self-pay

## 2019-07-03 ENCOUNTER — Inpatient Hospital Stay: Payer: Self-pay | Attending: Internal Medicine | Admitting: Internal Medicine

## 2019-07-03 ENCOUNTER — Other Ambulatory Visit: Payer: Self-pay | Admitting: *Deleted

## 2019-07-03 ENCOUNTER — Inpatient Hospital Stay: Payer: Self-pay | Attending: Internal Medicine

## 2019-07-03 DIAGNOSIS — E039 Hypothyroidism, unspecified: Secondary | ICD-10-CM | POA: Insufficient documentation

## 2019-07-03 DIAGNOSIS — Z79899 Other long term (current) drug therapy: Secondary | ICD-10-CM | POA: Insufficient documentation

## 2019-07-03 DIAGNOSIS — Z452 Encounter for adjustment and management of vascular access device: Secondary | ICD-10-CM | POA: Insufficient documentation

## 2019-07-03 DIAGNOSIS — D500 Iron deficiency anemia secondary to blood loss (chronic): Secondary | ICD-10-CM | POA: Insufficient documentation

## 2019-07-03 DIAGNOSIS — E119 Type 2 diabetes mellitus without complications: Secondary | ICD-10-CM | POA: Insufficient documentation

## 2019-07-03 DIAGNOSIS — C642 Malignant neoplasm of left kidney, except renal pelvis: Secondary | ICD-10-CM | POA: Insufficient documentation

## 2019-07-03 DIAGNOSIS — D696 Thrombocytopenia, unspecified: Secondary | ICD-10-CM | POA: Insufficient documentation

## 2019-07-03 DIAGNOSIS — I10 Essential (primary) hypertension: Secondary | ICD-10-CM | POA: Insufficient documentation

## 2019-07-03 DIAGNOSIS — Z905 Acquired absence of kidney: Secondary | ICD-10-CM | POA: Insufficient documentation

## 2019-07-03 DIAGNOSIS — D649 Anemia, unspecified: Secondary | ICD-10-CM | POA: Insufficient documentation

## 2019-07-03 DIAGNOSIS — R5383 Other fatigue: Secondary | ICD-10-CM | POA: Insufficient documentation

## 2019-07-03 LAB — CBC WITH DIFFERENTIAL/PLATELET
Abs Immature Granulocytes: 0.03 10*3/uL (ref 0.00–0.07)
Basophils Absolute: 0 10*3/uL (ref 0.0–0.1)
Basophils Relative: 0 %
Eosinophils Absolute: 0.2 10*3/uL (ref 0.0–0.5)
Eosinophils Relative: 4 %
HCT: 38.7 % — ABNORMAL LOW (ref 39.0–52.0)
Hemoglobin: 12.1 g/dL — ABNORMAL LOW (ref 13.0–17.0)
Immature Granulocytes: 1 %
Lymphocytes Relative: 32 %
Lymphs Abs: 1.8 10*3/uL (ref 0.7–4.0)
MCH: 26.3 pg (ref 26.0–34.0)
MCHC: 31.3 g/dL (ref 30.0–36.0)
MCV: 84.1 fL (ref 80.0–100.0)
Monocytes Absolute: 0.4 10*3/uL (ref 0.1–1.0)
Monocytes Relative: 7 %
Neutro Abs: 3 10*3/uL (ref 1.7–7.7)
Neutrophils Relative %: 56 %
Platelets: 216 10*3/uL (ref 150–400)
RBC: 4.6 MIL/uL (ref 4.22–5.81)
RDW: 19.4 % — ABNORMAL HIGH (ref 11.5–15.5)
WBC: 5.4 10*3/uL (ref 4.0–10.5)
nRBC: 0 % (ref 0.0–0.2)

## 2019-07-03 LAB — COMPREHENSIVE METABOLIC PANEL
ALT: 29 U/L (ref 0–44)
AST: 25 U/L (ref 15–41)
Albumin: 4.1 g/dL (ref 3.5–5.0)
Alkaline Phosphatase: 65 U/L (ref 38–126)
Anion gap: 11 (ref 5–15)
BUN: 19 mg/dL (ref 6–20)
CO2: 26 mmol/L (ref 22–32)
Calcium: 9.1 mg/dL (ref 8.9–10.3)
Chloride: 98 mmol/L (ref 98–111)
Creatinine, Ser: 1.12 mg/dL (ref 0.61–1.24)
GFR calc Af Amer: >60 mL/min (ref >60)
GFR calc non Af Amer: >60 mL/min (ref >60)
Glucose, Bld: 110 mg/dL — ABNORMAL HIGH (ref 70–99)
Potassium: 3.6 mmol/L (ref 3.5–5.1)
Sodium: 135 mmol/L (ref 135–145)
Total Bilirubin: 0.8 mg/dL (ref 0.3–1.2)
Total Protein: 7.7 g/dL (ref 6.5–8.1)

## 2019-07-03 NOTE — Progress Notes (Signed)
I connected with Charles Mathis on 07/03/19 at  2:30 PM EST by video enabled telemedicine visit and verified that I am speaking with the correct person using two identifiers.  I discussed the limitations, risks, security and privacy concerns of performing an evaluation and management service by telemedicine and the availability of in-person appointments. I also discussed with the patient that there may be a patient responsible charge related to this service. The patient expressed understanding and agreed to proceed.    Other persons participating in the visit and their role in the encounter: RN/medical reconciliation Patient's location: Home. 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 Sniskinski]; July CT 2020- mid anterior left kidney measuring 8.6 x 8.1 x 9.4 cm; CT chest negative  #April 06, 2019-Sutent 50 mg 2 weeks on 1 week off.   # DM- 2- OHA/ HTN  DIAGNOSIS: Left kidney cancer  STAGE: 3       ;GOALS: Cure  CURRENT/MOST RECENT THERAPY: Sutent    Cancer of left kidney ([place])  02/26/2019 Initial Diagnosis   Cancer of left kidney [facility])     Chief Complaint: Kidney cancer    History of present illness:Charles Mathis 48 y.o.  male with history of stage III high risk kidney cancer currently on adjuvant Sutent is here for follow-up.  Patient denies any headaches.  Denies any nausea vomiting.  Denies any chest pain.  Patient fatigue has improved.  No rash on palms and soles.  Observation/objective: CBC-unremarkable except for mildly low hemoglobin of 12.1.  CMP within normal limits.  Assessment and plan: Cancer of left kidney ([place]) #Kidney cancer-clear cell carcinoma.  Stage III, pT3N0- Currently receiving adjuvant Sutent 50 mg, 2 weeks on, 1 week off.  Tolerance significant improved on current cycle.  # Currently on fouth cycle; labs  adequate.  Continue Sutent for total of 1 year.  # Hypertension-from Sutent- STABLE.  Currently systolic Q000111Q.  Recommend keeping a log of blood pressure at home.   # Hypothyroidism-  on synthroid 100 mcg/d; NOV 2020-TSH-Normal.   # Anemia/Thrombocytopenia- from sutent-improved; on PO iron.   # Disposition:  # 4 weeks- MD-VIDEO; labs- cbc/cmp- Dr.B  Follow-up instructions:  I discussed the assessment and treatment plan with the patient.  The patient was provided an opportunity to ask questions and all were answered.  The patient agreed with the plan and demonstrated understanding of instructions.  The patient was advised to call back or seek an in person evaluation if the symptoms worsen or if the condition fails to improve as anticipated.  Dr. Charlaine Dalton [place] at [facility] 07/03/2019 2:43 PM

## 2019-07-03 NOTE — Assessment & Plan Note (Addendum)
#  Kidney cancer-clear cell carcinoma.  Stage III, pT3N0- Currently receiving adjuvant Sutent 50 mg, 2 weeks on, 1 week off.  Tolerance significant improved on current cycle.  # Currently on fouth cycle; labs adequate.  Continue Sutent for total of 1 year.  # Hypertension-from Sutent- STABLE.  Currently systolic Q000111Q.  Recommend keeping a log of blood pressure at home.   # Hypothyroidism-  on synthroid 100 mcg/d; NOV 2020-TSH-Normal.   # Anemia/Thrombocytopenia- from sutent-improved; on PO iron.   # Disposition:  # 4 weeks- MD-VIDEO; labs- cbc/cmp- Dr.B

## 2019-07-08 ENCOUNTER — Ambulatory Visit: Payer: HRSA Program | Attending: Internal Medicine

## 2019-07-08 DIAGNOSIS — Z20822 Contact with and (suspected) exposure to covid-19: Secondary | ICD-10-CM

## 2019-07-08 DIAGNOSIS — Z20828 Contact with and (suspected) exposure to other viral communicable diseases: Secondary | ICD-10-CM | POA: Insufficient documentation

## 2019-07-10 LAB — NOVEL CORONAVIRUS, NAA: SARS-CoV-2, NAA: NOT DETECTED

## 2019-07-24 ENCOUNTER — Encounter: Payer: Self-pay | Admitting: Internal Medicine

## 2019-07-31 ENCOUNTER — Inpatient Hospital Stay (HOSPITAL_BASED_OUTPATIENT_CLINIC_OR_DEPARTMENT_OTHER): Payer: Self-pay | Admitting: Internal Medicine

## 2019-07-31 ENCOUNTER — Other Ambulatory Visit: Payer: Self-pay

## 2019-07-31 ENCOUNTER — Inpatient Hospital Stay: Payer: Self-pay | Attending: Internal Medicine

## 2019-07-31 DIAGNOSIS — I10 Essential (primary) hypertension: Secondary | ICD-10-CM | POA: Insufficient documentation

## 2019-07-31 DIAGNOSIS — D696 Thrombocytopenia, unspecified: Secondary | ICD-10-CM | POA: Insufficient documentation

## 2019-07-31 DIAGNOSIS — E119 Type 2 diabetes mellitus without complications: Secondary | ICD-10-CM | POA: Insufficient documentation

## 2019-07-31 DIAGNOSIS — Z905 Acquired absence of kidney: Secondary | ICD-10-CM | POA: Insufficient documentation

## 2019-07-31 DIAGNOSIS — E039 Hypothyroidism, unspecified: Secondary | ICD-10-CM | POA: Insufficient documentation

## 2019-07-31 DIAGNOSIS — D649 Anemia, unspecified: Secondary | ICD-10-CM | POA: Insufficient documentation

## 2019-07-31 DIAGNOSIS — C642 Malignant neoplasm of left kidney, except renal pelvis: Secondary | ICD-10-CM

## 2019-07-31 LAB — CBC WITH DIFFERENTIAL/PLATELET
Abs Immature Granulocytes: 0.06 10*3/uL (ref 0.00–0.07)
Basophils Absolute: 0 10*3/uL (ref 0.0–0.1)
Basophils Relative: 0 %
Eosinophils Absolute: 0.3 10*3/uL (ref 0.0–0.5)
Eosinophils Relative: 5 %
HCT: 37 % — ABNORMAL LOW (ref 39.0–52.0)
Hemoglobin: 11.6 g/dL — ABNORMAL LOW (ref 13.0–17.0)
Immature Granulocytes: 1 %
Lymphocytes Relative: 30 %
Lymphs Abs: 1.8 10*3/uL (ref 0.7–4.0)
MCH: 27.6 pg (ref 26.0–34.0)
MCHC: 31.4 g/dL (ref 30.0–36.0)
MCV: 88.1 fL (ref 80.0–100.0)
Monocytes Absolute: 0.9 10*3/uL (ref 0.1–1.0)
Monocytes Relative: 15 %
Neutro Abs: 3 10*3/uL (ref 1.7–7.7)
Neutrophils Relative %: 49 %
Platelets: 190 10*3/uL (ref 150–400)
RBC: 4.2 MIL/uL — ABNORMAL LOW (ref 4.22–5.81)
RDW: 17.5 % — ABNORMAL HIGH (ref 11.5–15.5)
WBC: 6.2 10*3/uL (ref 4.0–10.5)
nRBC: 0 % (ref 0.0–0.2)

## 2019-07-31 LAB — COMPREHENSIVE METABOLIC PANEL
ALT: 37 U/L (ref 0–44)
AST: 30 U/L (ref 15–41)
Albumin: 4.1 g/dL (ref 3.5–5.0)
Alkaline Phosphatase: 61 U/L (ref 38–126)
Anion gap: 10 (ref 5–15)
BUN: 21 mg/dL — ABNORMAL HIGH (ref 6–20)
CO2: 24 mmol/L (ref 22–32)
Calcium: 9.2 mg/dL (ref 8.9–10.3)
Chloride: 102 mmol/L (ref 98–111)
Creatinine, Ser: 0.97 mg/dL (ref 0.61–1.24)
GFR calc Af Amer: >60 mL/min (ref >60)
GFR calc non Af Amer: >60 mL/min (ref >60)
Glucose, Bld: 123 mg/dL — ABNORMAL HIGH (ref 70–99)
Potassium: 3.6 mmol/L (ref 3.5–5.1)
Sodium: 136 mmol/L (ref 135–145)
Total Bilirubin: 0.6 mg/dL (ref 0.3–1.2)
Total Protein: 7.8 g/dL (ref 6.5–8.1)

## 2019-07-31 NOTE — Assessment & Plan Note (Signed)
#  Kidney cancer-clear cell carcinoma.  Stage III, pT3N0- Currently receiving adjuvant Sutent 50 mg, 2 weeks on, 1 week off.  Tolerance significant improved on current cycle.  # Currently on fouth cycle; labs adequate.  Continue Sutent for total of 1 year.  # Hypertension-from Sutent- Currently systolic Q000111Q- STABLE.  # Hypothyroidism-  on synthroid 100 mcg/d; NOV 2020-TSH-Normal.   # Anemia/Thrombocytopenia-STABLE- from sutent-on PO iron.   # # I discussed regarding Covid-19 precautions.  I reviewed the vaccine effectiveness and potential side effects in detail.  Also discussed long-term effectiveness and safety profile are unclear at this time.  I discussed December, 2020 ASCO position statement-that all patients are recommended COVID-19 vaccinations [when available]-as long as they do not have allergy to components of the vaccine.  However, I think the benefits of the vaccination outweigh the potential risks.   # Disposition:  # 4 weeks- MD-mychart VIDEO; labs- cbc/cmp- Dr.B

## 2019-07-31 NOTE — Progress Notes (Signed)
I connected with Charles Mathis on 07/31/19 at  1:00 PM EST by video enabled telemedicine visit and verified that I am speaking with the correct person using two identifiers.  I discussed the limitations, risks, security and privacy concerns of performing an evaluation and management service by telemedicine and the availability of in-person appointments. I also discussed with the patient that there may be a patient responsible charge related to this service. The patient expressed understanding and agreed to proceed.    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 Sniskinski]; July CT 2020- mid anterior left kidney measuring 8.6 x 8.1 x 9.4 cm; CT chest negative  #April 06, 2019-Sutent 50 mg 2 weeks on 1 week off.   # DM- 2- OHA/ HTN  DIAGNOSIS: Left kidney cancer  STAGE: 3       ;GOALS: Cure  CURRENT/MOST RECENT THERAPY: Sutent    Cancer of left kidney ([employer])  02/26/2019 Initial Diagnosis   Cancer of left kidney [facility])      Chief Complaint: Kidney cancer   History of present illness:Charles Mathis 49 y.o.  male with history of kidney cancer-stage III currently on adjuvant Sutent  Patient denies any worsening fatigue.  Denies any headaches.  No bone pain.  No nausea no vomiting.  Denies any chills.  Appetite is good.  No weight loss   Observation/objective:  Assessment and plan: Cancer of left kidney ([facility]) #Kidney cancer-clear cell carcinoma.  Stage III, pT3N0- Currently receiving adjuvant Sutent 50 mg, 2 weeks on, 1 week off.  Tolerance significant improved on current cycle.  # Currently on fouth cycle; labs adequate.  Continue Sutent for total of 1 year.  # Hypertension-from Sutent- Currently systolic Q000111Q-  STABLE.  # Hypothyroidism-  on synthroid 100 mcg/d; NOV 2020-TSH-Normal.   # Anemia/Thrombocytopenia-STABLE- from sutent-on PO iron.   # # I discussed regarding Covid-19 precautions.  I reviewed the vaccine effectiveness and potential side effects in detail.  Also discussed long-term effectiveness and safety profile are unclear at this time.  I discussed December, 2020 ASCO position statement-that all patients are recommended COVID-19 vaccinations [when available]-as long as they do not have allergy to components of the vaccine.  However, I think the benefits of the vaccination outweigh the potential risks.   # Disposition:  # 4 weeks- MD-mychart VIDEO; labs- cbc/cmp- Dr.B  Follow-up instructions:  I discussed the assessment and treatment plan with the patient.  The patient was provided an opportunity to ask questions and all were answered.  The patient agreed with the plan and demonstrated understanding of instructions.  The patient was advised to call back or seek an in person evaluation if the symptoms worsen or if the condition fails to improve as anticipated.  Dr. Charlaine Dalton [place] at [facility] 07/31/2019 1:13 PM

## 2019-08-27 ENCOUNTER — Ambulatory Visit: Payer: Self-pay | Admitting: Urology

## 2019-08-28 ENCOUNTER — Other Ambulatory Visit: Payer: Self-pay

## 2019-08-28 ENCOUNTER — Inpatient Hospital Stay: Payer: Self-pay | Attending: Internal Medicine

## 2019-08-28 ENCOUNTER — Inpatient Hospital Stay (HOSPITAL_BASED_OUTPATIENT_CLINIC_OR_DEPARTMENT_OTHER): Payer: Self-pay | Admitting: Internal Medicine

## 2019-08-28 ENCOUNTER — Other Ambulatory Visit: Payer: Self-pay | Admitting: *Deleted

## 2019-08-28 DIAGNOSIS — D649 Anemia, unspecified: Secondary | ICD-10-CM | POA: Insufficient documentation

## 2019-08-28 DIAGNOSIS — D696 Thrombocytopenia, unspecified: Secondary | ICD-10-CM | POA: Insufficient documentation

## 2019-08-28 DIAGNOSIS — E119 Type 2 diabetes mellitus without complications: Secondary | ICD-10-CM | POA: Insufficient documentation

## 2019-08-28 DIAGNOSIS — C642 Malignant neoplasm of left kidney, except renal pelvis: Secondary | ICD-10-CM

## 2019-08-28 DIAGNOSIS — R0981 Nasal congestion: Secondary | ICD-10-CM | POA: Insufficient documentation

## 2019-08-28 DIAGNOSIS — E039 Hypothyroidism, unspecified: Secondary | ICD-10-CM | POA: Insufficient documentation

## 2019-08-28 DIAGNOSIS — Z79899 Other long term (current) drug therapy: Secondary | ICD-10-CM | POA: Insufficient documentation

## 2019-08-28 DIAGNOSIS — I10 Essential (primary) hypertension: Secondary | ICD-10-CM | POA: Insufficient documentation

## 2019-08-28 LAB — COMPREHENSIVE METABOLIC PANEL
ALT: 67 U/L — ABNORMAL HIGH (ref 0–44)
AST: 57 U/L — ABNORMAL HIGH (ref 15–41)
Albumin: 4.3 g/dL (ref 3.5–5.0)
Alkaline Phosphatase: 59 U/L (ref 38–126)
Anion gap: 12 (ref 5–15)
BUN: 19 mg/dL (ref 6–20)
CO2: 25 mmol/L (ref 22–32)
Calcium: 9.2 mg/dL (ref 8.9–10.3)
Chloride: 98 mmol/L (ref 98–111)
Creatinine, Ser: 1.17 mg/dL (ref 0.61–1.24)
GFR calc Af Amer: >60 mL/min (ref >60)
GFR calc non Af Amer: >60 mL/min (ref >60)
Glucose, Bld: 120 mg/dL — ABNORMAL HIGH (ref 70–99)
Potassium: 3.5 mmol/L (ref 3.5–5.1)
Sodium: 135 mmol/L (ref 135–145)
Total Bilirubin: 0.7 mg/dL (ref 0.3–1.2)
Total Protein: 7.9 g/dL (ref 6.5–8.1)

## 2019-08-28 LAB — CBC WITH DIFFERENTIAL/PLATELET
Abs Immature Granulocytes: 0.02 10*3/uL (ref 0.00–0.07)
Basophils Absolute: 0 10*3/uL (ref 0.0–0.1)
Basophils Relative: 0 %
Eosinophils Absolute: 0.4 10*3/uL (ref 0.0–0.5)
Eosinophils Relative: 11 %
HCT: 37.5 % — ABNORMAL LOW (ref 39.0–52.0)
Hemoglobin: 12.4 g/dL — ABNORMAL LOW (ref 13.0–17.0)
Immature Granulocytes: 1 %
Lymphocytes Relative: 36 %
Lymphs Abs: 1.4 10*3/uL (ref 0.7–4.0)
MCH: 28.7 pg (ref 26.0–34.0)
MCHC: 33.1 g/dL (ref 30.0–36.0)
MCV: 86.8 fL (ref 80.0–100.0)
Monocytes Absolute: 0.4 10*3/uL (ref 0.1–1.0)
Monocytes Relative: 11 %
Neutro Abs: 1.6 10*3/uL — ABNORMAL LOW (ref 1.7–7.7)
Neutrophils Relative %: 41 %
Platelets: 172 10*3/uL (ref 150–400)
RBC: 4.32 MIL/uL (ref 4.22–5.81)
RDW: 15.3 % (ref 11.5–15.5)
WBC: 3.9 10*3/uL — ABNORMAL LOW (ref 4.0–10.5)
nRBC: 0 % (ref 0.0–0.2)

## 2019-08-28 MED ORDER — MONTELUKAST SODIUM 10 MG PO TABS: ORAL_TABLET | ORAL | 3 refills | Status: DC

## 2019-08-28 NOTE — Assessment & Plan Note (Addendum)
#  Kidney cancer-clear cell carcinoma.  Stage III, pT3N0- Currently receiving adjuvant Sutent 50 mg, 2 weeks on, 1 week off.  Tolerance significant improved on current cycle.  # Currently on fifth cycle; labs adequate.  Continue Sutent for total of 1 year.  # Hypertension-from Sutent- Currently systolic 123456; hold off sudafed.  Recommend checking blood pressure closely.  # Allergies-send any prescription for Singulair.  # Hypothyroidism-  on synthroid 100 mcg/d; NOV 2020-TSH-Normal.  Stable  # Anemia/Thrombocytopenia-stable ; stop Iron.   # Disposition:  # 4 weeks- MD-mychart VIDEO; labs- cbc/cmp- Dr.B

## 2019-08-31 ENCOUNTER — Ambulatory Visit: Payer: Self-pay

## 2019-08-31 NOTE — Progress Notes (Signed)
I connected with Charles Mathis on 08/28/2019 at  2:45 PM EST by video enabled telemedicine visit and verified that I am speaking with the correct person using two identifiers.  I discussed the limitations, risks, security and privacy concerns of performing an evaluation and management service by telemedicine and the availability of in-person appointments. I also discussed with the patient that there may be a patient responsible charge related to this service. The patient expressed understanding and agreed to proceed.    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 Sniskinski]; July CT 2020- mid anterior left kidney measuring 8.6 x 8.1 x 9.4 cm; CT chest negative  #April 06, 2019-Sutent 50 mg 2 weeks on 1 week off.   # DM- 2- OHA/ HTN  DIAGNOSIS: Left kidney cancer  STAGE: 3       ;GOALS: Cure  CURRENT/MOST RECENT THERAPY: Sutent    Cancer of left kidney ([place])  02/26/2019 Initial Diagnosis   Cancer of left kidney [facility])      Chief Complaint: Kidney cancer   History of present illness:Charles Mathis 49 y.o.  male with history of clear cell carcinoma of the kidney stage III currently on adjuvant sunitinib is here for follow-up.  Patient states his blood pressures have been elevated 150s to 123456 with diastolic [id number].  Denies any headaches.  Denies any unusual fatigue.  Patient has been taking Sudafed for his allergies; stuffy nose.  Also on Zyrtec.  Denies any easy bruising or blood in stools or black-colored stools.  Observation/objective: Hemoglobin 12.4 platelets 172.  Assessment and plan: Cancer of left kidney ([place]) #Kidney cancer-clear cell carcinoma.  Stage III, pT3N0- Currently receiving adjuvant Sutent 50 mg, 2  weeks on, 1 week off.  Tolerance significant improved on current cycle.  # Currently on fifth cycle; labs adequate.  Continue Sutent for total of 1 year.  # Hypertension-from Sutent- Currently systolic 123456; hold off sudafed.  Recommend checking blood pressure closely.  # Allergies-send any prescription for Singulair.  # Hypothyroidism-  on synthroid 100 mcg/d; NOV 2020-TSH-Normal.  Stable  # Anemia/Thrombocytopenia-stable ; stop Iron.   # Disposition:  # 4 weeks- MD-mychart VIDEO; labs- cbc/cmp- Dr.B  Follow-up instructions:  I discussed the assessment and treatment plan with the patient.  The patient was provided an opportunity to ask questions and all were answered.  The patient agreed with the plan and demonstrated understanding of instructions.  The patient was advised to call back or seek an in person evaluation if the symptoms worsen or if the condition fails to improve as anticipated.  Dr. Charlaine Dalton [place] at [facility] 08/31/2019 8:17 AM

## 2019-09-02 ENCOUNTER — Ambulatory Visit: Payer: Self-pay | Admitting: Urology

## 2019-09-04 ENCOUNTER — Telehealth: Payer: Self-pay

## 2019-09-04 ENCOUNTER — Ambulatory Visit
Admission: RE | Admit: 2019-09-04 | Discharge: 2019-09-04 | Disposition: A | Discharge status: Home / Self Care | Payer: Self-pay | Source: Ambulatory Visit | Attending: Urology | Admitting: Urology

## 2019-09-04 ENCOUNTER — Other Ambulatory Visit: Payer: Self-pay

## 2019-09-04 DIAGNOSIS — C642 Malignant neoplasm of left kidney, except renal pelvis: Secondary | ICD-10-CM | POA: Insufficient documentation

## 2019-09-04 MED ORDER — IOHEXOL 300 MG/ML  SOLN
100.0000 mL | Freq: Once | INTRAMUSCULAR | Status: AC | PRN
  Administered 2019-09-04: 08:00:00 100 mL via INTRAVENOUS

## 2019-09-04 NOTE — Telephone Encounter (Signed)
-----   Message from Billey Co, MD sent at 09/04/2019 11:27 AM EST ----- Doristine Devoid news, no kidney cancer recurrence on recent CT! Keep follow up next week to discuss further  Nickolas Madrid, MD 09/04/2019

## 2019-09-04 NOTE — Telephone Encounter (Signed)
Called pt informed him of the information below. Pt gave verbal understanding.  

## 2019-09-08 ENCOUNTER — Other Ambulatory Visit: Payer: Self-pay | Admitting: Family Medicine

## 2019-09-08 DIAGNOSIS — I10 Essential (primary) hypertension: Secondary | ICD-10-CM

## 2019-09-08 MED ORDER — CHLORTHALIDONE 25 MG PO TABS: 12.5000 mg | ORAL_TABLET | Freq: Every day | ORAL | 0 refills | Status: DC

## 2019-09-08 MED ORDER — POTASSIUM CHLORIDE CRYS ER 10 MEQ PO TBCR: 10.0000 meq | EXTENDED_RELEASE_TABLET | Freq: Every day | ORAL | 0 refills | Status: DC

## 2019-09-09 ENCOUNTER — Ambulatory Visit (INDEPENDENT_AMBULATORY_CARE_PROVIDER_SITE_OTHER): Payer: Self-pay | Admitting: Urology

## 2019-09-09 ENCOUNTER — Encounter: Payer: Self-pay | Admitting: Urology

## 2019-09-09 ENCOUNTER — Other Ambulatory Visit: Payer: Self-pay

## 2019-09-09 VITALS — BP 139/81 | HR 73 | Ht 67.0 in | Wt 249.5 lb

## 2019-09-09 DIAGNOSIS — C642 Malignant neoplasm of left kidney, except renal pelvis: Secondary | ICD-10-CM

## 2019-09-09 NOTE — Patient Instructions (Signed)
Kidney Cancer  Kidney cancer is an abnormal growth of cells in one or both kidneys. The kidneys filter waste from your blood and produce urine. Kidney cancer may spread to other parts of your body. This type of cancer may also be called renal cell carcinoma. What are the causes? The cause of this condition is not always known. In some cases, abnormal changes to genes (genetic mutations) can cause cells to form cancer. What increases the risk? You may be more likely to develop kidney cancer if you:  Are over age 60. The risk increases with age.  Have a family history of kidney cancer.  Are of African-American, Native American, or Native [place] descent.  Smoke.  Are male.  Are obese.  Have high blood pressure (hypertension).  Have advanced kidney disease, especially if you need long-term dialysis.  Have certain conditions that are passed from parent to child (inherited), such as von Hippel-Lindau disease, tuberous sclerosis, or hereditary papillary renal carcinoma.  Have been exposed to certain chemicals. What are the signs or symptoms? In the early stages, kidney cancer does not cause symptoms. As the cancer grows, symptoms may include:  Blood in the urine.  Pain in the upper back or abdomen, just below the rib cage. You may feel pain on one or both sides of the body.  Fatigue.  Unexplained weight loss.  Fever. How is this diagnosed? This condition may be diagnosed based on:  Your symptoms and medical history.  A physical exam.  Blood and urine tests.  X-rays.  Imaging tests, such as CT scans, MRIs, and PET scans.  Having dye injected into your blood through an IV, and then having X-rays taken of: ? Your kidneys and the rest of the organs involved in making and storing urine (intravenous pyelogram). ? Your blood vessels (angiogram).  Removal and testing of a kidney tissue sample (biopsy). Your cancer will be assessed (staged), based on how severe it is and how  much it has spread. How is this treated? Treatment depends on the type and stage of the cancer. Treatment may include one or more of the following:  Surgery. This may include surgery to remove: ? Just the tumor (nephron-sparing surgery). ? The entire kidney (nephrectomy). ? The kidney, some of the surrounding healthy tissue, nearby lymph nodes, and the adrenal gland in certain cases (radical nephrectomy).  Medicines that kill cancer cells (chemotherapy).  High-energy rays that kill cancer cells (radiation therapy).  Targeted therapy. This targets specific parts of cancer cells and the area around them to block the growth and the spread of the cancer. Targeted therapy can help to limit the damage to healthy cells.  Medicines that help your body's disease-fighting system (immune system) fight cancer cells (immunotherapy).  Freezing cancer cells using gas or liquid that is delivered through a needle (cryoablation).  Destroying cancer cells using high-energy radio waves that are delivered through a needle-like probe (radiofrequency ablation).  A procedure to block the artery that supplies blood to the tumor, which kills the cancer cells (embolization). Follow these instructions at home: Eating and drinking  Some of your treatments might affect your appetite and your ability to chew and swallow. If you are having problems eating, or if you do not have an appetite, meet with a diet and nutrition specialist (dietitian).  If you have side effects that affect eating, it may help to: ? Eat smaller meals and snacks often. ? Drink high-nutrition and high-calorie shakes or supplements. ? Eat bland and soft   foods that are easy to eat. ? Not eat foods that are hot, spicy, or hard to swallow. Lifestyle  Do not drink alcohol.  Do not use any products that contain nicotine or tobacco, such as cigarettes and e-cigarettes. If you need help quitting, ask your health care provider. General  instructions   Take over-the-counter and prescription medicines only as told by your health care provider. This includes vitamins, supplements, and herbal products.  Consider joining a support group to help you cope with the stress of having kidney cancer.  Work with your health care provider to manage any side effects of treatment.  Keep all follow-up visits as told by your health care provider. This is important. Where to find more information  American Cancer Society: https://www.cancer.org  National Cancer Institute (NCI): https://www.cancer.gov Contact a health care provider if you:  Notice that you bruise or bleed easily.  Are losing weight without trying.  Have new or increased fatigue or weakness. Get help right away if you have:  Blood in your urine.  A sudden increase in pain.  A fever.  Shortness of breath.  Chest pain.  Yellow skin or whites of your eyes (jaundice). Summary  Kidney cancer is an abnormal growth of cells (tumor) in one or both kidneys. Tumors may spread to other parts of your body.  In the early stages, kidney cancer does not cause symptoms. As the cancer grows, symptoms may include blood in the urine, pain in the upper back or abdomen, unexplained weight loss, fatigue, and fever.  Treatment depends on the type and stage of the cancer. It may include surgery to remove the tumor, procedures and medicines to kill the cancer cells, or medicines to help your body fight cancer cells. This information is not intended to replace advice given to you by your health care provider. Make sure you discuss any questions you have with your health care provider. Document Revised: 07/24/2017 Document Reviewed: 07/21/2017 Elsevier Patient Education  2020 Elsevier Inc.  

## 2019-09-09 NOTE — Progress Notes (Signed)
   09/09/2019 3:34 PM   Charles Mathis 21-Nov-1970 [id number]  Reason for visit: Follow up kidney cancer  HPI: I saw Mr. Charles Mathis back in urology clinic today for follow-up of kidney cancer.  He is a healthy 49 year old male that presented to the ED on 02/05/2019 with gross hematuria, flank pain, clot retention, and an enhancing left 10 cm renal mass.  He underwent uncomplicated left laparoscopic hand-assisted radical nephrectomy on 02/06/2019, as well as cystoscopy that showed no concerning bladder lesions.   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  He is currently getting 1 year of adjuvant sunitinib with medical oncology.  He is tolerating this relatively well.  He initially had some fatigue and change in taste, but the symptoms have resolved.  He denies any flank pain, gross hematuria, or weight loss.  I personally reviewed his recent imaging with CT of the chest, abdomen, and pelvis on 09/04/2019, there is no evidence of recurrence or metastatic disease.  Most recent renal function is normal with creatinine 1.17, EGFR greater than 60.  We had a long conversation about his history of RCC and high risk for recurrence.  We discussed approximately 20 to 30% recurrence rate for patients with high risk stage III kidney cancer.  We will continue imaging every 6 months with CT chest, abdomen, pelvis for the first 3 years, then space to annual imaging to year 5.  He continues to get lab work and close follow-up with oncology regarding his adjuvant sunitinib.  RTC 6 months with CT chest, abdomen, pelvis Continue following with oncology for adjuvant sunitinib, labs  I spent 30 total minutes on the day of the encounter including pre-visit review of the medical record, face-to-face time with the patient, and post visit ordering of labs/imaging/tests.  Billey Co, [facility] [address], [place],  [address] [phone]

## 2019-09-10 ENCOUNTER — Ambulatory Visit: Payer: Self-pay | Admitting: Family Medicine

## 2019-09-25 ENCOUNTER — Inpatient Hospital Stay: Payer: Self-pay | Attending: Internal Medicine

## 2019-09-25 ENCOUNTER — Other Ambulatory Visit: Payer: Self-pay

## 2019-09-25 ENCOUNTER — Encounter: Payer: Self-pay | Admitting: Family Medicine

## 2019-09-25 ENCOUNTER — Inpatient Hospital Stay: Payer: Self-pay | Admitting: Internal Medicine

## 2019-09-25 ENCOUNTER — Ambulatory Visit (INDEPENDENT_AMBULATORY_CARE_PROVIDER_SITE_OTHER): Payer: Self-pay | Admitting: Family Medicine

## 2019-09-25 VITALS — BP 128/75 | HR 76 | Temp 98.6°F | Ht 67.0 in | Wt 244.2 lb

## 2019-09-25 DIAGNOSIS — Z79899 Other long term (current) drug therapy: Secondary | ICD-10-CM | POA: Insufficient documentation

## 2019-09-25 DIAGNOSIS — E1165 Type 2 diabetes mellitus with hyperglycemia: Secondary | ICD-10-CM

## 2019-09-25 DIAGNOSIS — E039 Hypothyroidism, unspecified: Secondary | ICD-10-CM

## 2019-09-25 DIAGNOSIS — C642 Malignant neoplasm of left kidney, except renal pelvis: Secondary | ICD-10-CM | POA: Insufficient documentation

## 2019-09-25 DIAGNOSIS — I10 Essential (primary) hypertension: Secondary | ICD-10-CM | POA: Insufficient documentation

## 2019-09-25 DIAGNOSIS — R7989 Other specified abnormal findings of blood chemistry: Secondary | ICD-10-CM | POA: Insufficient documentation

## 2019-09-25 DIAGNOSIS — E119 Type 2 diabetes mellitus without complications: Secondary | ICD-10-CM | POA: Insufficient documentation

## 2019-09-25 DIAGNOSIS — E785 Hyperlipidemia, unspecified: Secondary | ICD-10-CM

## 2019-09-25 LAB — POCT GLYCOSYLATED HEMOGLOBIN (HGB A1C): Hemoglobin A1C: 6.1 % — AB (ref 4.0–5.6)

## 2019-09-25 LAB — POCT URINALYSIS DIPSTICK
Bilirubin, UA: NEGATIVE
Glucose, UA: NEGATIVE
Ketones, UA: NEGATIVE
Leukocytes, UA: NEGATIVE
Nitrite, UA: NEGATIVE
Protein, UA: POSITIVE — AB
Spec Grav, UA: <=1.005 — AB (ref 1.010–1.025)
Urobilinogen, UA: 1 E.U./dL
pH, UA: 8.5 — AB (ref 5.0–8.0)

## 2019-09-25 LAB — POCT UA - MICROALBUMIN: Microalbumin Ur, POC: NEGATIVE mg/L

## 2019-09-25 LAB — COMPREHENSIVE METABOLIC PANEL
ALT: 81 U/L — ABNORMAL HIGH (ref 0–44)
AST: 76 U/L — ABNORMAL HIGH (ref 15–41)
Albumin: 4.4 g/dL (ref 3.5–5.0)
Alkaline Phosphatase: 64 U/L (ref 38–126)
Anion gap: 14 (ref 5–15)
BUN: 19 mg/dL (ref 6–20)
CO2: 25 mmol/L (ref 22–32)
Calcium: 9.3 mg/dL (ref 8.9–10.3)
Chloride: 98 mmol/L (ref 98–111)
Creatinine, Ser: 1.29 mg/dL — ABNORMAL HIGH (ref 0.61–1.24)
GFR calc Af Amer: >60 mL/min (ref >60)
GFR calc non Af Amer: >60 mL/min (ref >60)
Glucose, Bld: 117 mg/dL — ABNORMAL HIGH (ref 70–99)
Potassium: 3.4 mmol/L — ABNORMAL LOW (ref 3.5–5.1)
Sodium: 137 mmol/L (ref 135–145)
Total Bilirubin: 1 mg/dL (ref 0.3–1.2)
Total Protein: 8 g/dL (ref 6.5–8.1)

## 2019-09-25 LAB — CBC WITH DIFFERENTIAL/PLATELET
Abs Immature Granulocytes: 0.01 10*3/uL (ref 0.00–0.07)
Basophils Absolute: 0 10*3/uL (ref 0.0–0.1)
Basophils Relative: 0 %
Eosinophils Absolute: 0.3 10*3/uL (ref 0.0–0.5)
Eosinophils Relative: 7 %
HCT: 40.3 % (ref 39.0–52.0)
Hemoglobin: 13.7 g/dL (ref 13.0–17.0)
Immature Granulocytes: 0 %
Lymphocytes Relative: 36 %
Lymphs Abs: 1.7 10*3/uL (ref 0.7–4.0)
MCH: 29.1 pg (ref 26.0–34.0)
MCHC: 34 g/dL (ref 30.0–36.0)
MCV: 85.7 fL (ref 80.0–100.0)
Monocytes Absolute: 0.4 10*3/uL (ref 0.1–1.0)
Monocytes Relative: 8 %
Neutro Abs: 2.2 10*3/uL (ref 1.7–7.7)
Neutrophils Relative %: 49 %
Platelets: 192 10*3/uL (ref 150–400)
RBC: 4.7 MIL/uL (ref 4.22–5.81)
RDW: 14.8 % (ref 11.5–15.5)
WBC: 4.6 10*3/uL (ref 4.0–10.5)
nRBC: 0 % (ref 0.0–0.2)

## 2019-09-25 NOTE — Assessment & Plan Note (Signed)
Controlled on last visit 05/2019.  Currently taking Levothyroxine 174mcg daily.  Does not report any current symptoms but continues to be obese with difficulty to lose weight.  Plan: 1) Have thyroid labs checked and will discuss treatment modifications based on thyroid function results 2) Follow up in 6 months

## 2019-09-25 NOTE — Patient Instructions (Signed)
As we discussed, your A1C today was 6.1% - GREAT WORK!  Plan to have your lipids checked in the next 1-2 weeks and we will be in contact to review the results with you.  Continue your current medications as directed.  Try to get exercise a minimum of 30 minutes per day at least 5 days per week as well as  adequate water intake all while measuring blood pressure a few times per week.  Keep a blood pressure log and bring back to clinic at your next visit.  If your readings are consistently over 140/90 to contact our office/send me a MyChart message and we will see you sooner.  Can try DASH and Mediterranean diet options, avoiding processed foods, lowering sodium intake, avoiding pork products, and eating a plant based diet for optimal health.  Reminded to have yearly dilated eye exams, daily feet check, and following a heart healthy diet (low fat, low salt, low carb and protein control) along with daily exercise.  Eat a plant based diet for optimal health.  Advised to avoid juices, sodas, rice, pasta, potatoes and bread and to get at least 30 minutes of exercise 5x a week.    We will plan to see you back in 6 months for recheck on diabetes, hypertension and hyperlipidemia.  You will receive a survey after today's visit either digitally by e-mail or paper by [employer]. Your experiences and feedback matter to [place].  Please respond so we know how we are doing as we provide care for you.  Call us with any questions/concerns/needs.  It is my goal to be available to you for your health concerns.  Thanks for choosing me to be a partner in your healthcare needs!  Harlin Rain, FNP-C Family Nurse Practitioner [facility] Phone: [phone]

## 2019-09-25 NOTE — Progress Notes (Unsigned)
I connected with Charles Mathis on 09/25/19 at  2:45 PM EST by {Blank single:19197::"video enabled telemedicine visit","telephone visit"} and verified that I am speaking with the correct person using two identifiers.  I discussed the limitations, risks, security and privacy concerns of performing an evaluation and management service by telemedicine and the availability of in-person appointments. I also discussed with the patient that there may be a patient responsible charge related to this service. The patient expressed understanding and agreed to proceed.    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 Sniskinski]; July CT 2020- mid anterior left kidney measuring 8.6 x 8.1 x 9.4 cm; CT chest negative  #April 06, 2019-Sutent 50 mg 2 weeks on 1 week off.   # DM- 2- OHA/ HTN  DIAGNOSIS: Left kidney cancer  STAGE: 3       ;GOALS: Cure  CURRENT/MOST RECENT THERAPY: Sutent    Cancer of left kidney ([place])  02/26/2019 Initial Diagnosis   Cancer of left kidney [facility])      Chief Complaint: ***    History of present illness:Charles Mathis 49 y.o.  male with history of   Observation/objective:  Assessment and plan: No problem-specific Assessment & Plan notes found for this encounter.    Follow-up instructions:  I discussed the assessment and treatment plan with the patient.  The patient was provided an opportunity to ask questions and all were answered.  The patient agreed with the plan and demonstrated understanding of instructions.  The patient was advised to call back or seek an in person evaluation if the symptoms worsen or if the condition fails to improve as anticipated.  I provided *** minutes of {Blank  single:19197::"face-to-face video visit time","non face-to-face telephone visit time"} during this encounter, and > 50% was spent counseling as documented under my assessment & plan.   Dr. Charlaine Dalton [place] at [facility] 09/25/2019 1:49 PM

## 2019-09-25 NOTE — Assessment & Plan Note (Signed)
Cholesterol labs last completed 01/2019 with elevated triglycerides.  Continues to take atorvastatin 40mg  daily without side effects.  Will repeat labs and discuss titration down of medications if similar results.  Follow up in 6 months for next re-evaluation.

## 2019-09-25 NOTE — Assessment & Plan Note (Signed)
#  Kidney cancer-clear cell carcinoma.  Stage III, pT3N0- Currently receiving adjuvant Sutent 50 mg, 2 weeks on, 1 week off.  Tolerance significant improved on current cycle.  # Currently on fifth cycle; labs adequate.  Continue Sutent for total of 1 year.  # Hypertension-from Sutent- Currently systolic 123456; hold off sudafed.  Recommend checking blood pressure closely.  # Allergies-send any prescription for Singulair.  # Hypothyroidism-  on synthroid 100 mcg/d; NOV 2020-TSH-Normal.  Stable  # Anemia/Thrombocytopenia-stable ; stop Iron.   # Disposition:  # 4 weeks- MD-mychart VIDEO; labs- cbc/cmp- Dr.B

## 2019-09-25 NOTE — Assessment & Plan Note (Signed)
Stable and well controlled hypertension.  BP goal < 130/80.  Pt is working on lifestyle modifications.  Taking medications tolerating well without side effects.   Plan: 1. Continue taking amlodipine 10mg  daily, carvedilol 25mg  twice daily, chlorthalidone 12.5mg  daily, and olmesartan 40mg  daily 2. Obtain labs in next 1-2 weeks  3. Encouraged heart healthy diet and increasing exercise to 30 minutes most days of the week, going no more than 2 days in a row without exercise. 4. Check BP 1-2 x per week at home, keep log, and bring to clinic at next appointment. 5. Follow up 6 months.

## 2019-09-25 NOTE — Progress Notes (Signed)
Subjective:    Patient ID: Charles Mathis, male    DOB: [birth year], 49 y.o.   MRN: [id number]  AARIV Mathis is a 49 y.o. male presenting on 09/25/2019 for Diabetes   HPI  Pt presents today for follow up Type 2 Diabetes Mellitus.  He/she (caps): He is checking AM CBG at home but unable to recall range. -Current diabetic medications include: metformin 500mg  twice a day -ACTION; IS/IS NOT: is not currently symptomatic -Actions; denies/reports/admits to: denies polydipsia, polyphagia, polyuria, headaches, diaphoresis, shakiness, chills, pain, numbness or tingling in extremities or changes in vision -Clinical course has been improving/declining -Reports no structured exercise routine -Diet is moderate in salt, moderate in fat, and moderate in carbohydrates  PREVENTION Eye exam current (within 1 year) Is due, will be scheduling Foot exam current (within 1 year) Current Lipid/ASCVD risk reduction - on statin: YES/NO: Yes  Kidney Protection (On ACE/ARB)? YES/NO: Yes   Depression screen [facility] 2/9 05/08/2019 04/25/2017  Decreased Interest 0 0  Down, Depressed, Hopeless 0 0  PHQ - 2 Score 0 0    Social History   Tobacco Use  . Smoking status: Never Smoker  . Smokeless tobacco: Never Used  Substance Use Topics  . Alcohol use: No  . Drug use: No    Review of Systems  Constitutional: Negative.   HENT: Negative.   Eyes: Negative.   Respiratory: Negative.   Cardiovascular: Negative.   Gastrointestinal: Negative.   Endocrine: Negative.   Genitourinary: Negative.   Musculoskeletal: Negative.   Skin: Negative.   Allergic/Immunologic: Negative.   Neurological: Negative.   Hematological: Negative.   Psychiatric/Behavioral: Negative.    Per HPI unless specifically indicated above     Objective:    BP 128/75 (BP Location: Right Arm, Patient Position: Sitting, Cuff Size: Normal)   Pulse 76   Temp 98.6 F (37 C) (Temporal)   Ht 5\' 7"  (1.702 m)   Wt 244 lb 3.2 oz (110.8  kg)   BMI 38.25 kg/m   Wt Readings from Last 3 Encounters:  09/25/19 244 lb 3.2 oz (110.8 kg)  09/09/19 249 lb 8 oz (113.2 kg)  05/15/19 240 lb (108.9 kg)    Physical Exam Vitals reviewed.  Constitutional:      General: He is not in acute distress.    Appearance: Normal appearance. He is well-groomed. He is obese. He is not ill-appearing or toxic-appearing.  HENT:     Head: Normocephalic.  Eyes:     General: Lids are normal. Vision grossly intact.        Right eye: No discharge.        Left eye: No discharge.     Extraocular Movements: Extraocular movements intact.     Conjunctiva/sclera: Conjunctivae normal.     Pupils: Pupils are equal, round, and reactive to light.  Cardiovascular:     Rate and Rhythm: Normal rate and regular rhythm.     Pulses: Normal pulses.     Heart sounds: Normal heart sounds. No murmur. No friction rub. No gallop.   Pulmonary:     Effort: Pulmonary effort is normal. No respiratory distress.     Breath sounds: Normal breath sounds.  Musculoskeletal:     Right lower leg: No edema.     Left lower leg: No edema.  Skin:    General: Skin is warm and dry.     Capillary Refill: Capillary refill takes less than 2 seconds.  Neurological:     General: No focal deficit  present.     Mental Status: He is alert and oriented to person, place, and time.     Cranial Nerves: No cranial nerve deficit.     Sensory: No sensory deficit.     Motor: No weakness.     Coordination: Coordination normal.     Gait: Gait normal.  Psychiatric:        Attention and Perception: Attention and perception normal.        Mood and Affect: Mood and affect normal.        Speech: Speech normal.        Behavior: Behavior normal. Behavior is cooperative.        Thought Content: Thought content normal.        Cognition and Memory: Cognition and memory normal.     Results for orders placed or performed in visit on 09/25/19  POCT glycosylated hemoglobin (Hb A1C)  Result Value Ref  Range   Hemoglobin A1C 6.1 (A) 4.0 - 5.6 %   HbA1c POC (<> result, manual entry)     HbA1c, POC (prediabetic range)     HbA1c, POC (controlled diabetic range)    POCT UA - Microalbumin  Result Value Ref Range   Microalbumin Ur, POC negative mg/L   Creatinine, POC     Albumin/Creatinine Ratio, Urine, POC    POCT Urinalysis Dipstick  Result Value Ref Range   Color, UA yellow    Clarity, UA clear    Glucose, UA Negative Negative   Bilirubin, UA negative    Ketones, UA negative    Spec Grav, UA <=1.005 (A) 1.010 - 1.025   Blood, UA trace    pH, UA 8.5 (A) 5.0 - 8.0   Protein, UA Positive (A) Negative   Urobilinogen, UA 1.0 0.2 or 1.0 E.U./dL   Nitrite, UA negative    Leukocytes, UA Negative Negative   Appearance     Odor        Assessment & Plan:   Problem List Items Addressed This Visit      Cardiovascular and Mediastinum   Hypertension    Stable and well controlled hypertension.  BP goal < 130/80.  Pt is working on lifestyle modifications.  Taking medications tolerating well without side effects.   Plan: 1. Continue taking amlodipine 10mg  daily, carvedilol 25mg  twice daily, chlorthalidone 12.5mg  daily, and olmesartan 40mg  daily 2. Obtain labs in next 1-2 weeks  3. Encouraged heart healthy diet and increasing exercise to 30 minutes most days of the week, going no more than 2 days in a row without exercise. 4. Check BP 1-2 x per week at home, keep log, and bring to clinic at next appointment. 5. Follow up 6 months.          Endocrine   Hypothyroidism    Controlled on last visit 05/2019.  Currently taking Levothyroxine 141mcg daily.  Does not report any current symptoms but continues to be obese with difficulty to lose weight.  Plan: 1) Have thyroid labs checked and will discuss treatment modifications based on thyroid function results 2) Follow up in 6 months      Relevant Orders   Thyroid Panel With TSH   T2DM (type 2 diabetes mellitus) ([place]) - Primary     Well-controlledDM with A1c 6.1 Worsening control from 6.0 on 05/08/2019 and goal A1c < 7.0%.  Plan:  1. Continue current therapy: metformin 500mg  twice daily 2. Encourage improved lifestyle: - low carb/low glycemic diet reinforced prior education - Increase physical activity  to 30 minutes most days of the week.  Explained that increased physical activity increases body's use of sugar for energy. 3. Check fasting am CBG and record.  Bring log to next visit for review 4. Continue ARB and Statin 5. Advised to schedule an appointment for dilated eye exam with ophthalmology for yearly visit 6. Follow-up 6 months       Relevant Orders   POCT glycosylated hemoglobin (Hb A1C) (Completed)   POCT UA - Microalbumin (Completed)   POCT Urinalysis Dipstick (Completed)     Other   Dyslipidemia    Cholesterol labs last completed 01/2019 with elevated triglycerides.  Continues to take atorvastatin 40mg  daily without side effects.  Will repeat labs and discuss titration down of medications if similar results.  Follow up in 6 months for next re-evaluation.      Relevant Orders   Lipid Profile      No orders of the defined types were placed in this encounter.     Follow up plan: Return in about 6 months (around 03/27/2020) for DM, A1C, HTN, Hyperlipidemia.   Harlin Rain, [place] Family Nurse Practitioner [facility] 09/25/2019, 3:22 PM

## 2019-09-25 NOTE — Assessment & Plan Note (Signed)
Well-controlledDM with A1c 6.1 Worsening control from 6.0 on 05/08/2019 and goal A1c < 7.0%.  Plan:  1. Continue current therapy: metformin 500mg  twice daily 2. Encourage improved lifestyle: - low carb/low glycemic diet reinforced prior education - Increase physical activity to 30 minutes most days of the week.  Explained that increased physical activity increases body's use of sugar for energy. 3. Check fasting am CBG and record.  Bring log to next visit for review 4. Continue ARB and Statin 5. Advised to schedule an appointment for dilated eye exam with ophthalmology for yearly visit 6. Follow-up 6 months

## 2019-10-01 ENCOUNTER — Other Ambulatory Visit: Payer: Self-pay | Admitting: *Deleted

## 2019-10-01 ENCOUNTER — Inpatient Hospital Stay (HOSPITAL_BASED_OUTPATIENT_CLINIC_OR_DEPARTMENT_OTHER): Payer: Self-pay | Admitting: Internal Medicine

## 2019-10-01 DIAGNOSIS — C642 Malignant neoplasm of left kidney, except renal pelvis: Secondary | ICD-10-CM

## 2019-10-01 NOTE — Progress Notes (Signed)
I connected with Charles Mathis on 10/01/19 at 10:15 AM EDT by telephone visit and verified that I am speaking with the correct person using two identifiers.  Patient set up for video visit; because of technical difficulties switch over to telephone visit.  I discussed the limitations, risks, security and privacy concerns of performing an evaluation and management service by telemedicine and the availability of in-person appointments. I also discussed with the patient that there may be a patient responsible charge related to this service. The patient expressed understanding and agreed to proceed.    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 Sniskinski]; July CT 2020- mid anterior left kidney measuring 8.6 x 8.1 x 9.4 cm; CT chest negative  #April 06, 2019-Sutent 50 mg 2 weeks on 1 week off.   # DM- 2- OHA/ HTN  DIAGNOSIS: Left kidney cancer  STAGE: 3       ;GOALS: Cure  CURRENT/MOST RECENT THERAPY: Sutent    Cancer of left kidney ([place])  02/26/2019 Initial Diagnosis   Cancer of left kidney [facility])      Chief Complaint: Kidney cancer   History of present illness:Charles Mathis 49 y.o.  male with history of renal cell carcinoma stage III currently on adjuvant sunitinib is here for follow-up.  Patient denies any nausea vomiting.  Any chest pain.  Denies abdominal pain.  No itchy skin.  No rash.   Observation/objective:  Assessment and plan: Cancer of left kidney ([facility]) #Kidney cancer-clear cell carcinoma.  Stage III, pT3N0- Currently receiving adjuvant Sutent 50 mg, 2 weeks on, 1 week off.   # Currently on sixth cycle cycle; labs adequate-except for mildly elevated LFTs-see below.  # Hypertension-from Sutent-currently  120s to 130s as per patient.  Continue keep checking blood pressure at home.  # LFTs-AST ALT 70s-normal bilirubin likely secondary to Sutent.  Monitor closely.  Asymptomatic.  # Hypothyroidism-  on synthroid 100 mcg/d; NOV 2020-TSH-Normal.  Stable  # Disposition:  # 4 weeks- MD-labs- cbc/cmp- Dr.B  Follow-up instructions:  I discussed the assessment and treatment plan with the patient.  The patient was provided an opportunity to ask questions and all were answered.  The patient agreed with the plan and demonstrated understanding of instructions.  The patient was advised to call back or seek an in person evaluation if the symptoms worsen or if the condition fails to improve as anticipated.  Dr. Charlaine Dalton [facility] at [facility] 10/01/2019 10:38 AM

## 2019-10-01 NOTE — Assessment & Plan Note (Addendum)
#  Kidney cancer-clear cell carcinoma.  Stage III, pT3N0- Currently receiving adjuvant Sutent 50 mg, 2 weeks on, 1 week off.   # Currently on sixth cycle cycle; labs adequate-except for mildly elevated LFTs-see below.  # Hypertension-from Sutent-currently 120s to 130s as per patient.  Continue keep checking blood pressure at home.  # LFTs-AST ALT 70s-normal bilirubin likely secondary to Sutent.  Monitor closely.  Asymptomatic.  # Hypothyroidism-  on synthroid 100 mcg/d; NOV 2020-TSH-Normal.  Stable  # Disposition:  # 4 weeks- MD-labs- cbc/cmp- Dr.B

## 2019-10-09 ENCOUNTER — Telehealth: Payer: Self-pay | Admitting: Pharmacy Technician

## 2019-10-09 NOTE — Telephone Encounter (Signed)
Received updated proof of income.  Patient eligible to receive medication assistance at [facility] until time for re-certification in 9359, and as long as eligibility requirements continue to be met.  [place] [facility]

## 2019-10-21 ENCOUNTER — Other Ambulatory Visit: Payer: Self-pay | Admitting: Nurse Practitioner

## 2019-10-21 ENCOUNTER — Other Ambulatory Visit: Payer: Self-pay | Admitting: Family Medicine

## 2019-10-21 DIAGNOSIS — I10 Essential (primary) hypertension: Secondary | ICD-10-CM

## 2019-10-21 DIAGNOSIS — E039 Hypothyroidism, unspecified: Secondary | ICD-10-CM

## 2019-10-22 ENCOUNTER — Other Ambulatory Visit: Payer: Self-pay

## 2019-10-22 DIAGNOSIS — E1165 Type 2 diabetes mellitus with hyperglycemia: Secondary | ICD-10-CM

## 2019-10-22 DIAGNOSIS — I10 Essential (primary) hypertension: Secondary | ICD-10-CM

## 2019-10-26 MED ORDER — CARVEDILOL 25 MG PO TABS: ORAL_TABLET | ORAL | 0 refills | Status: DC

## 2019-10-26 MED ORDER — METFORMIN HCL 500 MG PO TABS: 500.0000 mg | ORAL_TABLET | Freq: Two times a day (BID) | ORAL | 0 refills | Status: DC

## 2019-10-29 ENCOUNTER — Encounter: Payer: Self-pay | Admitting: Internal Medicine

## 2019-10-29 ENCOUNTER — Other Ambulatory Visit: Payer: Self-pay

## 2019-10-29 ENCOUNTER — Ambulatory Visit: Payer: Self-pay | Admitting: Internal Medicine

## 2019-10-30 ENCOUNTER — Inpatient Hospital Stay: Payer: Self-pay | Attending: Internal Medicine

## 2019-10-30 ENCOUNTER — Other Ambulatory Visit: Payer: Self-pay | Admitting: Family Medicine

## 2019-10-30 ENCOUNTER — Inpatient Hospital Stay (HOSPITAL_BASED_OUTPATIENT_CLINIC_OR_DEPARTMENT_OTHER): Payer: Self-pay | Admitting: Internal Medicine

## 2019-10-30 DIAGNOSIS — C642 Malignant neoplasm of left kidney, except renal pelvis: Secondary | ICD-10-CM | POA: Insufficient documentation

## 2019-10-30 DIAGNOSIS — R5383 Other fatigue: Secondary | ICD-10-CM | POA: Insufficient documentation

## 2019-10-30 DIAGNOSIS — E1165 Type 2 diabetes mellitus with hyperglycemia: Secondary | ICD-10-CM

## 2019-10-30 DIAGNOSIS — Z7982 Long term (current) use of aspirin: Secondary | ICD-10-CM | POA: Insufficient documentation

## 2019-10-30 DIAGNOSIS — I10 Essential (primary) hypertension: Secondary | ICD-10-CM | POA: Insufficient documentation

## 2019-10-30 DIAGNOSIS — Z79899 Other long term (current) drug therapy: Secondary | ICD-10-CM | POA: Insufficient documentation

## 2019-10-30 DIAGNOSIS — E785 Hyperlipidemia, unspecified: Secondary | ICD-10-CM

## 2019-10-30 DIAGNOSIS — Z7984 Long term (current) use of oral hypoglycemic drugs: Secondary | ICD-10-CM | POA: Insufficient documentation

## 2019-10-30 DIAGNOSIS — R7989 Other specified abnormal findings of blood chemistry: Secondary | ICD-10-CM | POA: Insufficient documentation

## 2019-10-30 DIAGNOSIS — Z905 Acquired absence of kidney: Secondary | ICD-10-CM | POA: Insufficient documentation

## 2019-10-30 DIAGNOSIS — E039 Hypothyroidism, unspecified: Secondary | ICD-10-CM | POA: Insufficient documentation

## 2019-10-30 DIAGNOSIS — E119 Type 2 diabetes mellitus without complications: Secondary | ICD-10-CM | POA: Insufficient documentation

## 2019-10-30 LAB — COMPREHENSIVE METABOLIC PANEL
ALT: 70 U/L — ABNORMAL HIGH (ref 0–44)
AST: 59 U/L — ABNORMAL HIGH (ref 15–41)
Albumin: 4.1 g/dL (ref 3.5–5.0)
Alkaline Phosphatase: 63 U/L (ref 38–126)
Anion gap: 11 (ref 5–15)
BUN: 20 mg/dL (ref 6–20)
CO2: 26 mmol/L (ref 22–32)
Calcium: 9.2 mg/dL (ref 8.9–10.3)
Chloride: 99 mmol/L (ref 98–111)
Creatinine, Ser: 1.25 mg/dL — ABNORMAL HIGH (ref 0.61–1.24)
GFR calc Af Amer: >60 mL/min (ref >60)
GFR calc non Af Amer: >60 mL/min (ref >60)
Glucose, Bld: 119 mg/dL — ABNORMAL HIGH (ref 70–99)
Potassium: 3.4 mmol/L — ABNORMAL LOW (ref 3.5–5.1)
Sodium: 136 mmol/L (ref 135–145)
Total Bilirubin: 0.8 mg/dL (ref 0.3–1.2)
Total Protein: 8.1 g/dL (ref 6.5–8.1)

## 2019-10-30 LAB — CBC WITH DIFFERENTIAL/PLATELET
Abs Immature Granulocytes: 0.02 10*3/uL (ref 0.00–0.07)
Basophils Absolute: 0 10*3/uL (ref 0.0–0.1)
Basophils Relative: 0 %
Eosinophils Absolute: 0.4 10*3/uL (ref 0.0–0.5)
Eosinophils Relative: 8 %
HCT: 37.1 % — ABNORMAL LOW (ref 39.0–52.0)
Hemoglobin: 12.6 g/dL — ABNORMAL LOW (ref 13.0–17.0)
Immature Granulocytes: 0 %
Lymphocytes Relative: 32 %
Lymphs Abs: 1.5 10*3/uL (ref 0.7–4.0)
MCH: 29.4 pg (ref 26.0–34.0)
MCHC: 34 g/dL (ref 30.0–36.0)
MCV: 86.5 fL (ref 80.0–100.0)
Monocytes Absolute: 0.6 10*3/uL (ref 0.1–1.0)
Monocytes Relative: 13 %
Neutro Abs: 2.1 10*3/uL (ref 1.7–7.7)
Neutrophils Relative %: 47 %
Platelets: 182 10*3/uL (ref 150–400)
RBC: 4.29 MIL/uL (ref 4.22–5.81)
RDW: 14.4 % (ref 11.5–15.5)
WBC: 4.5 10*3/uL (ref 4.0–10.5)
nRBC: 0 % (ref 0.0–0.2)

## 2019-10-30 MED ORDER — ATORVASTATIN CALCIUM 40 MG PO TABS: 40.0000 mg | ORAL_TABLET | Freq: Every day | ORAL | 2 refills | Status: DC

## 2019-10-30 MED ORDER — HEPARIN SOD (PORK) LOCK FLUSH 100 UNIT/ML IV SOLN
500.0000 [IU] | Freq: Once | INTRAVENOUS | Status: DC
  Filled 2019-10-30: qty 5

## 2019-10-30 MED ORDER — SODIUM CHLORIDE 0.9% FLUSH
10.0000 mL | INTRAVENOUS | Status: DC | PRN
  Filled 2019-10-30: qty 10

## 2019-10-30 NOTE — Telephone Encounter (Signed)
°  Patient requesting atorvastatin (LIPITOR) 40 MG tablet please allow 48 to 72 hour turn around   [facility] [place], [place] [address] Phone:  [phone]  Fax:  [phone]

## 2019-10-30 NOTE — Assessment & Plan Note (Addendum)
#  Kidney cancer-clear cell carcinoma.  Stage III, pT3N0- Currently receiving adjuvant Sutent 50 mg, 2 weeks on, 1 week off. CT FEB 2021 [Urology]-no evidence of any recurrence.   # Currently labs adequate-except for mildly elevated LFTs-see below.  # Hypertension-from Sutent poor controlled; 145/90s-recommend close monitoring at home.  Bring the log at next visit.  # LFTs-AST ALT 70s-normal bilirubin likely secondary to Sutent.  Monitor closely.  Asymptomatic.  # Hypothyroidism-  on synthroid 100 mcg/d; NOV 2020-TSH-Normal.  Stable; will check at next visit.   # DISPOSITION: # 4 weeks- MD-labs- cbc/cmp/TSH Dr.B  # I reviewed the blood work- with the patient in detail; also reviewed the imaging independently [as summarized above]; and with the patient in detail.

## 2019-10-30 NOTE — Progress Notes (Signed)
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 Sniskinski]; July CT 2020- mid anterior left kidney measuring 8.6 x 8.1 x 9.4 cm; CT chest negative  #April 06, 2019-Sutent 50 mg 2 weeks on 1 week off.   # DM- 2- OHA/ HTN  DIAGNOSIS: Left kidney cancer  STAGE: 3       ;GOALS: Cure  CURRENT/MOST RECENT THERAPY: Sutent    Cancer of left kidney ([place])  02/26/2019 Initial Diagnosis   Cancer of left kidney ([place])      HISTORY OF PRESENTING ILLNESS:  [facility] Mathis 49 y.o.  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    In the interim patient had a CT scan with urology.  Denies any nausea vomiting abdominal pain.  Denies any weight loss.   Mild fatigue.  Otherwise no skin rash.    Review of Systems  Constitutional: Negative for chills, diaphoresis, fever, malaise/fatigue and weight loss.  HENT: Negative for nosebleeds and sore throat.   Eyes: Negative for double vision.  Respiratory: Negative for cough, hemoptysis, sputum production, shortness of breath and wheezing.   Cardiovascular: Negative for chest pain, palpitations, orthopnea and leg swelling.  Gastrointestinal: Negative for abdominal pain, blood in stool, constipation, diarrhea, heartburn, melena, nausea and vomiting.  Genitourinary: Negative for dysuria, frequency and urgency.  Musculoskeletal: Positive for joint pain (knees (chronic)). Negative for back pain and myalgias.  Skin: Negative.  Negative for itching and rash.  Neurological: Negative for dizziness, tingling, focal weakness, weakness and headaches.  Endo/Heme/Allergies: Does not bruise/bleed easily.   Psychiatric/Behavioral: Negative for depression. The patient is not nervous/anxious and does not have insomnia.      MEDICAL HISTORY:  Past Medical History:  Diagnosis Date  . Allergy   . Diabetes mellitus without complication ([place])   . Hypertension   . Renal cancer, left ([place]) 01/2019   Left Nephrectomy    SURGICAL HISTORY: Past Surgical History:  Procedure Laterality Date  . CYSTOSCOPY  02/06/2019   Procedure: CYSTOSCOPY;  Surgeon: Billey Co, MD;  Location: [facility] ORS;  Service: Urology;;  . LAPAROSCOPIC NEPHRECTOMY, HAND ASSISTED Left 02/06/2019   Procedure: HAND ASSISTED LAPAROSCOPIC LEFT RADICAL NEPHRECTOMY;  Surgeon: Billey Co, MD;  Location: [facility] ORS;  Service: Urology;  Laterality: Left;  . NO PAST SURGERIES      SOCIAL HISTORY: Social History   Socioeconomic History  . Marital status: Married    Spouse name: Not on file  . Number of children: Not on file  . Years of education: Not on file  . Highest education level: Not on file  Occupational History  . Not on file  Tobacco Use  . Smoking status: Never Smoker  . Smokeless tobacco: Never Used  Substance and Sexual Activity  . Alcohol use: No  . Drug use: No  . Sexual activity: Yes    Birth control/protection: Other-see comments    Comment: mutually monogamous relationship partner postmenopausal  Other Topics Concern  . Not on file  Social History Narrative   In [place]/ with wife; son Cherlyn Cushing syndrome]; no smoking/ no alcohol; works for Conservator, museum/gallery.    Social Determinants of Health   Financial Resource Strain:   .  Difficulty of Paying Living Expenses:   Food Insecurity:   . Worried About Charity fundraiser in the Last Year:   . Arboriculturist in the Last Year:   Transportation Needs:   . Film/video editor (Medical):   Marland Kitchen Lack of Transportation (Non-Medical):   Physical Activity:   . Days of Exercise per Week:   . Minutes of Exercise per Session:   Stress:   . Feeling  of Stress :   Social Connections:   . Frequency of Communication with Friends and Family:   . Frequency of Social Gatherings with Friends and Family:   . Attends Religious Services:   . Active Member of Clubs or Organizations:   . Attends Archivist Meetings:   Marland Kitchen Marital Status:   Intimate Partner Violence:   . Fear of Current or Ex-Partner:   . Emotionally Abused:   Marland Kitchen Physically Abused:   . Sexually Abused:     FAMILY HISTORY: Family History  Problem Relation Age of Onset  . Diabetes Mother   . Heart disease Mother   . Mental illness Mother   . Thyroid disease Mother   . Heart disease Father   . Diabetes Father   . Mental illness Father     ALLERGIES:  has No Known Allergies.  MEDICATIONS:  Current Outpatient Medications  Medication Sig Dispense Refill  . acetaminophen (TYLENOL) 325 MG tablet Take by mouth every 6 (six) hours as needed for headache.    Marland Kitchen amLODipine (NORVASC) 10 MG tablet TAKE ONE TABLET BY MOUTH EVERY DAY 90 tablet 0  . aspirin EC 81 MG tablet Take 81 mg by mouth daily.    . carvedilol (COREG) 25 MG tablet TAKE 1 TABLET TWICE A DAY WITH A MEAL 180 tablet 0  . chlorthalidone (HYGROTON) 25 MG tablet Take 0.5 tablets (12.5 mg total) by mouth daily. 45 tablet 0  . fluticasone (FLONASE) 50 MCG/ACT nasal spray Place 2 sprays into both nostrils daily.    Marland Kitchen levothyroxine (SYNTHROID) 100 MCG tablet TAKE ONE TABLET BY MOUTH EVERY DAY 90 tablet 0  . metFORMIN (GLUCOPHAGE) 500 MG tablet Take 1 tablet (500 mg total) by mouth 2 (two) times daily with a meal. 180 tablet 0  . montelukast (SINGULAIR) 10 MG tablet One a day 30 tablet 3  . olmesartan (BENICAR) 40 MG tablet TAKE ONE TABLET BY MOUTH EVERY DAY 90 tablet 0  . potassium chloride (KLOR-CON) 10 MEQ tablet Take 1 tablet (10 mEq total) by mouth daily. 90 tablet 0  . SUNItinib (SUTENT) 50 MG capsule Take 1 capsule (50 mg total) by mouth daily. One pill a day; 2 weeks-On and 1 week-OFF 14 capsule 6  .  atorvastatin (LIPITOR) 40 MG tablet Take 1 tablet (40 mg total) by mouth daily. 30 tablet 2   Current Facility-Administered Medications  Medication Dose Route Frequency Provider Last Rate Last Admin  . heparin lock flush 100 unit/mL  500 Units Intravenous Once Charlaine Dalton R, MD      . sodium chloride flush (NS) 0.9 % injection 10 mL  10 mL Intravenous PRN Cammie Sickle, MD        PHYSICAL EXAMINATION: ECOG PERFORMANCE STATUS: 0 - Asymptomatic  Vitals:   10/30/19 0936 10/30/19 0939  BP: (!) 154/102 (!) 145/94  Pulse: 68   Resp: 20   Temp: (!) 96.6 F (35.9 C)    Filed Weights   10/30/19 0936  Weight: 249 lb (112.9 kg)  Physical Exam  Constitutional: He is oriented to person, place, and time and well-developed, well-nourished, and in no distress.  Unaccompanied, wearing mask.  HENT:  Head: Normocephalic and atraumatic.  Mouth/Throat: Oropharynx is clear and moist. No oropharyngeal exudate.  Eyes: Conjunctivae are normal. No scleral icterus.  Cardiovascular: Normal rate and regular rhythm.  Pulmonary/Chest: Effort normal and breath sounds normal. No respiratory distress. He has no wheezes.  Abdominal: Soft. Bowel sounds are normal. He exhibits no distension and no mass. There is no abdominal tenderness. There is no rebound and no guarding.  Musculoskeletal:        General: No tenderness or edema. Normal range of motion.     Cervical back: Normal range of motion and neck supple.  Neurological: He is alert and oriented to person, place, and time.  Skin: Skin is warm and dry.  Psychiatric: Mood and affect normal.    LABORATORY DATA:  I have reviewed the data as listed Lab Results  Component Value Date   WBC 4.5 10/30/2019   HGB 12.6 (L) 10/30/2019   HCT 37.1 (L) 10/30/2019   MCV 86.5 10/30/2019   PLT 182 10/30/2019   Recent Labs    08/28/19 1030 09/25/19 1023 10/30/19 0922  NA 135 137 136  K 3.5 3.4* 3.4*  CL 98 98 99  CO2 25 25 26   GLUCOSE  120* 117* 119*  BUN 19 19 20   CREATININE 1.17 1.29* 1.25*  CALCIUM 9.2 9.3 9.2  GFRNONAA >60 >60 >60  GFRAA >60 >60 >60  PROT 7.9 8.0 8.1  ALBUMIN 4.3 4.4 4.1  AST 57* 76* 59*  ALT 67* 81* 70*  ALKPHOS 59 64 63  BILITOT 0.7 1.0 0.8    RADIOGRAPHIC STUDIES: I have personally reviewed the radiological images as listed and agreed with the findings in the report. No results found.  ASSESSMENT & PLAN:  Cancer of left kidney ([place]) #Kidney cancer-clear cell carcinoma.  Stage III, pT3N0- Currently receiving adjuvant Sutent 50 mg, 2 weeks on, 1 week off. CT FEB 2021 [Urology]-no evidence of any recurrence.   # Currently labs adequate-except for mildly elevated LFTs-see below.  # Hypertension-from Sutent poor controlled; 145/90s-recommend close monitoring at home.  Bring the log at next visit.  # LFTs-AST ALT 70s-normal bilirubin likely secondary to Sutent.  Monitor closely.  Asymptomatic.  # Hypothyroidism-  on synthroid 100 mcg/d; NOV 2020-TSH-Normal.  Stable; will check at next visit.   # DISPOSITION: # 4 weeks- MD-labs- cbc/cmp/TSH Dr.B  All questions were answered. The patient knows to call the clinic with any problems, questions or concerns.   Beckey Rutter, DNP, AGNP-C [facility] [place]: Dr. Rogue Bussing

## 2019-10-30 NOTE — Telephone Encounter (Signed)
Copied from [place] [id number]. Topic: Quick Communication - Rx Refill/Question >> Oct 30, 2019 12:27 PM Roby Donaway, Rexene Edison, RN wrote: Medication: lipitor  Has the patient contacted their pharmacy? yes (Agent: If no, request that the patient contact the pharmacy for the refill.) (Agent: If yes, when and what did the pharmacy advise?)  Preferred Pharmacy (with phone number or street name): pharmacy on file is correct  Agent: Please be advised that RX refills may take up to 3 business days. We ask that you follow-up with your pharmacy.

## 2019-10-30 NOTE — Progress Notes (Signed)
Patient has been out of coreg for 1 week due to pharmacy/pcp not having medication available/prescribed for the patient. bp elevated today 154/102. Recheck bp 145/94.  He started back on the coreg yesterday. Patient advised to not miss any dosages of the bp medications. He informed that in Sutent can cause elevated bps. He gave verbal understanding and agreed to be proactive on taking his bp medications.

## 2019-11-27 ENCOUNTER — Other Ambulatory Visit: Payer: Self-pay

## 2019-11-27 ENCOUNTER — Encounter: Payer: Self-pay | Admitting: Internal Medicine

## 2019-11-27 ENCOUNTER — Inpatient Hospital Stay (HOSPITAL_BASED_OUTPATIENT_CLINIC_OR_DEPARTMENT_OTHER): Payer: Self-pay | Admitting: Internal Medicine

## 2019-11-27 ENCOUNTER — Inpatient Hospital Stay: Payer: Self-pay | Attending: Internal Medicine

## 2019-11-27 DIAGNOSIS — E119 Type 2 diabetes mellitus without complications: Secondary | ICD-10-CM | POA: Insufficient documentation

## 2019-11-27 DIAGNOSIS — R7989 Other specified abnormal findings of blood chemistry: Secondary | ICD-10-CM | POA: Insufficient documentation

## 2019-11-27 DIAGNOSIS — C642 Malignant neoplasm of left kidney, except renal pelvis: Secondary | ICD-10-CM

## 2019-11-27 DIAGNOSIS — Z7982 Long term (current) use of aspirin: Secondary | ICD-10-CM | POA: Insufficient documentation

## 2019-11-27 DIAGNOSIS — Z7984 Long term (current) use of oral hypoglycemic drugs: Secondary | ICD-10-CM | POA: Insufficient documentation

## 2019-11-27 DIAGNOSIS — E039 Hypothyroidism, unspecified: Secondary | ICD-10-CM | POA: Insufficient documentation

## 2019-11-27 DIAGNOSIS — I10 Essential (primary) hypertension: Secondary | ICD-10-CM | POA: Insufficient documentation

## 2019-11-27 DIAGNOSIS — Z79899 Other long term (current) drug therapy: Secondary | ICD-10-CM | POA: Insufficient documentation

## 2019-11-27 LAB — CBC WITH DIFFERENTIAL/PLATELET
Abs Immature Granulocytes: 0.03 10*3/uL (ref 0.00–0.07)
Basophils Absolute: 0 10*3/uL (ref 0.0–0.1)
Basophils Relative: 0 %
Eosinophils Absolute: 0.3 10*3/uL (ref 0.0–0.5)
Eosinophils Relative: 6 %
HCT: 40.5 % (ref 39.0–52.0)
Hemoglobin: 13.7 g/dL (ref 13.0–17.0)
Immature Granulocytes: 1 %
Lymphocytes Relative: 33 %
Lymphs Abs: 1.6 10*3/uL (ref 0.7–4.0)
MCH: 28.8 pg (ref 26.0–34.0)
MCHC: 33.8 g/dL (ref 30.0–36.0)
MCV: 85.1 fL (ref 80.0–100.0)
Monocytes Absolute: 0.4 10*3/uL (ref 0.1–1.0)
Monocytes Relative: 9 %
Neutro Abs: 2.4 10*3/uL (ref 1.7–7.7)
Neutrophils Relative %: 51 %
Platelets: 176 10*3/uL (ref 150–400)
RBC: 4.76 MIL/uL (ref 4.22–5.81)
RDW: 14.8 % (ref 11.5–15.5)
WBC: 4.8 10*3/uL (ref 4.0–10.5)
nRBC: 0 % (ref 0.0–0.2)

## 2019-11-27 LAB — COMPREHENSIVE METABOLIC PANEL
ALT: 60 U/L — ABNORMAL HIGH (ref 0–44)
AST: 54 U/L — ABNORMAL HIGH (ref 15–41)
Albumin: 4.2 g/dL (ref 3.5–5.0)
Alkaline Phosphatase: 65 U/L (ref 38–126)
Anion gap: 13 (ref 5–15)
BUN: 18 mg/dL (ref 6–20)
CO2: 26 mmol/L (ref 22–32)
Calcium: 9.2 mg/dL (ref 8.9–10.3)
Chloride: 97 mmol/L — ABNORMAL LOW (ref 98–111)
Creatinine, Ser: 1.24 mg/dL (ref 0.61–1.24)
GFR calc Af Amer: >60 mL/min (ref >60)
GFR calc non Af Amer: >60 mL/min (ref >60)
Glucose, Bld: 140 mg/dL — ABNORMAL HIGH (ref 70–99)
Potassium: 3.2 mmol/L — ABNORMAL LOW (ref 3.5–5.1)
Sodium: 136 mmol/L (ref 135–145)
Total Bilirubin: 0.9 mg/dL (ref 0.3–1.2)
Total Protein: 8.2 g/dL — ABNORMAL HIGH (ref 6.5–8.1)

## 2019-11-27 LAB — TSH: TSH: 3.331 u[IU]/mL (ref 0.350–4.500)

## 2019-11-27 NOTE — Progress Notes (Signed)
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 Sniskinski]; July CT 2020- mid anterior left kidney measuring 8.6 x 8.1 x 9.4 cm; CT chest negative  #April 06, 2019-Sutent 50 mg 2 weeks on 1 week off.   # DM- 2- OHA/ HTN  DIAGNOSIS: Left kidney cancer  STAGE: 3       ;GOALS: Cure  CURRENT/MOST RECENT THERAPY: Sutent    Cancer of left kidney ([place])  02/26/2019 Initial Diagnosis   Cancer of left kidney ([place])      HISTORY OF PRESENTING ILLNESS:  [facility] Mathis 49 y.o.  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    Patient denies abdominal pain.  Any nausea vomiting.  Any chest pain or cough.  No rash.  Review of Systems  Constitutional: Negative for chills, diaphoresis, fever, malaise/fatigue and weight loss.  HENT: Negative for nosebleeds and sore throat.   Eyes: Negative for double vision.  Respiratory: Negative for cough, hemoptysis, sputum production, shortness of breath and wheezing.   Cardiovascular: Negative for chest pain, palpitations, orthopnea and leg swelling.  Gastrointestinal: Negative for abdominal pain, blood in stool, constipation, diarrhea, heartburn, melena, nausea and vomiting.  Genitourinary: Negative for dysuria, frequency and urgency.  Musculoskeletal: Positive for joint pain (knees (chronic)). Negative for back pain and myalgias.  Skin: Negative.  Negative for itching and rash.  Neurological: Negative for dizziness, tingling, focal weakness, weakness and headaches.  Endo/Heme/Allergies: Does not bruise/bleed easily.  Psychiatric/Behavioral: Negative for depression. The patient is not nervous/anxious and does  not have insomnia.      MEDICAL HISTORY:  Past Medical History:  Diagnosis Date  . Allergy   . Diabetes mellitus without complication ([place])   . Hypertension   . Renal cancer, left ([place]) 01/2019   Left Nephrectomy    SURGICAL HISTORY: Past Surgical History:  Procedure Laterality Date  . CYSTOSCOPY  02/06/2019   Procedure: CYSTOSCOPY;  Surgeon: Billey Co, MD;  Location: [facility] ORS;  Service: Urology;;  . LAPAROSCOPIC NEPHRECTOMY, HAND ASSISTED Left 02/06/2019   Procedure: HAND ASSISTED LAPAROSCOPIC LEFT RADICAL NEPHRECTOMY;  Surgeon: Billey Co, MD;  Location: [facility] ORS;  Service: Urology;  Laterality: Left;  . NO PAST SURGERIES      SOCIAL HISTORY: Social History   Socioeconomic History  . Marital status: Married    Spouse name: Not on file  . Number of children: Not on file  . Years of education: Not on file  . Highest education level: Not on file  Occupational History  . Not on file  Tobacco Use  . Smoking status: Never Smoker  . Smokeless tobacco: Never Used  Substance and Sexual Activity  . Alcohol use: No  . Drug use: No  . Sexual activity: Yes    Birth control/protection: Other-see comments    Comment: mutually monogamous relationship partner postmenopausal  Other Topics Concern  . 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    Social Determinants of Health   Financial Resource Strain:   . Difficulty of Paying Living Expenses:   Food Insecurity:   . Worried About Estate manager/land agent  of Food in the Last Year:   . [place] in the Last Year:   Transportation Needs:   . Lack of Transportation (Medical):   Marland Kitchen Lack of Transportation (Non-Medical):   Physical Activity:   . Days of Exercise per Week:   . Minutes of Exercise per Session:   Stress:   . Feeling of Stress :   Social Connections:   . Frequency of Communication with Friends and Family:   .  Frequency of Social Gatherings with Friends and Family:   . Attends Religious Services:   . Active Member of Clubs or Organizations:   . Attends Archivist Meetings:   Marland Kitchen Marital Status:   Intimate Partner Violence:   . Fear of Current or Ex-Partner:   . Emotionally Abused:   Marland Kitchen Physically Abused:   . Sexually Abused:     FAMILY HISTORY: Family History  Problem Relation Age of Onset  . Diabetes Mother   . Heart disease Mother   . Mental illness Mother   . Thyroid disease Mother   . Heart disease Father   . Diabetes Father   . Mental illness Father     ALLERGIES:  has No Known Allergies.  MEDICATIONS:  Current Outpatient Medications  Medication Sig Dispense Refill  . acetaminophen (TYLENOL) 325 MG tablet Take by mouth every 6 (six) hours as needed for headache.    Marland Kitchen amLODipine (NORVASC) 10 MG tablet TAKE ONE TABLET BY MOUTH EVERY DAY 90 tablet 0  . aspirin EC 81 MG tablet Take 81 mg by mouth daily.    Marland Kitchen atorvastatin (LIPITOR) 40 MG tablet Take 1 tablet (40 mg total) by mouth daily. 30 tablet 2  . carvedilol (COREG) 25 MG tablet TAKE 1 TABLET TWICE A DAY WITH A MEAL 180 tablet 0  . chlorthalidone (HYGROTON) 25 MG tablet Take 0.5 tablets (12.5 mg total) by mouth daily. 45 tablet 0  . fluticasone (FLONASE) 50 MCG/ACT nasal spray Place 2 sprays into both nostrils daily.    Marland Kitchen levothyroxine (SYNTHROID) 100 MCG tablet TAKE ONE TABLET BY MOUTH EVERY DAY 90 tablet 0  . metFORMIN (GLUCOPHAGE) 500 MG tablet Take 1 tablet (500 mg total) by mouth 2 (two) times daily with a meal. 180 tablet 0  . montelukast (SINGULAIR) 10 MG tablet One a day 30 tablet 3  . olmesartan (BENICAR) 40 MG tablet TAKE ONE TABLET BY MOUTH EVERY DAY 90 tablet 0  . potassium chloride (KLOR-CON) 10 MEQ tablet Take 1 tablet (10 mEq total) by mouth daily. 90 tablet 0  . SUNItinib (SUTENT) 50 MG capsule Take 1 capsule (50 mg total) by mouth daily. One pill a day; 2 weeks-On and 1 week-OFF 14 capsule 6   No  current facility-administered medications for this visit.    PHYSICAL EXAMINATION: ECOG PERFORMANCE STATUS: 0 - Asymptomatic  Vitals:   11/27/19 1015  BP: (!) 132/92  Pulse: 84  Resp: 16  Temp: (!) 95.3 F (35.2 C)  SpO2: 98%   Filed Weights   11/27/19 1015  Weight: 248 lb 3.2 oz (112.6 kg)    Physical Exam  Constitutional: He is oriented to person, place, and time and well-developed, well-nourished, and in no distress.  Unaccompanied, wearing mask.  HENT:  Head: Normocephalic and atraumatic.  Mouth/Throat: Oropharynx is clear and moist. No oropharyngeal exudate.  Eyes: Conjunctivae are normal. No scleral icterus.  Cardiovascular: Normal rate and regular rhythm.  Pulmonary/Chest: Effort normal and breath sounds normal. No respiratory distress. He  has no wheezes.  Abdominal: Soft. Bowel sounds are normal. He exhibits no distension and no mass. There is no abdominal tenderness. There is no rebound and no guarding.  Musculoskeletal:        General: No tenderness or edema. Normal range of motion.     Cervical back: Normal range of motion and neck supple.  Neurological: He is alert and oriented to person, place, and time.  Skin: Skin is warm and dry.  Psychiatric: Mood and affect normal.    LABORATORY DATA:  I have reviewed the data as listed Lab Results  Component Value Date   WBC 4.8 11/27/2019   HGB 13.7 11/27/2019   HCT 40.5 11/27/2019   MCV 85.1 11/27/2019   PLT 176 11/27/2019   Recent Labs    09/25/19 1023 10/30/19 0922 11/27/19 0949  NA 137 136 136  K 3.4* 3.4* 3.2*  CL 98 99 97*  CO2 25 26 26   GLUCOSE 117* 119* 140*  BUN 19 20 18   CREATININE 1.29* 1.25* 1.24  CALCIUM 9.3 9.2 9.2  GFRNONAA >60 >60 >60  GFRAA >60 >60 >60  PROT 8.0 8.1 8.2*  ALBUMIN 4.4 4.1 4.2  AST 76* 59* 54*  ALT 81* 70* 60*  ALKPHOS 64 63 65  BILITOT 1.0 0.8 0.9    RADIOGRAPHIC STUDIES: I have personally reviewed the radiological images as listed and agreed with the findings  in the report. No results found.  ASSESSMENT & PLAN:  Cancer of left kidney ([place]) #Kidney cancer-clear cell carcinoma.  Stage III, pT3N0- Currently receiving adjuvant Sutent 50 mg, 2 weeks on, 1 week off. CT FEB 2021 [Urology]-no evidence of any recurrence.  Stable.  # Currently labs adequate-except for mildly elevated LFTs-see below.  # Hypertension-from Sutent; stable.  145/90s-recommend close monitoring at home.  # LFTs-AST ALT 40-40s--normal bilirubin likely secondary to Sutent.  Monitor closely asymptomatic.  # Hypothyroidism-  on synthroid 100 mcg/d; NOV 2020-TSH-Normal.  Stable; awaiting TSH today   # DISPOSITION: # 6 weeks- MD-labs- cbc/cmp/TSH Dr.B   All questions were answered. The patient knows to call the clinic with any problems, questions or concerns.   Beckey Rutter, DNP, AGNP-C [facility] [place]: Dr. Rogue Bussing

## 2019-11-27 NOTE — Progress Notes (Signed)
Pt in for follow up, reports not checking BP regularly.  Reports "just normal fatigue and some aches and pains at times.

## 2019-11-27 NOTE — Assessment & Plan Note (Addendum)
#  Kidney cancer-clear cell carcinoma.  Stage III, pT3N0- Currently receiving adjuvant Sutent 50 mg, 2 weeks on, 1 week off. CT FEB 2021 [Urology]-no evidence of any recurrence.  Stable.  # Currently labs adequate-except for mildly elevated LFTs-see below.  # Hypertension-from Sutent; stable.  145/90s-recommend close monitoring at home.  # LFTs-AST ALT 40-40s--normal bilirubin likely secondary to Sutent.  Monitor closely asymptomatic.  # Hypothyroidism-  on synthroid 100 mcg/d; NOV 2020-TSH-Normal.  Stable; awaiting TSH today   # DISPOSITION: # 6 weeks- MD-labs- cbc/cmp/TSH Dr.B

## 2019-12-02 ENCOUNTER — Other Ambulatory Visit: Payer: Self-pay | Admitting: Family Medicine

## 2019-12-02 DIAGNOSIS — I10 Essential (primary) hypertension: Secondary | ICD-10-CM

## 2020-01-07 ENCOUNTER — Other Ambulatory Visit: Payer: Self-pay

## 2020-01-07 ENCOUNTER — Encounter: Payer: Self-pay | Admitting: Internal Medicine

## 2020-01-08 ENCOUNTER — Inpatient Hospital Stay: Payer: Self-pay | Attending: Internal Medicine

## 2020-01-08 ENCOUNTER — Inpatient Hospital Stay (HOSPITAL_BASED_OUTPATIENT_CLINIC_OR_DEPARTMENT_OTHER): Payer: Self-pay | Admitting: Internal Medicine

## 2020-01-08 DIAGNOSIS — I10 Essential (primary) hypertension: Secondary | ICD-10-CM | POA: Insufficient documentation

## 2020-01-08 DIAGNOSIS — M2550 Pain in unspecified joint: Secondary | ICD-10-CM | POA: Insufficient documentation

## 2020-01-08 DIAGNOSIS — G8929 Other chronic pain: Secondary | ICD-10-CM | POA: Insufficient documentation

## 2020-01-08 DIAGNOSIS — C642 Malignant neoplasm of left kidney, except renal pelvis: Secondary | ICD-10-CM | POA: Insufficient documentation

## 2020-01-08 DIAGNOSIS — E119 Type 2 diabetes mellitus without complications: Secondary | ICD-10-CM | POA: Insufficient documentation

## 2020-01-08 DIAGNOSIS — Z905 Acquired absence of kidney: Secondary | ICD-10-CM | POA: Insufficient documentation

## 2020-01-08 DIAGNOSIS — Z85528 Personal history of other malignant neoplasm of kidney: Secondary | ICD-10-CM | POA: Insufficient documentation

## 2020-01-08 DIAGNOSIS — Z7982 Long term (current) use of aspirin: Secondary | ICD-10-CM | POA: Insufficient documentation

## 2020-01-08 DIAGNOSIS — Z79899 Other long term (current) drug therapy: Secondary | ICD-10-CM | POA: Insufficient documentation

## 2020-01-08 DIAGNOSIS — Z7984 Long term (current) use of oral hypoglycemic drugs: Secondary | ICD-10-CM | POA: Insufficient documentation

## 2020-01-08 DIAGNOSIS — E039 Hypothyroidism, unspecified: Secondary | ICD-10-CM | POA: Insufficient documentation

## 2020-01-08 LAB — COMPREHENSIVE METABOLIC PANEL
ALT: 75 U/L — ABNORMAL HIGH (ref 0–44)
AST: 69 U/L — ABNORMAL HIGH (ref 15–41)
Albumin: 4.2 g/dL (ref 3.5–5.0)
Alkaline Phosphatase: 59 U/L (ref 38–126)
Anion gap: 13 (ref 5–15)
BUN: 16 mg/dL (ref 6–20)
CO2: 26 mmol/L (ref 22–32)
Calcium: 9 mg/dL (ref 8.9–10.3)
Chloride: 99 mmol/L (ref 98–111)
Creatinine, Ser: 1.3 mg/dL — ABNORMAL HIGH (ref 0.61–1.24)
GFR calc Af Amer: >60 mL/min (ref >60)
GFR calc non Af Amer: >60 mL/min (ref >60)
Glucose, Bld: 114 mg/dL — ABNORMAL HIGH (ref 70–99)
Potassium: 3.3 mmol/L — ABNORMAL LOW (ref 3.5–5.1)
Sodium: 138 mmol/L (ref 135–145)
Total Bilirubin: 0.8 mg/dL (ref 0.3–1.2)
Total Protein: 8.1 g/dL (ref 6.5–8.1)

## 2020-01-08 LAB — CBC WITH DIFFERENTIAL/PLATELET
Abs Immature Granulocytes: 0.01 10*3/uL (ref 0.00–0.07)
Basophils Absolute: 0 10*3/uL (ref 0.0–0.1)
Basophils Relative: 0 %
Eosinophils Absolute: 0.4 10*3/uL (ref 0.0–0.5)
Eosinophils Relative: 9 %
HCT: 39.3 % (ref 39.0–52.0)
Hemoglobin: 13.4 g/dL (ref 13.0–17.0)
Immature Granulocytes: 0 %
Lymphocytes Relative: 35 %
Lymphs Abs: 1.6 10*3/uL (ref 0.7–4.0)
MCH: 29.1 pg (ref 26.0–34.0)
MCHC: 34.1 g/dL (ref 30.0–36.0)
MCV: 85.4 fL (ref 80.0–100.0)
Monocytes Absolute: 0.5 10*3/uL (ref 0.1–1.0)
Monocytes Relative: 11 %
Neutro Abs: 2 10*3/uL (ref 1.7–7.7)
Neutrophils Relative %: 45 %
Platelets: 175 10*3/uL (ref 150–400)
RBC: 4.6 MIL/uL (ref 4.22–5.81)
RDW: 15.6 % — ABNORMAL HIGH (ref 11.5–15.5)
WBC: 4.5 10*3/uL (ref 4.0–10.5)
nRBC: 0 % (ref 0.0–0.2)

## 2020-01-08 NOTE — Assessment & Plan Note (Addendum)
#  Kidney cancer-clear cell carcinoma.  Stage III, pT3N0- Currently receiving adjuvant Sutent 50 mg, 2 weeks on, 1 week off. CT FEB 2021 [Urology]-no evidence of any recurrence.  STABLE.   #I reviewed the data with regard to Sutent that shows unfortunately no survival difference between Sutent versus placebo.  I also discussed the recent data available regarding use of immunotherapy/pembrolizumab for high risk RCC.  Unfortunately the study still only shows improvement in disease-free survival at 2 years; but no overall survival data yet.  Immunotherapy is not approved by FDA.  #Patient will plan to finish total of 1 year of Sutent this September.  And continue surveillance.  # Hypertension-from Sutent; STABLE.   133/90s-recommend close monitoring at home.  # LFTs-AST ALT 450-80s-STABLE; G-1; -normal bilirubin likely secondary to Sutent.  Monitor closely asymptomatic.  # Hypothyroidism-  on synthroid 100 mcg/d; May 2021-TSH-Normal.  STABLE.   # DISPOSITION: # 6 weeks- MD-labs- cbc/cmp Dr.B

## 2020-01-08 NOTE — Progress Notes (Signed)
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 Sniskinski]; July CT 2020- mid anterior left kidney measuring 8.6 x 8.1 x 9.4 cm; CT chest negative  #April 06, 2019-Sutent 50 mg 2 weeks on 1 week off.   # DM- 2- OHA/ HTN  DIAGNOSIS: Left kidney cancer  STAGE: 3       ;GOALS: Cure  CURRENT/MOST RECENT THERAPY: Sutent    Cancer of left kidney ([place])  02/26/2019 Initial Diagnosis   Cancer of left kidney ([place])      HISTORY OF PRESENTING ILLNESS:  Charles Mathis 49 y.o.  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    No nausea no vomiting pain abdominal pain.  No significant fatigue.  Chronic joint pains.  No rash.  No shortness of breath or cough  Review of Systems  Constitutional: Negative for chills, diaphoresis, fever, malaise/fatigue and weight loss.  HENT: Negative for nosebleeds and sore throat.   Eyes: Negative for double vision.  Respiratory: Negative for cough, hemoptysis, sputum production, shortness of breath and wheezing.   Cardiovascular: Negative for chest pain, palpitations, orthopnea and leg swelling.  Gastrointestinal: Negative for abdominal pain, blood in stool, constipation, diarrhea, heartburn, melena, nausea and vomiting.  Genitourinary: Negative for dysuria, frequency and urgency.  Musculoskeletal: Positive for joint pain (knees (chronic)). Negative for back pain and myalgias.  Skin: Negative.  Negative for itching and rash.  Neurological: Negative for dizziness, tingling, focal weakness, weakness and headaches.  Endo/Heme/Allergies: Does not bruise/bleed easily.  Psychiatric/Behavioral: Negative for depression.  The patient is not nervous/anxious and does not have insomnia.      MEDICAL HISTORY:  Past Medical History:  Diagnosis Date  . Allergy   . Diabetes mellitus without complication ([place])   . Hypertension   . Renal cancer, left ([place]) 01/2019   Left Nephrectomy    SURGICAL HISTORY: Past Surgical History:  Procedure Laterality Date  . CYSTOSCOPY  02/06/2019   Procedure: CYSTOSCOPY;  Surgeon: Billey Co, MD;  Location: [facility] ORS;  Service: Urology;;  . LAPAROSCOPIC NEPHRECTOMY, HAND ASSISTED Left 02/06/2019   Procedure: HAND ASSISTED LAPAROSCOPIC LEFT RADICAL NEPHRECTOMY;  Surgeon: Billey Co, MD;  Location: [facility] ORS;  Service: Urology;  Laterality: Left;  . NO PAST SURGERIES      SOCIAL HISTORY: Social History   Socioeconomic History  . Marital status: Married    Spouse name: Not on file  . Number of children: Not on file  . Years of education: Not on file  . Highest education level: Not on file  Occupational History  . Not on file  Tobacco Use  . Smoking status: Never Smoker  . Smokeless tobacco: Never Used  Vaping Use  . Vaping Use: Never used  Substance and Sexual Activity  . Alcohol use: No  . Drug use: No  . Sexual activity: Yes    Birth control/protection: Other-see comments    Comment: mutually monogamous relationship partner postmenopausal  Other Topics Concern  . Not on file  Social History Narrative   In [place]/ with wife; son Charles Mathis syndrome]; no smoking/ no alcohol; works for [employer], museum/gallery.    Social Determinants of Health   Financial Resource Strain:   .  Difficulty of Paying Living Expenses:   Food Insecurity:   . Worried About Charity fundraiser in the Last Year:   . Arboriculturist in the Last Year:   Transportation Needs:   . Film/video editor (Medical):   Marland Kitchen Lack of Transportation (Non-Medical):   Physical Activity:   . Days of Exercise per Week:   . Minutes of Exercise per Session:   Stress:   . Feeling of Stress :    Social Connections:   . Frequency of Communication with Friends and Family:   . Frequency of Social Gatherings with Friends and Family:   . Attends Religious Services:   . Active Member of Clubs or Organizations:   . Attends Archivist Meetings:   Marland Kitchen Marital Status:   Intimate Partner Violence:   . Fear of Current or Ex-Partner:   . Emotionally Abused:   Marland Kitchen Physically Abused:   . Sexually Abused:     FAMILY HISTORY: Family History  Problem Relation Age of Onset  . Diabetes Mother   . Heart disease Mother   . Mental illness Mother   . Thyroid disease Mother   . Heart disease Father   . Diabetes Father   . Mental illness Father     ALLERGIES:  has No Known Allergies.  MEDICATIONS:  Current Outpatient Medications  Medication Sig Dispense Refill  . acetaminophen (TYLENOL) 325 MG tablet Take by mouth every 6 (six) hours as needed for headache.    Marland Kitchen amLODipine (NORVASC) 10 MG tablet TAKE ONE TABLET BY MOUTH EVERY DAY 90 tablet 0  . aspirin EC 81 MG tablet Take 81 mg by mouth daily.    Marland Kitchen atorvastatin (LIPITOR) 40 MG tablet Take 1 tablet (40 mg total) by mouth daily. 30 tablet 2  . carvedilol (COREG) 25 MG tablet TAKE 1 TABLET TWICE A DAY WITH A MEAL 180 tablet 0  . chlorthalidone (HYGROTON) 25 MG tablet TAKE 1/2 TABLET BY MOUTH EVERY DAY 45 tablet 1  . fluticasone (FLONASE) 50 MCG/ACT nasal spray Place 2 sprays into both nostrils daily.    Marland Kitchen levothyroxine (SYNTHROID) 100 MCG tablet TAKE ONE TABLET BY MOUTH EVERY DAY 90 tablet 0  . metFORMIN (GLUCOPHAGE) 500 MG tablet Take 1 tablet (500 mg total) by mouth 2 (two) times daily with a meal. 180 tablet 0  . montelukast (SINGULAIR) 10 MG tablet One a day 30 tablet 3  . olmesartan (BENICAR) 40 MG tablet TAKE ONE TABLET BY MOUTH EVERY DAY 90 tablet 0  . potassium chloride (KLOR-CON) 10 MEQ tablet Take 1 tablet (10 mEq total) by mouth daily. 90 tablet 0  . SUNItinib (SUTENT) 50 MG capsule Take 1 capsule (50 mg total) by mouth  daily. One pill a day; 2 weeks-On and 1 week-OFF 14 capsule 6   No current facility-administered medications for this visit.    PHYSICAL EXAMINATION: ECOG PERFORMANCE STATUS: 0 - Asymptomatic  Vitals:   01/08/20 1012  BP: (!) 133/92  Pulse: 61  Temp: (!) 96.4 F (35.8 C)   Filed Weights   01/08/20 1012  Weight: 264 lb (119.7 kg)    Physical Exam Constitutional:      Comments: Unaccompanied, wearing mask.  HENT:     Head: Normocephalic and atraumatic.     Mouth/Throat:     Pharynx: No oropharyngeal exudate.  Eyes:     General: No scleral icterus.    Conjunctiva/sclera: Conjunctivae normal.  Cardiovascular:     Rate and Rhythm: Normal  rate and regular rhythm.  Pulmonary:     Effort: Pulmonary effort is normal. No respiratory distress.     Breath sounds: Normal breath sounds. No wheezing.  Abdominal:     General: Bowel sounds are normal. There is no distension.     Palpations: Abdomen is soft. There is no mass.     Tenderness: There is no abdominal tenderness. There is no guarding or rebound.  Musculoskeletal:        General: No tenderness. Normal range of motion.     Cervical back: Normal range of motion and neck supple.  Skin:    General: Skin is warm and dry.  Neurological:     Mental Status: He is alert and oriented to person, place, and time.  Psychiatric:        Mood and Affect: Mood and affect normal.     LABORATORY DATA:  I have reviewed the data as listed Lab Results  Component Value Date   WBC 4.5 01/08/2020   HGB 13.4 01/08/2020   HCT 39.3 01/08/2020   MCV 85.4 01/08/2020   PLT 175 01/08/2020   Recent Labs    10/30/19 0922 11/27/19 0949 01/08/20 1003  NA 136 136 138  K 3.4* 3.2* 3.3*  CL 99 97* 99  CO2 26 26 26   GLUCOSE 119* 140* 114*  BUN 20 18 16   CREATININE 1.25* 1.24 1.30*  CALCIUM 9.2 9.2 9.0  GFRNONAA >60 >60 >60  GFRAA >60 >60 >60  PROT 8.1 8.2* 8.1  ALBUMIN 4.1 4.2 4.2  AST 59* 54* 69*  ALT 70* 60* 75*  ALKPHOS 63 65 59   BILITOT 0.8 0.9 0.8    RADIOGRAPHIC STUDIES: I have personally reviewed the radiological images as listed and agreed with the findings in the report. No results found.  ASSESSMENT & PLAN:  Cancer of left kidney ([place]) #Kidney cancer-clear cell carcinoma.  Stage III, pT3N0- Currently receiving adjuvant Sutent 50 mg, 2 weeks on, 1 week off. CT FEB 2021 [Urology]-no evidence of any recurrence.  STABLE.   #I reviewed the data with regard to Sutent that shows unfortunately no survival difference between Sutent versus placebo.  I also discussed the recent data available regarding use of immunotherapy/pembrolizumab for high risk RCC.  Unfortunately the study still only shows improvement in disease-free survival at 2 years; but no overall survival data yet.  Immunotherapy is not approved by FDA.  #Patient will plan to finish total of 1 year of Sutent this September.  And continue surveillance.  # Hypertension-from Sutent; STABLE.   133/90s-recommend close monitoring at home.  # LFTs-AST ALT 450-80s-STABLE; G-1; -normal bilirubin likely secondary to Sutent.  Monitor closely asymptomatic.  # Hypothyroidism-  on synthroid 100 mcg/d; May 2021-TSH-Normal.  STABLE.   # DISPOSITION: # 6 weeks- MD-labs- cbc/cmp Dr.B   All questions were answered. The patient knows to call the clinic with any problems, questions or concerns.   Beckey Rutter, DNP, AGNP-C [facility]: Dr. Rogue Bussing

## 2020-01-12 ENCOUNTER — Other Ambulatory Visit: Payer: Self-pay | Admitting: Internal Medicine

## 2020-01-17 ENCOUNTER — Other Ambulatory Visit: Payer: Self-pay | Admitting: Family Medicine

## 2020-01-17 DIAGNOSIS — I10 Essential (primary) hypertension: Secondary | ICD-10-CM

## 2020-01-21 MED ORDER — AMLODIPINE BESYLATE 10 MG PO TABS: 10.0000 mg | ORAL_TABLET | Freq: Every day | ORAL | 0 refills | Status: DC

## 2020-01-22 ENCOUNTER — Other Ambulatory Visit
Admission: RE | Admit: 2020-01-22 | Discharge: 2020-01-22 | Disposition: A | Discharge status: Home / Self Care | Payer: Self-pay | Source: Ambulatory Visit | Attending: Family Medicine | Admitting: Family Medicine

## 2020-01-22 DIAGNOSIS — E1165 Type 2 diabetes mellitus with hyperglycemia: Secondary | ICD-10-CM | POA: Insufficient documentation

## 2020-01-22 LAB — LIPID PANEL
Cholesterol: 154 mg/dL (ref 0–200)
HDL: 29 mg/dL — ABNORMAL LOW (ref >40)
LDL Cholesterol: 59 mg/dL (ref 0–99)
Total CHOL/HDL Ratio: 5.3 RATIO
Triglycerides: 331 mg/dL — ABNORMAL HIGH (ref <150)
VLDL: 66 mg/dL — ABNORMAL HIGH (ref 0–40)

## 2020-01-23 ENCOUNTER — Other Ambulatory Visit: Payer: Self-pay | Admitting: Family Medicine

## 2020-01-23 DIAGNOSIS — I10 Essential (primary) hypertension: Secondary | ICD-10-CM

## 2020-01-25 ENCOUNTER — Other Ambulatory Visit: Payer: Self-pay | Admitting: Family Medicine

## 2020-01-25 DIAGNOSIS — I10 Essential (primary) hypertension: Secondary | ICD-10-CM

## 2020-01-25 MED ORDER — POTASSIUM CHLORIDE CRYS ER 10 MEQ PO TBCR: 10.0000 meq | EXTENDED_RELEASE_TABLET | Freq: Two times a day (BID) | ORAL | 0 refills | Status: DC

## 2020-01-25 NOTE — Telephone Encounter (Signed)
Pt notified of Elmyra Ricks recommendation for him to start taking the Potassium 10 MEQ twice daily. The prescription was sent to the patient pharmacy. He verbalize understanding.

## 2020-01-25 NOTE — Progress Notes (Signed)
Reviewing labs, consistent decreased potassium, with potassium supplement.  Increasing potassium chloride from 28mEq daily to BID dosing.  Patient aware, verbalized understanding and denied any additional questions/concerns/needs.

## 2020-01-25 NOTE — Telephone Encounter (Signed)
We need to repeat labs to get his potassium up slightly, we may have to increase the dosage.  I can send in a refill to get him to a follow up visit with me in a few weeks.

## 2020-01-26 ENCOUNTER — Other Ambulatory Visit: Payer: Self-pay | Admitting: Family Medicine

## 2020-01-26 DIAGNOSIS — E1165 Type 2 diabetes mellitus with hyperglycemia: Secondary | ICD-10-CM

## 2020-01-26 DIAGNOSIS — I10 Essential (primary) hypertension: Secondary | ICD-10-CM

## 2020-01-28 MED ORDER — OLMESARTAN MEDOXOMIL 40 MG PO TABS: 40.0000 mg | ORAL_TABLET | Freq: Every day | ORAL | 0 refills | Status: DC

## 2020-01-28 MED ORDER — CARVEDILOL 25 MG PO TABS: ORAL_TABLET | ORAL | 0 refills | Status: DC

## 2020-01-28 MED ORDER — METFORMIN HCL 500 MG PO TABS: 500.0000 mg | ORAL_TABLET | Freq: Two times a day (BID) | ORAL | 0 refills | Status: DC

## 2020-02-18 ENCOUNTER — Other Ambulatory Visit: Payer: Self-pay | Admitting: *Deleted

## 2020-02-18 DIAGNOSIS — C642 Malignant neoplasm of left kidney, except renal pelvis: Secondary | ICD-10-CM

## 2020-02-19 ENCOUNTER — Inpatient Hospital Stay: Payer: Self-pay

## 2020-02-19 ENCOUNTER — Inpatient Hospital Stay: Payer: Self-pay | Admitting: Internal Medicine

## 2020-02-19 NOTE — Assessment & Plan Note (Signed)
#  Kidney cancer-clear cell carcinoma.  Stage III, pT3N0- Currently receiving adjuvant Sutent 50 mg, 2 weeks on, 1 week off. CT FEB 2021 [Urology]-no evidence of any recurrence.  STABLE.   #I reviewed the data with regard to Sutent that shows unfortunately no survival difference between Sutent versus placebo.  I also discussed the recent data available regarding use of immunotherapy/pembrolizumab for high risk RCC.  Unfortunately the study still only shows improvement in disease-free survival at 2 years; but no overall survival data yet.  Immunotherapy is not approved by FDA.  #Patient will plan to finish total of 1 year of Sutent this September.  And continue surveillance.  # Hypertension-from Sutent; STABLE.   133/90s-recommend close monitoring at home.  # LFTs-AST ALT 450-80s-STABLE; G-1; -normal bilirubin likely secondary to Sutent.  Monitor closely asymptomatic.  # Hypothyroidism-  on synthroid 100 mcg/d; May 2021-TSH-Normal.  STABLE.   # DISPOSITION: # 6 weeks- MD-labs- cbc/cmp Dr.B

## 2020-02-19 NOTE — Progress Notes (Unsigned)
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 Sniskinski]; July CT 2020- mid anterior left kidney measuring 8.6 x 8.1 x 9.4 cm; CT chest negative  #April 06, 2019-Sutent 50 mg 2 weeks on 1 week off.   # DM- 2- OHA/ HTN  DIAGNOSIS: Left kidney cancer  STAGE: 3       ;GOALS: Cure  CURRENT/MOST RECENT THERAPY: Sutent    Cancer of left kidney ([place])  02/26/2019 Initial Diagnosis   Cancer of left kidney ([place])      HISTORY OF PRESENTING ILLNESS:  Charles Mathis 49 y.o.  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    No nausea no vomiting pain abdominal pain.  No significant fatigue.  Chronic joint pains.  No rash.  No shortness of breath or cough  Review of Systems  Constitutional: Negative for chills, diaphoresis, fever, malaise/fatigue and weight loss.  HENT: Negative for nosebleeds and sore throat.   Eyes: Negative for double vision.  Respiratory: Negative for cough, hemoptysis, sputum production, shortness of breath and wheezing.   Cardiovascular: Negative for chest pain, palpitations, orthopnea and leg swelling.  Gastrointestinal: Negative for abdominal pain, blood in stool, constipation, diarrhea, heartburn, melena, nausea and vomiting.  Genitourinary: Negative for dysuria, frequency and urgency.  Musculoskeletal: Positive for joint pain (knees (chronic)). Negative for back pain and myalgias.  Skin: Negative.  Negative for itching and rash.  Neurological: Negative for dizziness, tingling, focal weakness, weakness and headaches.  Endo/Heme/Allergies: Does not bruise/bleed easily.  Psychiatric/Behavioral: Negative for depression.  The patient is not nervous/anxious and does not have insomnia.      MEDICAL HISTORY:  Past Medical History:  Diagnosis Date  . Allergy   . Diabetes mellitus without complication ([place])   . Hypertension   . Renal cancer, left ([place]) 01/2019   Left Nephrectomy    SURGICAL HISTORY: Past Surgical History:  Procedure Laterality Date  . CYSTOSCOPY  02/06/2019   Procedure: CYSTOSCOPY;  Surgeon: Billey Co, MD;  Location: [facility] ORS;  Service: Urology;;  . LAPAROSCOPIC NEPHRECTOMY, HAND ASSISTED Left 02/06/2019   Procedure: HAND ASSISTED LAPAROSCOPIC LEFT RADICAL NEPHRECTOMY;  Surgeon: Billey Co, MD;  Location: [facility] ORS;  Service: Urology;  Laterality: Left;  . NO PAST SURGERIES      SOCIAL HISTORY: Social History   Socioeconomic History  . Marital status: Married    Spouse name: Not on file  . Number of children: Not on file  . Years of education: Not on file  . Highest education level: Not on file  Occupational History  . Not on file  Tobacco Use  . Smoking status: Never Smoker  . Smokeless tobacco: Never Used  Vaping Use  . Vaping Use: Never used  Substance and Sexual Activity  . Alcohol use: No  . Drug use: No  . Sexual activity: Yes    Birth control/protection: Other-see comments    Comment: mutually monogamous relationship partner postmenopausal  Other Topics Concern  . Not on file  Social History Narrative   In [place]/ with wife; son Cherlyn Cushing syndrome]; no smoking/ no alcohol; works for [employer], museum/gallery.    Social Determinants of Health   Financial Resource Strain:   .  Difficulty of Paying Living Expenses:   Food Insecurity:   . Worried About Charity fundraiser in the Last Year:   . Arboriculturist in the Last Year:   Transportation Needs:   . Film/video editor (Medical):   Marland Kitchen Lack of Transportation (Non-Medical):   Physical Activity:   . Days of Exercise per Week:   . Minutes of Exercise per Session:   Stress:   . Feeling of Stress :    Social Connections:   . Frequency of Communication with Friends and Family:   . Frequency of Social Gatherings with Friends and Family:   . Attends Religious Services:   . Active Member of Clubs or Organizations:   . Attends Archivist Meetings:   Marland Kitchen Marital Status:   Intimate Partner Violence:   . Fear of Current or Ex-Partner:   . Emotionally Abused:   Marland Kitchen Physically Abused:   . Sexually Abused:     FAMILY HISTORY: Family History  Problem Relation Age of Onset  . Diabetes Mother   . Heart disease Mother   . Mental illness Mother   . Thyroid disease Mother   . Heart disease Father   . Diabetes Father   . Mental illness Father     ALLERGIES:  has No Known Allergies.  MEDICATIONS:  Current Outpatient Medications  Medication Sig Dispense Refill  . acetaminophen (TYLENOL) 325 MG tablet Take by mouth every 6 (six) hours as needed for headache.    Marland Kitchen amLODipine (NORVASC) 10 MG tablet Take 1 tablet (10 mg total) by mouth daily. 90 tablet 0  . aspirin EC 81 MG tablet Take 81 mg by mouth daily.    Marland Kitchen atorvastatin (LIPITOR) 40 MG tablet Take 1 tablet (40 mg total) by mouth daily. 30 tablet 2  . carvedilol (COREG) 25 MG tablet TAKE 1 TABLET TWICE A DAY WITH A MEAL 180 tablet 0  . chlorthalidone (HYGROTON) 25 MG tablet TAKE 1/2 TABLET BY MOUTH EVERY DAY 45 tablet 1  . fluticasone (FLONASE) 50 MCG/ACT nasal spray Place 2 sprays into both nostrils daily.    Marland Kitchen levothyroxine (SYNTHROID) 100 MCG tablet TAKE ONE TABLET BY MOUTH EVERY DAY 90 tablet 0  . metFORMIN (GLUCOPHAGE) 500 MG tablet Take 1 tablet (500 mg total) by mouth 2 (two) times daily with a meal. 180 tablet 0  . montelukast (SINGULAIR) 10 MG tablet TAKE ONE TABLET BY MOUTH EVERY DAY 90 tablet 0  . olmesartan (BENICAR) 40 MG tablet Take 1 tablet (40 mg total) by mouth daily. 90 tablet 0  . potassium chloride (KLOR-CON) 10 MEQ tablet Take 1 tablet (10 mEq total) by mouth 2 (two) times daily. 180 tablet 0  . SUNItinib  (SUTENT) 50 MG capsule Take 1 capsule (50 mg total) by mouth daily. One pill a day; 2 weeks-On and 1 week-OFF 14 capsule 6   No current facility-administered medications for this visit.    PHYSICAL EXAMINATION: ECOG PERFORMANCE STATUS: 0 - Asymptomatic  There were no vitals filed for this visit. There were no vitals filed for this visit.  Physical Exam Constitutional:      Comments: Unaccompanied, wearing mask.  HENT:     Head: Normocephalic and atraumatic.     Mouth/Throat:     Pharynx: No oropharyngeal exudate.  Eyes:     General: No scleral icterus.    Conjunctiva/sclera: Conjunctivae normal.  Cardiovascular:     Rate and Rhythm: Normal rate and regular rhythm.  Pulmonary:  Effort: Pulmonary effort is normal. No respiratory distress.     Breath sounds: Normal breath sounds. No wheezing.  Abdominal:     General: Bowel sounds are normal. There is no distension.     Palpations: Abdomen is soft. There is no mass.     Tenderness: There is no abdominal tenderness. There is no guarding or rebound.  Musculoskeletal:        General: No tenderness. Normal range of motion.     Cervical back: Normal range of motion and neck supple.  Skin:    General: Skin is warm and dry.  Neurological:     Mental Status: He is alert and oriented to person, place, and time.  Psychiatric:        Mood and Affect: Mood and affect normal.     LABORATORY DATA:  I have reviewed the data as listed Lab Results  Component Value Date   WBC 4.5 01/08/2020   HGB 13.4 01/08/2020   HCT 39.3 01/08/2020   MCV 85.4 01/08/2020   PLT 175 01/08/2020   Recent Labs    10/30/19 0922 11/27/19 0949 01/08/20 1003  NA 136 136 138  K 3.4* 3.2* 3.3*  CL 99 97* 99  CO2 26 26 26   GLUCOSE 119* 140* 114*  BUN 20 18 16   CREATININE 1.25* 1.24 1.30*  CALCIUM 9.2 9.2 9.0  GFRNONAA >60 >60 >60  GFRAA >60 >60 >60  PROT 8.1 8.2* 8.1  ALBUMIN 4.1 4.2 4.2  AST 59* 54* 69*  ALT 70* 60* 75*  ALKPHOS 63 65 59   BILITOT 0.8 0.9 0.8    RADIOGRAPHIC STUDIES: I have personally reviewed the radiological images as listed and agreed with the findings in the report. No results found.  ASSESSMENT & PLAN:  No problem-specific Assessment & Plan notes found for this encounter.  All questions were answered. The patient knows to call the clinic with any problems, questions or concerns.   Beckey Rutter, DNP, AGNP-C [facility] [place]: Dr. Rogue Bussing

## 2020-03-03 ENCOUNTER — Other Ambulatory Visit: Payer: Self-pay | Admitting: Family Medicine

## 2020-03-03 DIAGNOSIS — E1165 Type 2 diabetes mellitus with hyperglycemia: Secondary | ICD-10-CM

## 2020-03-03 DIAGNOSIS — E785 Hyperlipidemia, unspecified: Secondary | ICD-10-CM

## 2020-03-04 ENCOUNTER — Inpatient Hospital Stay (HOSPITAL_BASED_OUTPATIENT_CLINIC_OR_DEPARTMENT_OTHER): Payer: Self-pay | Admitting: Internal Medicine

## 2020-03-04 ENCOUNTER — Other Ambulatory Visit: Payer: Self-pay

## 2020-03-04 ENCOUNTER — Inpatient Hospital Stay: Payer: Self-pay | Attending: Internal Medicine

## 2020-03-04 ENCOUNTER — Encounter: Payer: Self-pay | Admitting: Internal Medicine

## 2020-03-04 DIAGNOSIS — C642 Malignant neoplasm of left kidney, except renal pelvis: Secondary | ICD-10-CM | POA: Insufficient documentation

## 2020-03-04 DIAGNOSIS — E039 Hypothyroidism, unspecified: Secondary | ICD-10-CM | POA: Insufficient documentation

## 2020-03-04 DIAGNOSIS — I10 Essential (primary) hypertension: Secondary | ICD-10-CM | POA: Insufficient documentation

## 2020-03-04 DIAGNOSIS — E119 Type 2 diabetes mellitus without complications: Secondary | ICD-10-CM | POA: Insufficient documentation

## 2020-03-04 DIAGNOSIS — Z7984 Long term (current) use of oral hypoglycemic drugs: Secondary | ICD-10-CM | POA: Insufficient documentation

## 2020-03-04 DIAGNOSIS — Z7982 Long term (current) use of aspirin: Secondary | ICD-10-CM | POA: Insufficient documentation

## 2020-03-04 DIAGNOSIS — Z79899 Other long term (current) drug therapy: Secondary | ICD-10-CM | POA: Insufficient documentation

## 2020-03-04 LAB — CBC WITH DIFFERENTIAL/PLATELET
Abs Immature Granulocytes: 0.01 10*3/uL (ref 0.00–0.07)
Basophils Absolute: 0 10*3/uL (ref 0.0–0.1)
Basophils Relative: 0 %
Eosinophils Absolute: 0.3 10*3/uL (ref 0.0–0.5)
Eosinophils Relative: 5 %
HCT: 38.6 % — ABNORMAL LOW (ref 39.0–52.0)
Hemoglobin: 13.1 g/dL (ref 13.0–17.0)
Immature Granulocytes: 0 %
Lymphocytes Relative: 27 %
Lymphs Abs: 1.3 10*3/uL (ref 0.7–4.0)
MCH: 29.5 pg (ref 26.0–34.0)
MCHC: 33.9 g/dL (ref 30.0–36.0)
MCV: 86.9 fL (ref 80.0–100.0)
Monocytes Absolute: 0.5 10*3/uL (ref 0.1–1.0)
Monocytes Relative: 10 %
Neutro Abs: 2.8 10*3/uL (ref 1.7–7.7)
Neutrophils Relative %: 58 %
Platelets: 147 10*3/uL — ABNORMAL LOW (ref 150–400)
RBC: 4.44 MIL/uL (ref 4.22–5.81)
RDW: 15.7 % — ABNORMAL HIGH (ref 11.5–15.5)
WBC: 4.9 10*3/uL (ref 4.0–10.5)
nRBC: 0 % (ref 0.0–0.2)

## 2020-03-04 LAB — COMPREHENSIVE METABOLIC PANEL
ALT: 56 U/L — ABNORMAL HIGH (ref 0–44)
AST: 56 U/L — ABNORMAL HIGH (ref 15–41)
Albumin: 4 g/dL (ref 3.5–5.0)
Alkaline Phosphatase: 60 U/L (ref 38–126)
Anion gap: 10 (ref 5–15)
BUN: 21 mg/dL — ABNORMAL HIGH (ref 6–20)
CO2: 27 mmol/L (ref 22–32)
Calcium: 8.9 mg/dL (ref 8.9–10.3)
Chloride: 101 mmol/L (ref 98–111)
Creatinine, Ser: 1.3 mg/dL — ABNORMAL HIGH (ref 0.61–1.24)
GFR calc Af Amer: >60 mL/min (ref >60)
GFR calc non Af Amer: >60 mL/min (ref >60)
Glucose, Bld: 114 mg/dL — ABNORMAL HIGH (ref 70–99)
Potassium: 3.5 mmol/L (ref 3.5–5.1)
Sodium: 138 mmol/L (ref 135–145)
Total Bilirubin: 0.8 mg/dL (ref 0.3–1.2)
Total Protein: 7.7 g/dL (ref 6.5–8.1)

## 2020-03-04 NOTE — Assessment & Plan Note (Addendum)
#  Kidney cancer-clear cell carcinoma.  Stage III, pT3N0- Currently receiving adjuvant Sutent 50 mg, 2 weeks on, 1 week off. CT FEB 2021 [Urology]-no evidence of any recurrence. STABLE.    # continue Sutent- 1 more month; stop ins september 2021; will get baseline scan. awaiting CT scan with urology next week.   # Hypertension-from Sutent; STABLE.   133/90s-recommend close monitoring at home.  # LFTs-AST ALT- 55- STABLE; G-1; secondary to Sutent.  Monitor closely asymptomatic.  # Hypothyroidism-  on synthroid 100 mcg/d; May 2021-TSH-Normal. STABLE.   # COVID BOOSTER: Discussed given patient's diagnosis and other comorbidities/therapies-patient would be considered immunocompromised.  As per CDC recommendation/FDA approval-I would recommend booster vaccine.  Patient is interested.   # DISPOSITION: # 6 weeks- MD-labs- cbc/cmp Dr.B

## 2020-03-04 NOTE — Progress Notes (Signed)
[place] OFFICE PROGRESS NOTE  Patient Care Team: Malf[place], Lupita Raider, FNP as PCP - General (Family Medicine)  CHIEF COMPLAINTS/PURPOSE OF CONSULTATION: Kidney cancer   #  Oncology History Overview Note  #July 2020-left kidney cancer-clear cell carcinoma; grade 2.  PT3a/stage III status post radical nephrectomy; [Dr. Sniskinski]; July CT 2020- mid anterior left kidney measuring 8.6 x 8.1 x 9.4 cm; CT chest negative  #April 06, 2019-Sutent 50 mg 2 weeks on 1 week off.   # DM- 2- OHA/ HTN  DIAGNOSIS: Left kidney cancer  STAGE: 3       ;GOALS: Cure  CURRENT/MOST RECENT THERAPY: Sutent    Cancer of left kidney ([place])  02/26/2019 Initial Diagnosis   Cancer of left kidney ([place])      HISTORY OF PRESENTING ILLNESS:  Charles Mathis 49 y.o.  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    No nausea no vomiting.  No abdominal pain.  No worsening fatigue.  No joint pains but no headaches.  Review of Systems  Constitutional: Negative for chills, diaphoresis, fever, malaise/fatigue and weight loss.  HENT: Negative for nosebleeds and sore throat.   Eyes: Negative for double vision.  Respiratory: Negative for cough, hemoptysis, sputum production, shortness of breath and wheezing.   Cardiovascular: Negative for chest pain, palpitations, orthopnea and leg swelling.  Gastrointestinal: Negative for abdominal pain, blood in stool, constipation, diarrhea, heartburn, melena, nausea and vomiting.  Genitourinary: Negative for dysuria, frequency and urgency.  Musculoskeletal: Positive for joint pain (knees (chronic)). Negative for back pain and myalgias.  Skin: Negative.  Negative for itching and rash.  Neurological: Negative for dizziness, tingling, focal weakness, weakness and headaches.  Endo/Heme/Allergies: Does not bruise/bleed easily.  Psychiatric/Behavioral: Negative for depression. The patient is not nervous/anxious  and does not have insomnia.      MEDICAL HISTORY:  Past Medical History:  Diagnosis Date  . Allergy   . Diabetes mellitus without complication ([place])   . Hypertension   . Renal cancer, left ([place]) 01/2019   Left Nephrectomy    SURGICAL HISTORY: Past Surgical History:  Procedure Laterality Date  . CYSTOSCOPY  02/06/2019   Procedure: CYSTOSCOPY;  Surgeon: Billey Co, MD;  Location: [facility] ORS;  Service: Urology;;  . LAPAROSCOPIC NEPHRECTOMY, HAND ASSISTED Left 02/06/2019   Procedure: HAND ASSISTED LAPAROSCOPIC LEFT RADICAL NEPHRECTOMY;  Surgeon: Billey Co, MD;  Location: [facility] ORS;  Service: Urology;  Laterality: Left;  . NO PAST SURGERIES      SOCIAL HISTORY: Social History   Socioeconomic History  . Marital status: Married    Spouse name: Not on file  . Number of children: Not on file  . Years of education: Not on file  . Highest education level: Not on file  Occupational History  . Not on file  Tobacco Use  . Smoking status: Never Smoker  . Smokeless tobacco: Never Used  Vaping Use  . Vaping Use: Never used  Substance and Sexual Activity  . Alcohol use: No  . Drug use: No  . Sexual activity: Yes    Birth control/protection: Other-see comments    Comment: mutually monogamous relationship partner postmenopausal  Other Topics Concern  . Not on file  Social History Narrative   In [place]/ with wife; son Cherlyn Cushing syndrome]; no smoking/ no alcohol; works for [employer].    Social Determinants of Health   Financial Resource Strain:   . Difficulty of Paying Living Expenses:  Not on file  Food Insecurity:   . Worried About Charity fundraiser in the Last Year: Not on file  . Ran Out of Food in the Last Year: Not on file  Transportation Needs:   . Lack of Transportation (Medical): Not on file  . Lack of Transportation (Non-Medical): Not on file  Physical Activity:   . Days of Exercise per Week: Not on file  . Minutes of Exercise per Session: Not  on file  Stress:   . Feeling of Stress : Not on file  Social Connections:   . Frequency of Communication with Friends and Family: Not on file  . Frequency of Social Gatherings with Friends and Family: Not on file  . Attends Religious Services: Not on file  . Active Member of Clubs or Organizations: Not on file  . Attends Archivist Meetings: Not on file  . Marital Status: Not on file  Intimate Partner Violence:   . Fear of Current or Ex-Partner: Not on file  . Emotionally Abused: Not on file  . Physically Abused: Not on file  . Sexually Abused: Not on file    FAMILY HISTORY: Family History  Problem Relation Age of Onset  . Diabetes Mother   . Heart disease Mother   . Mental illness Mother   . Thyroid disease Mother   . Heart disease Father   . Diabetes Father   . Mental illness Father     ALLERGIES:  has No Known Allergies.  MEDICATIONS:  Current Outpatient Medications  Medication Sig Dispense Refill  . acetaminophen (TYLENOL) 325 MG tablet Take by mouth every 6 (six) hours as needed for headache.    Marland Kitchen amLODipine (NORVASC) 10 MG tablet Take 1 tablet (10 mg total) by mouth daily. 90 tablet 0  . aspirin EC 81 MG tablet Take 81 mg by mouth daily.    Marland Kitchen atorvastatin (LIPITOR) 40 MG tablet Take 1 tablet (40 mg total) by mouth daily. 90 tablet 0  . carvedilol (COREG) 25 MG tablet TAKE 1 TABLET TWICE A DAY WITH A MEAL 180 tablet 0  . chlorthalidone (HYGROTON) 25 MG tablet TAKE 1/2 TABLET BY MOUTH EVERY DAY 45 tablet 1  . fluticasone (FLONASE) 50 MCG/ACT nasal spray Place 2 sprays into both nostrils daily.    Marland Kitchen levothyroxine (SYNTHROID) 100 MCG tablet TAKE ONE TABLET BY MOUTH EVERY DAY 90 tablet 0  . metFORMIN (GLUCOPHAGE) 500 MG tablet Take 1 tablet (500 mg total) by mouth 2 (two) times daily with a meal. 180 tablet 0  . montelukast (SINGULAIR) 10 MG tablet TAKE ONE TABLET BY MOUTH EVERY DAY 90 tablet 0  . olmesartan (BENICAR) 40 MG tablet Take 1 tablet (40 mg total) by  mouth daily. 90 tablet 0  . potassium chloride (KLOR-CON) 10 MEQ tablet Take 1 tablet (10 mEq total) by mouth 2 (two) times daily. 180 tablet 0  . SUNItinib (SUTENT) 50 MG capsule Take 1 capsule (50 mg total) by mouth daily. One pill a day; 2 weeks-On and 1 week-OFF 14 capsule 6   No current facility-administered medications for this visit.    PHYSICAL EXAMINATION: ECOG PERFORMANCE STATUS: 0 - Asymptomatic  Vitals:   03/04/20 1034  BP: 137/88  Pulse: 73  Resp: 16  Temp: 98.9 F (37.2 C)  SpO2: 97%   Filed Weights   03/04/20 1034  Weight: 248 lb (112.5 kg)    Physical Exam Constitutional:      Comments: Unaccompanied, wearing mask.  HENT:  Head: Normocephalic and atraumatic.     Mouth/Throat:     Pharynx: No oropharyngeal exudate.  Eyes:     General: No scleral icterus.    Conjunctiva/sclera: Conjunctivae normal.  Cardiovascular:     Rate and Rhythm: Normal rate and regular rhythm.  Pulmonary:     Effort: Pulmonary effort is normal. No respiratory distress.     Breath sounds: Normal breath sounds. No wheezing.  Abdominal:     General: Bowel sounds are normal. There is no distension.     Palpations: Abdomen is soft. There is no mass.     Tenderness: There is no abdominal tenderness. There is no guarding or rebound.  Musculoskeletal:        General: No tenderness. Normal range of motion.     Cervical back: Normal range of motion and neck supple.  Skin:    General: Skin is warm and dry.  Neurological:     Mental Status: He is alert and oriented to person, place, and time.  Psychiatric:        Mood and Affect: Mood and affect normal.     LABORATORY DATA:  I have reviewed the data as listed Lab Results  Component Value Date   WBC 4.9 03/04/2020   HGB 13.1 03/04/2020   HCT 38.6 (L) 03/04/2020   MCV 86.9 03/04/2020   PLT 147 (L) 03/04/2020   Recent Labs    11/27/19 0949 01/08/20 1003 03/04/20 1019  NA 136 138 138  K 3.2* 3.3* 3.5  CL 97* 99 101  CO2  26 26 27   GLUCOSE 140* 114* 114*  BUN 18 16 21*  CREATININE 1.24 1.30* 1.30*  CALCIUM 9.2 9.0 8.9  GFRNONAA >60 >60 >60  GFRAA >60 >60 >60  PROT 8.2* 8.1 7.7  ALBUMIN 4.2 4.2 4.0  AST 54* 69* 56*  ALT 60* 75* 56*  ALKPHOS 65 59 60  BILITOT 0.9 0.8 0.8    RADIOGRAPHIC STUDIES: I have personally reviewed the radiological images as listed and agreed with the findings in the report. No results found.  ASSESSMENT & PLAN:  Cancer of left kidney ([place]) #Kidney cancer-clear cell carcinoma.  Stage III, pT3N0- Currently receiving adjuvant Sutent 50 mg, 2 weeks on, 1 week off. CT FEB 2021 [Urology]-no evidence of any recurrence. STABLE.    # continue Sutent- 1 more month; stop ins september 2021; will get baseline scan. awaiting CT scan with urology next week.   # Hypertension-from Sutent; STABLE.   133/90s-recommend close monitoring at home.  # LFTs-AST ALT- 55- STABLE; G-1; secondary to Sutent.  Monitor closely asymptomatic.  # Hypothyroidism-  on synthroid 100 mcg/d; May 2021-TSH-Normal. STABLE.   # COVID BOOSTER: Discussed given patient's diagnosis and other comorbidities/therapies-patient would be considered immunocompromised.  As per CDC recommendation/FDA approval-I would recommend booster vaccine.  Patient is interested.   # DISPOSITION: # 6 weeks- MD-labs- cbc/cmp Dr.B

## 2020-03-11 ENCOUNTER — Other Ambulatory Visit: Payer: Self-pay

## 2020-03-11 ENCOUNTER — Ambulatory Visit
Admission: RE | Admit: 2020-03-11 | Discharge: 2020-03-11 | Disposition: A | Discharge status: Home / Self Care | Payer: Self-pay | Source: Ambulatory Visit | Attending: Urology | Admitting: Urology

## 2020-03-11 DIAGNOSIS — C642 Malignant neoplasm of left kidney, except renal pelvis: Secondary | ICD-10-CM | POA: Insufficient documentation

## 2020-03-11 MED ORDER — IOHEXOL 300 MG/ML  SOLN
100.0000 mL | Freq: Once | INTRAMUSCULAR | Status: AC | PRN
  Administered 2020-03-11: 100 mL via INTRAVENOUS

## 2020-03-16 ENCOUNTER — Encounter: Payer: Self-pay | Admitting: Urology

## 2020-03-16 ENCOUNTER — Ambulatory Visit (INDEPENDENT_AMBULATORY_CARE_PROVIDER_SITE_OTHER): Payer: Self-pay | Admitting: Urology

## 2020-03-16 ENCOUNTER — Other Ambulatory Visit: Payer: Self-pay

## 2020-03-16 VITALS — BP 126/77 | HR 72 | Ht 67.0 in | Wt 248.0 lb

## 2020-03-16 DIAGNOSIS — C642 Malignant neoplasm of left kidney, except renal pelvis: Secondary | ICD-10-CM

## 2020-03-16 NOTE — Progress Notes (Signed)
   03/16/2020 8:53 AM   Charles Mathis [birth year] [id number]  Reason for visit: Follow up kidney cancer  HPI: I saw Charles Mathis back in urology clinic today for follow-up of kidney cancer. He is a healthy 49 year old male that presented to the ED on 02/05/2019 with gross hematuria, flank pain, clot retention, and an enhancing left 10 cm renal mass. He underwent uncomplicated left laparoscopic hand-assisted radical nephrectomy on 02/06/2019, as well as cystoscopy that showed no concerning bladder lesions.   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  He received one year of adjuvent sutent, and continues to follow with medical oncology.  He denies any flank pain, gross hematuria, or weight loss.  I personally reviewed his recent imaging with CT of the chest, abdomen, and pelvis on 03/11/20, there is no evidence of recurrence or metastatic disease.  Most recent renal function is stable with creatinine 1.3, EGFR greater than 60.   We had a long conversation about his history of RCC and high risk for recurrence.  We discussed approximately 20 to 30% recurrence rate for patients with high risk stage III kidney cancer.  We will continue imaging every 6 months with CT chest, abdomen, pelvis for the first 3 years, then space to annual imaging to year 5.  He continues to get lab work and close follow-up with oncology regarding his adjuvant sunitinib.  RTC 6 months with CT chest, abdomen, pelvis Continue following with medical oncology   I spent 30 total minutes on the day of the encounter including pre-visit review of the medical record, face-to-face time with the patient, and post visit ordering of labs/imaging/tests.  Billey Co, [facility] [address] [place], [place] [address] [phone]

## 2020-03-25 ENCOUNTER — Ambulatory Visit (INDEPENDENT_AMBULATORY_CARE_PROVIDER_SITE_OTHER): Payer: Self-pay | Admitting: Family Medicine

## 2020-03-25 ENCOUNTER — Other Ambulatory Visit: Payer: Self-pay

## 2020-03-25 ENCOUNTER — Encounter: Payer: Self-pay | Admitting: Family Medicine

## 2020-03-25 VITALS — BP 115/71 | HR 82 | Temp 98.8°F | Resp 20 | Ht 67.0 in | Wt 250.4 lb

## 2020-03-25 DIAGNOSIS — R252 Cramp and spasm: Secondary | ICD-10-CM

## 2020-03-25 DIAGNOSIS — E1165 Type 2 diabetes mellitus with hyperglycemia: Secondary | ICD-10-CM

## 2020-03-25 DIAGNOSIS — I10 Essential (primary) hypertension: Secondary | ICD-10-CM

## 2020-03-25 LAB — POCT GLYCOSYLATED HEMOGLOBIN (HGB A1C): Hemoglobin A1C: 6.5 % — AB (ref 4.0–5.6)

## 2020-03-25 MED ORDER — CYCLOBENZAPRINE HCL 10 MG PO TABS: 5.0000 mg | ORAL_TABLET | Freq: Three times a day (TID) | ORAL | 0 refills | Status: DC | PRN

## 2020-03-25 NOTE — Assessment & Plan Note (Signed)
Well-controlledDM with A1c 6.5% Worsening control from 6.1% on 09/25/2019 and goal A1c < 7.0%.  Plan:  1. Continue current therapy: metformin 500mg  BID WC 2. Encourage improved lifestyle: - low carb/low glycemic diet reinforced prior education - Increase physical activity to 30 minutes most days of the week.  Explained that increased physical activity increases body's use of sugar for energy. 3. Check fasting am CBG and log these.  Bring log to next visit for review 4. Continue ARB and Statin 5. Advised to schedule DM ophtho exam, send record. 6. Follow-up 6 months

## 2020-03-25 NOTE — Progress Notes (Signed)
Subjective:    Patient ID: Charles Mathis, male    DOB: 12/30/1970, 49 y.o.   MRN: [id number]  Charles Mathis is a 49 y.o. male presenting on 03/25/2020 for Diabetes, Hypertension, and Back Pain   HPI   Mr. Montanari presents to clinic for follow up on his diabetes, hypertension and concerns for right side lower back cramp/spasm that has been present for a few weeks.  Reports has discomfort in the right lower back when is stretching to reach anything, sometimes with movement that is twisting his trunk/lower back.  Has not found any relief for this as of yet.  Denies changes in bowel/bladder function, saddle anesthesia, foot drop, numbness, tingling or weakness.  Hypertension - He is not checking BP at home or outside of clinic.    - Current medications: amlodipine 10mg  daily, carvedilol 25mg  BID, chlorthalidone 25mg  daily and olmesartan 40mg  daily, tolerating well without side effects - He is not currently symptomatic. - Pt denies headache, lightheadedness, dizziness, changes in vision, chest tightness/pressure, palpitations, leg swelling, sudden loss of speech or loss of consciousness. - He  reports no regular exercise routine. - His diet is high in salt, high in fat, and high in carbohydrates.  Diabetes Pt presents today for follow up Type 2 Diabetes Mellitus.  He/she (caps): He ACTION; IS/IS NOT: is not checking AM CBG at home. -Current diabetic medications include: metformin 500mg  BID WC -ACTION; IS/IS NOT: is not currently symptomatic -Actions; denies/reports/admits to: denies polydipsia, polyphagia, polyuria, headaches, diaphoresis, shakiness, chills, pain, numbness or tingling in extremities or changes in vision -Clinical course has been stable -Reports no structured exercise routine -Diet is high in salt, high in fat, and high in carbohydrates  PREVENTION Eye exam current (within 1 year) Due, encouraged Foot exam current (within 1 year) Due, at next visit Lipid/ASCVD risk  reduction - on statin: YES/NO: Yes  Kidney Protection (On ACE/ARB)? YES/NO: Yes   Depression screen [facility] 2/9 05/08/2019 04/25/2017  Decreased Interest 0 0  Down, Depressed, Hopeless 0 0  PHQ - 2 Score 0 0    Social History   Tobacco Use  . Smoking status: Never Smoker  . Smokeless tobacco: Never Used  Vaping Use  . Vaping Use: Never used  Substance Use Topics  . Alcohol use: No  . Drug use: No    Review of Systems  Constitutional: Negative.   HENT: Negative.   Eyes: Negative.   Respiratory: Negative.   Cardiovascular: Negative.   Gastrointestinal: Negative.   Endocrine: Negative.   Genitourinary: Negative.   Musculoskeletal: Positive for myalgias. Negative for arthralgias, back pain, gait problem, joint swelling, neck pain and neck stiffness.  Skin: Negative.   Allergic/Immunologic: Negative.   Neurological: Negative.   Hematological: Negative.   Psychiatric/Behavioral: Negative.    Per HPI unless specifically indicated above     Objective:    BP 115/71 (BP Location: Left Arm, Patient Position: Sitting, Cuff Size: Large)   Pulse 82   Temp 98.8 F (37.1 C) (Oral)   Resp 20   Ht 5\' 7"  (1.702 m)   Wt 250 lb 6.4 oz (113.6 kg)   SpO2 97%   BMI 39.22 kg/m   Wt Readings from Last 3 Encounters:  03/25/20 250 lb 6.4 oz (113.6 kg)  03/16/20 248 lb (112.5 kg)  03/04/20 248 lb (112.5 kg)    Physical Exam Vitals reviewed.  Constitutional:      General: He is not in acute distress.    Appearance: Normal  appearance. He is well-developed and well-groomed. He is obese. He is not ill-appearing or toxic-appearing.  HENT:     Head: Normocephalic and atraumatic.     Nose:     Comments: Lizbeth Bark is in place, covering mouth and nose. Eyes:     General:        Right eye: No discharge.        Left eye: No discharge.     Extraocular Movements: Extraocular movements intact.     Conjunctiva/sclera: Conjunctivae normal.     Pupils: Pupils are equal, round, and reactive to  light.  Cardiovascular:     Rate and Rhythm: Normal rate and regular rhythm.     Pulses: Normal pulses.     Heart sounds: Normal heart sounds. No murmur heard.  No friction rub. No gallop.   Pulmonary:     Effort: Pulmonary effort is normal. No respiratory distress.     Breath sounds: Normal breath sounds.  Musculoskeletal:        General: Tenderness present.     Cervical back: Normal.     Thoracic back: Normal.     Lumbar back: Spasms and tenderness present. No swelling or deformity. Normal range of motion. Negative right straight leg raise test and negative left straight leg raise test.       Back:     Right lower leg: No edema.     Left lower leg: No edema.  Skin:    General: Skin is warm and dry.     Capillary Refill: Capillary refill takes less than 2 seconds.  Neurological:     General: No focal deficit present.     Mental Status: He is alert and oriented to person, place, and time.  Psychiatric:        Attention and Perception: Attention and perception normal.        Mood and Affect: Mood and affect normal.        Speech: Speech normal.        Behavior: Behavior normal. Behavior is cooperative.        Thought Content: Thought content normal.        Cognition and Memory: Cognition and memory normal.    Results for orders placed or performed in visit on 03/25/20  POCT glycosylated hemoglobin (Hb A1C)  Result Value Ref Range   Hemoglobin A1C 6.5 (A) 4.0 - 5.6 %   HbA1c POC (<> result, manual entry)     HbA1c, POC (prediabetic range)     HbA1c, POC (controlled diabetic range)        Assessment & Plan:   Problem List Items Addressed This Visit      Cardiovascular and Mediastinum   Hypertension    Stable and well controlled hypertension.  BP is at goal < 130/80.  Pt is working on lifestyle modifications.  Taking medications tolerating well without side effects.   Plan: 1. Continue taking amlodipine 10mg  daily, carvedilol 25mg  BID, chlorthalidone 25mg  daily, and  olmesartan 40mg  daily 2. Obtain labs before next visit  3. Encouraged heart healthy diet and increasing exercise to 30 minutes most days of the week, going no more than 2 days in a row without exercise. 4. Check BP 1-2 x per week at home, keep log, and bring to clinic at next appointment. 5. Follow up 6 months.           Endocrine   T2DM (type 2 diabetes mellitus) ([place]) - Primary    Well-controlledDM with A1c 6.5% Worsening  control from 6.1% on 09/25/2019 and goal A1c < 7.0%.  Plan:  1. Continue current therapy: metformin 500mg  BID WC 2. Encourage improved lifestyle: - low carb/low glycemic diet reinforced prior education - Increase physical activity to 30 minutes most days of the week.  Explained that increased physical activity increases body's use of sugar for energy. 3. Check fasting am CBG and log these.  Bring log to next visit for review 4. Continue ARB and Statin 5. Advised to schedule DM ophtho exam, send record. 6. Follow-up 6 months       Relevant Orders   POCT glycosylated hemoglobin (Hb A1C) (Completed)     Other   Spasm    Right side LBP with muscle cramp/spasm.  Notices more when is bending/twisting trunk and lower back.  Has gone on for a few weeks and has not gotten better.  Denies changes in bowel/bladder, saddle anesthesia, numbness, tingling, weakness.  Plan: 1. Discussed soft tissue massage coupled with muscle relaxer to help calm down the cramp/spasm 2. Begin cyclobenzaprine 5-10mg  TID PRN for cramp/spasm 3. RTC in 2-4 weeks if symptoms worsen or fail to improve      Relevant Medications   cyclobenzaprine (FLEXERIL) 10 MG tablet      Meds ordered this encounter  Medications  . cyclobenzaprine (FLEXERIL) 10 MG tablet    Sig: Take 0.5-1 tablets (5-10 mg total) by mouth 3 (three) times daily as needed for muscle spasms.    Dispense:  30 tablet    Refill:  0   Follow up plan: Return in about 6 months (around 09/22/2020) for HTN, DM F/U.   Harlin Rain, [place] [facility] Nurse Practitioner [facility] 03/25/2020, 12:54 PM

## 2020-03-25 NOTE — Assessment & Plan Note (Signed)
Stable and well controlled hypertension.  BP is at goal < 130/80.  Pt is working on lifestyle modifications.  Taking medications tolerating well without side effects.   Plan: 1. Continue taking amlodipine 10mg  daily, carvedilol 25mg  BID, chlorthalidone 25mg  daily, and olmesartan 40mg  daily 2. Obtain labs before next visit  3. Encouraged heart healthy diet and increasing exercise to 30 minutes most days of the week, going no more than 2 days in a row without exercise. 4. Check BP 1-2 x per week at home, keep log, and bring to clinic at next appointment. 5. Follow up 6 months.

## 2020-03-25 NOTE — Assessment & Plan Note (Signed)
Right side LBP with muscle cramp/spasm.  Notices more when is bending/twisting trunk and lower back.  Has gone on for a few weeks and has not gotten better.  Denies changes in bowel/bladder, saddle anesthesia, numbness, tingling, weakness.  Plan: 1. Discussed soft tissue massage coupled with muscle relaxer to help calm down the cramp/spasm 2. Begin cyclobenzaprine 5-10mg  TID PRN for cramp/spasm 3. RTC in 2-4 weeks if symptoms worsen or fail to improve

## 2020-03-25 NOTE — Patient Instructions (Signed)
Continue your medications as prescribed.  I have sent in a prescription for cyclobenzaprine 10mg  to take 1/2 to 1 tablet up to 3x per day as needed for right sided muscle cramp/spasm.  Can perform soft tissue massage to help the muscle relax as well.  These can sometimes take a few weeks to fully resolve.  Try to get exercise a minimum of 30 minutes per day at least 5 days per week as well as  adequate water intake all while measuring blood pressure a few times per week.  Keep a blood pressure log and bring back to clinic at your next visit.  If your readings are consistently over 130/80 to contact our office/send me a MyChart message and we will see you sooner.  Can try DASH and Mediterranean diet options, avoiding processed foods, lowering sodium intake, avoiding pork products, and eating a plant based diet for optimal health.  You can learn more information online about your diabetes at American Diabetes Association: http://www.diabetes.org/ - General self-care (diet, medications, blood sugar checks). - Diet recommendations - There are even recipes available for you to look at and try.  We will plan to see you back in 6 months for diabetes and hypertension follow up visit  You will receive a survey after today's visit either digitally by e-mail or paper by [place] mail. Your experiences and feedback matter to [place].  Please respond so we know how we are doing as we provide care for you.  Call us with any questions/concerns/needs.  It is my goal to be available to you for your health concerns.  Thanks for choosing me to be a partner in your healthcare needs!  Harlin Rain, FNP-C Family Nurse Practitioner [facility] Phone: [phone]

## 2020-04-15 ENCOUNTER — Inpatient Hospital Stay: Payer: Self-pay | Attending: Internal Medicine

## 2020-04-15 ENCOUNTER — Other Ambulatory Visit: Payer: Self-pay

## 2020-04-15 ENCOUNTER — Encounter: Payer: Self-pay | Admitting: Internal Medicine

## 2020-04-15 ENCOUNTER — Inpatient Hospital Stay (HOSPITAL_BASED_OUTPATIENT_CLINIC_OR_DEPARTMENT_OTHER): Payer: Self-pay | Admitting: Internal Medicine

## 2020-04-15 DIAGNOSIS — E119 Type 2 diabetes mellitus without complications: Secondary | ICD-10-CM | POA: Insufficient documentation

## 2020-04-15 DIAGNOSIS — E039 Hypothyroidism, unspecified: Secondary | ICD-10-CM | POA: Insufficient documentation

## 2020-04-15 DIAGNOSIS — C642 Malignant neoplasm of left kidney, except renal pelvis: Secondary | ICD-10-CM

## 2020-04-15 DIAGNOSIS — Z7984 Long term (current) use of oral hypoglycemic drugs: Secondary | ICD-10-CM | POA: Insufficient documentation

## 2020-04-15 DIAGNOSIS — Z905 Acquired absence of kidney: Secondary | ICD-10-CM | POA: Insufficient documentation

## 2020-04-15 DIAGNOSIS — Z79899 Other long term (current) drug therapy: Secondary | ICD-10-CM | POA: Insufficient documentation

## 2020-04-15 DIAGNOSIS — Z7982 Long term (current) use of aspirin: Secondary | ICD-10-CM | POA: Insufficient documentation

## 2020-04-15 DIAGNOSIS — I10 Essential (primary) hypertension: Secondary | ICD-10-CM | POA: Insufficient documentation

## 2020-04-15 LAB — CBC WITH DIFFERENTIAL/PLATELET
Abs Immature Granulocytes: 0.01 10*3/uL (ref 0.00–0.07)
Basophils Absolute: 0 10*3/uL (ref 0.0–0.1)
Basophils Relative: 0 %
Eosinophils Absolute: 0.3 10*3/uL (ref 0.0–0.5)
Eosinophils Relative: 6 %
HCT: 38.4 % — ABNORMAL LOW (ref 39.0–52.0)
Hemoglobin: 13 g/dL (ref 13.0–17.0)
Immature Granulocytes: 0 %
Lymphocytes Relative: 31 %
Lymphs Abs: 1.3 10*3/uL (ref 0.7–4.0)
MCH: 29.5 pg (ref 26.0–34.0)
MCHC: 33.9 g/dL (ref 30.0–36.0)
MCV: 87.3 fL (ref 80.0–100.0)
Monocytes Absolute: 0.6 10*3/uL (ref 0.1–1.0)
Monocytes Relative: 14 %
Neutro Abs: 2.1 10*3/uL (ref 1.7–7.7)
Neutrophils Relative %: 49 %
Platelets: 150 10*3/uL (ref 150–400)
RBC: 4.4 MIL/uL (ref 4.22–5.81)
RDW: 16.4 % — ABNORMAL HIGH (ref 11.5–15.5)
WBC: 4.3 10*3/uL (ref 4.0–10.5)
nRBC: 0 % (ref 0.0–0.2)

## 2020-04-15 LAB — COMPREHENSIVE METABOLIC PANEL
ALT: 52 U/L — ABNORMAL HIGH (ref 0–44)
AST: 47 U/L — ABNORMAL HIGH (ref 15–41)
Albumin: 3.6 g/dL (ref 3.5–5.0)
Alkaline Phosphatase: 53 U/L (ref 38–126)
Anion gap: 11 (ref 5–15)
BUN: 20 mg/dL (ref 6–20)
CO2: 27 mmol/L (ref 22–32)
Calcium: 8.9 mg/dL (ref 8.9–10.3)
Chloride: 101 mmol/L (ref 98–111)
Creatinine, Ser: 1.5 mg/dL — ABNORMAL HIGH (ref 0.61–1.24)
GFR calc Af Amer: >60 mL/min (ref >60)
GFR calc non Af Amer: 54 mL/min — ABNORMAL LOW (ref >60)
Glucose, Bld: 130 mg/dL — ABNORMAL HIGH (ref 70–99)
Potassium: 3.6 mmol/L (ref 3.5–5.1)
Sodium: 139 mmol/L (ref 135–145)
Total Bilirubin: 1.1 mg/dL (ref 0.3–1.2)
Total Protein: 6.8 g/dL (ref 6.5–8.1)

## 2020-04-15 NOTE — Progress Notes (Signed)
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 Sniskinski]; July CT 2020- mid anterior left kidney measuring 8.6 x 8.1 x 9.4 cm; CT chest negative  #April 06, 2019-Sutent 50 mg 2 weeks on 1 week off.   # DM- 2- OHA/ HTN  DIAGNOSIS: Left kidney cancer  STAGE: 3       ;GOALS: Cure  CURRENT/MOST RECENT THERAPY: Sutent    Cancer of left kidney ([place])  02/26/2019 Initial Diagnosis   Cancer of left kidney ([place])      HISTORY OF PRESENTING ILLNESS:  Charles Mathis 49 y.o.  male history of stage III clear cell carcinoma currently on adjuvant Sutent is here for follow-up/review results of CT scan.  Patient continues to be on Sutent; as he is trying to finish off his maintenance.  Otherwise denies any nausea vomiting.  Denies abdominal pain.  No worsening fatigue.  No worsening joint pains or headaches.  Review of Systems  Constitutional: Negative for chills, diaphoresis, fever, malaise/fatigue and weight loss.  HENT: Negative for nosebleeds and sore throat.   Eyes: Negative for double vision.  Respiratory: Negative for cough, hemoptysis, sputum production, shortness of breath and wheezing.   Cardiovascular: Negative for chest pain, palpitations, orthopnea and leg swelling.  Gastrointestinal: Negative for abdominal pain, blood in stool, constipation, diarrhea, heartburn, melena, nausea and vomiting.  Genitourinary: Negative for dysuria, frequency and urgency.  Musculoskeletal: Positive for joint pain (knees (chronic)). Negative for back pain and myalgias.  Skin: Negative.  Negative for itching and rash.  Neurological: Negative for dizziness, tingling, focal weakness, weakness and headaches.   Endo/Heme/Allergies: Does not bruise/bleed easily.  Psychiatric/Behavioral: Negative for depression. The patient is not nervous/anxious and does not have insomnia.      MEDICAL HISTORY:  Past Medical History:  Diagnosis Date  . Allergy   . Diabetes mellitus without complication ([place])   . Hypertension   . Renal cancer, left ([place]) 01/2019   Left Nephrectomy    SURGICAL HISTORY: Past Surgical History:  Procedure Laterality Date  . CYSTOSCOPY  02/06/2019   Procedure: CYSTOSCOPY;  Surgeon: Billey Co, MD;  Location: [facility] ORS;  Service: Urology;;  . LAPAROSCOPIC NEPHRECTOMY, HAND ASSISTED Left 02/06/2019   Procedure: HAND ASSISTED LAPAROSCOPIC LEFT RADICAL NEPHRECTOMY;  Surgeon: Billey Co, MD;  Location: [facility] ORS;  Service: Urology;  Laterality: Left;  . NO PAST SURGERIES      SOCIAL HISTORY: Social History   Socioeconomic History  . Marital status: Married    Spouse name: Not on file  . Number of children: Not on file  . Years of education: Not on file  . Highest education level: Not on file  Occupational History  . Not on file  Tobacco Use  . Smoking status: Never Smoker  . Smokeless tobacco: Never Used  Vaping Use  . Vaping Use: Never used  Substance and Sexual Activity  . Alcohol use: No  . Drug use: No  . Sexual activity: Yes    Birth control/protection: Other-see comments    Comment: mutually monogamous relationship partner postmenopausal  Other Topics Concern  . Not on file  Social History Narrative   In [place]/ with wife; son Cherlyn Cushing syndrome]; no smoking/ no alcohol; works for [employer], [employer].  Social Determinants of Health   Financial Resource Strain:   . Difficulty of Paying Living Expenses: Not on file  Food Insecurity:   . Worried About Charity fundraiser in the Last Year: Not on file  . Ran Out of Food in the Last Year: Not on file  Transportation Needs:   . Lack of Transportation (Medical): Not on file  . Lack of  Transportation (Non-Medical): Not on file  Physical Activity:   . Days of Exercise per Week: Not on file  . Minutes of Exercise per Session: Not on file  Stress:   . Feeling of Stress : Not on file  Social Connections:   . Frequency of Communication with Friends and Family: Not on file  . Frequency of Social Gatherings with Friends and Family: Not on file  . Attends Religious Services: Not on file  . Active Member of Clubs or Organizations: Not on file  . Attends Archivist Meetings: Not on file  . Marital Status: Not on file  Intimate Partner Violence:   . Fear of Current or Ex-Partner: Not on file  . Emotionally Abused: Not on file  . Physically Abused: Not on file  . Sexually Abused: Not on file    FAMILY HISTORY: Family History  Problem Relation Age of Onset  . Diabetes Mother   . Heart disease Mother   . Mental illness Mother   . Thyroid disease Mother   . Heart disease Father   . Diabetes Father   . Mental illness Father     ALLERGIES:  has No Known Allergies.  MEDICATIONS:  Current Outpatient Medications  Medication Sig Dispense Refill  . acetaminophen (TYLENOL) 325 MG tablet Take by mouth every 6 (six) hours as needed for headache.    Marland Kitchen amLODipine (NORVASC) 10 MG tablet Take 1 tablet (10 mg total) by mouth daily. 90 tablet 0  . aspirin EC 81 MG tablet Take 81 mg by mouth daily.    Marland Kitchen atorvastatin (LIPITOR) 40 MG tablet Take 1 tablet (40 mg total) by mouth daily. 90 tablet 0  . carvedilol (COREG) 25 MG tablet TAKE 1 TABLET TWICE A DAY WITH A MEAL 180 tablet 0  . chlorthalidone (HYGROTON) 25 MG tablet TAKE 1/2 TABLET BY MOUTH EVERY DAY 45 tablet 1  . cyclobenzaprine (FLEXERIL) 10 MG tablet Take 0.5-1 tablets (5-10 mg total) by mouth 3 (three) times daily as needed for muscle spasms. 30 tablet 0  . fluticasone (FLONASE) 50 MCG/ACT nasal spray Place 2 sprays into both nostrils daily.    Marland Kitchen levothyroxine (SYNTHROID) 100 MCG tablet TAKE ONE TABLET BY MOUTH EVERY  DAY 90 tablet 0  . metFORMIN (GLUCOPHAGE) 500 MG tablet Take 1 tablet (500 mg total) by mouth 2 (two) times daily with a meal. 180 tablet 0  . montelukast (SINGULAIR) 10 MG tablet TAKE ONE TABLET BY MOUTH EVERY DAY 90 tablet 0  . olmesartan (BENICAR) 40 MG tablet Take 1 tablet (40 mg total) by mouth daily. 90 tablet 0  . potassium chloride (KLOR-CON) 10 MEQ tablet Take 1 tablet (10 mEq total) by mouth 2 (two) times daily. 180 tablet 0  . SUNItinib (SUTENT) 50 MG capsule Take 1 capsule (50 mg total) by mouth daily. One pill a day; 2 weeks-On and 1 week-OFF 14 capsule 6   No current facility-administered medications for this visit.    PHYSICAL EXAMINATION: ECOG PERFORMANCE STATUS: 0 - Asymptomatic  Vitals:   04/15/20 1002  BP: 118/83  Pulse: 71  Resp: 16  Temp: (!) 97.4 F (36.3 C)  SpO2: 98%   Filed Weights   04/15/20 1002  Weight: 245 lb (111.1 kg)    Physical Exam Constitutional:      Comments: Unaccompanied, wearing mask.  HENT:     Head: Normocephalic and atraumatic.     Mouth/Throat:     Pharynx: No oropharyngeal exudate.  Eyes:     General: No scleral icterus.    Conjunctiva/sclera: Conjunctivae normal.  Cardiovascular:     Rate and Rhythm: Normal rate and regular rhythm.  Pulmonary:     Effort: Pulmonary effort is normal. No respiratory distress.     Breath sounds: Normal breath sounds. No wheezing.  Abdominal:     General: Bowel sounds are normal. There is no distension.     Palpations: Abdomen is soft. There is no mass.     Tenderness: There is no abdominal tenderness. There is no guarding or rebound.  Musculoskeletal:        General: No tenderness. Normal range of motion.     Cervical back: Normal range of motion and neck supple.  Skin:    General: Skin is warm and dry.  Neurological:     Mental Status: He is alert and oriented to person, place, and time.  Psychiatric:        Mood and Affect: Mood and affect normal.     LABORATORY DATA:  I have  reviewed the data as listed Lab Results  Component Value Date   WBC 4.3 04/15/2020   HGB 13.0 04/15/2020   HCT 38.4 (L) 04/15/2020   MCV 87.3 04/15/2020   PLT 150 04/15/2020   Recent Labs    01/08/20 1003 03/04/20 1019 04/15/20 0947  NA 138 138 139  K 3.3* 3.5 3.6  CL 99 101 101  CO2 26 27 27   GLUCOSE 114* 114* 130*  BUN 16 21* 20  CREATININE 1.30* 1.30* 1.50*  CALCIUM 9.0 8.9 8.9  GFRNONAA >60 >60 54*  GFRAA >60 >60 >60  PROT 8.1 7.7 6.8  ALBUMIN 4.2 4.0 3.6  AST 69* 56* 47*  ALT 75* 56* 52*  ALKPHOS 59 60 53  BILITOT 0.8 0.8 1.1    RADIOGRAPHIC STUDIES: I have personally reviewed the radiological images as listed and agreed with the findings in the report. No results found.  ASSESSMENT & PLAN:  Cancer of left kidney ([place]) #Kidney cancer-clear cell carcinoma.  Stage III, pT3N0- Currently receiving adjuvant Sutent 50 mg, 2 weeks on, 1 week off. CT AUG  2021 [Urology]-no evidence of any recurrence. STABLE.     #Recommend finishing off Sutent available pills.  Patient should have finished total 1 year of Sutent adjuvant therapy. No new refills recommended.  Recommend imaging every 6 months for now/urology; CT- march 2022.    # LFTs-AST ALT- 47/52-stable; G-1; secondary to Sutent.  Monitor closely asymptomatic.  # Hypothyroidism-  on synthroid 100 mcg/d; May 2021-TSH-Normal.STABLE.   # DISPOSITION: # follow up in June 1st week of June 2022- MD-labs- cbc/cmp/TSH- Dr.B

## 2020-04-15 NOTE — Addendum Note (Signed)
Addended by: Delice Bison E on: 04/15/2020 01:57 PM   Modules accepted: Orders

## 2020-04-15 NOTE — Assessment & Plan Note (Addendum)
#  Kidney cancer-clear cell carcinoma.  Stage III, pT3N0- Currently receiving adjuvant Sutent 50 mg, 2 weeks on, 1 week off. CT AUG  2021 [Urology]-no evidence of any recurrence. STABLE.     #Recommend finishing off Sutent available pills.  Patient should have finished total 1 year of Sutent adjuvant therapy. No new refills recommended.  Recommend imaging every 6 months for now/urology; CT- march 2022.    # LFTs-AST ALT- 47/52-stable; G-1; secondary to Sutent.  Monitor closely asymptomatic.  # Hypothyroidism-  on synthroid 100 mcg/d; May 2021-TSH-Normal.STABLE.   # DISPOSITION: # follow up in June 1st week of June 2022- MD-labs- cbc/cmp/TSH- Dr.B 

## 2020-04-17 ENCOUNTER — Other Ambulatory Visit: Payer: Self-pay | Admitting: Nurse Practitioner

## 2020-04-17 DIAGNOSIS — E039 Hypothyroidism, unspecified: Secondary | ICD-10-CM

## 2020-04-19 MED ORDER — LEVOTHYROXINE SODIUM 100 MCG PO TABS: 100.0000 ug | ORAL_TABLET | Freq: Every day | ORAL | 0 refills | Status: DC

## 2020-04-24 ENCOUNTER — Other Ambulatory Visit: Payer: Self-pay | Admitting: Family Medicine

## 2020-04-24 DIAGNOSIS — I10 Essential (primary) hypertension: Secondary | ICD-10-CM

## 2020-04-25 MED ORDER — CARVEDILOL 25 MG PO TABS: ORAL_TABLET | ORAL | 0 refills | Status: DC

## 2020-05-08 ENCOUNTER — Other Ambulatory Visit: Payer: Self-pay | Admitting: Family Medicine

## 2020-05-08 DIAGNOSIS — I10 Essential (primary) hypertension: Secondary | ICD-10-CM

## 2020-05-08 DIAGNOSIS — E1165 Type 2 diabetes mellitus with hyperglycemia: Secondary | ICD-10-CM

## 2020-05-09 ENCOUNTER — Other Ambulatory Visit: Payer: Self-pay | Admitting: Family Medicine

## 2020-05-09 MED ORDER — METFORMIN HCL 500 MG PO TABS: 500.0000 mg | ORAL_TABLET | Freq: Two times a day (BID) | ORAL | 1 refills | Status: DC

## 2020-05-09 MED ORDER — OLMESARTAN MEDOXOMIL 40 MG PO TABS: 40.0000 mg | ORAL_TABLET | Freq: Every day | ORAL | 1 refills | Status: DC

## 2020-05-09 MED ORDER — AMLODIPINE BESYLATE 10 MG PO TABS: 10.0000 mg | ORAL_TABLET | Freq: Every day | ORAL | 1 refills | Status: DC

## 2020-05-28 ENCOUNTER — Other Ambulatory Visit: Payer: Self-pay | Admitting: Family Medicine

## 2020-05-28 DIAGNOSIS — I10 Essential (primary) hypertension: Secondary | ICD-10-CM

## 2020-05-30 MED ORDER — POTASSIUM CHLORIDE CRYS ER 10 MEQ PO TBCR: 10.0000 meq | EXTENDED_RELEASE_TABLET | Freq: Two times a day (BID) | ORAL | 0 refills | Status: DC

## 2020-06-06 ENCOUNTER — Telehealth: Payer: Self-pay

## 2020-06-06 DIAGNOSIS — I10 Essential (primary) hypertension: Secondary | ICD-10-CM

## 2020-06-06 NOTE — Telephone Encounter (Signed)
Copied from [place] [id number]. Topic: General - Other >> Jun 06, 2020  1:29 PM Hinda Lenis D wrote: PT refuses to take capsules he needs tablets  / potassium chloride (KLOR-CON) 10 MEQ tablet [[id number]] please advise

## 2020-06-06 NOTE — Telephone Encounter (Signed)
Covering inbox for Cyndia Skeeters, FNP while she is out of office.  It looks like it was ordered as Tablet by Elmyra Ricks on 05/30/20  Summary: Take 1 tablet (10 mEq total) by mouth 2 (two) times daily., Starting Mon 05/30/2020, Normal  Dose, Route, Frequency: 10 mEq, Oral, 2 times daily Start: 05/30/2020 Ord/Sold: 05/30/2020 (O) Report Taking:  Long-term:  Pharmacy: [id number] Med Dose History ChangeDiscontinue      Patient Sig: Take 1 tablet (10 mEq total) by mouth 2 (two) times daily.     Ordered on: 05/30/2020     Authorized by: Verl Bangs     Dispense: 180 tablet     Nobie Putnam, [facility] 06/06/2020, 5:51 PM

## 2020-06-07 ENCOUNTER — Other Ambulatory Visit: Payer: Self-pay | Admitting: Family Medicine

## 2020-06-07 MED ORDER — POTASSIUM CHLORIDE CRYS ER 20 MEQ PO TBCR: 10.0000 meq | EXTENDED_RELEASE_TABLET | Freq: Two times a day (BID) | ORAL | 1 refills | Status: DC

## 2020-06-07 NOTE — Telephone Encounter (Signed)
Alright. Thank you. That is a good idea. I have changed the rx - sent the 31mEq for take HALF twice a day  Nobie Putnam, [facility] 06/07/2020, 12:03 PM

## 2020-06-07 NOTE — Telephone Encounter (Signed)
The pt was notified of his medication changes. He verbalize understanding, no questions or concerns.

## 2020-06-07 NOTE — Telephone Encounter (Signed)
I contact the patient and he informed me that the [facility] informed him that they only have the Potassium Chloride 10 mEq in capsule form.  The pharmacy informed me that since they are a free medication clinic they only go by a certain formulary. The pharmacy recommended that we send in for 38mEq with the direction to take 1/2 tablet two times daily.

## 2020-06-12 ENCOUNTER — Other Ambulatory Visit: Payer: Self-pay | Admitting: Family Medicine

## 2020-06-12 DIAGNOSIS — E1165 Type 2 diabetes mellitus with hyperglycemia: Secondary | ICD-10-CM

## 2020-06-12 DIAGNOSIS — E785 Hyperlipidemia, unspecified: Secondary | ICD-10-CM

## 2020-06-15 MED ORDER — ATORVASTATIN CALCIUM 40 MG PO TABS: 40.0000 mg | ORAL_TABLET | Freq: Every day | ORAL | 0 refills | Status: DC

## 2020-06-17 ENCOUNTER — Other Ambulatory Visit: Payer: Self-pay

## 2020-06-17 ENCOUNTER — Other Ambulatory Visit: Payer: Self-pay | Admitting: Family Medicine

## 2020-06-17 ENCOUNTER — Encounter: Payer: Self-pay | Admitting: Family Medicine

## 2020-06-17 ENCOUNTER — Telehealth (INDEPENDENT_AMBULATORY_CARE_PROVIDER_SITE_OTHER): Payer: Self-pay | Admitting: Family Medicine

## 2020-06-17 DIAGNOSIS — R202 Paresthesia of skin: Secondary | ICD-10-CM | POA: Insufficient documentation

## 2020-06-17 DIAGNOSIS — E1165 Type 2 diabetes mellitus with hyperglycemia: Secondary | ICD-10-CM

## 2020-06-17 MED ORDER — GABAPENTIN 100 MG PO CAPS: 100.0000 mg | ORAL_CAPSULE | Freq: Three times a day (TID) | ORAL | 3 refills | Status: DC

## 2020-06-17 NOTE — Assessment & Plan Note (Signed)
Likely secondary to diabetes, but discussed could be carpal tunnel based on symptoms in bilateral hands that wax/wane.  Will trial on gabapentin 100mg  TID PRN for parasthesias and discussed if not having improvement or has worsening of symptoms to RTC sooner and we can re-evaluate.  Patient in agreement with plan.

## 2020-06-17 NOTE — Progress Notes (Signed)
Virtual Visit via Telephone  The purpose of this virtual visit is to provide medical care while limiting exposure to the novel coronavirus (COVID19) for both patient and office staff.  Consent was obtained for phone visit:  Yes.   Answered questions that patient had about telehealth interaction:  Yes.   I discussed the limitations, risks, security and privacy concerns of performing an evaluation and management service by telephone. I also discussed with the patient that there may be a patient responsible charge related to this service. The patient expressed understanding and agreed to proceed.  Patient is at home and is accessed via telephone Services are provided by Harlin Rain, FNP-C from [facility])  ---------------------------------------------------------------------- Chief Complaint  Patient presents with  . Hand Pain    intermittent numbness, tingling pain in the bilateral  hand and arms, mostly right. Mostly at bedtime. He also complain of some weakness in his hand. Causing him trouble with his grip. x 1 mth    S: Reviewed CMA documentation. I have called patient and gathered additional HPI as follows:  Charles Mathis states that he has been having some numbness/tingling in his bilateral hands and lower arms at night.  Typically he is moving his arms around and getting up and walking with some minimal relief of symptoms.  Has found intermittent waxing/waning of symptoms and has no changes in grip strength with opening jars/bottles.  Denies any similar symptoms in the past.  Reports his sugars have been slightly elevated lately.  Denies any repetitive hand movements at work or injury/trauma.  Patient is currently home Denies any high risk travel to areas of current concern for COVID19. Denies any known or suspected exposure to person with or possibly with COVID19.    Past Medical History:  Diagnosis Date  . Allergy   . Diabetes mellitus without  complication ([place])   . Hypertension   . Renal cancer, left ([place]) 01/2019   Left Nephrectomy   Social History   Tobacco Use  . Smoking status: Never Smoker  . Smokeless tobacco: Never Used  Vaping Use  . Vaping Use: Never used  Substance Use Topics  . Alcohol use: No  . Drug use: No    Current Outpatient Medications:  .  acetaminophen (TYLENOL) 325 MG tablet, Take by mouth every 6 (six) hours as needed for headache., Disp: , Rfl:  .  amLODipine (NORVASC) 10 MG tablet, Take 1 tablet (10 mg total) by mouth daily., Disp: 90 tablet, Rfl: 1 .  aspirin EC 81 MG tablet, Take 81 mg by mouth daily., Disp: , Rfl:  .  atorvastatin (LIPITOR) 40 MG tablet, Take 1 tablet (40 mg total) by mouth daily., Disp: 90 tablet, Rfl: 0 .  carvedilol (COREG) 25 MG tablet, TAKE 1 TABLET TWICE A DAY WITH A MEAL, Disp: 180 tablet, Rfl: 0 .  chlorthalidone (HYGROTON) 25 MG tablet, TAKE 1/2 TABLET BY MOUTH EVERY DAY, Disp: 45 tablet, Rfl: 1 .  cyclobenzaprine (FLEXERIL) 10 MG tablet, Take 0.5-1 tablets (5-10 mg total) by mouth 3 (three) times daily as needed for muscle spasms., Disp: 30 tablet, Rfl: 0 .  fluticasone (FLONASE) 50 MCG/ACT nasal spray, Place 2 sprays into both nostrils daily., Disp: , Rfl:  .  levothyroxine (SYNTHROID) 100 MCG tablet, Take 1 tablet (100 mcg total) by mouth daily., Disp: 90 tablet, Rfl: 0 .  metFORMIN (GLUCOPHAGE) 500 MG tablet, Take 1 tablet (500 mg total) by mouth 2 (two) times daily with a meal., Disp:  180 tablet, Rfl: 1 .  montelukast (SINGULAIR) 10 MG tablet, TAKE ONE TABLET BY MOUTH EVERY DAY, Disp: 90 tablet, Rfl: 0 .  olmesartan (BENICAR) 40 MG tablet, Take 1 tablet (40 mg total) by mouth daily., Disp: 90 tablet, Rfl: 1 .  potassium chloride (KLOR-CON) 20 MEQ tablet, Take 0.5 tablets (10 mEq total) by mouth 2 (two) times daily., Disp: 90 tablet, Rfl: 1 .  SUNItinib (SUTENT) 50 MG capsule, Take 1 capsule (50 mg total) by mouth daily. One pill a day; 2 weeks-On and 1 week-OFF,  Disp: 14 capsule, Rfl: 6 .  gabapentin (NEURONTIN) 100 MG capsule, Take 1 capsule (100 mg total) by mouth 3 (three) times daily., Disp: 90 capsule, Rfl: 3  Depression screen [facility] 2/9 05/08/2019 04/25/2017  Decreased Interest 0 0  Down, Depressed, Hopeless 0 0  PHQ - 2 Score 0 0    No flowsheet data found.  -------------------------------------------------------------------------- O: No physical exam performed due to remote telephone encounter.  Physical Exam: Patient remotely monitored without video.  Verbal communication appropriate.  Cognition normal.  Recent Results (from the past 2160 hour(s))  POCT glycosylated hemoglobin (Hb A1C)     Status: Abnormal   Collection Time: 03/25/20 11:44 AM  Result Value Ref Range   Hemoglobin A1C 6.5 (A) 4.0 - 5.6 %   HbA1c POC (<> result, manual entry)     HbA1c, POC (prediabetic range)     HbA1c, POC (controlled diabetic range)    Comprehensive metabolic panel     Status: Abnormal   Collection Time: 04/15/20  9:47 AM  Result Value Ref Range   Sodium 139 135 - 145 mmol/L   Potassium 3.6 3.5 - 5.1 mmol/L   Chloride 101 98 - 111 mmol/L   CO2 27 22 - 32 mmol/L   Glucose, Bld 130 (H) 70 - 99 mg/dL    Comment: Glucose reference range applies only to samples taken after fasting for at least 8 hours.   BUN 20 6 - 20 mg/dL   Creatinine, Ser 1.50 (H) 0.61 - 1.24 mg/dL   Calcium 8.9 8.9 - 10.3 mg/dL   Total Protein 6.8 6.5 - 8.1 g/dL   Albumin 3.6 3.5 - 5.0 g/dL   AST 47 (H) 15 - 41 U/L   ALT 52 (H) 0 - 44 U/L   Alkaline Phosphatase 53 38 - 126 U/L   Total Bilirubin 1.1 0.3 - 1.2 mg/dL   GFR calc non Af Amer 54 (L) >60 mL/min   GFR calc Af Amer >60 >60 mL/min   Anion gap 11 5 - 15    Comment: Performed at [facility], [address] [place], [place] [address]  CBC with Differential     Status: Abnormal   Collection Time: 04/15/20  9:47 AM  Result Value Ref Range   WBC 4.3 4.0 - 10.5 K/uL   RBC 4.40 4.22 - 5.81 MIL/uL   Hemoglobin  13.0 13.0 - 17.0 g/dL   HCT 38.4 (L) 39 - 52 %   MCV 87.3 80.0 - 100.0 fL   MCH 29.5 26.0 - 34.0 pg   MCHC 33.9 30.0 - 36.0 g/dL   RDW 16.4 (H) 11.5 - 15.5 %   Platelets 150 150 - 400 K/uL   nRBC 0.0 0.0 - 0.2 %   Neutrophils Relative % 49 %   Neutro Abs 2.1 1.7 - 7.7 K/uL   Lymphocytes Relative 31 %   Lymphs Abs 1.3 0.7 - 4.0 K/uL   Monocytes Relative  14 %   Monocytes Absolute 0.6 0.1 - 1.0 K/uL   Eosinophils Relative 6 %   Eosinophils Absolute 0.3 0.0 - 0.5 K/uL   Basophils Relative 0 %   Basophils Absolute 0.0 0.0 - 0.1 K/uL   Immature Granulocytes 0 %   Abs Immature Granulocytes 0.01 0.00 - 0.07 K/uL    Comment: Performed at [facility], [address] [place], [place] [address]    -------------------------------------------------------------------------- A&P:  Problem List Items Addressed This Visit      Endocrine   T2DM (type 2 diabetes mellitus) ([place])    See parasthesia of bilateral hands AP      Relevant Medications   gabapentin (NEURONTIN) 100 MG capsule     Other   Paresthesia of both hands - Primary    Likely secondary to diabetes, but discussed could be carpal tunnel based on symptoms in bilateral hands that wax/wane.  Will trial on gabapentin 100mg  TID PRN for parasthesias and discussed if not having improvement or has worsening of symptoms to RTC sooner and we can re-evaluate.  Patient in agreement with plan.      Relevant Medications   gabapentin (NEURONTIN) 100 MG capsule      Meds ordered this encounter  Medications  . gabapentin (NEURONTIN) 100 MG capsule    Sig: Take 1 capsule (100 mg total) by mouth 3 (three) times daily.    Dispense:  90 capsule    Refill:  3    Follow-up: - Return if symptoms worsen or fail to improve  Patient verbalizes understanding with the above medical recommendations including the limitation of remote medical advice.  Specific follow-up and call-back criteria were given for patient to follow-up or seek  medical care more urgently if needed.  - Time spent in direct consultation with patient on phone: 7 minutes  Harlin Rain, [facility] 06/17/2020, 4:58 PM

## 2020-06-17 NOTE — Assessment & Plan Note (Signed)
See parasthesia of bilateral hands AP

## 2020-07-10 ENCOUNTER — Other Ambulatory Visit: Payer: Self-pay | Admitting: Internal Medicine

## 2020-07-11 NOTE — Telephone Encounter (Signed)
Charles Mathis - Dr. Senaida Lange patient  Please consider renewal for singulair

## 2020-07-12 MED ORDER — MONTELUKAST SODIUM 10 MG PO TABS
ORAL_TABLET | ORAL | 0 refills | Status: DC
Start: 2020-07-12 — End: 2020-08-14

## 2020-07-21 ENCOUNTER — Inpatient Hospital Stay: Payer: Self-pay | Attending: Nurse Practitioner

## 2020-07-21 ENCOUNTER — Telehealth: Payer: Self-pay | Admitting: *Deleted

## 2020-07-21 ENCOUNTER — Encounter: Payer: Self-pay | Admitting: Family Medicine

## 2020-07-21 ENCOUNTER — Inpatient Hospital Stay (HOSPITAL_BASED_OUTPATIENT_CLINIC_OR_DEPARTMENT_OTHER): Payer: Self-pay | Admitting: Nurse Practitioner

## 2020-07-21 VITALS — BP 124/79 | HR 67 | Temp 96.9°F | Resp 18 | Wt 266.6 lb

## 2020-07-21 DIAGNOSIS — C642 Malignant neoplasm of left kidney, except renal pelvis: Secondary | ICD-10-CM

## 2020-07-21 DIAGNOSIS — R635 Abnormal weight gain: Secondary | ICD-10-CM | POA: Insufficient documentation

## 2020-07-21 DIAGNOSIS — R600 Localized edema: Secondary | ICD-10-CM

## 2020-07-21 DIAGNOSIS — R198 Other specified symptoms and signs involving the digestive system and abdomen: Secondary | ICD-10-CM

## 2020-07-21 DIAGNOSIS — Z7984 Long term (current) use of oral hypoglycemic drugs: Secondary | ICD-10-CM | POA: Insufficient documentation

## 2020-07-21 DIAGNOSIS — E119 Type 2 diabetes mellitus without complications: Secondary | ICD-10-CM | POA: Insufficient documentation

## 2020-07-21 DIAGNOSIS — I700 Atherosclerosis of aorta: Secondary | ICD-10-CM | POA: Insufficient documentation

## 2020-07-21 DIAGNOSIS — Z7982 Long term (current) use of aspirin: Secondary | ICD-10-CM | POA: Insufficient documentation

## 2020-07-21 DIAGNOSIS — Z905 Acquired absence of kidney: Secondary | ICD-10-CM | POA: Insufficient documentation

## 2020-07-21 DIAGNOSIS — Z9221 Personal history of antineoplastic chemotherapy: Secondary | ICD-10-CM | POA: Insufficient documentation

## 2020-07-21 DIAGNOSIS — I10 Essential (primary) hypertension: Secondary | ICD-10-CM | POA: Insufficient documentation

## 2020-07-21 DIAGNOSIS — Z79899 Other long term (current) drug therapy: Secondary | ICD-10-CM | POA: Insufficient documentation

## 2020-07-21 DIAGNOSIS — K760 Fatty (change of) liver, not elsewhere classified: Secondary | ICD-10-CM | POA: Insufficient documentation

## 2020-07-21 LAB — CBC WITH DIFFERENTIAL/PLATELET
Abs Immature Granulocytes: 0.06 10*3/uL (ref 0.00–0.07)
Basophils Absolute: 0 10*3/uL (ref 0.0–0.1)
Basophils Relative: 0 %
Eosinophils Absolute: 0.6 10*3/uL — ABNORMAL HIGH (ref 0.0–0.5)
Eosinophils Relative: 8 %
HCT: 34.1 % — ABNORMAL LOW (ref 39.0–52.0)
Hemoglobin: 11.5 g/dL — ABNORMAL LOW (ref 13.0–17.0)
Immature Granulocytes: 1 %
Lymphocytes Relative: 26 %
Lymphs Abs: 2.1 10*3/uL (ref 0.7–4.0)
MCH: 28.1 pg (ref 26.0–34.0)
MCHC: 33.7 g/dL (ref 30.0–36.0)
MCV: 83.4 fL (ref 80.0–100.0)
Monocytes Absolute: 1 10*3/uL (ref 0.1–1.0)
Monocytes Relative: 13 %
Neutro Abs: 4.1 10*3/uL (ref 1.7–7.7)
Neutrophils Relative %: 52 %
Platelets: 210 10*3/uL (ref 150–400)
RBC: 4.09 MIL/uL — ABNORMAL LOW (ref 4.22–5.81)
RDW: 14.2 % (ref 11.5–15.5)
WBC: 8 10*3/uL (ref 4.0–10.5)
nRBC: 0 % (ref 0.0–0.2)

## 2020-07-21 LAB — COMPREHENSIVE METABOLIC PANEL
ALT: 35 U/L (ref 0–44)
AST: 33 U/L (ref 15–41)
Albumin: 4 g/dL (ref 3.5–5.0)
Alkaline Phosphatase: 66 U/L (ref 38–126)
Anion gap: 10 (ref 5–15)
BUN: 19 mg/dL (ref 6–20)
CO2: 27 mmol/L (ref 22–32)
Calcium: 9.2 mg/dL (ref 8.9–10.3)
Chloride: 100 mmol/L (ref 98–111)
Creatinine, Ser: 1.19 mg/dL (ref 0.61–1.24)
GFR, Estimated: >60 mL/min (ref >60)
Glucose, Bld: 187 mg/dL — ABNORMAL HIGH (ref 70–99)
Potassium: 3.7 mmol/L (ref 3.5–5.1)
Sodium: 137 mmol/L (ref 135–145)
Total Bilirubin: 0.6 mg/dL (ref 0.3–1.2)
Total Protein: 7.6 g/dL (ref 6.5–8.1)

## 2020-07-21 NOTE — Telephone Encounter (Signed)
The pt was notified that he need to reach out to his oncologist to notify them of his weight gain per his PCP.

## 2020-07-21 NOTE — Telephone Encounter (Signed)
Patient has noticed 15-20 lb weight gain/ fluid retention. Reports changes in abd. Girth. Stated "my pants have gotten tighter and I've noticed edema in legs". He does not have any changes in appetite. He denies any abdominal pain.  He is currently off the Sutent since Sept 2021.  Spoke with Leotis Shames, NP- orders cbc/metc for today. Patient will be available for apt by 1015 am for lab only. And 1045 am with lauren, NP

## 2020-07-21 NOTE — Progress Notes (Signed)
Concerned with Fluid retention around 20 lbs. It started a couple weeks ago. The fluid is in his waist, hips and legs bilaterally. Stopped gabapentin 1 1/2 weeks ago, thinking it was the culprit. He doesn't feel bad otherwise. Voiding normally. Appetite is great. Denies dyspnea. He stopped taking Sutent in Sept or early October. Has generalized chronic joint  Pain in his back. States he has bad knees, carpal tunnel etc.

## 2020-07-25 ENCOUNTER — Other Ambulatory Visit: Payer: Self-pay | Admitting: Family Medicine

## 2020-07-25 DIAGNOSIS — I10 Essential (primary) hypertension: Secondary | ICD-10-CM

## 2020-07-25 DIAGNOSIS — E039 Hypothyroidism, unspecified: Secondary | ICD-10-CM

## 2020-07-26 ENCOUNTER — Other Ambulatory Visit: Payer: Self-pay | Admitting: Family Medicine

## 2020-07-26 MED ORDER — LEVOTHYROXINE SODIUM 100 MCG PO TABS: 100.0000 ug | ORAL_TABLET | Freq: Every day | ORAL | 0 refills | Status: DC

## 2020-07-26 MED ORDER — CARVEDILOL 25 MG PO TABS: ORAL_TABLET | ORAL | 0 refills | Status: DC

## 2020-07-28 ENCOUNTER — Other Ambulatory Visit: Payer: Self-pay

## 2020-07-28 ENCOUNTER — Ambulatory Visit
Admission: RE | Admit: 2020-07-28 | Discharge: 2020-07-28 | Disposition: A | Discharge status: Home / Self Care | Payer: Self-pay | Source: Ambulatory Visit | Attending: Nurse Practitioner | Admitting: Nurse Practitioner

## 2020-07-28 DIAGNOSIS — C642 Malignant neoplasm of left kidney, except renal pelvis: Secondary | ICD-10-CM | POA: Insufficient documentation

## 2020-07-28 MED ORDER — IOHEXOL 300 MG/ML  SOLN
100.0000 mL | Freq: Once | INTRAMUSCULAR | Status: AC | PRN
  Administered 2020-07-28: 100 mL via INTRAVENOUS

## 2020-08-01 ENCOUNTER — Encounter: Payer: Self-pay | Admitting: Nurse Practitioner

## 2020-08-01 MED ORDER — FUROSEMIDE 20 MG PO TABS: 20.0000 mg | ORAL_TABLET | Freq: Every day | ORAL | 0 refills | Status: DC | PRN

## 2020-08-01 NOTE — Progress Notes (Signed)
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 Sniskinski]; July CT 2020- mid anterior left kidney measuring 8.6 x 8.1 x 9.4 cm; CT chest negative  #April 06, 2019-Sutent 50 mg 2 weeks on 1 week off.   # DM- 2- OHA/ HTN  DIAGNOSIS: Left kidney cancer  STAGE: 3       ;GOALS: Cure  CURRENT/MOST RECENT THERAPY: Sutent    Cancer of left kidney ([place])  02/26/2019 Initial Diagnosis   Cancer of left kidney [facility])     Interval history- Charles Mathis, 50 year old male with above history of clear-cell carcinoma of the left kidney currently status post 1 year of adjuvant Sutent completed in October 2021.  Since that time, he says he has gained approximately 30 pounds and believes he is retaining fluid.  Has noticed fluid primarily in the waist, hips, and legs.  Says he is eating normally.  No new fatigue or weakness.  Otherwise feels well and better since stopping Sutent.  No dizziness or weakness, fevers or illness.  No easy bleeding or bruising.  No chest pain or shortness of breath.  No nausea, vomiting, constipation, diarrhea.  No urinary complaints.  Review of systems- Review of Systems  Constitutional: Negative for chills, fever, malaise/fatigue and weight loss.  HENT: Negative for hearing loss, nosebleeds, sore throat and tinnitus.   Eyes: Negative for blurred vision and double vision.  Respiratory: Negative for cough, hemoptysis, shortness of breath and wheezing.   Cardiovascular: Negative for chest pain, palpitations  and leg swelling.  Gastrointestinal: Negative for abdominal pain, blood in stool, constipation, diarrhea, melena, nausea and vomiting.  Genitourinary: Negative for dysuria and urgency.  Musculoskeletal: Negative for back pain, falls, joint pain and myalgias.  Skin: Negative for itching and rash.  Neurological: Negative for dizziness, tingling, sensory change, loss of consciousness, weakness and headaches.  Endo/Heme/Allergies: Negative for environmental allergies. Does not bruise/bleed easily.  Psychiatric/Behavioral: Negative for depression. The patient is not nervous/anxious and does not have insomnia.      No Known Allergies  Past Medical History:  Diagnosis Date  . Allergy   . Diabetes mellitus without complication ([place])   . Hypertension   . Renal cancer, left ([place]) 01/2019   Left Nephrectomy    Past Surgical History:  Procedure Laterality Date  . CYSTOSCOPY  02/06/2019   Procedure: CYSTOSCOPY;  Surgeon: Billey Co, MD;  Location: [facility] ORS;  Service: Urology;;  . LAPAROSCOPIC NEPHRECTOMY, HAND ASSISTED Left 02/06/2019   Procedure: HAND ASSISTED LAPAROSCOPIC LEFT RADICAL NEPHRECTOMY;  Surgeon: Billey Co, MD;  Location: [facility] ORS;  Service: Urology;  Laterality: Left;  . NO PAST SURGERIES      Social History   Socioeconomic History  . Marital status: Married    Spouse name: Not on file  . Number of children: Not on file  . Years of education: Not on file  . Highest education level: Not on file  Occupational History  . Not on file  Tobacco Use  . Smoking status: Never Smoker  . Smokeless tobacco: Never Used  Vaping Use  . Vaping  Use: Never used  Substance and Sexual Activity  . Alcohol use: No  . Drug use: No  . Sexual activity: Yes    Birth control/protection: Other-see comments    Comment: mutually monogamous relationship partner postmenopausal  Other Topics Concern  . Not on file  Social History Narrative   In [place]/ with wife; son Cherlyn Cushing  syndrome]; no smoking/ no alcohol; works for [employer].    Social Determinants of Health   Financial Resource Strain: Not on file  Food Insecurity: Not on file  Transportation Needs: Not on file  Physical Activity: Not on file  Stress: Not on file  Social Connections: Not on file  Intimate Partner Violence: Not on file    Family History  Problem Relation Age of Onset  . Diabetes Mother   . Heart disease Mother   . Mental illness Mother   . Thyroid disease Mother   . Heart disease Father   . Diabetes Father   . Mental illness Father      Current Outpatient Medications:  .  acetaminophen (TYLENOL) 325 MG tablet, Take by mouth every 6 (six) hours as needed for headache., Disp: , Rfl:  .  amLODipine (NORVASC) 10 MG tablet, Take 1 tablet (10 mg total) by mouth daily., Disp: 90 tablet, Rfl: 1 .  aspirin EC 81 MG tablet, Take 81 mg by mouth daily., Disp: , Rfl:  .  atorvastatin (LIPITOR) 40 MG tablet, Take 1 tablet (40 mg total) by mouth daily., Disp: 90 tablet, Rfl: 0 .  chlorthalidone (HYGROTON) 25 MG tablet, TAKE 1/2 TABLET BY MOUTH EVERY DAY, Disp: 45 tablet, Rfl: 1 .  fluticasone (FLONASE) 50 MCG/ACT nasal spray, Place 2 sprays into both nostrils daily., Disp: , Rfl:  .  metFORMIN (GLUCOPHAGE) 500 MG tablet, Take 1 tablet (500 mg total) by mouth 2 (two) times daily with a meal., Disp: 180 tablet, Rfl: 1 .  montelukast (SINGULAIR) 10 MG tablet, TAKE ONE TABLET BY MOUTH EVERY DAY, Disp: 90 tablet, Rfl: 0 .  olmesartan (BENICAR) 40 MG tablet, Take 1 tablet (40 mg total) by mouth daily., Disp: 90 tablet, Rfl: 1 .  potassium chloride (KLOR-CON) 20 MEQ tablet, Take 0.5 tablets (10 mEq total) by mouth 2 (two) times daily., Disp: 90 tablet, Rfl: 1 .  carvedilol (COREG) 25 MG tablet, TAKE 1 TABLET TWICE A DAY WITH A MEAL, Disp: 180 tablet, Rfl: 0 .  cyclobenzaprine (FLEXERIL) 10 MG tablet, Take 0.5-1 tablets (5-10 mg total) by mouth 3 (three) times daily as needed for muscle  spasms. (Patient not taking: Reported on 07/21/2020), Disp: 30 tablet, Rfl: 0 .  gabapentin (NEURONTIN) 100 MG capsule, Take 1 capsule (100 mg total) by mouth 3 (three) times daily. (Patient not taking: Reported on 07/21/2020), Disp: 90 capsule, Rfl: 3 .  levothyroxine (SYNTHROID) 100 MCG tablet, Take 1 tablet (100 mcg total) by mouth daily., Disp: 90 tablet, Rfl: 0 .  SUNItinib (SUTENT) 50 MG capsule, Take 1 capsule (50 mg total) by mouth daily. One pill a day; 2 weeks-On and 1 week-OFF (Patient not taking: Reported on 07/21/2020), Disp: 14 capsule, Rfl: 6  Physical exam:  Vitals:   07/21/20 1038  BP: 124/79  Pulse: 67  Resp: 18  Temp: (!) 96.9 F (36.1 C)  TempSrc: Tympanic  SpO2: 98%  Weight: 266 lb 9.6 oz (120.9 kg)   Physical Exam Constitutional:      General: He is not in acute distress.    Appearance: He is well-developed and well-nourished.  He is obese.  HENT:     Head: Normocephalic and atraumatic.     Nose: Nose normal.     Mouth/Throat:     Mouth: Oropharynx is clear and moist.     Pharynx: No oropharyngeal exudate.  Eyes:     General: No scleral icterus.    Extraocular Movements: EOM normal.     Conjunctiva/sclera: Conjunctivae normal.  Cardiovascular:     Rate and Rhythm: Normal rate and regular rhythm.     Heart sounds: Normal heart sounds.  Pulmonary:     Effort: Pulmonary effort is normal.     Breath sounds: Normal breath sounds. No wheezing.  Abdominal:     General: Bowel sounds are normal. There is no distension.     Palpations: Abdomen is soft. There is no mass.     Tenderness: There is no abdominal tenderness. There is no rebound.     Comments: rounded  Musculoskeletal:        General: Normal range of motion.     Cervical back: Normal range of motion and neck supple.     Right lower leg: Edema (mild nonpitting) present.     Left lower leg: Edema (mild nonpitting) present.  Skin:    General: Skin is warm and dry.  Neurological:     Mental Status: He is  alert and oriented to person, place, and time.  Psychiatric:        Mood and Affect: Mood and affect and mood normal.        Behavior: Behavior normal.      CMP Latest Ref Rng & Units 07/21/2020  Glucose 70 - 99 mg/dL 187(H)  BUN 6 - 20 mg/dL 19  Creatinine 0.61 - 1.24 mg/dL 1.19  Sodium 135 - 145 mmol/L 137  Potassium 3.5 - 5.1 mmol/L 3.7  Chloride 98 - 111 mmol/L 100  CO2 22 - 32 mmol/L 27  Calcium 8.9 - 10.3 mg/dL 9.2  Total Protein 6.5 - 8.1 g/dL 7.6  Total Bilirubin 0.3 - 1.2 mg/dL 0.6  Alkaline Phos 38 - 126 U/L 66  AST 15 - 41 U/L 33  ALT 0 - 44 U/L 35   CBC Latest Ref Rng & Units 07/21/2020  WBC 4.0 - 10.5 K/uL 8.0  Hemoglobin 13.0 - 17.0 g/dL 11.5(L)  Hematocrit 39.0 - 52.0 % 34.1(L)  Platelets 150 - 400 K/uL 210    No images are attached to the encounter.  CT Chest W Contrast  Result Date: 07/28/2020 CLINICAL DATA:  Renal cell carcinoma with left nephrectomy. Finishing chemotherapy. New onset of 20 pound fluid retention. EXAM: CT CHEST, ABDOMEN, AND PELVIS WITH CONTRAST TECHNIQUE: Multidetector CT imaging of the chest, abdomen and pelvis was performed following the standard protocol during bolus administration of intravenous contrast. CONTRAST:  159mL OMNIPAQUE IOHEXOL 300 MG/ML  SOLN COMPARISON:  03/11/2020 FINDINGS: CT CHEST FINDINGS Cardiovascular: Aortic atherosclerosis. Tortuous thoracic aorta. Borderline cardiomegaly. Lad and right coronary artery calcification. No central pulmonary embolism, on this non-dedicated study. Mediastinum/Nodes: No supraclavicular adenopathy. No mediastinal or hilar adenopathy. Mild distal esophageal wall thickening, including on 28/2, similar. Lungs/Pleura: No pleural fluid. 3 mm right middle and 2 mm left lower lobe pulmonary nodules are unchanged, likely benign. Musculoskeletal: No acute osseous abnormality. CT ABDOMEN PELVIS FINDINGS Hepatobiliary: Moderate to marked hepatic steatosis. No focal liver lesion. Normal gallbladder, without  biliary ductal dilatation. Pancreas: Normal, without mass or ductal dilatation. Spleen: Normal in size, without focal abnormality. Adrenals/Urinary Tract: Normal adrenal glands. Normal right kidney.  Left nephrectomy, without local recurrence. Normal urinary bladder. Stomach/Bowel: Normal stomach, without wall thickening. Normal colon and terminal ileum. Normal small bowel. Vascular/Lymphatic: Aortic atherosclerosis. Prominent porta hepatis nodes are similar and likely reactive in the setting of steatosis. No abdominopelvic adenopathy. Reproductive: Normal prostate. Other: No significant free fluid. Musculoskeletal: No acute osseous abnormality. IMPRESSION: 1. Status post left nephrectomy, without recurrent or metastatic disease. 2. Hepatic steatosis. 3. Age advanced coronary artery atherosclerosis. Recommend assessment of coronary risk factors and consideration of medical therapy. 4. Aortic Atherosclerosis (ICD10-I70.0). 5. Distal esophageal wall thickening, suggesting esophagitis. Electronically Signed   By: Abigail Miyamoto M.D.   On: 07/28/2020 14:45   CT Abdomen Pelvis W Contrast  Result Date: 07/28/2020 CLINICAL DATA:  Renal cell carcinoma with left nephrectomy. Finishing chemotherapy. New onset of 20 pound fluid retention. EXAM: CT CHEST, ABDOMEN, AND PELVIS WITH CONTRAST TECHNIQUE: Multidetector CT imaging of the chest, abdomen and pelvis was performed following the standard protocol during bolus administration of intravenous contrast. CONTRAST:  149mL OMNIPAQUE IOHEXOL 300 MG/ML  SOLN COMPARISON:  03/11/2020 FINDINGS: CT CHEST FINDINGS Cardiovascular: Aortic atherosclerosis. Tortuous thoracic aorta. Borderline cardiomegaly. Lad and right coronary artery calcification. No central pulmonary embolism, on this non-dedicated study. Mediastinum/Nodes: No supraclavicular adenopathy. No mediastinal or hilar adenopathy. Mild distal esophageal wall thickening, including on 28/2, similar. Lungs/Pleura: No pleural  fluid. 3 mm right middle and 2 mm left lower lobe pulmonary nodules are unchanged, likely benign. Musculoskeletal: No acute osseous abnormality. CT ABDOMEN PELVIS FINDINGS Hepatobiliary: Moderate to marked hepatic steatosis. No focal liver lesion. Normal gallbladder, without biliary ductal dilatation. Pancreas: Normal, without mass or ductal dilatation. Spleen: Normal in size, without focal abnormality. Adrenals/Urinary Tract: Normal adrenal glands. Normal right kidney. Left nephrectomy, without local recurrence. Normal urinary bladder. Stomach/Bowel: Normal stomach, without wall thickening. Normal colon and terminal ileum. Normal small bowel. Vascular/Lymphatic: Aortic atherosclerosis. Prominent porta hepatis nodes are similar and likely reactive in the setting of steatosis. No abdominopelvic adenopathy. Reproductive: Normal prostate. Other: No significant free fluid. Musculoskeletal: No acute osseous abnormality. IMPRESSION: 1. Status post left nephrectomy, without recurrent or metastatic disease. 2. Hepatic steatosis. 3. Age advanced coronary artery atherosclerosis. Recommend assessment of coronary risk factors and consideration of medical therapy. 4. Aortic Atherosclerosis (ICD10-I70.0). 5. Distal esophageal wall thickening, suggesting esophagitis. Electronically Signed   By: Abigail Miyamoto M.D.   On: 07/28/2020 14:45    Assessment and plan- Patient is a 50 y.o. male diagnosed with clear cell carcinoma of the left kidney s/p 1 year adjuvant sutent, completed October 2021 who presents to symptom management clinic for concerns of possible fluid retention and weight gain.  Etiology unclear.  Discussed with urology who agrees with plan to move up CT scan.  Can trial Lasix 20 mg daily as needed. Labs today reassuring.  Last TSH was normal.  We will check again at next lab draw.  Exam unremarkable and not convincing of fluid retention. If CT negative, follow up with PCP.    Visit Diagnosis 1. Cancer of left kidney  [facility])     Patient expressed understanding and was in agreement with this plan. He also understands that He can call clinic at any time with any questions, concerns, or complaints.   Thank you for allowing me to participate in the care of this very pleasant patient.   Beckey Rutter, DNP, AGNP-[facility] [place] [facility]

## 2020-08-14 ENCOUNTER — Other Ambulatory Visit: Payer: Self-pay | Admitting: Nurse Practitioner

## 2020-08-15 ENCOUNTER — Other Ambulatory Visit: Payer: Self-pay | Admitting: Internal Medicine

## 2020-08-15 MED ORDER — MONTELUKAST SODIUM 10 MG PO TABS: ORAL_TABLET | ORAL | 0 refills | Status: DC

## 2020-08-18 NOTE — Progress Notes (Signed)
Medication Management Clinic Visit Note  Patient: Charles Mathis MRN: [id number] Date of Birth: [birth year] PCP: Charles Bangs, FNP   Charles Mathis 50 y.o. male presents for a telephone visit for medication management today. Verified patient with two identifiers.   There were no vitals taken for this visit.  Patient Information   Past Medical History:  Diagnosis Date  . Allergy   . Diabetes mellitus without complication ([place])   . Hypertension   . Renal cancer, left ([place]) 01/2019   Left Nephrectomy      Past Surgical History:  Procedure Laterality Date  . CYSTOSCOPY  02/06/2019   Procedure: CYSTOSCOPY;  Surgeon: Charles Co, MD;  Location: [facility] ORS;  Service: Urology;;  . LAPAROSCOPIC NEPHRECTOMY, HAND ASSISTED Left 02/06/2019   Procedure: HAND ASSISTED LAPAROSCOPIC LEFT RADICAL NEPHRECTOMY;  Surgeon: Charles Co, MD;  Location: [facility] ORS;  Service: Urology;  Laterality: Left;  . NO PAST SURGERIES       Family History  Problem Relation Age of Onset  . Diabetes Mother   . Heart disease Mother   . Mental illness Mother   . Thyroid disease Mother   . Heart disease Father   . Diabetes Father   . Mental illness Father     New Diagnoses (since last visit): N/A  Family Support: Good  Lifestyle Diet: Breakfast: breakfast bowl Lunch: varies  Dinner: chicken parmesan and green beans Drinks: tea, soda    Current Exercise Habits: Home exercise routine, Type of exercise: walking, Time (Minutes): 15, Frequency (Times/Week): 3, Weekly Exercise (Minutes/Week): 45, Intensity: Mild       Social History   Substance and Sexual Activity  Alcohol Use No      Social History   Tobacco Use  Smoking Status Never Smoker  Smokeless Tobacco Never Used      Health Maintenance  Topic Date Due  . Hepatitis C Screening  Never done  . PNEUMOCOCCAL POLYSACCHARIDE VACCINE AGE 15-64 HIGH RISK  Never done  . COVID-19 Vaccine (1) Never done  . COLONOSCOPY (Pts  45-75yrs Insurance coverage will need to be confirmed)  Never done  . OPHTHALMOLOGY EXAM  12/19/2018  . FOOT EXAM  02/04/2020  . INFLUENZA VACCINE  Never done  . HEMOGLOBIN A1C  09/22/2020  . TETANUS/TDAP  12/17/2027  . HIV Screening  Completed   Health Maintenance/Date Completed  Last ED visit: none recent Last Visit to PCP: 06/17/20 Next Visit to PCP: 09/23/20 Specialist Visit: 09/15/20 - urology Dental Exam: "long time ago" Eye Exam: a few years ago Prostate Exam: never done Colonoscopy: never done Flu Vaccine: not this season Pneumonia Vaccine: completed COVID-19 Vaccine: completed primary series. Debating getting booster  Outpatient Encounter Medications as of 08/19/2020  Medication Sig  . acetaminophen (TYLENOL) 325 MG tablet Take 650 mg by mouth every 6 (six) hours as needed for headache.  Marland Kitchen amLODipine (NORVASC) 10 MG tablet Take 1 tablet (10 mg total) by mouth daily.  Marland Kitchen aspirin EC 81 MG tablet Take 81 mg by mouth daily.  Marland Kitchen atorvastatin (LIPITOR) 40 MG tablet Take 1 tablet (40 mg total) by mouth daily.  . carvedilol (COREG) 25 MG tablet TAKE 1 TABLET TWICE A DAY WITH A MEAL  . chlorthalidone (HYGROTON) 25 MG tablet TAKE 1/2 TABLET BY MOUTH EVERY DAY  . cyclobenzaprine (FLEXERIL) 10 MG tablet Take 0.5-1 tablets (5-10 mg total) by mouth 3 (three) times daily as needed for muscle spasms.  . fluticasone (FLONASE) 50 MCG/ACT nasal spray Place  2 sprays into both nostrils daily.  . furosemide (LASIX) 20 MG tablet Take 1 tablet (20 mg total) by mouth daily as needed for edema.  . gabapentin (NEURONTIN) 100 MG capsule Take 1 capsule (100 mg total) by mouth 3 (three) times daily. (Patient taking differently: Take 100 mg by mouth 3 (three) times daily as needed.)  . levothyroxine (SYNTHROID) 100 MCG tablet Take 1 tablet (100 mcg total) by mouth daily.  . metFORMIN (GLUCOPHAGE) 500 MG tablet Take 1 tablet (500 mg total) by mouth 2 (two) times daily with a meal.  . montelukast (SINGULAIR) 10  MG tablet TAKE ONE TABLET BY MOUTH EVERY DAY  . olmesartan (BENICAR) 40 MG tablet Take 1 tablet (40 mg total) by mouth daily.  . potassium chloride (KLOR-CON) 20 MEQ tablet Take 0.5 tablets (10 mEq total) by mouth 2 (two) times daily.  . [DISCONTINUED] SUNItinib (SUTENT) 50 MG capsule Take 1 capsule (50 mg total) by mouth daily. One pill a day; 2 weeks-On and 1 week-OFF (Patient not taking: No sig reported)   No facility-administered encounter medications on file as of 08/19/2020.     Assessment and Plan: Renal cell carcinoma s/p L nephrectomy Pt was previously on Sunitinib but was discontinued by his provider back in September or early October 2021. Pt follows with nephrology.   Diabetes Pt's diabetes is well controlled and he currently is only on metformin. His last A1c was 6.5% on 03/25/20. His A1c has consistently been <7% as far back as 12/16/17. Pt reports checking his BG every day and report values between 100-150 (often rarely sees 150 but reports that is the highest he has seen). Pt does incorporate mild exercise with some walking. Encouraged pt to increase his exercise and limit his dietary sugar/carb intake (especially with tea and soda). Pt understands and states he does not drink a lot of it. Pt denies any symptoms or episodes of hypoglycemia. Continue current regimen.   HTN Pt currently takes amlodipine, chlorthalidone, olmesartan, and carvedilol. He also is on furosemide PRN for edema. Unable to obtain vitals due to telephone visit, however pt reports checking his BP daily and reports values in the mid 120s to low 130s/70-85. Pt denies any symptoms or episodes of hypotension. Continue current regimen.   Hypothyroidism Pt currently takes levothyroxine 17mcg daily. Last TSH was on 11/27/19 and was WNL. Was due for another TSH lab draw in October 2021, however I do not see this value. Pt has been stable so okay to check within 12 months of last level. Continue current regimen.    Allergies Pt takes montelukast and fluticasone and does not have any complaints at this time. Continue current regimen.   Access/Adherence Pt uses a pill box to remember to take his medications. Pt did not need any refills at this time.    Sherilyn Banker, PharmD Pharmacy Resident  08/19/2020 2:09 PM

## 2020-08-19 ENCOUNTER — Other Ambulatory Visit: Payer: Self-pay

## 2020-08-19 ENCOUNTER — Ambulatory Visit: Payer: Self-pay | Admitting: Pharmacist

## 2020-08-19 DIAGNOSIS — Z79899 Other long term (current) drug therapy: Secondary | ICD-10-CM

## 2020-08-30 ENCOUNTER — Other Ambulatory Visit: Payer: Self-pay

## 2020-08-30 ENCOUNTER — Telehealth (INDEPENDENT_AMBULATORY_CARE_PROVIDER_SITE_OTHER): Payer: Self-pay | Admitting: Unknown Physician Specialty

## 2020-08-30 DIAGNOSIS — I158 Other secondary hypertension: Secondary | ICD-10-CM

## 2020-08-30 DIAGNOSIS — I10 Essential (primary) hypertension: Secondary | ICD-10-CM

## 2020-08-30 DIAGNOSIS — E1165 Type 2 diabetes mellitus with hyperglycemia: Secondary | ICD-10-CM

## 2020-08-30 DIAGNOSIS — E785 Hyperlipidemia, unspecified: Secondary | ICD-10-CM

## 2020-08-30 DIAGNOSIS — G4730 Sleep apnea, unspecified: Secondary | ICD-10-CM

## 2020-08-30 DIAGNOSIS — T50905A Adverse effect of unspecified drugs, medicaments and biological substances, initial encounter: Secondary | ICD-10-CM

## 2020-08-30 DIAGNOSIS — Z6838 Body mass index (BMI) 38.0-38.9, adult: Secondary | ICD-10-CM

## 2020-08-30 DIAGNOSIS — C642 Malignant neoplasm of left kidney, except renal pelvis: Secondary | ICD-10-CM

## 2020-08-30 NOTE — Assessment & Plan Note (Signed)
Check Lipid panel.  Pt will come in the am for labs

## 2020-08-30 NOTE — Assessment & Plan Note (Signed)
Will check another Hgb A1C.  Pt will come in and have it drawn this week

## 2020-08-30 NOTE — Assessment & Plan Note (Signed)
Will order sleep study.

## 2020-08-30 NOTE — Assessment & Plan Note (Signed)
Not checking.  Unable to confirm in office.  Will order sleep study as on 5 medications for BP.

## 2020-08-30 NOTE — Progress Notes (Signed)
There were no vitals taken for this visit.   Subjective:    Patient ID: Charles Mathis, male    DOB: 05-19-1971, 50 y.o.   MRN: [id number]  HPI: Charles Mathis is a 50 y.o. male  Chief Complaint  Patient presents with  . Hypertension    Pt said he had not been taking his medication or been monitoring it for the past week.   This visit was completed via telephone due to the restrictions of the COVID-19 pandemic. All issues as above were discussed and addressed but no physical exam was performed. If it was felt that the patient should be evaluated in the office, they were directed there. The patient verbally consented to this visit. Patient was unable to complete an audio/visual visit due to Technical difficulties. . Location of the patient: home . Location of the provider: work . Those involved with this call:  . Provider: Kathrine Haddock . CMA: [place] . Front Desk/Registration: Apolonio Schneiders  . Time spent on call: 15 minutes on the phone discussing health concerns. 10 minutes total spent in review of patient's record and preparation of their chart.  I verified patient identity using two factors (patient name and date of birth). Patient consents verbally to being seen via telemedicine visit today.   Diabetes: Using medications without difficulties No hypoglycemic episodes No hyperglycemic episodes Feet problems: None Blood Sugars averaging: Not checking eye exam within last year: no Last Hgb A1C was 6.1  Hypertension  Using medications without difficulty Average home BPs Not checking due to recent Covid illness   Using medication without problems or lightheadedness No chest pain with exertion or shortness of breath No Edema  Elevated Cholesterol Using medications without problems No Muscle aches  Diet: Exercise Could be better/ could be worse    Relevant past medical, surgical, family and social history reviewed and updated as indicated. Interim medical history since our  last visit reviewed. Allergies and medications reviewed and updated.  Review of Systems  Per HPI unless specifically indicated above     Objective:    There were no vitals taken for this visit.  Wt Readings from Last 3 Encounters:  07/21/20 266 lb 9.6 oz (120.9 kg)  04/15/20 245 lb (111.1 kg)  03/25/20 250 lb 6.4 oz (113.6 kg)    Physical Exam Neurological:     Mental Status: He is alert.      Assessment & Plan:   Problem List Items Addressed This Visit      Unprioritized   Cancer of left kidney [facility])    Per oncology      Class 2 severe obesity due to excess calories with serious comorbidity and body mass index (BMI) of 38.0 to 38.9 in adult [facility])    Will order sleep study       Relevant Orders   Hemoglobin A1c   Dyslipidemia    Check Lipid panel.  Pt will come in the am for labs      Relevant Orders   Lipid panel   Hypertension - Primary    Not checking.  Unable to confirm in office.  Will order sleep study as on 5 medications for BP.        RESOLVED: Hypertension secondary to drug   Sleep apnea    Suspected due to snoring, fatigue, resistent hypertension and obesity      T2DM (type 2 diabetes mellitus) ([place])    Will check another Hgb A1C.  Pt will come in and have  it drawn this week          Follow up plan: Return if symptoms worsen or fail to improve.

## 2020-08-30 NOTE — Assessment & Plan Note (Signed)
Per oncology °

## 2020-08-30 NOTE — Assessment & Plan Note (Signed)
Suspected due to snoring, fatigue, resistent hypertension and obesity

## 2020-09-02 ENCOUNTER — Other Ambulatory Visit: Payer: Self-pay | Admitting: Nurse Practitioner

## 2020-09-12 ENCOUNTER — Ambulatory Visit: Payer: Self-pay

## 2020-09-12 ENCOUNTER — Other Ambulatory Visit: Payer: Self-pay

## 2020-09-12 ENCOUNTER — Ambulatory Visit: Admission: RE | Admit: 2020-09-12 | Payer: Self-pay | Source: Ambulatory Visit

## 2020-09-12 ENCOUNTER — Ambulatory Visit
Admission: RE | Admit: 2020-09-12 | Discharge: 2020-09-12 | Disposition: A | Discharge status: Home / Self Care | Payer: Self-pay | Source: Ambulatory Visit | Attending: Urology | Admitting: Urology

## 2020-09-12 DIAGNOSIS — C642 Malignant neoplasm of left kidney, except renal pelvis: Secondary | ICD-10-CM | POA: Insufficient documentation

## 2020-09-12 MED ORDER — IOHEXOL 300 MG/ML  SOLN
100.0000 mL | Freq: Once | INTRAMUSCULAR | Status: AC | PRN
  Administered 2020-09-12: 100 mL via INTRAVENOUS

## 2020-09-15 ENCOUNTER — Ambulatory Visit (INDEPENDENT_AMBULATORY_CARE_PROVIDER_SITE_OTHER): Payer: Self-pay | Admitting: Urology

## 2020-09-15 ENCOUNTER — Other Ambulatory Visit: Payer: Self-pay

## 2020-09-15 ENCOUNTER — Encounter: Payer: Self-pay | Admitting: Urology

## 2020-09-15 VITALS — BP 129/78 | HR 74 | Ht 67.0 in | Wt 262.0 lb

## 2020-09-15 DIAGNOSIS — C642 Malignant neoplasm of left kidney, except renal pelvis: Secondary | ICD-10-CM

## 2020-09-15 NOTE — Progress Notes (Signed)
   09/15/2020 3:37 PM   Charles Mathis September 27, 1970 [id number]  Reason for visit: Follow up kidney cancer  HPI: I saw [name] back in urology clinic today for follow-up of kidney cancer. He is a healthy 50 year old male that presented to the ED on 02/05/2019 with gross hematuria,flank pain,clot retention, and an enhancing left 10 cm renal mass. He underwent uncomplicated left laparoscopic hand-assisted radical nephrectomy on 02/06/2019, as well as cystoscopy that showed no concerning bladder lesions.   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  He received one year of adjuvent sutent(completed October '21). He denies any flank pain, gross hematuria, or weight loss.  I personally reviewed his recent imaging with CT of the chest, abdomen, and pelvis on 09/13/2020, there is no evidence of recurrence or metastatic disease. Most recent renal function is stable with creatinine 1.19, EGFR greater than 60.   We had a long conversation about his history of RCC and high risk for recurrence. We discussed approximately 20 to 30% recurrence rate for patients with high risk stage III kidney cancer.We will continue imaging every 6 months with CT chest, abdomen, pelvis for the first 3 years, then space to annual imaging to year 5.   RTC 6 mo with CT C/A/P  Billey Co, MD  [facility] [address], [place], [place] [address] [phone]

## 2020-09-23 ENCOUNTER — Other Ambulatory Visit: Payer: Self-pay | Admitting: Nurse Practitioner

## 2020-09-23 ENCOUNTER — Ambulatory Visit: Payer: Self-pay | Admitting: Family Medicine

## 2020-09-26 ENCOUNTER — Other Ambulatory Visit: Payer: Self-pay

## 2020-09-26 DIAGNOSIS — I10 Essential (primary) hypertension: Secondary | ICD-10-CM

## 2020-09-26 MED ORDER — CHLORTHALIDONE 25 MG PO TABS: 12.5000 mg | ORAL_TABLET | Freq: Every day | ORAL | 1 refills | Status: DC

## 2020-09-30 ENCOUNTER — Encounter: Payer: Self-pay | Admitting: Unknown Physician Specialty

## 2020-10-04 ENCOUNTER — Other Ambulatory Visit: Payer: Self-pay | Admitting: Unknown Physician Specialty

## 2020-10-04 DIAGNOSIS — E785 Hyperlipidemia, unspecified: Secondary | ICD-10-CM

## 2020-10-04 DIAGNOSIS — E1165 Type 2 diabetes mellitus with hyperglycemia: Secondary | ICD-10-CM

## 2020-10-04 MED ORDER — ATORVASTATIN CALCIUM 40 MG PO TABS
40.0000 mg | ORAL_TABLET | Freq: Every day | ORAL | 1 refills | Status: DC
Start: 2020-10-04 — End: 2020-10-04

## 2020-11-08 ENCOUNTER — Other Ambulatory Visit: Payer: Self-pay

## 2020-11-08 ENCOUNTER — Other Ambulatory Visit: Payer: Self-pay | Admitting: Internal Medicine

## 2020-11-08 MED ORDER — OLMESARTAN MEDOXOMIL 40 MG PO TABS
ORAL_TABLET | Freq: Every day | ORAL | 1 refills | Status: DC
  Filled 2020-11-08 (×2): qty 90, 90d supply, fill #0
  Filled 2021-03-04: qty 90, 90d supply, fill #1

## 2020-11-08 MED ORDER — MONTELUKAST SODIUM 10 MG PO TABS
ORAL_TABLET | Freq: Every day | ORAL | 0 refills | Status: DC
  Filled 2020-11-08 (×2): qty 90, 90d supply, fill #0

## 2020-11-08 MED ORDER — CARVEDILOL 25 MG PO TABS
ORAL_TABLET | ORAL | 0 refills | Status: DC
  Filled 2020-11-08: qty 180, 90d supply, fill #0

## 2020-11-08 MED ORDER — LEVOTHYROXINE SODIUM 100 MCG PO TABS
ORAL_TABLET | Freq: Every day | ORAL | 0 refills | Status: DC
Start: 2020-11-08 — End: 2021-02-14
  Filled 2020-11-08: qty 90, 90d supply, fill #0

## 2020-11-09 ENCOUNTER — Other Ambulatory Visit: Payer: Self-pay

## 2020-11-26 ENCOUNTER — Other Ambulatory Visit: Payer: Self-pay | Admitting: Family Medicine

## 2020-11-26 ENCOUNTER — Other Ambulatory Visit: Payer: Self-pay

## 2020-11-26 DIAGNOSIS — I10 Essential (primary) hypertension: Secondary | ICD-10-CM

## 2020-11-26 MED FILL — Chlorthalidone Tab 25 MG: ORAL | 30 days supply | Qty: 15 | Fill #0 | Status: AC

## 2020-11-27 MED ORDER — POTASSIUM CHLORIDE CRYS ER 20 MEQ PO TBCR
EXTENDED_RELEASE_TABLET | ORAL | 1 refills | Status: DC
  Filled 2020-11-27: qty 30, 30d supply, fill #0
  Filled 2021-01-23: qty 30, 30d supply, fill #1
  Filled 2021-03-04: qty 30, 30d supply, fill #2
  Filled 2021-04-02: qty 30, 30d supply, fill #3
  Filled 2021-05-07: qty 30, 30d supply, fill #4
  Filled 2021-06-11: qty 30, 30d supply, fill #5

## 2020-11-27 NOTE — Telephone Encounter (Signed)
MyChart message sent to pt to call and make appt for CPE. Pt is requesting to transfer care to Charles Silversmith NP.

## 2020-11-28 ENCOUNTER — Other Ambulatory Visit: Payer: Self-pay

## 2020-11-29 ENCOUNTER — Other Ambulatory Visit: Payer: Self-pay

## 2020-11-29 DIAGNOSIS — E1165 Type 2 diabetes mellitus with hyperglycemia: Secondary | ICD-10-CM

## 2020-11-29 DIAGNOSIS — I10 Essential (primary) hypertension: Secondary | ICD-10-CM

## 2020-11-29 MED ORDER — AMLODIPINE BESYLATE 10 MG PO TABS
ORAL_TABLET | Freq: Every day | ORAL | 1 refills | Status: DC
  Filled 2020-11-29: qty 30, 30d supply, fill #0
  Filled 2021-01-09: qty 30, 30d supply, fill #1
  Filled 2021-02-07 – 2021-02-14 (×2): qty 30, 30d supply, fill #2
  Filled 2021-03-19: qty 30, 30d supply, fill #3
  Filled 2021-04-17: qty 30, 30d supply, fill #4
  Filled 2021-05-21: qty 30, 30d supply, fill #5

## 2020-11-29 MED ORDER — METFORMIN HCL 500 MG PO TABS
ORAL_TABLET | Freq: Two times a day (BID) | ORAL | 1 refills | Status: DC
  Filled 2020-11-29: qty 60, 30d supply, fill #0
  Filled 2021-01-23: qty 60, 30d supply, fill #1
  Filled 2021-02-22: qty 60, 30d supply, fill #2
  Filled 2021-03-27: qty 60, 30d supply, fill #3
  Filled 2021-04-23: qty 60, 30d supply, fill #4
  Filled 2021-05-28: qty 60, 30d supply, fill #5

## 2020-11-30 ENCOUNTER — Other Ambulatory Visit: Payer: Self-pay

## 2020-12-01 ENCOUNTER — Other Ambulatory Visit: Payer: Self-pay

## 2020-12-05 ENCOUNTER — Other Ambulatory Visit: Payer: Self-pay

## 2020-12-14 ENCOUNTER — Encounter: Payer: Self-pay | Admitting: Internal Medicine

## 2020-12-14 ENCOUNTER — Ambulatory Visit (INDEPENDENT_AMBULATORY_CARE_PROVIDER_SITE_OTHER): Payer: Self-pay | Admitting: Internal Medicine

## 2020-12-14 ENCOUNTER — Other Ambulatory Visit: Payer: Self-pay

## 2020-12-14 VITALS — BP 126/65 | HR 69 | Temp 97.8°F | Resp 18 | Ht 67.0 in | Wt 256.2 lb

## 2020-12-14 DIAGNOSIS — E785 Hyperlipidemia, unspecified: Secondary | ICD-10-CM

## 2020-12-14 DIAGNOSIS — I10 Essential (primary) hypertension: Secondary | ICD-10-CM

## 2020-12-14 DIAGNOSIS — E1165 Type 2 diabetes mellitus with hyperglycemia: Secondary | ICD-10-CM

## 2020-12-14 DIAGNOSIS — Z85528 Personal history of other malignant neoplasm of kidney: Secondary | ICD-10-CM

## 2020-12-14 DIAGNOSIS — E039 Hypothyroidism, unspecified: Secondary | ICD-10-CM

## 2020-12-14 DIAGNOSIS — G4733 Obstructive sleep apnea (adult) (pediatric): Secondary | ICD-10-CM

## 2020-12-14 LAB — POCT GLYCOSYLATED HEMOGLOBIN (HGB A1C): Hemoglobin A1C: 6.4 % — AB (ref 4.0–5.6)

## 2020-12-14 NOTE — Patient Instructions (Signed)

## 2020-12-14 NOTE — Assessment & Plan Note (Signed)
Controlled on Olmesartan, Chlorthalidone, Carvedilol and Amlodipine Reinforced DASH diet and exercise for weight loss Will obtain CMET at annual exam due to lack of insurance at this time

## 2020-12-14 NOTE — Progress Notes (Signed)
Subjective:    Patient ID: Charles Mathis, male    DOB: June 28, 1971, 50 y.o.   MRN: [id number]  HPI  Patient presents the clinic today for follow-up of chronic conditions.  He is establishing care with me today, transferring care from [facility], NP.  HTN: His BP today is 126/65.  He is taking Amlodipine, Carvedilol, Chlorthalidone and Olmesartan as prescribed.  He is taking a potassium supplement as well.  ECG from 01/2019 reviewed.  HLD: His last LDL was 59, triglycerides 331, 01/2020.  He denies myalgias on Atorvastatin.  He does not consume a low-fat diet.  DM2 with Peripheral Neuropathy: His last A1c was 6.5%, 03/2020.  He is taking Metformin as prescribed. He no longer takes Gabapentin. He does not check his sugars.  He does not check his feet routinely.  His last eye exam was a few years ago.  Flu never.  Pneumovax never.  COVID Moderna x 2.  Hypothyroidism: He denies any issues on his current dose of Levothyroxine.  He is not following with endocrinology.  OSA: He averages 8 hours of sleep per night without the use of his CPAP.  There is no sleep study on file.  Hx of Left Kidney Cancer: s/p nephrectomy and chemo. He follows with oncology.  Review of Systems      Past Medical History:  Diagnosis Date  . Allergy   . Diabetes mellitus without complication ([place])   . Hypertension   . Renal cancer, left ([place]) 01/2019   Left Nephrectomy    Current Outpatient Medications  Medication Sig Dispense Refill  . acetaminophen (TYLENOL) 325 MG tablet Take 650 mg by mouth every 6 (six) hours as needed for headache.    Marland Kitchen amLODipine (NORVASC) 10 MG tablet TAKE ONE TABLET BY MOUTH EVERY DAY 90 tablet 1  . aspirin EC 81 MG tablet Take 81 mg by mouth daily.    Marland Kitchen atorvastatin (LIPITOR) 40 MG tablet TAKE ONE TABLET BY MOUTH EVERY DAY 90 tablet 1  . carvedilol (COREG) 25 MG tablet TAKE 1 TABLET TWICE A DAY WITH A MEAL 180 tablet 0  . chlorthalidone (HYGROTON) 25 MG tablet TAKE 1/2 TABLET  (12.5 MG) BY MOUTH EVERY DAY 45 tablet 1  . cyclobenzaprine (FLEXERIL) 10 MG tablet Take 0.5-1 tablets (5-10 mg total) by mouth 3 (three) times daily as needed for muscle spasms. 30 tablet 0  . fluticasone (FLONASE) 50 MCG/ACT nasal spray Place 2 sprays into both nostrils daily.    Marland Kitchen gabapentin (NEURONTIN) 100 MG capsule TAKE ONE CAPSULE BY MOUTH 3 TIMES A DAY 90 capsule 3  . levothyroxine (SYNTHROID) 100 MCG tablet TAKE ONE TABLET BY MOUTH EVERY DAY 90 tablet 0  . metFORMIN (GLUCOPHAGE) 500 MG tablet TAKE ONE TABLET BY MOUTH 2 TIMES A DAY WITH A MEAL 180 tablet 1  . montelukast (SINGULAIR) 10 MG tablet TAKE ONE TABLET BY MOUTH EVERY DAY 90 tablet 0  . olmesartan (BENICAR) 40 MG tablet TAKE ONE TABLET BY MOUTH EVERY DAY 90 tablet 1  . potassium chloride SA (KLOR-CON) 20 MEQ tablet TAKE HALF A TABLET BY MOUTH 2 TIMES A DAY 90 tablet 1   No current facility-administered medications for this visit.    No Known Allergies  Family History  Problem Relation Age of Onset  . Diabetes Mother   . Heart disease Mother   . Mental illness Mother   . Thyroid disease Mother   . Heart disease Father   . Diabetes Father   .  Mental illness Father     Social History   Socioeconomic History  . Marital status: Married    Spouse name: Not on file  . Number of children: Not on file  . Years of education: Not on file  . Highest education level: Not on file  Occupational History  . Not on file  Tobacco Use  . Smoking status: Never Smoker  . Smokeless tobacco: Never Used  Vaping Use  . Vaping Use: Never used  Substance and Sexual Activity  . Alcohol use: No  . Drug use: No  . Sexual activity: Yes    Birth control/protection: Other-see comments    Comment: mutually monogamous relationship partner postmenopausal  Other Topics Concern  . Not on file  Social History Narrative   In [place]/ with wife; son Cherlyn Cushing syndrome]; no smoking/ no alcohol; works for [employer].    Social  Determinants of Health   Financial Resource Strain: Not on file  Food Insecurity: Not on file  Transportation Needs: Not on file  Physical Activity: Not on file  Stress: Not on file  Social Connections: Not on file  Intimate Partner Violence: Not on file     Constitutional: Denies fever, malaise, fatigue, headache or abrupt weight changes.  HEENT: Denies eye pain, eye redness, ear pain, ringing in the ears, wax buildup, runny nose, nasal congestion, bloody nose, or sore throat. Respiratory: Denies difficulty breathing, shortness of breath, cough or sputum production.   Cardiovascular: Pt reports intermittent swelling in legs. Denies chest pain, chest tightness, palpitations or swelling in the hands. Gastrointestinal: Denies abdominal pain, bloating, constipation, diarrhea or blood in the stool.  GU: Denies urgency, frequency, pain with urination, burning sensation, blood in urine, odor or discharge. Musculoskeletal: Denies decrease in range of motion, difficulty with gait, muscle pain or joint pain and swelling.  Skin: Denies redness, rashes, lesions or ulcercations.  Neurological: Patient reports peripheral neuropathy.  Denies dizziness, difficulty with memory, difficulty with speech or problems with balance and coordination.  Psych: Denies anxiety, depression, SI/HI.  No other specific complaints in a complete review of systems (except as listed in HPI above).   Objective:   Physical Exam   BP 126/65 (BP Location: Right Arm, Patient Position: Sitting, Cuff Size: Large)   Pulse 69   Temp 97.8 F (36.6 C) (Temporal)   Resp 18   Ht 5\' 7"  (1.702 m)   Wt 256 lb 3.2 oz (116.2 kg)   SpO2 99%   BMI 40.13 kg/m   Wt Readings from Last 3 Encounters:  09/15/20 262 lb (118.8 kg)  07/21/20 266 lb 9.6 oz (120.9 kg)  04/15/20 245 lb (111.1 kg)    General: Appears his stated age, obese, in NAD. Skin: Warm, dry and intact. No  ulcerations noted. HEENT: Head: normal shape and size;  Eyes: sclera white and EOMs intact;  Neck:  Neck supple, trachea midline. No masses, lumps or thyromegaly present.  Cardiovascular: Normal rate and rhythm. S1,S2 noted.  No murmur, rubs or gallops noted. No JVD or BLE edema. No carotid bruits noted. Pulmonary/Chest: Normal effort and positive vesicular breath sounds. No respiratory distress. No wheezes, rales or ronchi noted.  Musculoskeletal: No difficulty with gait.  Neurological: Alert and oriented.   BMET    Component Value Date/Time   NA 137 07/21/2020 1021   NA 143 01/24/2017 1306   NA 139 09/22/2012 1156   K 3.7 07/21/2020 1021   K 3.9 09/22/2012 1156   CL 100 07/21/2020 1021  CL 106 09/22/2012 1156   CO2 27 07/21/2020 1021   CO2 29 09/22/2012 1156   GLUCOSE 187 (H) 07/21/2020 1021   GLUCOSE 96 09/22/2012 1156   BUN 19 07/21/2020 1021   BUN 20 01/24/2017 1306   BUN 14 09/22/2012 1156   CREATININE 1.19 07/21/2020 1021   CREATININE 0.91 03/13/2018 0823   CALCIUM 9.2 07/21/2020 1021   CALCIUM 8.6 09/22/2012 1156   GFRNONAA >60 07/21/2020 1021   GFRNONAA 100 03/13/2018 0823   GFRAA >60 04/15/2020 0947   GFRAA 116 03/13/2018 0823    Lipid Panel     Component Value Date/Time   CHOL 154 01/22/2020 0934   TRIG 331 (H) 01/22/2020 0934   HDL 29 (L) 01/22/2020 0934   CHOLHDL 5.3 01/22/2020 0934   VLDL 66 (H) 01/22/2020 0934   LDLCALC 59 01/22/2020 0934   LDLCALC 65 03/13/2018 0823    CBC    Component Value Date/Time   WBC 8.0 07/21/2020 1021   RBC 4.09 (L) 07/21/2020 1021   HGB 11.5 (L) 07/21/2020 1021   HGB 12.5 (L) 09/22/2012 1156   HCT 34.1 (L) 07/21/2020 1021   HCT 38.1 (L) 09/22/2012 1156   PLT 210 07/21/2020 1021   PLT 275 09/22/2012 1156   MCV 83.4 07/21/2020 1021   MCV 81 09/22/2012 1156   MCH 28.1 07/21/2020 1021   MCHC 33.7 07/21/2020 1021   RDW 14.2 07/21/2020 1021   RDW 15.0 (H) 09/22/2012 1156   LYMPHSABS 2.1 07/21/2020 1021   MONOABS 1.0 07/21/2020 1021   EOSABS 0.6 (H) 07/21/2020 1021    BASOSABS 0.0 07/21/2020 1021    Hgb A1C Lab Results  Component Value Date   HGBA1C 6.5 (A) 03/25/2020           Assessment & Plan:    Webb Silversmith, NP This visit occurred during the SARS-CoV-2 public health emergency.  Safety protocols were in place, including screening questions prior to the visit, additional usage of staff PPE, and extensive cleaning of exam room while observing appropriate contact time as indicated for disinfecting solutions.

## 2020-12-14 NOTE — Assessment & Plan Note (Signed)
Will check CMET and Lipid profile with annual exam due to lack of insurance at this time Continue Atorvastatin Encouraged him to consume a low fat diet

## 2020-12-14 NOTE — Assessment & Plan Note (Signed)
Encouraged weight loss as this can reduce sleep apnea symptoms He is not wearing CPAP at this time

## 2020-12-14 NOTE — Assessment & Plan Note (Signed)
In remission He continues to see oncology, will follow

## 2020-12-14 NOTE — Assessment & Plan Note (Signed)
Will check TSH and Free T4 at annual exam due to lack of insurance at this time Continue Levothyroxine

## 2020-12-14 NOTE — Assessment & Plan Note (Signed)
POCT A1C 6.4% No urine microalbumin secondary to ARB therapy Encouraged him to consume a low carb diet and exercise for weight loss Continue Metformin He declines eye exam due to lack of insurance Encouraged routine foot exams He declines flu, pneumovax due to financial reasons at this time Encouraged him to get his covid booster and provide [place] with a copy of his vaccine card

## 2020-12-16 ENCOUNTER — Other Ambulatory Visit: Payer: Self-pay

## 2020-12-16 ENCOUNTER — Inpatient Hospital Stay: Payer: Self-pay | Admitting: Internal Medicine

## 2020-12-16 ENCOUNTER — Inpatient Hospital Stay: Payer: Self-pay | Attending: Internal Medicine

## 2020-12-16 DIAGNOSIS — C642 Malignant neoplasm of left kidney, except renal pelvis: Secondary | ICD-10-CM | POA: Insufficient documentation

## 2020-12-16 DIAGNOSIS — E039 Hypothyroidism, unspecified: Secondary | ICD-10-CM | POA: Insufficient documentation

## 2020-12-16 LAB — CBC WITH DIFFERENTIAL/PLATELET
Abs Immature Granulocytes: 0.06 10*3/uL (ref 0.00–0.07)
Basophils Absolute: 0 10*3/uL (ref 0.0–0.1)
Basophils Relative: 1 %
Eosinophils Absolute: 0.6 10*3/uL — ABNORMAL HIGH (ref 0.0–0.5)
Eosinophils Relative: 8 %
HCT: 37.7 % — ABNORMAL LOW (ref 39.0–52.0)
Hemoglobin: 12.4 g/dL — ABNORMAL LOW (ref 13.0–17.0)
Immature Granulocytes: 1 %
Lymphocytes Relative: 24 %
Lymphs Abs: 1.8 10*3/uL (ref 0.7–4.0)
MCH: 26.4 pg (ref 26.0–34.0)
MCHC: 32.9 g/dL (ref 30.0–36.0)
MCV: 80.4 fL (ref 80.0–100.0)
Monocytes Absolute: 0.8 10*3/uL (ref 0.1–1.0)
Monocytes Relative: 11 %
Neutro Abs: 4.2 10*3/uL (ref 1.7–7.7)
Neutrophils Relative %: 55 %
Platelets: 235 10*3/uL (ref 150–400)
RBC: 4.69 MIL/uL (ref 4.22–5.81)
RDW: 14.2 % (ref 11.5–15.5)
WBC: 7.5 10*3/uL (ref 4.0–10.5)
nRBC: 0 % (ref 0.0–0.2)

## 2020-12-16 LAB — COMPREHENSIVE METABOLIC PANEL
ALT: 30 U/L (ref 0–44)
AST: 25 U/L (ref 15–41)
Albumin: 3.9 g/dL (ref 3.5–5.0)
Alkaline Phosphatase: 95 U/L (ref 38–126)
Anion gap: 12 (ref 5–15)
BUN: 26 mg/dL — ABNORMAL HIGH (ref 6–20)
CO2: 24 mmol/L (ref 22–32)
Calcium: 9.1 mg/dL (ref 8.9–10.3)
Chloride: 99 mmol/L (ref 98–111)
Creatinine, Ser: 1.1 mg/dL (ref 0.61–1.24)
GFR, Estimated: >60 mL/min (ref >60)
Glucose, Bld: 224 mg/dL — ABNORMAL HIGH (ref 70–99)
Potassium: 3.6 mmol/L (ref 3.5–5.1)
Sodium: 135 mmol/L (ref 135–145)
Total Bilirubin: 0.4 mg/dL (ref 0.3–1.2)
Total Protein: 7.6 g/dL (ref 6.5–8.1)

## 2020-12-16 LAB — TSH: TSH: 4.33 u[IU]/mL (ref 0.350–4.500)

## 2020-12-16 NOTE — Progress Notes (Unsigned)
I connected with Charles Mathis on 12/16/20 at 10:15 AM EDT by {Blank single:19197::"video enabled telemedicine visit","telephone visit"} and verified that I am speaking with the correct person using two identifiers.  I discussed the limitations, risks, security and privacy concerns of performing an evaluation and management service by telemedicine and the availability of in-person appointments. I also discussed with the patient that there may be a patient responsible charge related to this service. The patient expressed understanding and agreed to proceed.    Other persons participating in the visit and their role in the encounter: RN/medical reconciliation Patient's location: home Provider's location: office  Oncology History Overview Note  #July 2020-left kidney cancer-clear cell carcinoma; grade 2.  PT3a/stage III status post radical nephrectomy; [Dr. Sniskinski]; July CT 2020- mid anterior left kidney measuring 8.6 x 8.1 x 9.4 cm; CT chest negative  #April 06, 2019-Sutent 50 mg 2 weeks on 1 week off.   # DM- 2- OHA/ HTN  DIAGNOSIS: Left kidney cancer  STAGE: 3       ;GOALS: Cure  CURRENT/MOST RECENT THERAPY: Sutent    History of kidney cancer  02/26/2019 Initial Diagnosis   Cancer of left kidney [facility])      Chief Complaint: ***    History of present illness:Charles Mathis 50 y.o.  male with history of   Observation/objective: Alert & oriented x 3. In No acute distress.   Assessment and plan: No problem-specific Assessment & Plan notes found for this encounter.    Follow-up instructions:  I discussed the assessment and treatment plan with the patient.  The patient was provided an opportunity to ask questions and all were answered.  The patient agreed with the plan and demonstrated understanding of instructions.  The patient was advised to call back or seek an in person evaluation if the symptoms worsen or if the condition fails to improve as anticipated.  I  provided *** minutes of {Blank single:19197::"face-to-face video visit time","non face-to-face telephone visit time"} during this encounter, and > 50% was spent counseling as documented under my assessment & plan.   Dr. Charlaine Dalton [place] at [facility] 12/16/2020 12:13 PM

## 2020-12-16 NOTE — Assessment & Plan Note (Signed)
#  Kidney cancer-clear cell carcinoma.  Stage III, pT3N0- Currently receiving adjuvant Sutent 50 mg, 2 weeks on, 1 week off. CT AUG  2021 [Urology]-no evidence of any recurrence. STABLE.     #Recommend finishing off Sutent available pills.  Patient should have finished total 1 year of Sutent adjuvant therapy. No new refills recommended.  Recommend imaging every 6 months for now/urology; CT- march 2022.    # LFTs-AST ALT- 47/52-stable; G-1; secondary to Sutent.  Monitor closely asymptomatic.  # Hypothyroidism-  on synthroid 100 mcg/d; May 2021-TSH-Normal.STABLE.   # DISPOSITION: # follow up in June 1st week of June 2022- MD-labs- cbc/cmp/TSH- Dr.B

## 2021-01-10 ENCOUNTER — Other Ambulatory Visit: Payer: Self-pay

## 2021-01-12 ENCOUNTER — Telehealth: Payer: Self-pay | Admitting: Pharmacy Technician

## 2021-01-12 NOTE — Telephone Encounter (Signed)
Received updated proof of income.  Patient eligible to receive medication assistance at [facility] until time for re-certification in 2023, and as long as eligibility requirements continue to be met.  Charles Mathis [place] Care Manager [facility]  

## 2021-01-23 ENCOUNTER — Other Ambulatory Visit: Payer: Self-pay

## 2021-01-23 MED FILL — Atorvastatin Calcium Tab 40 MG (Base Equivalent): ORAL | 90 days supply | Qty: 90 | Fill #0 | Status: AC

## 2021-02-07 ENCOUNTER — Other Ambulatory Visit: Payer: Self-pay

## 2021-02-07 ENCOUNTER — Other Ambulatory Visit: Payer: Self-pay | Admitting: Unknown Physician Specialty

## 2021-02-07 DIAGNOSIS — I10 Essential (primary) hypertension: Secondary | ICD-10-CM

## 2021-02-07 MED ORDER — CHLORTHALIDONE 25 MG PO TABS
ORAL_TABLET | ORAL | 1 refills | Status: DC
  Filled 2021-02-07: qty 45, 90d supply, fill #0
  Filled 2021-05-21: qty 45, 90d supply, fill #1

## 2021-02-08 ENCOUNTER — Other Ambulatory Visit: Payer: Self-pay

## 2021-02-14 ENCOUNTER — Other Ambulatory Visit: Payer: Self-pay

## 2021-02-14 ENCOUNTER — Other Ambulatory Visit: Payer: Self-pay | Admitting: Internal Medicine

## 2021-02-14 DIAGNOSIS — E039 Hypothyroidism, unspecified: Secondary | ICD-10-CM

## 2021-02-14 MED FILL — Levothyroxine Sodium Tab 100 MCG: ORAL | 90 days supply | Qty: 90 | Fill #0 | Status: AC

## 2021-02-15 ENCOUNTER — Other Ambulatory Visit: Payer: Self-pay

## 2021-02-22 ENCOUNTER — Other Ambulatory Visit: Payer: Self-pay

## 2021-02-23 ENCOUNTER — Other Ambulatory Visit: Payer: Self-pay

## 2021-02-24 ENCOUNTER — Other Ambulatory Visit: Payer: Self-pay

## 2021-02-24 ENCOUNTER — Other Ambulatory Visit: Payer: Self-pay | Admitting: Internal Medicine

## 2021-02-24 DIAGNOSIS — I10 Essential (primary) hypertension: Secondary | ICD-10-CM

## 2021-02-24 MED ORDER — CARVEDILOL 25 MG PO TABS
ORAL_TABLET | ORAL | 0 refills | Status: DC
Start: 2021-02-24 — End: 2021-05-28
  Filled 2021-02-24: qty 180, 90d supply, fill #0

## 2021-03-04 ENCOUNTER — Other Ambulatory Visit: Payer: Self-pay | Admitting: Internal Medicine

## 2021-03-06 ENCOUNTER — Other Ambulatory Visit: Payer: Self-pay

## 2021-03-06 MED ORDER — MONTELUKAST SODIUM 10 MG PO TABS
10.0000 mg | ORAL_TABLET | Freq: Every day | ORAL | 0 refills | Status: DC
  Filled 2021-03-06: qty 90, 90d supply, fill #0

## 2021-03-07 ENCOUNTER — Other Ambulatory Visit: Payer: Self-pay

## 2021-03-08 ENCOUNTER — Other Ambulatory Visit: Payer: Self-pay

## 2021-03-21 ENCOUNTER — Other Ambulatory Visit: Payer: Self-pay

## 2021-03-21 ENCOUNTER — Telehealth: Payer: Self-pay

## 2021-03-21 NOTE — Telephone Encounter (Signed)
Called pt to inquire about CT scheduling as the radiology department has attempted to call him several times. No answer. Left message.

## 2021-03-22 ENCOUNTER — Ambulatory Visit: Payer: Self-pay | Admitting: Urology

## 2021-03-22 NOTE — Telephone Encounter (Signed)
MyChart message sent. Appt for today canceled.

## 2021-03-27 ENCOUNTER — Other Ambulatory Visit: Payer: Self-pay

## 2021-03-28 ENCOUNTER — Other Ambulatory Visit: Payer: Self-pay

## 2021-04-03 ENCOUNTER — Other Ambulatory Visit: Payer: Self-pay

## 2021-04-18 ENCOUNTER — Other Ambulatory Visit: Payer: Self-pay

## 2021-04-24 ENCOUNTER — Other Ambulatory Visit: Payer: Self-pay

## 2021-05-07 ENCOUNTER — Other Ambulatory Visit: Payer: Self-pay | Admitting: Unknown Physician Specialty

## 2021-05-07 DIAGNOSIS — E1165 Type 2 diabetes mellitus with hyperglycemia: Secondary | ICD-10-CM

## 2021-05-07 DIAGNOSIS — E785 Hyperlipidemia, unspecified: Secondary | ICD-10-CM

## 2021-05-08 ENCOUNTER — Other Ambulatory Visit: Payer: Self-pay

## 2021-05-08 MED ORDER — ATORVASTATIN CALCIUM 40 MG PO TABS
ORAL_TABLET | Freq: Every day | ORAL | 0 refills | Status: DC
  Filled 2021-05-08: qty 90, 90d supply, fill #0

## 2021-05-08 NOTE — Telephone Encounter (Signed)
Requested medication (s) are due for refill today: yes  Requested medication (s) are on the active medication list: yes  Last refill:  10/04/20 #90 1 RF  Future visit scheduled: yes  Notes to clinic:  overdue lab work    Requested Prescriptions  Pending Prescriptions Disp Refills   atorvastatin (LIPITOR) 40 MG tablet 90 tablet 1    Sig: TAKE ONE TABLET BY MOUTH EVERY DAY     Cardiovascular:  Antilipid - Statins Failed - 05/07/2021  1:36 PM      Failed - Total Cholesterol in normal range and within 360 days    Cholesterol  Date Value Ref Range Status  01/22/2020 154 0 - 200 mg/dL Final          Failed - LDL in normal range and within 360 days    LDL Cholesterol (Calc)  Date Value Ref Range Status  03/13/2018 65 mg/dL (calc) Final    Comment:    Reference range: <100 . Desirable range <100 mg/dL for primary prevention;   <70 mg/dL for patients with CHD or diabetic patients  with > or = 2 CHD risk factors. Marland Kitchen LDL-C is now calculated using the Martin-Hopkins  calculation, which is a validated novel method providing  better accuracy than the Friedewald equation in the  estimation of LDL-C.  Cresenciano Genre et al. Annamaria Helling. 4944;967(59): 2061-2068  (http://education.[employer].com/faq/FAQ164)    LDL Cholesterol  Date Value Ref Range Status  01/22/2020 59 0 - 99 mg/dL Final    Comment:           Total Cholesterol/HDL:CHD Risk Coronary Heart Disease Risk Table                     Men   Women  1/2 Average Risk   3.4   3.3  Average Risk       5.0   4.4  2 X Average Risk   9.6   7.1  3 X Average Risk  23.4   11.0        Use the calculated Patient Ratio above and the CHD Risk Table to determine the patient's CHD Risk.        ATP III CLASSIFICATION (LDL):  <100     mg/dL   Optimal  100-129  mg/dL   Near or Above                    Optimal  130-159  mg/dL   Borderline  160-189  mg/dL   High  >190     mg/dL   Very High Performed at [facility], [address] [place], [place] [address]           Failed - HDL in normal range and within 360 days    HDL  Date Value Ref Range Status  01/22/2020 29 (L) >40 mg/dL Final          Failed - Triglycerides in normal range and within 360 days    Triglycerides  Date Value Ref Range Status  01/22/2020 331 (H) <150 mg/dL Final          Passed - Patient is not pregnant      Passed - Valid encounter within last 12 months    Recent Outpatient Visits           4 months ago Type 2 diabetes mellitus with hyperglycemia, without long-term current use of insulin [facility])   [facility], Coralie Keens, NP  8 months ago Primary hypertension   [facility] Kathrine Haddock, NP   10 months ago Paresthesia of both hands   [facility], Lupita Raider, FNP   1 year ago Type 2 diabetes mellitus with hyperglycemia, without long-term current use of insulin [facility])   [facility], Lupita Raider, FNP   1 year ago Type 2 diabetes mellitus with hyperglycemia, without long-term current use of insulin [facility])   [facility], Lupita Raider, FNP       Future Appointments             In 1 month Baity, Coralie Keens, NP [facility], [facility]

## 2021-05-21 ENCOUNTER — Other Ambulatory Visit: Payer: Self-pay | Admitting: Internal Medicine

## 2021-05-21 DIAGNOSIS — E039 Hypothyroidism, unspecified: Secondary | ICD-10-CM

## 2021-05-21 MED ORDER — LEVOTHYROXINE SODIUM 100 MCG PO TABS
ORAL_TABLET | Freq: Every day | ORAL | 1 refills | Status: DC
  Filled 2021-05-21: qty 90, 90d supply, fill #0
  Filled 2021-08-07 – 2021-08-20 (×2): qty 90, 90d supply, fill #1

## 2021-05-21 NOTE — Telephone Encounter (Signed)
Requested Prescriptions  Pending Prescriptions Disp Refills  . levothyroxine (SYNTHROID) 100 MCG tablet 90 tablet 1    Sig: TAKE ONE TABLET BY MOUTH EVERY DAY     Endocrinology:  Hypothyroid Agents Failed - 05/21/2021  6:37 PM      Failed - TSH needs to be rechecked within 3 months after an abnormal result. Refill until TSH is due.      Passed - TSH in normal range and within 360 days    TSH  Date Value Ref Range Status  12/16/2020 4.330 0.350 - 4.500 uIU/mL Final    Comment:    Performed by a 3rd Generation assay with a functional sensitivity of <=0.01 uIU/mL. Performed at [facility], [address] [place], [place] [address]   03/13/2018 5.25 (H) 0.40 - 4.50 mIU/L Final         Passed - Valid encounter within last 12 months    Recent Outpatient Visits          5 months ago Type 2 diabetes mellitus with hyperglycemia, without long-term current use of insulin [facility])   [facility], Coralie Keens, NP   8 months ago Primary hypertension   [place], NP   11 months ago Paresthesia of both hands   [facility], Lupita Raider, FNP   1 year ago Type 2 diabetes mellitus with hyperglycemia, without long-term current use of insulin [facility])   [facility], Lupita Raider, FNP   1 year ago Type 2 diabetes mellitus with hyperglycemia, without long-term current use of insulin [facility])   [facility], Lupita Raider, FNP      Future Appointments            In 3 weeks Baity, Coralie Keens, NP [facility], [facility]

## 2021-05-22 ENCOUNTER — Other Ambulatory Visit: Payer: Self-pay

## 2021-05-28 ENCOUNTER — Other Ambulatory Visit: Payer: Self-pay | Admitting: Internal Medicine

## 2021-05-28 DIAGNOSIS — I10 Essential (primary) hypertension: Secondary | ICD-10-CM

## 2021-05-29 ENCOUNTER — Other Ambulatory Visit: Payer: Self-pay

## 2021-05-29 MED ORDER — CARVEDILOL 25 MG PO TABS
ORAL_TABLET | ORAL | 0 refills | Status: DC
  Filled 2021-05-29: qty 180, 90d supply, fill #0

## 2021-05-29 NOTE — Telephone Encounter (Signed)
Requested Prescriptions  Pending Prescriptions Disp Refills  . carvedilol (COREG) 25 MG tablet 180 tablet 0    Sig: TAKE 1 TABLET BY MOUTH TWICE A DAY WITH A MEAL.     Cardiovascular:  Beta Blockers Passed - 05/28/2021  5:57 PM      Passed - Last BP in normal range    BP Readings from Last 1 Encounters:  12/14/20 126/65         Passed - Last Heart Rate in normal range    Pulse Readings from Last 1 Encounters:  12/14/20 69         Passed - Valid encounter within last 6 months    Recent Outpatient Visits          5 months ago Type 2 diabetes mellitus with hyperglycemia, without long-term current use of insulin ([place])   [facility], Coralie Keens, NP   9 months ago Primary hypertension   [facility] Kathrine Haddock, NP   11 months ago Paresthesia of both hands   [facility], Lupita Raider, FNP   1 year ago Type 2 diabetes mellitus with hyperglycemia, without long-term current use of insulin [facility])   [facility], Lupita Raider, FNP   1 year ago Type 2 diabetes mellitus with hyperglycemia, without long-term current use of insulin [facility])   [facility], Lupita Raider, FNP      Future Appointments            In 2 weeks Baity, Coralie Keens, NP [facility], [facility]

## 2021-06-11 ENCOUNTER — Other Ambulatory Visit: Payer: Self-pay | Admitting: Internal Medicine

## 2021-06-11 ENCOUNTER — Other Ambulatory Visit: Payer: Self-pay

## 2021-06-12 ENCOUNTER — Other Ambulatory Visit: Payer: Self-pay

## 2021-06-12 MED ORDER — MONTELUKAST SODIUM 10 MG PO TABS
10.0000 mg | ORAL_TABLET | Freq: Every day | ORAL | 0 refills | Status: DC
  Filled 2021-06-12: qty 90, 90d supply, fill #0

## 2021-06-13 ENCOUNTER — Other Ambulatory Visit: Payer: Self-pay

## 2021-06-14 ENCOUNTER — Other Ambulatory Visit: Payer: Self-pay

## 2021-06-14 ENCOUNTER — Other Ambulatory Visit: Payer: Self-pay | Admitting: Internal Medicine

## 2021-06-14 DIAGNOSIS — I10 Essential (primary) hypertension: Secondary | ICD-10-CM

## 2021-06-15 ENCOUNTER — Other Ambulatory Visit: Payer: Self-pay

## 2021-06-15 ENCOUNTER — Encounter: Payer: Self-pay | Admitting: Internal Medicine

## 2021-06-15 MED ORDER — OLMESARTAN MEDOXOMIL 40 MG PO TABS
ORAL_TABLET | Freq: Every day | ORAL | 0 refills | Status: DC
  Filled 2021-06-15: qty 90, fill #0
  Filled 2021-06-24: qty 90, 90d supply, fill #0

## 2021-06-15 NOTE — Telephone Encounter (Signed)
Patient will need an office visit for further refills. Requested Prescriptions  Pending Prescriptions Disp Refills  . olmesartan (BENICAR) 40 MG tablet 90 tablet 0    Sig: TAKE ONE TABLET BY MOUTH EVERY DAY     Cardiovascular:  Angiotensin Receptor Blockers Failed - 06/14/2021  9:12 AM      Failed - Valid encounter within last 6 months    Recent Outpatient Visits          6 months ago Type 2 diabetes mellitus with hyperglycemia, without long-term current use of insulin [facility])   [facility], Coralie Keens, NP   9 months ago Primary hypertension   [place], NP   12 months ago Paresthesia of both hands   [facility], Lupita Raider, FNP   1 year ago Type 2 diabetes mellitus with hyperglycemia, without long-term current use of insulin [facility])   [facility], Lupita Raider, FNP   1 year ago Type 2 diabetes mellitus with hyperglycemia, without long-term current use of insulin [facility])   [facility], Lupita Raider, FNP             Passed - Cr in normal range and within 180 days    Creat  Date Value Ref Range Status  03/13/2018 0.91 0.60 - 1.35 mg/dL Final   Creatinine, Ser  Date Value Ref Range Status  12/16/2020 1.10 0.61 - 1.24 mg/dL Final         Passed - K in normal range and within 180 days    Potassium  Date Value Ref Range Status  12/16/2020 3.6 3.5 - 5.1 mmol/L Final  09/22/2012 3.9 3.5 - 5.1 mmol/L Final         Passed - Patient is not pregnant      Passed - Last BP in normal range    BP Readings from Last 1 Encounters:  12/14/20 126/65

## 2021-06-18 ENCOUNTER — Other Ambulatory Visit: Payer: Self-pay | Admitting: Internal Medicine

## 2021-06-18 DIAGNOSIS — I10 Essential (primary) hypertension: Secondary | ICD-10-CM

## 2021-06-19 ENCOUNTER — Other Ambulatory Visit: Payer: Self-pay

## 2021-06-19 MED ORDER — AMLODIPINE BESYLATE 10 MG PO TABS
ORAL_TABLET | Freq: Every day | ORAL | 0 refills | Status: DC
Start: 2021-06-19 — End: 2021-07-24
  Filled 2021-06-19: qty 30, 30d supply, fill #0

## 2021-06-19 NOTE — Telephone Encounter (Signed)
Courtesy refill. Called patient to schedule appt for med refills. No encounter within 6 months. No answer, left message on voicemail to call clinic back .  Requested Prescriptions  Pending Prescriptions Disp Refills  . amLODipine (NORVASC) 10 MG tablet 30 tablet 0    Sig: TAKE ONE TABLET BY MOUTH ONCE EVERY DAY.     Cardiovascular:  Calcium Channel Blockers Failed - 06/18/2021  5:44 PM      Failed - Valid encounter within last 6 months    Recent Outpatient Visits          6 months ago Type 2 diabetes mellitus with hyperglycemia, without long-term current use of insulin [facility])   [facility], Coralie Keens, NP   9 months ago Primary hypertension   [facility] Kathrine Haddock, NP   1 year ago Paresthesia of both hands   [facility], Lupita Raider, FNP   1 year ago Type 2 diabetes mellitus with hyperglycemia, without long-term current use of insulin [facility])   [facility], Lupita Raider, FNP   1 year ago Type 2 diabetes mellitus with hyperglycemia, without long-term current use of insulin [facility])   [facility], Lupita Raider, FNP             Passed - Last BP in normal range    BP Readings from Last 1 Encounters:  12/14/20 126/65

## 2021-06-19 NOTE — Telephone Encounter (Signed)
Called patient to schedule appt for further medication refills . No answer , left voicemail to call back clinic [phone].

## 2021-06-22 ENCOUNTER — Ambulatory Visit (INDEPENDENT_AMBULATORY_CARE_PROVIDER_SITE_OTHER): Payer: Self-pay | Admitting: Internal Medicine

## 2021-06-22 ENCOUNTER — Encounter: Payer: Self-pay | Admitting: Internal Medicine

## 2021-06-22 ENCOUNTER — Other Ambulatory Visit: Payer: Self-pay

## 2021-06-22 VITALS — BP 127/74 | HR 67 | Temp 97.5°F | Resp 17 | Ht 67.0 in | Wt 257.2 lb

## 2021-06-22 DIAGNOSIS — D649 Anemia, unspecified: Secondary | ICD-10-CM | POA: Insufficient documentation

## 2021-06-22 DIAGNOSIS — I2510 Atherosclerotic heart disease of native coronary artery without angina pectoris: Secondary | ICD-10-CM

## 2021-06-22 DIAGNOSIS — G4733 Obstructive sleep apnea (adult) (pediatric): Secondary | ICD-10-CM

## 2021-06-22 DIAGNOSIS — E1165 Type 2 diabetes mellitus with hyperglycemia: Secondary | ICD-10-CM

## 2021-06-22 DIAGNOSIS — Z85528 Personal history of other malignant neoplasm of kidney: Secondary | ICD-10-CM

## 2021-06-22 DIAGNOSIS — E785 Hyperlipidemia, unspecified: Secondary | ICD-10-CM

## 2021-06-22 DIAGNOSIS — I700 Atherosclerosis of aorta: Secondary | ICD-10-CM | POA: Insufficient documentation

## 2021-06-22 DIAGNOSIS — E039 Hypothyroidism, unspecified: Secondary | ICD-10-CM

## 2021-06-22 DIAGNOSIS — Z6836 Body mass index (BMI) 36.0-36.9, adult: Secondary | ICD-10-CM | POA: Insufficient documentation

## 2021-06-22 DIAGNOSIS — I10 Essential (primary) hypertension: Secondary | ICD-10-CM

## 2021-06-22 DIAGNOSIS — D509 Iron deficiency anemia, unspecified: Secondary | ICD-10-CM

## 2021-06-22 DIAGNOSIS — E6609 Other obesity due to excess calories: Secondary | ICD-10-CM | POA: Insufficient documentation

## 2021-06-22 LAB — POCT GLYCOSYLATED HEMOGLOBIN (HGB A1C): Hemoglobin A1C: 8 % — AB (ref 4.0–5.6)

## 2021-06-22 NOTE — Patient Instructions (Signed)

## 2021-06-22 NOTE — Assessment & Plan Note (Signed)
POCT A1C 8% Encouraged low carb diet and exercise for weight loss He does not want to increase his Metformin at this time Encouraged routine eye exam-he declines for financial reasons Encouraged routine foot exams He declines flu and pneumovax Encouraged him to get his covid booster

## 2021-06-22 NOTE — Progress Notes (Signed)
Subjective:    Patient ID: Charles Mathis, male    DOB: 1970/09/10, 50 y.o.   MRN: [id number]  HPI  Patient presents the clinic today for follow-up of chronic conditions.  HTN: His BP today is 127/74.  He is taking Amlodipine, Carvedilol, Chlorthalidone and Olmesartan as prescribed.  ECG from 01/2019 reviewed.  HLD with CAD/Aortic Atherosclerosis: His last LDL was 59, triglycerides 331, 01/2020.  He denies myalgias on Atorvastatin.  He does not consume a low-fat diet.  DM2: His last A1c was 6.4%, 12/2020.  He is taking Metformin as prescribed.  He does not check his sugars.  He does not check his feet routinely.  His last eye exam was more than 1 year ago.  Flu never.  Pneumovax never.  COVID Moderna x2.  Hypothyroidism: He denies any issues on his current dose of Levothyroxine.  He does not follow with endocrinology.  OSA: He averages 8 hours of sleep per night with the use of his CPAP.  There is no sleep study on file.  Anemia: His last H/H was 12.4/37.7, 12/2020. He is not taking any oral iron medication at this time.  Review of Systems     Past Medical History:  Diagnosis Date   Allergy    Diabetes mellitus without complication ([place])    Hypertension    Renal cancer, left ([place]) 01/2019   Left Nephrectomy    Current Outpatient Medications  Medication Sig Dispense Refill   acetaminophen (TYLENOL) 325 MG tablet Take 650 mg by mouth every 6 (six) hours as needed for headache.     amLODipine (NORVASC) 10 MG tablet TAKE ONE TABLET BY MOUTH ONCE EVERY DAY. 30 tablet 0   aspirin EC 81 MG tablet Take 81 mg by mouth daily.     atorvastatin (LIPITOR) 40 MG tablet TAKE ONE TABLET BY MOUTH EVERY DAY 90 tablet 0   carvedilol (COREG) 25 MG tablet TAKE 1 TABLET BY MOUTH TWICE A DAY WITH A MEAL. 180 tablet 0   chlorthalidone (HYGROTON) 25 MG tablet TAKE 1/2 TABLET (12.5 MG) BY MOUTH EVERY DAY 45 tablet 1   fluticasone (FLONASE) 50 MCG/ACT nasal spray Place 2 sprays into both nostrils daily.      levothyroxine (SYNTHROID) 100 MCG tablet TAKE ONE TABLET BY MOUTH EVERY DAY 90 tablet 1   metFORMIN (GLUCOPHAGE) 500 MG tablet TAKE ONE TABLET BY MOUTH 2 TIMES A DAY WITH A MEAL 180 tablet 1   montelukast (SINGULAIR) 10 MG tablet Take 1 tablet (10 mg total) by mouth once daily. 90 tablet 0   olmesartan (BENICAR) 40 MG tablet TAKE ONE TABLET BY MOUTH EVERY DAY 90 tablet 0   potassium chloride SA (KLOR-CON) 20 MEQ tablet TAKE HALF A TABLET BY MOUTH 2 TIMES A DAY 90 tablet 1   No current facility-administered medications for this visit.    No Known Allergies  Family History  Problem Relation Age of Onset   Diabetes Mother    Heart disease Mother    Mental illness Mother    Thyroid disease Mother    Heart disease Father    Diabetes Father    Mental illness Father     Social History   Socioeconomic History   Marital status: Married    Spouse name: Not on file   Number of children: Not on file   Years of education: Not on file   Highest education level: Not on file  Occupational History   Not on file  Tobacco Use  Smoking status: Never   Smokeless tobacco: Never  Vaping Use   Vaping Use: Never used  Substance and Sexual Activity   Alcohol use: No   Drug use: No   Sexual activity: Yes    Birth control/protection: Other-see comments    Comment: mutually monogamous relationship partner postmenopausal  Other Topics Concern   Not on file  Social History Narrative   In [place]/ with wife; son [downs syndrome]; no smoking/ no alcohol; works for [employer].    Social Determinants of Health   Financial Resource Strain: Not on file  Food Insecurity: Not on file  Transportation Needs: Not on file  Physical Activity: Not on file  Stress: Not on file  Social Connections: Not on file  Intimate Partner Violence: Not on file     Constitutional: Denies fever, malaise, fatigue, headache or abrupt weight changes.  HEENT: Denies eye pain, eye redness, ear pain,  ringing in the ears, wax buildup, runny nose, nasal congestion, bloody nose, or sore throat. Respiratory: Denies difficulty breathing, shortness of breath, cough or sputum production.   Cardiovascular: Denies chest pain, chest tightness, palpitations or swelling in the hands or feet.  Gastrointestinal: Denies abdominal pain, bloating, constipation, diarrhea or blood in the stool.  GU: Denies urgency, frequency, pain with urination, burning sensation, blood in urine, odor or discharge. Musculoskeletal: Denies decrease in range of motion, difficulty with gait, muscle pain or joint pain and swelling.  Skin: Denies redness, rashes, lesions or ulcercations.  Neurological: Denies dizziness, difficulty with memory, difficulty with speech or problems with balance and coordination.  Psych: Denies anxiety, depression, SI/HI.  No other specific complaints in a complete review of systems (except as listed in HPI above).  Objective:   Physical Exam  BP 127/74 (BP Location: Right Arm, Patient Position: Sitting, Cuff Size: Large)   Pulse 67   Temp (!) 97.5 F (36.4 C) (Temporal)   Resp 17   Ht 5\' 7"  (1.702 m)   Wt 257 lb 3.2 oz (116.7 kg)   SpO2 100%   BMI 40.28 kg/m  Wt Readings from Last 3 Encounters:  06/22/21 257 lb 3.2 oz (116.7 kg)  12/14/20 256 lb 3.2 oz (116.2 kg)  09/15/20 262 lb (118.8 kg)    General: Appears his stated age, obese, in NAD. Skin: Warm, dry and intact. No ulcerations noted. HEENT: Head: normal shape and size; Eyes: sclera white and EOMs intact;  Cardiovascular: Normal rate and rhythm. S1,S2 noted.  No murmur, rubs or gallops noted. No JVD or BLE edema. No carotid bruits noted. Pulmonary/Chest: Normal effort and positive vesicular breath sounds. No respiratory distress. No wheezes, rales or ronchi noted.  Musculoskeletal: No difficulty with gait.  Neurological: Alert and oriented.   BMET    Component Value Date/Time   NA 135 12/16/2020 0831   NA 143 01/24/2017 1306    NA 139 09/22/2012 1156   K 3.6 12/16/2020 0831   K 3.9 09/22/2012 1156   CL 99 12/16/2020 0831   CL 106 09/22/2012 1156   CO2 24 12/16/2020 0831   CO2 29 09/22/2012 1156   GLUCOSE 224 (H) 12/16/2020 0831   GLUCOSE 96 09/22/2012 1156   BUN 26 (H) 12/16/2020 0831   BUN 20 01/24/2017 1306   BUN 14 09/22/2012 1156   CREATININE 1.10 12/16/2020 0831   CREATININE 0.91 03/13/2018 0823   CALCIUM 9.1 12/16/2020 0831   CALCIUM 8.6 09/22/2012 1156   GFRNONAA >60 12/16/2020 0831   GFRNONAA 100 03/13/2018 8756  GFRAA >60 04/15/2020 0947   GFRAA 116 03/13/2018 0823    Lipid Panel     Component Value Date/Time   CHOL 154 01/22/2020 0934   TRIG 331 (H) 01/22/2020 0934   HDL 29 (L) 01/22/2020 0934   CHOLHDL 5.3 01/22/2020 0934   VLDL 66 (H) 01/22/2020 0934   LDLCALC 59 01/22/2020 0934   LDLCALC 65 03/13/2018 0823    CBC    Component Value Date/Time   WBC 7.5 12/16/2020 0831   RBC 4.69 12/16/2020 0831   HGB 12.4 (L) 12/16/2020 0831   HGB 12.5 (L) 09/22/2012 1156   HCT 37.7 (L) 12/16/2020 0831   HCT 38.1 (L) 09/22/2012 1156   PLT 235 12/16/2020 0831   PLT 275 09/22/2012 1156   MCV 80.4 12/16/2020 0831   MCV 81 09/22/2012 1156   MCH 26.4 12/16/2020 0831   MCHC 32.9 12/16/2020 0831   RDW 14.2 12/16/2020 0831   RDW 15.0 (H) 09/22/2012 1156   LYMPHSABS 1.8 12/16/2020 0831   MONOABS 0.8 12/16/2020 0831   EOSABS 0.6 (H) 12/16/2020 0831   BASOSABS 0.0 12/16/2020 0831    Hgb A1C Lab Results  Component Value Date   HGBA1C 6.4 (A) 12/14/2020            Assessment & Plan:     Webb Silversmith, NP This visit occurred during the SARS-CoV-2 public health emergency.  Safety protocols were in place, including screening questions prior to the visit, additional usage of staff PPE, and extensive cleaning of exam room while observing appropriate contact time as indicated for disinfecting solutions.

## 2021-06-22 NOTE — Assessment & Plan Note (Signed)
CMET and lipid profile today Encouraged him to consume a low fat diet Continue Atorvastatin, Amlodipine, Carvedilol, Chlorthalidone and Olmesartan

## 2021-06-22 NOTE — Assessment & Plan Note (Signed)
CMET and lipid profile today °Encouraged him to consume a low fat diet °Continue Atorvastatin °

## 2021-06-22 NOTE — Assessment & Plan Note (Signed)
In remission CMET today

## 2021-06-22 NOTE — Assessment & Plan Note (Signed)
TSH and free T4 today Will adjust Levothyroxine if needed based on labs 

## 2021-06-22 NOTE — Assessment & Plan Note (Signed)
Encouraged diet and exercise for weight loss ?

## 2021-06-22 NOTE — Assessment & Plan Note (Signed)
Controlled on Amlodipine, Carvedilol, Chlorthalidone and Olmesartan Reinforced DASH diet and exercise for weight loss CMET today

## 2021-06-22 NOTE — Assessment & Plan Note (Signed)
CBC today.  

## 2021-06-22 NOTE — Assessment & Plan Note (Signed)
Encouraged weight loss as this can help reduce sleep apnea symptoms Continue CPAP 

## 2021-06-25 ENCOUNTER — Other Ambulatory Visit: Payer: Self-pay | Admitting: Internal Medicine

## 2021-06-25 DIAGNOSIS — E1165 Type 2 diabetes mellitus with hyperglycemia: Secondary | ICD-10-CM

## 2021-06-25 MED ORDER — METFORMIN HCL 500 MG PO TABS
ORAL_TABLET | Freq: Two times a day (BID) | ORAL | 1 refills | Status: DC
  Filled 2021-06-25: qty 180, 90d supply, fill #0
  Filled 2021-09-24: qty 180, 90d supply, fill #1

## 2021-06-25 NOTE — Telephone Encounter (Signed)
Requested Prescriptions  Pending Prescriptions Disp Refills  . metFORMIN (GLUCOPHAGE) 500 MG tablet 180 tablet 1    Sig: TAKE ONE TABLET BY MOUTH 2 TIMES A DAY WITH A MEAL     Endocrinology:  Diabetes - Biguanides Failed - 06/25/2021  6:00 PM      Failed - HBA1C is between 0 and 7.9 and within 180 days    Hemoglobin A1C  Date Value Ref Range Status  06/22/2021 8.0 (A) 4.0 - 5.6 % Final   Hgb A1c MFr Bld  Date Value Ref Range Status  03/13/2018 6.5 (H) <5.7 % of total Hgb Final    Comment:    For someone without known diabetes, a hemoglobin A1c value of 6.5% or greater indicates that they may have  diabetes and this should be confirmed with a follow-up  test. . For someone with known diabetes, a value <7% indicates  that their diabetes is well controlled and a value  greater than or equal to 7% indicates suboptimal  control. A1c targets should be individualized based on  duration of diabetes, age, comorbid conditions, and  other considerations. . Currently, no consensus exists regarding use of hemoglobin A1c for diagnosis of diabetes for children. .          Passed - Cr in normal range and within 360 days    Creat  Date Value Ref Range Status  03/13/2018 0.91 0.60 - 1.35 mg/dL Final   Creatinine, Ser  Date Value Ref Range Status  12/16/2020 1.10 0.61 - 1.24 mg/dL Final         Passed - eGFR in normal range and within 360 days    GFR, Est African American  Date Value Ref Range Status  03/13/2018 116 > OR = 60 mL/min/1.35m Final   GFR calc Af Amer  Date Value Ref Range Status  04/15/2020 >60 >60 mL/min Final   GFR, Est Non African American  Date Value Ref Range Status  03/13/2018 100 > OR = 60 mL/min/1.768mFinal   GFR, Estimated  Date Value Ref Range Status  12/16/2020 >60 >60 mL/min Final    Comment:    (NOTE) Calculated using the CKD-EPI Creatinine Equation (2021)          Passed - Valid encounter within last 6 months    Recent Outpatient Visits           3 days ago Type 2 diabetes mellitus with hyperglycemia, without long-term current use of insulin [facility]   6 months ago Type 2 diabetes mellitus with hyperglycemia, without long-term current use of insulin [facility]  [facility]   9 months ago Primary hypertension   SoMignonNP   1 year ago Paresthesia of both hands   SoMilamFNP   1 year ago Type 2 diabetes mellitus with hyperglycemia, without long-term current use of insulin ([facility]  [facility]up[facility] RaiderFNN[place]

## 2021-06-26 ENCOUNTER — Other Ambulatory Visit: Payer: Self-pay

## 2021-06-27 ENCOUNTER — Other Ambulatory Visit: Payer: Self-pay

## 2021-07-03 ENCOUNTER — Other Ambulatory Visit: Payer: Self-pay

## 2021-07-09 ENCOUNTER — Other Ambulatory Visit: Payer: Self-pay | Admitting: Family Medicine

## 2021-07-09 DIAGNOSIS — I10 Essential (primary) hypertension: Secondary | ICD-10-CM

## 2021-07-11 ENCOUNTER — Other Ambulatory Visit: Payer: Self-pay

## 2021-07-11 MED ORDER — POTASSIUM CHLORIDE CRYS ER 20 MEQ PO TBCR
EXTENDED_RELEASE_TABLET | ORAL | 1 refills | Status: DC
  Filled 2021-07-11: qty 90, 90d supply, fill #0
  Filled 2021-09-24: qty 90, 90d supply, fill #1

## 2021-07-11 NOTE — Telephone Encounter (Signed)
Requested Prescriptions  Pending Prescriptions Disp Refills   potassium chloride SA (KLOR-CON M) 20 MEQ tablet 90 tablet 1    Sig: TAKE HALF A TABLET BY MOUTH 2 TIMES A DAY     Endocrinology:  Minerals - Potassium Supplementation Passed - 07/09/2021 11:55 AM      Passed - K in normal range and within 360 days    Potassium  Date Value Ref Range Status  12/16/2020 3.6 3.5 - 5.1 mmol/L Final  09/22/2012 3.9 3.5 - 5.1 mmol/L Final         Passed - Cr in normal range and within 360 days    Creat  Date Value Ref Range Status  03/13/2018 0.91 0.60 - 1.35 mg/dL Final   Creatinine, Ser  Date Value Ref Range Status  12/16/2020 1.10 0.61 - 1.24 mg/dL Final         Passed - Valid encounter within last 12 months    Recent Outpatient Visits          2 weeks ago Type 2 diabetes mellitus with hyperglycemia, without long-term current use of insulin ([place])   [facility], Coralie Keens, NP   6 months ago Type 2 diabetes mellitus with hyperglycemia, without long-term current use of insulin [facility])   [facility], Coralie Keens, NP   10 months ago Primary hypertension   [place], NP   1 year ago Paresthesia of both hands   [place], Lupita Raider, FNP   1 year ago Type 2 diabetes mellitus with hyperglycemia, without long-term current use of insulin [facility])   [facility], Lupita Raider, [place]

## 2021-07-14 ENCOUNTER — Other Ambulatory Visit: Payer: Self-pay

## 2021-07-24 ENCOUNTER — Other Ambulatory Visit: Payer: Self-pay | Admitting: Internal Medicine

## 2021-07-24 DIAGNOSIS — I10 Essential (primary) hypertension: Secondary | ICD-10-CM

## 2021-07-24 MED ORDER — AMLODIPINE BESYLATE 10 MG PO TABS
ORAL_TABLET | Freq: Every day | ORAL | 1 refills | Status: DC
  Filled 2021-07-24: qty 90, 90d supply, fill #0
  Filled 2021-10-22: qty 90, 90d supply, fill #1

## 2021-07-24 NOTE — Telephone Encounter (Signed)
Requested Prescriptions  Pending Prescriptions Disp Refills   amLODipine (NORVASC) 10 MG tablet 30 tablet 0    Sig: TAKE ONE TABLET BY MOUTH ONCE EVERY DAY.     Cardiovascular:  Calcium Channel Blockers Passed - 07/24/2021 11:12 AM      Passed - Last BP in normal range    BP Readings from Last 1 Encounters:  06/22/21 127/74         Passed - Valid encounter within last 6 months    Recent Outpatient Visits          1 month ago Type 2 diabetes mellitus with hyperglycemia, without long-term current use of insulin [facility])   [facility], Coralie Keens, NP   7 months ago Type 2 diabetes mellitus with hyperglycemia, without long-term current use of insulin [facility])   [facility], Coralie Keens, NP   10 months ago Primary hypertension   [place], NP   1 year ago Paresthesia of both hands   [place], FNP   1 year ago Type 2 diabetes mellitus with hyperglycemia, without long-term current use of insulin [facility])   [facility], Lupita Raider, [place]

## 2021-07-25 ENCOUNTER — Other Ambulatory Visit: Payer: Self-pay

## 2021-08-07 ENCOUNTER — Other Ambulatory Visit: Payer: Self-pay | Admitting: Internal Medicine

## 2021-08-07 DIAGNOSIS — E785 Hyperlipidemia, unspecified: Secondary | ICD-10-CM

## 2021-08-07 DIAGNOSIS — E1165 Type 2 diabetes mellitus with hyperglycemia: Secondary | ICD-10-CM

## 2021-08-07 DIAGNOSIS — I10 Essential (primary) hypertension: Secondary | ICD-10-CM

## 2021-08-07 MED ORDER — CHLORTHALIDONE 25 MG PO TABS
ORAL_TABLET | ORAL | 1 refills | Status: DC
  Filled 2021-08-07 – 2021-08-20 (×2): qty 45, 90d supply, fill #0
  Filled 2021-11-18: qty 45, 90d supply, fill #1

## 2021-08-07 NOTE — Telephone Encounter (Signed)
Requested Prescriptions  Pending Prescriptions Disp Refills   chlorthalidone (HYGROTON) 25 MG tablet 45 tablet 1    Sig: TAKE 1/2 TABLET (12.5 MG) BY MOUTH EVERY DAY     Cardiovascular: Diuretics - Thiazide Passed - 08/07/2021  4:23 PM      Passed - Ca in normal range and within 360 days    Calcium  Date Value Ref Range Status  12/16/2020 9.1 8.9 - 10.3 mg/dL Final   Calcium, Total  Date Value Ref Range Status  09/22/2012 8.6 8.5 - 10.1 mg/dL Final         Passed - Cr in normal range and within 360 days    Creat  Date Value Ref Range Status  03/13/2018 0.91 0.60 - 1.35 mg/dL Final   Creatinine, Ser  Date Value Ref Range Status  12/16/2020 1.10 0.61 - 1.24 mg/dL Final         Passed - K in normal range and within 360 days    Potassium  Date Value Ref Range Status  12/16/2020 3.6 3.5 - 5.1 mmol/L Final  09/22/2012 3.9 3.5 - 5.1 mmol/L Final         Passed - Na in normal range and within 360 days    Sodium  Date Value Ref Range Status  12/16/2020 135 135 - 145 mmol/L Final  01/24/2017 143 134 - 144 mmol/L Final  09/22/2012 139 136 - 145 mmol/L Final         Passed - Last BP in normal range    BP Readings from Last 1 Encounters:  06/22/21 127/74         Passed - Valid encounter within last 6 months    Recent Outpatient Visits          1 month ago Type 2 diabetes mellitus with hyperglycemia, without long-term current use of insulin ([place])   [facility] [place], Coralie Keens, NP   7 months ago Type 2 diabetes mellitus with hyperglycemia, without long-term current use of insulin [facility])   [facility] [place], Coralie Keens, NP   11 months ago Primary hypertension   [facility] Kathrine Haddock, NP   1 year ago Paresthesia of both hands   [facility], Lupita Raider, FNP   1 year ago Type 2 diabetes mellitus with hyperglycemia, without long-term current use of insulin [facility])   [facility], Lupita Raider,  FNP              atorvastatin (LIPITOR) 40 MG tablet 90 tablet 0    Sig: TAKE ONE TABLET BY MOUTH EVERY DAY     Cardiovascular:  Antilipid - Statins Failed - 08/07/2021  4:23 PM      Failed - Total Cholesterol in normal range and within 360 days    Cholesterol  Date Value Ref Range Status  01/22/2020 154 0 - 200 mg/dL Final         Failed - LDL in normal range and within 360 days    LDL Cholesterol (Calc)  Date Value Ref Range Status  03/13/2018 65 mg/dL (calc) Final    Comment:    Reference range: <100 . Desirable range <100 mg/dL for primary prevention;   <70 mg/dL for patients with CHD or diabetic patients  with > or = 2 CHD risk factors. Marland Kitchen LDL-C is now calculated using the Martin-Hopkins  calculation, which is a validated novel method providing  better accuracy than the Friedewald equation  in the  estimation of LDL-C.  Cresenciano Genre et al. Annamaria Helling. 1941;740(81): 2061-2068  (http://education.[employer].com/faq/FAQ164)    LDL Cholesterol  Date Value Ref Range Status  01/22/2020 59 0 - 99 mg/dL Final    Comment:           Total Cholesterol/HDL:CHD Risk Coronary Heart Disease Risk Table                     Men   Women  1/2 Average Risk   3.4   3.3  Average Risk       5.0   4.4  2 X Average Risk   9.6   7.1  3 X Average Risk  23.4   11.0        Use the calculated Patient Ratio above and the CHD Risk Table to determine the patient's CHD Risk.        ATP III CLASSIFICATION (LDL):  <100     mg/dL   Optimal  100-129  mg/dL   Near or Above                    Optimal  130-159  mg/dL   Borderline  160-189  mg/dL   High  >190     mg/dL   Very High Performed at [facility], [address] [place], [place] [address]          Failed - HDL in normal range and within 360 days    HDL  Date Value Ref Range Status  01/22/2020 29 (L) >40 mg/dL Final         Failed - Triglycerides in normal range and within 360 days    Triglycerides  Date Value Ref  Range Status  01/22/2020 331 (H) <150 mg/dL Final         Passed - Patient is not pregnant      Passed - Valid encounter within last 12 months    Recent Outpatient Visits          1 month ago Type 2 diabetes mellitus with hyperglycemia, without long-term current use of insulin [facility])   [facility] [place], [place], NP   7 months ago Type 2 diabetes mellitus with hyperglycemia, without long-term current use of insulin [facility])   [facility], Coralie Keens, NP   11 months ago Primary hypertension   [place], NP   1 year ago Paresthesia of both hands   [place], Lupita Raider, FNP   1 year ago Type 2 diabetes mellitus with hyperglycemia, without long-term current use of insulin [facility])   [facility], Lupita Raider,

## 2021-08-07 NOTE — Telephone Encounter (Signed)
Requested medications are due for refill today.  yes  Requested medications are on the active medications list.  yes  Last refill. 05/08/2021  Future visit scheduled.   no  Notes to clinic.  Failed protocol d/t expired labs.    Requested Prescriptions  Pending Prescriptions Disp Refills   atorvastatin (LIPITOR) 40 MG tablet 90 tablet 0    Sig: TAKE ONE TABLET BY MOUTH EVERY DAY     Cardiovascular:  Antilipid - Statins Failed - 08/07/2021  4:23 PM      Failed - Total Cholesterol in normal range and within 360 days    Cholesterol  Date Value Ref Range Status  01/22/2020 154 0 - 200 mg/dL Final          Failed - LDL in normal range and within 360 days    LDL Cholesterol (Calc)  Date Value Ref Range Status  03/13/2018 65 mg/dL (calc) Final    Comment:    Reference range: <100 . Desirable range <100 mg/dL for primary prevention;   <70 mg/dL for patients with CHD or diabetic patients  with > or = 2 CHD risk factors. Marland Kitchen LDL-C is now calculated using the Martin-Hopkins  calculation, which is a validated novel method providing  better accuracy than the Friedewald equation in the  estimation of LDL-C.  Cresenciano Genre et al. Annamaria Helling. 1610;960(45): 2061-2068  (http://education.[facility].com/faq/FAQ164)    LDL Cholesterol  Date Value Ref Range Status  01/22/2020 59 0 - 99 mg/dL Final    Comment:           Total Cholesterol/HDL:CHD Risk Coronary Heart Disease Risk Table                     Men   Women  1/2 Average Risk   3.4   3.3  Average Risk       5.0   4.4  2 X Average Risk   9.6   7.1  3 X Average Risk  23.4   11.0        Use the calculated Patient Ratio above and the CHD Risk Table to determine the patient's CHD Risk.        ATP III CLASSIFICATION (LDL):  <100     mg/dL   Optimal  100-129  mg/dL   Near or Above                    Optimal  130-159  mg/dL   Borderline  160-189  mg/dL   High  >190     mg/dL   Very High Performed at [facility], [address] [place], [place] [address]           Failed - HDL in normal range and within 360 days    HDL  Date Value Ref Range Status  01/22/2020 29 (L) >40 mg/dL Final          Failed - Triglycerides in normal range and within 360 days    Triglycerides  Date Value Ref Range Status  01/22/2020 331 (H) <150 mg/dL Final          Passed - Patient is not pregnant      Passed - Valid encounter within last 12 months    Recent Outpatient Visits           1 month ago Type 2 diabetes mellitus with hyperglycemia, without long-term current use of insulin [facility])   [facility] [place], Coralie Keens, NP  7 months ago Type 2 diabetes mellitus with hyperglycemia, without long-term current use of insulin ([place])   [facility] [place], Coralie Keens, NP   11 months ago Primary hypertension   [place], NP   1 year ago Paresthesia of both hands   [facility], Lupita Raider, FNP   1 year ago Type 2 diabetes mellitus with hyperglycemia, without long-term current use of insulin [facility])   [facility], Lupita Raider, [place]              Signed Prescriptions Disp Refills   chlorthalidone (HYGROTON) 25 MG tablet 45 tablet 1    Sig: TAKE 1/2 TABLET (12.5 MG) BY MOUTH EVERY DAY     Cardiovascular: Diuretics - Thiazide Passed - 08/07/2021  4:23 PM      Passed - Ca in normal range and within 360 days    Calcium  Date Value Ref Range Status  12/16/2020 9.1 8.9 - 10.3 mg/dL Final   Calcium, Total  Date Value Ref Range Status  09/22/2012 8.6 8.5 - 10.1 mg/dL Final          Passed - Cr in normal range and within 360 days    Creat  Date Value Ref Range Status  03/13/2018 0.91 0.60 - 1.35 mg/dL Final   Creatinine, Ser  Date Value Ref Range Status  12/16/2020 1.10 0.61 - 1.24 mg/dL Final          Passed - K in normal range and within 360 days    Potassium  Date Value Ref Range Status  12/16/2020 3.6 3.5  - 5.1 mmol/L Final  09/22/2012 3.9 3.5 - 5.1 mmol/L Final          Passed - Na in normal range and within 360 days    Sodium  Date Value Ref Range Status  12/16/2020 135 135 - 145 mmol/L Final  01/24/2017 143 134 - 144 mmol/L Final  09/22/2012 139 136 - 145 mmol/L Final          Passed - Last BP in normal range    BP Readings from Last 1 Encounters:  06/22/21 127/74          Passed - Valid encounter within last 6 months    Recent Outpatient Visits           1 month ago Type 2 diabetes mellitus with hyperglycemia, without long-term current use of insulin ([place])   [facility] [place], Coralie Keens, NP   7 months ago Type 2 diabetes mellitus with hyperglycemia, without long-term current use of insulin [facility])   [facility], NP   11 months ago Primary hypertension   [place], NP   1 year ago Paresthesia of both hands   [place], Lupita Raider, FNP   1 year ago Type 2 diabetes mellitus with hyperglycemia, without long-term current use of insulin [facility])   [facility], Lupita Raider, [place]

## 2021-08-08 ENCOUNTER — Other Ambulatory Visit: Payer: Self-pay

## 2021-08-08 NOTE — Telephone Encounter (Signed)
Pt needs to come have his labs drawn for cholesterol

## 2021-08-09 ENCOUNTER — Other Ambulatory Visit: Payer: Self-pay

## 2021-08-14 ENCOUNTER — Other Ambulatory Visit: Payer: Self-pay | Admitting: Internal Medicine

## 2021-08-14 DIAGNOSIS — E1165 Type 2 diabetes mellitus with hyperglycemia: Secondary | ICD-10-CM

## 2021-08-14 DIAGNOSIS — E785 Hyperlipidemia, unspecified: Secondary | ICD-10-CM

## 2021-08-15 ENCOUNTER — Other Ambulatory Visit: Payer: Self-pay

## 2021-08-15 MED ORDER — ATORVASTATIN CALCIUM 40 MG PO TABS
ORAL_TABLET | Freq: Every day | ORAL | 0 refills | Status: DC
  Filled 2021-08-15: qty 90, 90d supply, fill #0

## 2021-08-15 NOTE — Telephone Encounter (Signed)
Requested medication (s) are due for refill today:   Yes  Requested medication (s) are on the active medication list:   Yes  Future visit scheduled:   No  Seen a mo. ago   Last ordered: 05/08/2021 #90, 0 refills  Returned because protocol failed because labs due.   Last seen in Dec. 2022 and note was to continue atorvastatin.   Requested Prescriptions  Pending Prescriptions Disp Refills   atorvastatin (LIPITOR) 40 MG tablet 90 tablet 0    Sig: TAKE ONE TABLET BY MOUTH EVERY DAY     Cardiovascular:  Antilipid - Statins Failed - 08/14/2021 10:55 PM      Failed - Total Cholesterol in normal range and within 360 days    Cholesterol  Date Value Ref Range Status  01/22/2020 154 0 - 200 mg/dL Final          Failed - LDL in normal range and within 360 days    LDL Cholesterol (Calc)  Date Value Ref Range Status  03/13/2018 65 mg/dL (calc) Final    Comment:    Reference range: <100 . Desirable range <100 mg/dL for primary prevention;   <70 mg/dL for patients with CHD or diabetic patients  with > or = 2 CHD risk factors. Marland Kitchen LDL-C is now calculated using the Martin-Hopkins  calculation, which is a validated novel method providing  better accuracy than the Friedewald equation in the  estimation of LDL-C.  Cresenciano Genre et al. Annamaria Helling. 1914;782(95): 2061-2068  (http://education.[facility].com/faq/FAQ164)    LDL Cholesterol  Date Value Ref Range Status  01/22/2020 59 0 - 99 mg/dL Final    Comment:           Total Cholesterol/HDL:CHD Risk Coronary Heart Disease Risk Table                     Men   Women  1/2 Average Risk   3.4   3.3  Average Risk       5.0   4.4  2 X Average Risk   9.6   7.1  3 X Average Risk  23.4   11.0        Use the calculated Patient Ratio above and the CHD Risk Table to determine the patient's CHD Risk.        ATP III CLASSIFICATION (LDL):  <100     mg/dL   Optimal  100-129  mg/dL   Near or Above                    Optimal  130-159  mg/dL    Borderline  160-189  mg/dL   High  >190     mg/dL   Very High Performed at [facility], [address] [place], [place] [address]           Failed - HDL in normal range and within 360 days    HDL  Date Value Ref Range Status  01/22/2020 29 (L) >40 mg/dL Final          Failed - Triglycerides in normal range and within 360 days    Triglycerides  Date Value Ref Range Status  01/22/2020 331 (H) <150 mg/dL Final          Passed - Patient is not pregnant      Passed - Valid encounter within last 12 months    Recent Outpatient Visits           1 month ago Type  2 diabetes mellitus with hyperglycemia, without long-term current use of insulin [facility])   [facility] [place], [place], NP   8 months ago Type 2 diabetes mellitus with hyperglycemia, without long-term current use of insulin [facility])   [facility], Coralie Keens, NP   11 months ago Primary hypertension   [place], NP   1 year ago Paresthesia of both hands   [place], FNP   1 year ago Type 2 diabetes mellitus with hyperglycemia, without long-term current use of insulin [facility])   [facility], Lupita Raider, [place]

## 2021-08-21 ENCOUNTER — Other Ambulatory Visit: Payer: Self-pay

## 2021-09-03 ENCOUNTER — Other Ambulatory Visit: Payer: Self-pay | Admitting: Internal Medicine

## 2021-09-03 DIAGNOSIS — I10 Essential (primary) hypertension: Secondary | ICD-10-CM

## 2021-09-04 MED ORDER — CARVEDILOL 25 MG PO TABS
ORAL_TABLET | ORAL | 0 refills | Status: DC
  Filled 2021-09-04: qty 180, 90d supply, fill #0

## 2021-09-04 NOTE — Telephone Encounter (Signed)
Requested Prescriptions  Pending Prescriptions Disp Refills   carvedilol (COREG) 25 MG tablet 180 tablet 0    Sig: TAKE 1 TABLET BY MOUTH TWICE A DAY WITH A MEAL.     Cardiovascular: Beta Blockers 3 Passed - 09/03/2021  8:28 PM      Passed - Cr in normal range and within 360 days    Creat  Date Value Ref Range Status  03/13/2018 0.91 0.60 - 1.35 mg/dL Final   Creatinine, Ser  Date Value Ref Range Status  12/16/2020 1.10 0.61 - 1.24 mg/dL Final         Passed - AST in normal range and within 360 days    AST  Date Value Ref Range Status  12/16/2020 25 15 - 41 U/L Final   SGOT(AST)  Date Value Ref Range Status  09/22/2012 28 15 - 37 Unit/L Final         Passed - ALT in normal range and within 360 days    ALT  Date Value Ref Range Status  12/16/2020 30 0 - 44 U/L Final   SGPT (ALT)  Date Value Ref Range Status  09/22/2012 31 12 - 78 U/L Final         Passed - Last BP in normal range    BP Readings from Last 1 Encounters:  06/22/21 127/74         Passed - Last Heart Rate in normal range    Pulse Readings from Last 1 Encounters:  06/22/21 67         Passed - Valid encounter within last 6 months    Recent Outpatient Visits          2 months ago Type 2 diabetes mellitus with hyperglycemia, without long-term current use of insulin ([place])   [facility] [place], Coralie Keens, NP   8 months ago Type 2 diabetes mellitus with hyperglycemia, without long-term current use of insulin [facility])   [place], Coralie Keens, NP   1 year ago Primary hypertension   [facility] Kathrine Haddock, NP   1 year ago Paresthesia of both hands   [place], Lupita Raider, FNP   1 year ago Type 2 diabetes mellitus with hyperglycemia, without long-term current use of insulin [facility])   [facility], Lupita Raider, [place]

## 2021-09-05 ENCOUNTER — Other Ambulatory Visit: Payer: Self-pay

## 2021-09-10 ENCOUNTER — Other Ambulatory Visit: Payer: Self-pay | Admitting: Internal Medicine

## 2021-09-11 ENCOUNTER — Other Ambulatory Visit: Payer: Self-pay

## 2021-09-13 ENCOUNTER — Other Ambulatory Visit: Payer: Self-pay | Admitting: Internal Medicine

## 2021-09-14 ENCOUNTER — Other Ambulatory Visit: Payer: Self-pay

## 2021-09-14 MED ORDER — MONTELUKAST SODIUM 10 MG PO TABS
10.0000 mg | ORAL_TABLET | Freq: Every day | ORAL | 0 refills | Status: DC
  Filled 2021-09-14: qty 90, 90d supply, fill #0

## 2021-09-24 ENCOUNTER — Other Ambulatory Visit: Payer: Self-pay | Admitting: Internal Medicine

## 2021-09-24 DIAGNOSIS — I10 Essential (primary) hypertension: Secondary | ICD-10-CM

## 2021-09-25 ENCOUNTER — Other Ambulatory Visit: Payer: Self-pay

## 2021-09-25 MED ORDER — OLMESARTAN MEDOXOMIL 40 MG PO TABS
ORAL_TABLET | Freq: Every day | ORAL | 0 refills | Status: DC
  Filled 2021-09-25: qty 90, 90d supply, fill #0

## 2021-09-25 NOTE — Telephone Encounter (Signed)
Requested Prescriptions  ?Pending Prescriptions Disp Refills  ?? olmesartan (BENICAR) 40 MG tablet 90 tablet 0  ?  Sig: TAKE ONE TABLET BY MOUTH EVERY DAY  ?  ? Cardiovascular:  Angiotensin Receptor Blockers Failed - 09/24/2021 10:29 PM  ?  ?  Failed - Cr in normal range and within 180 days  ?  Creat  ?Date Value Ref Range Status  ?03/13/2018 0.91 0.60 - 1.35 mg/dL Final  ? ?Creatinine, Ser  ?Date Value Ref Range Status  ?12/16/2020 1.10 0.61 - 1.24 mg/dL Final  ?   ?  ?  Failed - K in normal range and within 180 days  ?  Potassium  ?Date Value Ref Range Status  ?12/16/2020 3.6 3.5 - 5.1 mmol/L Final  ?09/22/2012 3.9 3.5 - 5.1 mmol/L Final  ?   ?  ?  Passed - Patient is not pregnant  ?  ?  Passed - Last BP in normal range  ?  BP Readings from Last 1 Encounters:  ?06/22/21 127/74  ?   ?  ?  Passed - Valid encounter within last 6 months  ?  Recent Outpatient Visits   ?      ? 3 months ago Type 2 diabetes mellitus with hyperglycemia, without long-term current use of insulin ([place])  ? [facility] [place], [place], NP  ? 9 months ago Type 2 diabetes mellitus with hyperglycemia, without long-term current use of insulin ([place])  ? [facility] [place], Coralie Keens, NP  ? 1 year ago Primary hypertension  ? [facility] Kathrine Haddock, NP  ? 1 year ago Paresthesia of both hands  ? [place], FNP  ? 1 year ago Type 2 diabetes mellitus with hyperglycemia, without long-term current use of insulin ([place])  ? [facility], Lupita Raider, FNP  ?  ?  ? ?  ?  ?  ? ?

## 2021-10-23 ENCOUNTER — Other Ambulatory Visit: Payer: Self-pay

## 2021-11-05 ENCOUNTER — Other Ambulatory Visit: Payer: Self-pay | Admitting: Internal Medicine

## 2021-11-05 DIAGNOSIS — E785 Hyperlipidemia, unspecified: Secondary | ICD-10-CM

## 2021-11-05 DIAGNOSIS — E1165 Type 2 diabetes mellitus with hyperglycemia: Secondary | ICD-10-CM

## 2021-11-07 ENCOUNTER — Other Ambulatory Visit: Payer: Self-pay

## 2021-11-07 NOTE — Telephone Encounter (Signed)
Requested medication (s) are due for refill today: Yes ? ?Requested medication (s) are on the active medication list: Yes ? ?Last refill:  08/15/21 ? ?Future visit scheduled: No ? ?Notes to clinic:  Protocol indicates pt. Needs lab work. ? ? ? ?Requested Prescriptions  ?Pending Prescriptions Disp Refills  ? atorvastatin (LIPITOR) 40 MG tablet 90 tablet 0  ?  Sig: TAKE ONE TABLET BY MOUTH ONCE EVERY DAY.  ?  ? Cardiovascular:  Antilipid - Statins Failed - 11/05/2021  7:58 PM  ?  ?  Failed - Lipid Panel in normal range within the last 12 months  ?  Cholesterol  ?Date Value Ref Range Status  ?01/22/2020 154 0 - 200 mg/dL Final  ? ?LDL Cholesterol (Calc)  ?Date Value Ref Range Status  ?03/13/2018 65 mg/dL (calc) Final  ?  Comment:  ?  Reference range: <100 ?Marland Kitchen ?Desirable range <100 mg/dL for primary prevention;   ?<70 mg/dL for patients with CHD or diabetic patients  ?with > or = 2 CHD risk factors. ?. ?LDL-C is now calculated using the Martin-Hopkins  ?calculation, which is a validated novel method providing  ?better accuracy than the Friedewald equation in the  ?estimation of LDL-C.  ?Cresenciano Genre et al. Annamaria Helling. 4765;465(03): 2061-2068  ?(http://education.[facility].com/faq/FAQ164) ?  ? ?LDL Cholesterol  ?Date Value Ref Range Status  ?01/22/2020 59 0 - 99 mg/dL Final  ?  Comment:  ?         ?Total Cholesterol/HDL:CHD Risk ?Coronary Heart Disease Risk Table ?                    Men   Women ? 1/2 Average Risk   3.4   3.3 ? Average Risk       5.0   4.4 ? 2 X Average Risk   9.6   7.1 ? 3 X Average Risk  23.4   11.0 ?       ?Use the calculated Patient Ratio ?above and the CHD Risk Table ?to determine the patient's CHD Risk. ?       ?ATP III CLASSIFICATION (LDL): ? <100     mg/dL   Optimal ? 100-129  mg/dL   Near or Above ?                   Optimal ? 130-159  mg/dL   Borderline ? 160-189  mg/dL   High ? >190     mg/dL   Very High ?Performed at [facility], [address] [place], [place] [address] ?  ? ?HDL   ?Date Value Ref Range Status  ?01/22/2020 29 (L) >40 mg/dL Final  ? ?Triglycerides  ?Date Value Ref Range Status  ?01/22/2020 331 (H) <150 mg/dL Final  ? ?  ?  ?  Passed - Patient is not pregnant  ?  ?  Passed - Valid encounter within last 12 months  ?  Recent Outpatient Visits   ? ?      ? 4 months ago Type 2 diabetes mellitus with hyperglycemia, without long-term current use of insulin ([place])  ? [facility] [place], [place], NP  ? 10 months ago Type 2 diabetes mellitus with hyperglycemia, without long-term current use of insulin ([place])  ? [facility] [place], Coralie Keens, NP  ? 1 year ago Primary hypertension  ? [facility] Kathrine Haddock, NP  ? 1 year ago Paresthesia of both hands  ? [facility]  [place], Lupita Raider, FNP  ? 1 year ago Type 2 diabetes mellitus with hyperglycemia, without long-term current use of insulin ([place])  ? [facility], Lupita Raider, FNP  ? ?  ?  ? ? ?  ?  ?  ? ?

## 2021-11-10 ENCOUNTER — Encounter: Payer: Self-pay | Admitting: Internal Medicine

## 2021-11-10 ENCOUNTER — Telehealth: Payer: Self-pay | Admitting: Pharmacy Technician

## 2021-11-10 ENCOUNTER — Ambulatory Visit (INDEPENDENT_AMBULATORY_CARE_PROVIDER_SITE_OTHER): Payer: Self-pay | Admitting: Internal Medicine

## 2021-11-10 VITALS — BP 116/78 | HR 73 | Temp 97.8°F

## 2021-11-10 DIAGNOSIS — E785 Hyperlipidemia, unspecified: Secondary | ICD-10-CM

## 2021-11-10 DIAGNOSIS — E039 Hypothyroidism, unspecified: Secondary | ICD-10-CM

## 2021-11-10 DIAGNOSIS — I10 Essential (primary) hypertension: Secondary | ICD-10-CM

## 2021-11-10 DIAGNOSIS — I2510 Atherosclerotic heart disease of native coronary artery without angina pectoris: Secondary | ICD-10-CM

## 2021-11-10 DIAGNOSIS — G4733 Obstructive sleep apnea (adult) (pediatric): Secondary | ICD-10-CM

## 2021-11-10 DIAGNOSIS — I700 Atherosclerosis of aorta: Secondary | ICD-10-CM

## 2021-11-10 DIAGNOSIS — D509 Iron deficiency anemia, unspecified: Secondary | ICD-10-CM

## 2021-11-10 DIAGNOSIS — E1165 Type 2 diabetes mellitus with hyperglycemia: Secondary | ICD-10-CM

## 2021-11-10 NOTE — Assessment & Plan Note (Signed)
Encourage diet and exercise for weight loss 

## 2021-11-10 NOTE — Assessment & Plan Note (Signed)
TSH and free T4 today We will adjust Levothyroxine if needed based on labs 

## 2021-11-10 NOTE — Assessment & Plan Note (Signed)
Encourage weight loss as this can help reduce sleep apnea symptoms 

## 2021-11-10 NOTE — Assessment & Plan Note (Signed)
A1c and urine microalbumin today ?Encouraged him to consume a low carb diet and exercise for weight loss ?Continue Metformin ?He declines eye exam due to lack of insurance ?Encourage routine foot exam ?He declines immunizations due to lack of insurance ?

## 2021-11-10 NOTE — Progress Notes (Signed)
? ?Subjective:  ? ? Patient ID: Charles Mathis, male    DOB: 1970/09/14, 51 y.o.   MRN: [id number] ? ?HPI ? ?Patient presents to clinic today for 40-monthfollow-up of chronic conditions. ? ?HTN: His BP today is 116/78.  He is taking Amlodipine, Carvedilol, Chlorthalidone and Olmesartan as prescribed.  ECG from 01/2019 reviewed. ? ?HLD with CAD/Aortic Atherosclerosis: His last LDL was 59, triglycerides 331, 01/2020.  He denies myalgias on Atorvastatin.  He is taking an Aspirin.  He does not consume a low-fat diet. ? ?DM2: His last A1c was 8%, 06/2021.  He is taking Metformin as prescribed.  He does not check his sugars.  He does not check his feet routinely.  His last eye exam was > 1 year ago.  Flu never.  Pneumovax never.  COVID Moderna x2. ? ?Hypothyroidism: He denies any issues on his current dose of Levothyroxine.  He does not follow with endocrinology. ? ?OSA: He averages 8 hours of sleep per night without the use of CPAP.  There is no sleep study on file. ? ?Anemia: His last H/H was 12.4/37.7, 12/2020.  He is not taking any oral iron medication at this time.  He does not follow with hematology. ? ? ?Review of Systems ? ?   ?Past Medical History:  ?Diagnosis Date  ? Allergy   ? Diabetes mellitus without complication ([place]   ? Hypertension   ? Renal cancer, left ([place] 01/2019  ? Left Nephrectomy  ? ? ?Current Outpatient Medications  ?Medication Sig Dispense Refill  ? acetaminophen (TYLENOL) 325 MG tablet Take 650 mg by mouth every 6 (six) hours as needed for headache.    ? amLODipine (NORVASC) 10 MG tablet TAKE ONE TABLET BY MOUTH ONCE EVERY DAY. 90 tablet 1  ? aspirin EC 81 MG tablet Take 81 mg by mouth daily.    ? atorvastatin (LIPITOR) 40 MG tablet TAKE ONE TABLET BY MOUTH ONCE EVERY DAY. 90 tablet 0  ? carvedilol (COREG) 25 MG tablet TAKE 1 TABLET BY MOUTH TWICE A DAY WITH A MEAL. 180 tablet 0  ? chlorthalidone (HYGROTON) 25 MG tablet TAKE 1/2 TABLET (12.5 MG) BY MOUTH ONCE EVERY DAY. 45 tablet 1  ?  fluticasone (FLONASE) 50 MCG/ACT nasal spray Place 2 sprays into both nostrils daily.    ? levothyroxine (SYNTHROID) 100 MCG tablet TAKE ONE TABLET BY MOUTH EVERY DAY 90 tablet 1  ? metFORMIN (GLUCOPHAGE) 500 MG tablet TAKE ONE TABLET BY MOUTH 2 TIMES A DAY WITH A MEAL 180 tablet 1  ? montelukast (SINGULAIR) 10 MG tablet Take 1 tablet (10 mg total) by mouth once daily. 90 tablet 0  ? olmesartan (BENICAR) 40 MG tablet TAKE ONE TABLET BY MOUTH ONCE EVERY DAY. 90 tablet 0  ? potassium chloride SA (KLOR-CON M) 20 MEQ tablet TAKE 0.5 TABLET (10 MEQ TOTAL) BY MOUTH 2 TIMES A DAY 90 tablet 1  ? ?No current facility-administered medications for this visit.  ? ? ?No Known Allergies ? ?Family History  ?Problem Relation Age of Onset  ? Diabetes Mother   ? Heart disease Mother   ? Mental illness Mother   ? Thyroid disease Mother   ? Heart disease Father   ? Diabetes Father   ? Mental illness Father   ? ? ?Social History  ? ?Socioeconomic History  ? Marital status: Married  ?  Spouse name: Not on file  ? Number of children: Not on file  ? Years of education: Not on file  ?  Highest education level: Not on file  ?Occupational History  ? Not on file  ?Tobacco Use  ? Smoking status: Never  ? Smokeless tobacco: Never  ?Vaping Use  ? Vaping Use: Never used  ?Substance and Sexual Activity  ? Alcohol use: No  ? Drug use: No  ? Sexual activity: Yes  ?  Birth control/protection: Other-see comments  ?  Comment: mutually monogamous relationship partner postmenopausal  ?Other Topics Concern  ? Not on file  ?Social History Narrative  ? In [place]/ with wife; son [downs syndrome]; no smoking/ no alcohol; works for [employer].   ? ?Social Determinants of Health  ? ?Financial Resource Strain: Not on file  ?Food Insecurity: Not on file  ?Transportation Needs: Not on file  ?Physical Activity: Not on file  ?Stress: Not on file  ?Social Connections: Not on file  ?Intimate Partner Violence: Not on file  ? ? ? ?Constitutional: Denies  fever, malaise, fatigue, headache or abrupt weight changes.  ?Respiratory: Denies difficulty breathing, shortness of breath, cough or sputum production.   ?Cardiovascular: Patient reports intermittent swelling in legs.  Denies chest pain, chest tightness, palpitations or swelling in the hands.  ?GU: Denies urgency, frequency, pain with urination, burning sensation, blood in urine, odor or discharge. ?Musculoskeletal: Denies decrease in range of motion, difficulty with gait, muscle pain or joint pain and swelling.  ?Skin: Denies redness, rashes, lesions or ulcercations.  ?Neurological: Denies dizziness, difficulty with memory, difficulty with speech or problems with balance and coordination.  ? ? ?No other specific complaints in a complete review of systems (except as listed in HPI above). ? ?Objective:  ? Physical Exam ? ?BP 116/78 (BP Location: Left Arm, Patient Position: Sitting, Cuff Size: Large)   Pulse 73   Temp 97.8 ?F (36.6 ?C) (Temporal)   SpO2 97%  ? ?Wt Readings from Last 3 Encounters:  ?06/22/21 257 lb 3.2 oz (116.7 kg)  ?12/14/20 256 lb 3.2 oz (116.2 kg)  ?09/15/20 262 lb (118.8 kg)  ? ? ?General: Appears his stated age, obese, in NAD. ?Skin: Warm, dry and intact. No ulcerations noted. ?HEENT: Head: normal shape and size; Eyes: sclera white, no icterus, conjunctiva pink, PERRLA and EOMs intact;  ?Cardiovascular: Normal rate and rhythm. S1,S2 noted.  No murmur, rubs or gallops noted.  Trace nonpitting BLE edema. No carotid bruits noted. ?Pulmonary/Chest: Normal effort and positive vesicular breath sounds. No respiratory distress. No wheezes, rales or ronchi noted.  ?Musculoskeletal: No difficulty with gait.  ?Neurological: Alert and oriented. ? ?BMET ?   ?Component Value Date/Time  ? NA 135 12/16/2020 0831  ? NA 143 01/24/2017 1306  ? NA 139 09/22/2012 1156  ? K 3.6 12/16/2020 0831  ? K 3.9 09/22/2012 1156  ? CL 99 12/16/2020 0831  ? CL 106 09/22/2012 1156  ? CO2 24 12/16/2020 0831  ? CO2 29 09/22/2012  1156  ? GLUCOSE 224 (H) 12/16/2020 0831  ? GLUCOSE 96 09/22/2012 1156  ? BUN 26 (H) 12/16/2020 0831  ? BUN 20 01/24/2017 1306  ? BUN 14 09/22/2012 1156  ? CREATININE 1.10 12/16/2020 0831  ? CREATININE 0.91 03/13/2018 0823  ? CALCIUM 9.1 12/16/2020 0831  ? CALCIUM 8.6 09/22/2012 1156  ? GFRNONAA >60 12/16/2020 0831  ? GFRNONAA 100 03/13/2018 0823  ? GFRAA >60 04/15/2020 0947  ? GFRAA 116 03/13/2018 0823  ? ? ?Lipid Panel  ?   ?Component Value Date/Time  ? CHOL 154 01/22/2020 0934  ? TRIG 331 (H) 01/22/2020 0934  ?  HDL 29 (L) 01/22/2020 0934  ? CHOLHDL 5.3 01/22/2020 0934  ? VLDL 66 (H) 01/22/2020 0934  ? [place] 59 01/22/2020 0934  ? [place] 65 03/13/2018 0823  ? ? ?CBC ?   ?Component Value Date/Time  ? WBC 7.5 12/16/2020 0831  ? RBC 4.69 12/16/2020 0831  ? HGB 12.4 (L) 12/16/2020 0831  ? HGB 12.5 (L) 09/22/2012 1156  ? HCT 37.7 (L) 12/16/2020 0831  ? HCT 38.1 (L) 09/22/2012 1156  ? PLT 235 12/16/2020 0831  ? PLT 275 09/22/2012 1156  ? MCV 80.4 12/16/2020 0831  ? MCV 81 09/22/2012 1156  ? MCH 26.4 12/16/2020 0831  ? MCHC 32.9 12/16/2020 0831  ? RDW 14.2 12/16/2020 0831  ? RDW 15.0 (H) 09/22/2012 1156  ? LYMPHSABS 1.8 12/16/2020 0831  ? MONOABS 0.8 12/16/2020 0831  ? EOSABS 0.6 (H) 12/16/2020 0831  ? BASOSABS 0.0 12/16/2020 0831  ? ? ?Hgb A1C ?Lab Results  ?Component Value Date  ? HGBA1C 8.0 (A) 06/22/2021  ? ? ? ? ? ? ?   ?Assessment & Plan:  ? ? ? ?Webb Silversmith, NP ? ?

## 2021-11-10 NOTE — Assessment & Plan Note (Signed)
No angina ?Encouraged him to consume low-fat diet ?Continue Atorvastatin, Aspirin, Carvedilol ?

## 2021-11-10 NOTE — Telephone Encounter (Signed)
Received updated proof of income.  Patient eligible to receive medication assistance at [facility] until time for re-certification in 2024, and as long as eligibility requirements continue to be met. ? ?Charles Mathis J. Apollos Tenbrink ?Care Manager ?[facility]  ?

## 2021-11-10 NOTE — Assessment & Plan Note (Signed)
Will defer CBC due to lack of insurance ?

## 2021-11-10 NOTE — Assessment & Plan Note (Signed)
C-Met and lipid profile today ?Encouraged him to consume a low-fat diet ?Continue Atorvastatin and Aspirin ?

## 2021-11-10 NOTE — Assessment & Plan Note (Signed)
C-Met and lipid profile today ?Encouraged him to consume a low-fat diet ?Continue Atorvastatin ?

## 2021-11-10 NOTE — Assessment & Plan Note (Signed)
Controlled on Amlodipine, Carvedilol, Chlorthalidone and Olmesartan ?Encouraged him to consume a low-salt diet and exercise for weight loss ?We will monitor ?

## 2021-11-15 ENCOUNTER — Other Ambulatory Visit: Payer: Self-pay

## 2021-11-17 ENCOUNTER — Other Ambulatory Visit: Payer: Self-pay

## 2021-11-17 ENCOUNTER — Telehealth: Payer: Self-pay | Admitting: Urology

## 2021-11-17 ENCOUNTER — Telehealth: Payer: Self-pay

## 2021-11-17 DIAGNOSIS — E1165 Type 2 diabetes mellitus with hyperglycemia: Secondary | ICD-10-CM

## 2021-11-17 DIAGNOSIS — C642 Malignant neoplasm of left kidney, except renal pelvis: Secondary | ICD-10-CM

## 2021-11-17 DIAGNOSIS — E039 Hypothyroidism, unspecified: Secondary | ICD-10-CM

## 2021-11-17 NOTE — Telephone Encounter (Signed)
Copied from [place] [phone]. Topic: General - Other ?>> Nov 17, 2021  8:50 AM Tessa Lerner A wrote: ?Reason for CRM: The patient has called to request that their previous orders for lab work be sent to [facility]  ? ?Labwork for the patient's Lipid panel, urine ratio, t4, hemoglobin, A1C, and tsh were supposed to be scheduled  ? ?Please contact the patient further when possible ?

## 2021-11-17 NOTE — Addendum Note (Signed)
Addended by: Jearld Fenton on: 11/17/2021 11:25 AM ? ? Modules accepted: Orders ? ?

## 2021-11-17 NOTE — Telephone Encounter (Signed)
Charles Penna Doi  Patient Appointment Schedule Request Pool 10 hours ago (10:31 PM)  ? ?Appointment Request From: Charles Mathis ?  ?With Provider: Billey Co, MD [[facility]] ?  ?Preferred Date Range: Any ?  ?Preferred Times: Any Time ?  ?Reason for visit: Office Visit ?  ?Comments: ?Follow up ? ? ?Per [facility], patient needs a CT first. She has put the order in. Patient is of understanding of CT before Appt. ?

## 2021-11-17 NOTE — Telephone Encounter (Signed)
Pt advised.   Thanks,   -Kileigh Ortmann  

## 2021-11-17 NOTE — Telephone Encounter (Signed)
Labs ordered.

## 2021-11-18 ENCOUNTER — Other Ambulatory Visit: Payer: Self-pay | Admitting: Internal Medicine

## 2021-11-18 DIAGNOSIS — E1165 Type 2 diabetes mellitus with hyperglycemia: Secondary | ICD-10-CM

## 2021-11-18 DIAGNOSIS — E039 Hypothyroidism, unspecified: Secondary | ICD-10-CM

## 2021-11-18 DIAGNOSIS — E785 Hyperlipidemia, unspecified: Secondary | ICD-10-CM

## 2021-11-20 ENCOUNTER — Other Ambulatory Visit: Payer: Self-pay

## 2021-11-20 NOTE — Telephone Encounter (Signed)
Has he had his labs done, I'm not going to keep refilling his meds if he refuses to have his labs checked. ?

## 2021-11-20 NOTE — Telephone Encounter (Signed)
Requested medication (s) are due for refill today: yes ? ?Requested medication (s) are on the active medication list: yes ? ?Last refill:  atorvastatin 08/15/21 #90/0, levothyroxine 05/21/21 #90/1 ? ?Future visit scheduled: no had appt 1 week ago ? ?Notes to clinic:  Unable to refill per protocol due to failed labs, no updated results. ? ? ? ?  ?Requested Prescriptions  ?Pending Prescriptions Disp Refills  ? levothyroxine (SYNTHROID) 100 MCG tablet 90 tablet 1  ?  Sig: TAKE ONE TABLET BY MOUTH EVERY DAY  ?  ? Endocrinology:  Hypothyroid Agents Passed - 11/18/2021  6:26 PM  ?  ?  Passed - TSH in normal range and within 360 days  ?  TSH  ?Date Value Ref Range Status  ?12/16/2020 4.330 0.350 - 4.500 uIU/mL Final  ?  Comment:  ?  Performed by a 3rd Generation assay with a functional sensitivity of <=0.01 uIU/mL. ?Performed at [facility], [address] [place], [place] [address] ?  ?03/13/2018 5.25 (H) 0.40 - 4.50 mIU/L Final  ?  ?  ?  ?  Passed - Valid encounter within last 12 months  ?  Recent Outpatient Visits   ? ?      ? 1 week ago Type 2 diabetes mellitus with hyperglycemia, without long-term current use of insulin ([place])  ? [facility] [place], [place] W, NP  ? 5 months ago Type 2 diabetes mellitus with hyperglycemia, without long-term current use of insulin ([place])  ? [facility] [place], [place] W, NP  ? 11 months ago Type 2 diabetes mellitus with hyperglycemia, without long-term current use of insulin ([place])  ? [facility] [place], Coralie Keens, NP  ? 1 year ago Primary hypertension  ? [facility] Kathrine Haddock, NP  ? 1 year ago Paresthesia of both hands  ? [facility] [place], Lupita Raider, FNP  ? ?  ?  ? ? ?  ?  ?  ? atorvastatin (LIPITOR) 40 MG tablet 90 tablet 0  ?  Sig: TAKE ONE TABLET BY MOUTH ONCE EVERY DAY.  ?  ? Cardiovascular:  Antilipid - Statins Failed - 11/18/2021  6:26 PM  ?  ?  Failed - Lipid Panel in normal range within the last  12 months  ?  Cholesterol  ?Date Value Ref Range Status  ?01/22/2020 154 0 - 200 mg/dL Final  ? ?LDL Cholesterol (Calc)  ?Date Value Ref Range Status  ?03/13/2018 65 mg/dL (calc) Final  ?  Comment:  ?  Reference range: <100 ?Marland Kitchen ?Desirable range <100 mg/dL for primary prevention;   ?<70 mg/dL for patients with CHD or diabetic patients  ?with > or = 2 CHD risk factors. ?. ?LDL-C is now calculated using the Martin-Hopkins  ?calculation, which is a validated novel method providing  ?better accuracy than the Friedewald equation in the  ?estimation of LDL-C.  ?Cresenciano Genre et al. Annamaria Helling. 4782;956(21): 2061-2068  ?(http://education.QuestDiagnostics.com/faq/FAQ164) ?  ? ?LDL Cholesterol  ?Date Value Ref Range Status  ?01/22/2020 59 0 - 99 mg/dL Final  ?  Comment:  ?         ?Total Cholesterol/HDL:CHD Risk ?Coronary Heart Disease Risk Table ?                    Men   Women ? 1/2 Average Risk   3.4   3.3 ? Average Risk       5.0   4.4 ? 2  X Average Risk   9.6   7.1 ? 3 X Average Risk  23.4   11.0 ?       ?Use the calculated Patient Ratio ?above and the CHD Risk Table ?to determine the patient's CHD Risk. ?       ?ATP III CLASSIFICATION (LDL): ? <100     mg/dL   Optimal ? 100-129  mg/dL   Near or Above ?                   Optimal ? 130-159  mg/dL   Borderline ? 160-189  mg/dL   High ? >190     mg/dL   Very High ?Performed at [facility], [address] [place], [place] [address] ?  ? ?HDL  ?Date Value Ref Range Status  ?01/22/2020 29 (L) >40 mg/dL Final  ? ?Triglycerides  ?Date Value Ref Range Status  ?01/22/2020 331 (H) <150 mg/dL Final  ? ?  ?  ?  Passed - Patient is not pregnant  ?  ?  Passed - Valid encounter within last 12 months  ?  Recent Outpatient Visits   ? ?      ? 1 week ago Type 2 diabetes mellitus with hyperglycemia, without long-term current use of insulin ([place])  ? [facility] [place], [place] W, NP  ? 5 months ago Type 2 diabetes mellitus with hyperglycemia, without long-term current use of  insulin ([place])  ? [facility] [place], [place] W, NP  ? 11 months ago Type 2 diabetes mellitus with hyperglycemia, without long-term current use of insulin ([place])  ? [facility] [place], Coralie Keens, NP  ? 1 year ago Primary hypertension  ? [facility] Kathrine Haddock, NP  ? 1 year ago Paresthesia of both hands  ? [facility], Lupita Raider, FNP  ? ?  ?  ? ? ?  ?  ?  ? ?

## 2021-11-21 ENCOUNTER — Other Ambulatory Visit
Admission: RE | Admit: 2021-11-21 | Discharge: 2021-11-21 | Disposition: A | Discharge status: Home / Self Care | Payer: Self-pay | Source: Ambulatory Visit | Attending: Internal Medicine | Admitting: Internal Medicine

## 2021-11-21 ENCOUNTER — Other Ambulatory Visit: Payer: Self-pay

## 2021-11-21 DIAGNOSIS — E039 Hypothyroidism, unspecified: Secondary | ICD-10-CM | POA: Insufficient documentation

## 2021-11-21 DIAGNOSIS — E1165 Type 2 diabetes mellitus with hyperglycemia: Secondary | ICD-10-CM | POA: Insufficient documentation

## 2021-11-21 LAB — CBC
HCT: 37.5 % — ABNORMAL LOW (ref 39.0–52.0)
Hemoglobin: 11.8 g/dL — ABNORMAL LOW (ref 13.0–17.0)
MCH: 24.5 pg — ABNORMAL LOW (ref 26.0–34.0)
MCHC: 31.5 g/dL (ref 30.0–36.0)
MCV: 78 fL — ABNORMAL LOW (ref 80.0–100.0)
Platelets: 244 10*3/uL (ref 150–400)
RBC: 4.81 MIL/uL (ref 4.22–5.81)
RDW: 14.8 % (ref 11.5–15.5)
WBC: 7.1 10*3/uL (ref 4.0–10.5)
nRBC: 0 % (ref 0.0–0.2)

## 2021-11-21 LAB — COMPREHENSIVE METABOLIC PANEL
ALT: 31 U/L (ref 0–44)
AST: 28 U/L (ref 15–41)
Albumin: 4 g/dL (ref 3.5–5.0)
Alkaline Phosphatase: 82 U/L (ref 38–126)
Anion gap: 8 (ref 5–15)
BUN: 21 mg/dL — ABNORMAL HIGH (ref 6–20)
CO2: 26 mmol/L (ref 22–32)
Calcium: 9.3 mg/dL (ref 8.9–10.3)
Chloride: 103 mmol/L (ref 98–111)
Creatinine, Ser: 1.1 mg/dL (ref 0.61–1.24)
GFR, Estimated: >60 mL/min (ref >60)
Glucose, Bld: 147 mg/dL — ABNORMAL HIGH (ref 70–99)
Potassium: 3.4 mmol/L — ABNORMAL LOW (ref 3.5–5.1)
Sodium: 137 mmol/L (ref 135–145)
Total Bilirubin: 0.7 mg/dL (ref 0.3–1.2)
Total Protein: 7.8 g/dL (ref 6.5–8.1)

## 2021-11-21 LAB — HEMOGLOBIN A1C
Hgb A1c MFr Bld: 7.3 % — ABNORMAL HIGH (ref 4.8–5.6)
Mean Plasma Glucose: 162.81 mg/dL

## 2021-11-21 LAB — LIPID PANEL
Cholesterol: 131 mg/dL (ref 0–200)
HDL: 28 mg/dL — ABNORMAL LOW (ref >40)
LDL Cholesterol: 62 mg/dL (ref 0–99)
Total CHOL/HDL Ratio: 4.7 RATIO
Triglycerides: 206 mg/dL — ABNORMAL HIGH (ref <150)
VLDL: 41 mg/dL — ABNORMAL HIGH (ref 0–40)

## 2021-11-21 LAB — TSH: TSH: 2.204 u[IU]/mL (ref 0.350–4.500)

## 2021-11-21 LAB — T4, FREE: Free T4: 0.96 ng/dL (ref 0.61–1.12)

## 2021-11-21 MED ORDER — LEVOTHYROXINE SODIUM 100 MCG PO TABS
ORAL_TABLET | Freq: Every day | ORAL | 1 refills | Status: DC
  Filled 2021-11-21 – 2021-12-17 (×2): qty 30, 30d supply, fill #0
  Filled 2022-01-19: qty 30, 30d supply, fill #1
  Filled 2022-02-12: qty 30, 30d supply, fill #2
  Filled 2022-03-18 – 2022-03-20 (×2): qty 30, 30d supply, fill #3
  Filled 2022-04-16: qty 30, 30d supply, fill #4

## 2021-11-22 ENCOUNTER — Other Ambulatory Visit: Payer: Self-pay

## 2021-11-22 LAB — MICROALBUMIN / CREATININE URINE RATIO
Creatinine, Urine: 81.5 mg/dL
Microalb Creat Ratio: 22 mg/g creat (ref 0–29)
Microalb, Ur: 17.6 ug/mL — ABNORMAL HIGH

## 2021-11-22 NOTE — Telephone Encounter (Signed)
-----   Message from Jearld Fenton, NP sent at 11/21/2021  3:45 PM EDT ----- ?A1c is 7.3%.  I think we can get better control if he would increase his metformin to 1000 mg 2 times daily.  Please let me know if he is agreeable and I will send this in.  Kidney and liver function is normal.  Potassium is borderline low.  Have him increase potassium containing foods such as green leafy vegetables and bananas.  Cholesterol remains elevated.  Would he be agreeable to increasing his atorvastatin to 80 mg daily?  Please let me know if he is and I will send this in.  He has a persistent anemia but not concerning at this time.  This is something that we will monitor.  Thyroid function is normal. ?

## 2021-11-22 NOTE — Telephone Encounter (Signed)
Pt advised.  He agreed to increase atorvastatin and metformin.  Please send to medication management.   ? ?Thanks,  ? ?-Mickel Baas  ?

## 2021-11-23 ENCOUNTER — Other Ambulatory Visit: Payer: Self-pay

## 2021-11-23 MED ORDER — ATORVASTATIN CALCIUM 80 MG PO TABS
80.0000 mg | ORAL_TABLET | Freq: Every day | ORAL | 0 refills | Status: DC
  Filled 2021-11-23: qty 30, 30d supply, fill #0
  Filled 2021-11-23: qty 90, 30d supply, fill #0
  Filled 2021-12-24: qty 30, 30d supply, fill #0
  Filled 2022-01-21: qty 30, 30d supply, fill #1

## 2021-11-23 MED ORDER — METFORMIN HCL 1000 MG PO TABS
1000.0000 mg | ORAL_TABLET | Freq: Two times a day (BID) | ORAL | 0 refills | Status: DC
  Filled 2021-11-23: qty 180, 90d supply, fill #0

## 2021-11-24 ENCOUNTER — Other Ambulatory Visit: Payer: Self-pay

## 2021-12-03 ENCOUNTER — Other Ambulatory Visit: Payer: Self-pay | Admitting: Internal Medicine

## 2021-12-03 ENCOUNTER — Encounter: Payer: Self-pay | Admitting: Internal Medicine

## 2021-12-03 DIAGNOSIS — I10 Essential (primary) hypertension: Secondary | ICD-10-CM

## 2021-12-03 DIAGNOSIS — E1165 Type 2 diabetes mellitus with hyperglycemia: Secondary | ICD-10-CM

## 2021-12-04 NOTE — Telephone Encounter (Signed)
Is it okay to send a glucose meter to his pharmacy?  Thanks,   -Mickel Baas

## 2021-12-05 ENCOUNTER — Other Ambulatory Visit: Payer: Self-pay

## 2021-12-05 MED ORDER — BLOOD GLUCOSE METER KIT
PACK | 0 refills | Status: AC
  Filled 2021-12-05: qty 1, 1d supply, fill #0

## 2021-12-05 MED ORDER — RIGHTEST GL300 LANCETS MISC
0 refills | Status: DC
  Filled 2021-12-05: qty 100, 100d supply, fill #0

## 2021-12-05 MED ORDER — CARVEDILOL 25 MG PO TABS
ORAL_TABLET | ORAL | 0 refills | Status: DC
  Filled 2021-12-05: qty 180, 90d supply, fill #0
  Filled 2021-12-05: qty 180, fill #0

## 2021-12-05 MED ORDER — RIGHTEST GS550 BLOOD GLUCOSE VI STRP
ORAL_STRIP | 0 refills | Status: DC
  Filled 2021-12-05: qty 100, 100d supply, fill #0

## 2021-12-05 NOTE — Telephone Encounter (Signed)
Requested Prescriptions  Pending Prescriptions Disp Refills  . carvedilol (COREG) 25 MG tablet 180 tablet 0    Sig: TAKE 1 TABLET BY MOUTH TWICE A DAY WITH A MEAL.     Cardiovascular: Beta Blockers 3 Passed - 12/03/2021  8:55 PM      Passed - Cr in normal range and within 360 days    Creat  Date Value Ref Range Status  03/13/2018 0.91 0.60 - 1.35 mg/dL Final   Creatinine, Ser  Date Value Ref Range Status  11/21/2021 1.10 0.61 - 1.24 mg/dL Final         Passed - AST in normal range and within 360 days    AST  Date Value Ref Range Status  11/21/2021 28 15 - 41 U/L Final   SGOT(AST)  Date Value Ref Range Status  09/22/2012 28 15 - 37 Unit/L Final         Passed - ALT in normal range and within 360 days    ALT  Date Value Ref Range Status  11/21/2021 31 0 - 44 U/L Final   SGPT (ALT)  Date Value Ref Range Status  09/22/2012 31 12 - 78 U/L Final         Passed - Last BP in normal range    BP Readings from Last 1 Encounters:  11/10/21 116/78         Passed - Last Heart Rate in normal range    Pulse Readings from Last 1 Encounters:  11/10/21 73         Passed - Valid encounter within last 6 months    Recent Outpatient Visits          3 weeks ago Type 2 diabetes mellitus with hyperglycemia, without long-term current use of insulin ([place])   [facility] [place], Coralie Keens, NP   5 months ago Type 2 diabetes mellitus with hyperglycemia, without long-term current use of insulin [facility])   [facility] [place], Coralie Keens, NP   11 months ago Type 2 diabetes mellitus with hyperglycemia, without long-term current use of insulin [facility])   [facility] [place], Coralie Keens, NP   1 year ago Primary hypertension   [facility] Kathrine Haddock, NP   1 year ago Paresthesia of both hands   [facility], Lupita Raider, [place]

## 2021-12-07 ENCOUNTER — Other Ambulatory Visit: Payer: Self-pay

## 2021-12-08 ENCOUNTER — Other Ambulatory Visit: Payer: Self-pay

## 2021-12-10 ENCOUNTER — Other Ambulatory Visit: Payer: Self-pay | Admitting: Internal Medicine

## 2021-12-12 ENCOUNTER — Other Ambulatory Visit: Payer: Self-pay | Admitting: Internal Medicine

## 2021-12-12 ENCOUNTER — Other Ambulatory Visit: Payer: Self-pay

## 2021-12-17 ENCOUNTER — Other Ambulatory Visit: Payer: Self-pay | Admitting: Internal Medicine

## 2021-12-17 DIAGNOSIS — I10 Essential (primary) hypertension: Secondary | ICD-10-CM

## 2021-12-18 ENCOUNTER — Other Ambulatory Visit: Payer: Self-pay

## 2021-12-18 ENCOUNTER — Other Ambulatory Visit: Payer: Self-pay | Admitting: Internal Medicine

## 2021-12-18 DIAGNOSIS — I10 Essential (primary) hypertension: Secondary | ICD-10-CM

## 2021-12-19 ENCOUNTER — Other Ambulatory Visit: Payer: Self-pay

## 2021-12-19 MED FILL — Olmesartan Medoxomil Tab 40 MG: ORAL | 90 days supply | Qty: 90 | Fill #0 | Status: AC

## 2021-12-19 NOTE — Telephone Encounter (Signed)
Requested Prescriptions  Pending Prescriptions Disp Refills  . olmesartan (BENICAR) 40 MG tablet 90 tablet 0    Sig: TAKE ONE TABLET BY MOUTH ONCE EVERY DAY.     Cardiovascular:  Angiotensin Receptor Blockers Failed - 12/18/2021  5:59 PM      Failed - K in normal range and within 180 days    Potassium  Date Value Ref Range Status  11/21/2021 3.4 (L) 3.5 - 5.1 mmol/L Final  09/22/2012 3.9 3.5 - 5.1 mmol/L Final         Passed - Cr in normal range and within 180 days    Creat  Date Value Ref Range Status  03/13/2018 0.91 0.60 - 1.35 mg/dL Final   Creatinine, Ser  Date Value Ref Range Status  11/21/2021 1.10 0.61 - 1.24 mg/dL Final         Passed - Patient is not pregnant      Passed - Last BP in normal range    BP Readings from Last 1 Encounters:  11/10/21 116/78         Passed - Valid encounter within last 6 months    Recent Outpatient Visits          1 month ago Type 2 diabetes mellitus with hyperglycemia, without long-term current use of insulin [facility])   [facility] [place], Coralie Keens, NP   6 months ago Type 2 diabetes mellitus with hyperglycemia, without long-term current use of insulin [facility])   [facility] [place], [place] W, NP   1 year ago Type 2 diabetes mellitus with hyperglycemia, without long-term current use of insulin [facility])   [facility] [place], Coralie Keens, NP   1 year ago Primary hypertension   [facility] Kathrine Haddock, NP   1 year ago Paresthesia of both hands   [facility], Lupita Raider, 

## 2021-12-24 ENCOUNTER — Other Ambulatory Visit: Payer: Self-pay | Admitting: Internal Medicine

## 2021-12-25 ENCOUNTER — Other Ambulatory Visit: Payer: Self-pay

## 2021-12-26 ENCOUNTER — Other Ambulatory Visit: Payer: Self-pay

## 2022-01-04 ENCOUNTER — Other Ambulatory Visit: Payer: Self-pay | Admitting: Internal Medicine

## 2022-01-04 DIAGNOSIS — I10 Essential (primary) hypertension: Secondary | ICD-10-CM

## 2022-01-05 ENCOUNTER — Other Ambulatory Visit: Payer: Self-pay | Admitting: Internal Medicine

## 2022-01-05 ENCOUNTER — Other Ambulatory Visit: Payer: Self-pay

## 2022-01-05 MED ORDER — MONTELUKAST SODIUM 10 MG PO TABS
10.0000 mg | ORAL_TABLET | Freq: Every day | ORAL | 0 refills | Status: DC
  Filled 2022-01-05: qty 90, 90d supply, fill #0

## 2022-01-05 MED ORDER — POTASSIUM CHLORIDE CRYS ER 20 MEQ PO TBCR
EXTENDED_RELEASE_TABLET | ORAL | 0 refills | Status: DC
  Filled 2022-01-05: qty 90, 90d supply, fill #0

## 2022-01-19 ENCOUNTER — Other Ambulatory Visit: Payer: Self-pay

## 2022-01-21 ENCOUNTER — Other Ambulatory Visit: Payer: Self-pay | Admitting: Internal Medicine

## 2022-01-21 DIAGNOSIS — I10 Essential (primary) hypertension: Secondary | ICD-10-CM

## 2022-01-22 ENCOUNTER — Other Ambulatory Visit: Payer: Self-pay

## 2022-01-23 ENCOUNTER — Other Ambulatory Visit: Payer: Self-pay

## 2022-01-23 ENCOUNTER — Other Ambulatory Visit: Payer: Self-pay | Admitting: Internal Medicine

## 2022-01-23 DIAGNOSIS — I10 Essential (primary) hypertension: Secondary | ICD-10-CM

## 2022-01-24 ENCOUNTER — Other Ambulatory Visit: Payer: Self-pay

## 2022-01-24 MED FILL — Amlodipine Besylate Tab 10 MG (Base Equivalent): ORAL | 90 days supply | Qty: 90 | Fill #0 | Status: AC

## 2022-01-24 NOTE — Telephone Encounter (Signed)
Requested Prescriptions  Pending Prescriptions Disp Refills  . amLODipine (NORVASC) 10 MG tablet 90 tablet 0    Sig: TAKE ONE TABLET BY MOUTH ONCE EVERY DAY.     Cardiovascular: Calcium Channel Blockers 2 Passed - 01/23/2022  7:46 AM      Passed - Last BP in normal range    BP Readings from Last 1 Encounters:  11/10/21 116/78         Passed - Last Heart Rate in normal range    Pulse Readings from Last 1 Encounters:  11/10/21 73         Passed - Valid encounter within last 6 months    Recent Outpatient Visits          2 months ago Type 2 diabetes mellitus with hyperglycemia, without long-term current use of insulin ([place])   [facility] [place], Coralie Keens, NP   7 months ago Type 2 diabetes mellitus with hyperglycemia, without long-term current use of insulin [facility]   [facility] [place], Coralie Keens, NP   1 year ago Type 2 diabetes mellitus with hyperglycemia, without long-term current use of insulin [facility])   [facility] [place], Coralie Keens, NP   1 year ago Primary hypertension   [facility] Kathrine Haddock, NP   1 year ago Paresthesia of both hands   [facility], Lupita Raider, [place]

## 2022-02-12 ENCOUNTER — Other Ambulatory Visit: Payer: Self-pay | Admitting: Internal Medicine

## 2022-02-12 ENCOUNTER — Other Ambulatory Visit: Payer: Self-pay

## 2022-02-12 DIAGNOSIS — I10 Essential (primary) hypertension: Secondary | ICD-10-CM

## 2022-02-13 ENCOUNTER — Other Ambulatory Visit: Payer: Self-pay

## 2022-02-14 ENCOUNTER — Other Ambulatory Visit: Payer: Self-pay

## 2022-02-14 MED FILL — Chlorthalidone Tab 25 MG: ORAL | 90 days supply | Qty: 45 | Fill #0 | Status: AC

## 2022-02-14 NOTE — Telephone Encounter (Signed)
Requested Prescriptions  Pending Prescriptions Disp Refills  . chlorthalidone (HYGROTON) 25 MG tablet 45 tablet 0    Sig: TAKE 1/2 TABLET (12.5 MG) BY MOUTH ONCE EVERY DAY.     Cardiovascular: Diuretics - Thiazide Failed - 02/12/2022  4:47 PM      Failed - K in normal range and within 180 days    Potassium  Date Value Ref Range Status  11/21/2021 3.4 (L) 3.5 - 5.1 mmol/L Final  09/22/2012 3.9 3.5 - 5.1 mmol/L Final         Passed - Cr in normal range and within 180 days    Creat  Date Value Ref Range Status  03/13/2018 0.91 0.60 - 1.35 mg/dL Final   Creatinine, Ser  Date Value Ref Range Status  11/21/2021 1.10 0.61 - 1.24 mg/dL Final         Passed - Na in normal range and within 180 days    Sodium  Date Value Ref Range Status  11/21/2021 137 135 - 145 mmol/L Final  01/24/2017 143 134 - 144 mmol/L Final  09/22/2012 139 136 - 145 mmol/L Final         Passed - Last BP in normal range    BP Readings from Last 1 Encounters:  11/10/21 116/78         Passed - Valid encounter within last 6 months    Recent Outpatient Visits          3 months ago Type 2 diabetes mellitus with hyperglycemia, without long-term current use of insulin ([place])   [facility] [place], Coralie Keens, NP   7 months ago Type 2 diabetes mellitus with hyperglycemia, without long-term current use of insulin [facility])   [facility] [place], Coralie Keens, NP   1 year ago Type 2 diabetes mellitus with hyperglycemia, without long-term current use of insulin [facility])   [facility] [place], Coralie Keens, NP   1 year ago Primary hypertension   [facility] Kathrine Haddock, NP   1 year ago Paresthesia of both hands   [facility], Lupita Raider, [place]

## 2022-02-15 ENCOUNTER — Other Ambulatory Visit: Payer: Self-pay

## 2022-02-20 ENCOUNTER — Other Ambulatory Visit: Payer: Self-pay | Admitting: Internal Medicine

## 2022-02-20 DIAGNOSIS — I10 Essential (primary) hypertension: Secondary | ICD-10-CM

## 2022-02-21 ENCOUNTER — Other Ambulatory Visit: Payer: Self-pay

## 2022-02-21 MED ORDER — METFORMIN HCL 1000 MG PO TABS
1000.0000 mg | ORAL_TABLET | Freq: Two times a day (BID) | ORAL | 0 refills | Status: DC
Start: 2022-02-21 — End: 2022-04-30
  Filled 2022-02-21: qty 180, 90d supply, fill #0

## 2022-02-21 MED ORDER — ATORVASTATIN CALCIUM 80 MG PO TABS
80.0000 mg | ORAL_TABLET | Freq: Every day | ORAL | 0 refills | Status: DC
  Filled 2022-02-21: qty 90, 90d supply, fill #0

## 2022-02-21 MED ORDER — AMLODIPINE BESYLATE 10 MG PO TABS
ORAL_TABLET | Freq: Every day | ORAL | 0 refills | Status: DC
  Filled 2022-02-21: qty 90, fill #0
  Filled 2022-04-23: qty 90, 90d supply, fill #0

## 2022-02-21 NOTE — Telephone Encounter (Signed)
Requested medication (s) are due for refill today: yes  Requested medication (s) are on the active medication list: yes  Last refill:  do not see that refill ever made it to the pharmacy- please check  Future visit scheduled: no  Notes to clinic:  please review   Requested Prescriptions  Pending Prescriptions Disp Refills   chlorthalidone (HYGROTON) 25 MG tablet 45 tablet     Sig: TAKE 1/2 TABLET (12.5 MG) BY MOUTH ONCE EVERY DAY.     Cardiovascular: Diuretics - Thiazide Failed - 02/20/2022  7:35 PM      Failed - K in normal range and within 180 days    Potassium  Date Value Ref Range Status  11/21/2021 3.4 (L) 3.5 - 5.1 mmol/L Final  09/22/2012 3.9 3.5 - 5.1 mmol/L Final         Passed - Cr in normal range and within 180 days    Creat  Date Value Ref Range Status  03/13/2018 0.91 0.60 - 1.35 mg/dL Final   Creatinine, Ser  Date Value Ref Range Status  11/21/2021 1.10 0.61 - 1.24 mg/dL Final         Passed - Na in normal range and within 180 days    Sodium  Date Value Ref Range Status  11/21/2021 137 135 - 145 mmol/L Final  01/24/2017 143 134 - 144 mmol/L Final  09/22/2012 139 136 - 145 mmol/L Final         Passed - Last BP in normal range    BP Readings from Last 1 Encounters:  11/10/21 116/78         Passed - Valid encounter within last 6 months    Recent Outpatient Visits           3 months ago Type 2 diabetes mellitus with hyperglycemia, without long-term current use of insulin ([place])   [facility] [place], Coralie Keens, NP   8 months ago Type 2 diabetes mellitus with hyperglycemia, without long-term current use of insulin ([place])   [facility] [place], Coralie Keens, NP   1 year ago Type 2 diabetes mellitus with hyperglycemia, without long-term current use of insulin ([place])   [place], Coralie Keens, NP   1 year ago Primary hypertension   [facility] Kathrine Haddock, NP   1 year ago Paresthesia of both  hands   [facility] [place], Lupita Raider, [place]              Signed Prescriptions Disp Refills   metFORMIN (GLUCOPHAGE) 1000 MG tablet 180 tablet 0    Sig: Take 1 tablet (1,000 mg total) by mouth 2 (two) times daily with a meal.     Endocrinology:  Diabetes - Biguanides Failed - 02/20/2022  7:35 PM      Failed - B12 Level in normal range and within 720 days    No results found for: "VITAMINB12"       Failed - CBC within normal limits and completed in the last 12 months    WBC  Date Value Ref Range Status  11/21/2021 7.1 4.0 - 10.5 K/uL Final   RBC  Date Value Ref Range Status  11/21/2021 4.81 4.22 - 5.81 MIL/uL Final   Hemoglobin  Date Value Ref Range Status  11/21/2021 11.8 (L) 13.0 - 17.0 g/dL Final   HGB  Date Value Ref Range Status  09/22/2012 12.5 (L) 13.0 - 18.0 g/dL Final   HCT  Date Value Ref Range Status  11/21/2021 37.5 (L) 39.0 - 52.0 % Final  09/22/2012 38.1 (L) 40.0 - 52.0 % Final   MCHC  Date Value Ref Range Status  11/21/2021 31.5 30.0 - 36.0 g/dL Final   [facility]  Date Value Ref Range Status  11/21/2021 24.5 (L) 26.0 - 34.0 pg Final   MCV  Date Value Ref Range Status  11/21/2021 78.0 (L) 80.0 - 100.0 fL Final  09/22/2012 81 80 - 100 fL Final   No results found for: "PLTCOUNTKUC", "LABPLAT", "POCPLA" RDW  Date Value Ref Range Status  11/21/2021 14.8 11.5 - 15.5 % Final  09/22/2012 15.0 (H) 11.5 - 14.5 % Final         Passed - Cr in normal range and within 360 days    Creat  Date Value Ref Range Status  03/13/2018 0.91 0.60 - 1.35 mg/dL Final   Creatinine, Ser  Date Value Ref Range Status  11/21/2021 1.10 0.61 - 1.24 mg/dL Final         Passed - HBA1C is between 0 and 7.9 and within 180 days    Hgb A1c MFr Bld  Date Value Ref Range Status  11/21/2021 7.3 (H) 4.8 - 5.6 % Final    Comment:    (NOTE) Pre diabetes:          5.7%-6.4%  Diabetes:              >6.4%  Glycemic control for   <7.0% adults with diabetes           Passed - eGFR in normal range and within 360 days    GFR, Est African American  Date Value Ref Range Status  03/13/2018 116 > OR = 60 mL/min/1.74m Final   GFR calc Af Amer  Date Value Ref Range Status  04/15/2020 >60 >60 mL/min Final   GFR, Est Non African American  Date Value Ref Range Status  03/13/2018 100 > OR = 60 mL/min/1.736mFinal   GFR, Estimated  Date Value Ref Range Status  11/21/2021 >60 >60 mL/min Final    Comment:    (NOTE) Calculated using the CKD-EPI Creatinine Equation (2021)          Passed - Valid encounter within last 6 months    Recent Outpatient Visits           3 months ago Type 2 diabetes mellitus with hyperglycemia, without long-term current use of insulin ([place]  [facility]   8 months ago Type 2 diabetes mellitus with hyperglycemia, without long-term current use of insulin ([facility]  [facility] Keens[facility]   1 year ago Type 2 diabetes mellitus with hyperglycemia, without long-term current use of insulin ([facility]  [facility]   1 year ago Primary hypertension   [facility]   1 year ago Paresthesia of both hands   [facility]               atorvastatin (LIPITOR) 80 MG tablet 90 tablet 0    Sig: Take 1 tablet (80 mg total) by mouth once daily.     Cardiovascular:  Antilipid - Statins Failed - 02/20/2022  7:35 PM      Failed - Lipid Panel in normal range within the last 12 months    Cholesterol  Date Value Ref Range Status  11/21/2021 131 0 - 200 mg/dL Final   LDL Cholesterol (Calc)  Date Value Ref Range Status  03/13/2018 65 mg/dL (calc) Final    Comment:    Reference range: <100 . Desirable range <100 mg/dL for primary prevention;   <70 mg/dL for patients with CHD or diabetic patients  with > or = 2 CHD risk factors. Marland Kitchen LDL-C is now calculated using the  Martin-Hopkins  calculation, which is a validated novel method providing  better accuracy than the Friedewald equation in the  estimation of LDL-C.  Cresenciano Genre et al. Annamaria Helling. 6283;151(76): 2061-2068  (http://education.[facility].com/faq/FAQ164)    LDL Cholesterol  Date Value Ref Range Status  11/21/2021 62 0 - 99 mg/dL Final    Comment:           Total Cholesterol/HDL:CHD Risk Coronary Heart Disease Risk Table                     Men   Women  1/2 Average Risk   3.4   3.3  Average Risk       5.0   4.4  2 X Average Risk   9.6   7.1  3 X Average Risk  23.4   11.0        Use the calculated Patient Ratio above and the CHD Risk Table to determine the patient's CHD Risk.        ATP III CLASSIFICATION (LDL):  <100     mg/dL   Optimal  100-129  mg/dL   Near or Above                    Optimal  130-159  mg/dL   Borderline  160-189  mg/dL   High  >190     mg/dL   Very High Performed at [facility] [place], [place] [address]    HDL  Date Value Ref Range Status  11/21/2021 28 (L) >40 mg/dL Final   Triglycerides  Date Value Ref Range Status  11/21/2021 206 (H) <150 mg/dL Final         Passed - Patient is not pregnant      Passed - Valid encounter within last 12 months    Recent Outpatient Visits           3 months ago Type 2 diabetes mellitus with hyperglycemia, without long-term current use of insulin ([place])   [facility] [place], [place] W, NP   8 months ago Type 2 diabetes mellitus with hyperglycemia, without long-term current use of insulin [facility])   [facility] [place], [place] W, NP   1 year ago Type 2 diabetes mellitus with hyperglycemia, without long-term current use of insulin [facility])   [facility], Coralie Keens, NP   1 year ago Primary hypertension   [place], NP   1 year ago Paresthesia of both hands   [place] M, FNP                amLODipine (NORVASC) 10 MG tablet 90 tablet 0    Sig: TAKE ONE TABLET BY MOUTH ONCE EVERY DAY.     Cardiovascular: Calcium Channel Blockers 2 Passed - 02/20/2022  7:35 PM      Passed - Last BP in normal range    BP Readings from Last 1 Encounters:  11/10/21 116/78         Passed -  Last Heart Rate in normal range    Pulse Readings from Last 1 Encounters:  11/10/21 73         Passed - Valid encounter within last 6 months    Recent Outpatient Visits           3 months ago Type 2 diabetes mellitus with hyperglycemia, without long-term current use of insulin [facility])   [facility] [place], Coralie Keens, NP   8 months ago Type 2 diabetes mellitus with hyperglycemia, without long-term current use of insulin [facility])   [facility] [place], Coralie Keens, NP   1 year ago Type 2 diabetes mellitus with hyperglycemia, without long-term current use of insulin [facility])   [facility] [place], Coralie Keens, NP   1 year ago Primary hypertension   [facility] Kathrine Haddock, NP   1 year ago Paresthesia of both hands   [facility], Lupita Raider, [place]

## 2022-02-21 NOTE — Telephone Encounter (Signed)
Requested Prescriptions  Pending Prescriptions Disp Refills  . metFORMIN (GLUCOPHAGE) 1000 MG tablet 180 tablet 0    Sig: Take 1 tablet (1,000 mg total) by mouth 2 (two) times daily with a meal.     Endocrinology:  Diabetes - Biguanides Failed - 02/20/2022  7:35 PM      Failed - B12 Level in normal range and within 720 days    No results found for: "VITAMINB12"       Failed - CBC within normal limits and completed in the last 12 months    WBC  Date Value Ref Range Status  11/21/2021 7.1 4.0 - 10.5 K/uL Final   RBC  Date Value Ref Range Status  11/21/2021 4.81 4.22 - 5.81 MIL/uL Final   Hemoglobin  Date Value Ref Range Status  11/21/2021 11.8 (L) 13.0 - 17.0 g/dL Final   HGB  Date Value Ref Range Status  09/22/2012 12.5 (L) 13.0 - 18.0 g/dL Final   HCT  Date Value Ref Range Status  11/21/2021 37.5 (L) 39.0 - 52.0 % Final  09/22/2012 38.1 (L) 40.0 - 52.0 % Final   MCHC  Date Value Ref Range Status  11/21/2021 31.5 30.0 - 36.0 g/dL Final   [facility]  Date Value Ref Range Status  11/21/2021 24.5 (L) 26.0 - 34.0 pg Final   MCV  Date Value Ref Range Status  11/21/2021 78.0 (L) 80.0 - 100.0 fL Final  09/22/2012 81 80 - 100 fL Final   No results found for: "PLTCOUNTKUC", "LABPLAT", "POCPLA" RDW  Date Value Ref Range Status  11/21/2021 14.8 11.5 - 15.5 % Final  09/22/2012 15.0 (H) 11.5 - 14.5 % Final         Passed - Cr in normal range and within 360 days    Creat  Date Value Ref Range Status  03/13/2018 0.91 0.60 - 1.35 mg/dL Final   Creatinine, Ser  Date Value Ref Range Status  11/21/2021 1.10 0.61 - 1.24 mg/dL Final         Passed - HBA1C is between 0 and 7.9 and within 180 days    Hgb A1c MFr Bld  Date Value Ref Range Status  11/21/2021 7.3 (H) 4.8 - 5.6 % Final    Comment:    (NOTE) Pre diabetes:          5.7%-6.4%  Diabetes:              >6.4%  Glycemic control for   <7.0% adults with diabetes          Passed - eGFR in normal range and within 360  days    GFR, Est African American  Date Value Ref Range Status  03/13/2018 116 > OR = 60 mL/min/1.51m Final   GFR calc Af Amer  Date Value Ref Range Status  04/15/2020 >60 >60 mL/min Final   GFR, Est Non African American  Date Value Ref Range Status  03/13/2018 100 > OR = 60 mL/min/1.720mFinal   GFR, Estimated  Date Value Ref Range Status  11/21/2021 >60 >60 mL/min Final    Comment:    (NOTE) Calculated using the CKD-EPI Creatinine Equation (2021)          Passed - Valid encounter within last 6 months    Recent Outpatient Visits          3 months ago Type 2 diabetes mellitus with hyperglycemia, [facility] long-term [facility]  [facility]oral[facility] Ke[facility]  8 months ago Type 2 diabetes mellitus with hyperglycemia, without long-term current use of insulin [facility])   [facility] [place], [place] W, NP   1 year ago Type 2 diabetes mellitus with hyperglycemia, without long-term current use of insulin [facility])   [facility] [place], Charles Keens, NP   1 year ago Primary hypertension   [facility] Charles Haddock, NP   1 year ago Paresthesia of both hands   [place], Charles Mathis, [place]             . atorvastatin (LIPITOR) 80 MG tablet 90 tablet 0    Sig: Take 1 tablet (80 mg total) by mouth once daily.     Cardiovascular:  Antilipid - Statins Failed - 02/20/2022  7:35 PM      Failed - Lipid Panel in normal range within the last 12 months    Cholesterol  Date Value Ref Range Status  11/21/2021 131 0 - 200 mg/dL Final   LDL Cholesterol (Calc)  Date Value Ref Range Status  03/13/2018 65 mg/dL (calc) Final    Comment:    Reference range: <100 . Desirable range <100 mg/dL for primary prevention;   <70 mg/dL for patients with CHD or diabetic patients  with > or = 2 CHD risk factors. Marland Kitchen LDL-C is now calculated using the Martin-Hopkins  calculation, which is a validated novel method  providing  better accuracy than the Friedewald equation in the  estimation of LDL-C.  Cresenciano Genre et al. Annamaria Helling. 3220;254(27): 2061-2068  (http://education.[employer].com/faq/FAQ164)    LDL Cholesterol  Date Value Ref Range Status  11/21/2021 62 0 - 99 mg/dL Final    Comment:           Total Cholesterol/HDL:CHD Risk Coronary Heart Disease Risk Table                     Men   Women  1/2 Average Risk   3.4   3.3  Average Risk       5.0   4.4  2 X Average Risk   9.6   7.1  3 X Average Risk  23.4   11.0        Use the calculated Patient Ratio above and the CHD Risk Table to determine the patient's CHD Risk.        ATP III CLASSIFICATION (LDL):  <100     mg/dL   Optimal  100-129  mg/dL   Near or Above                    Optimal  130-159  mg/dL   Borderline  160-189  mg/dL   High  >190     mg/dL   Very High Performed at [facility], [place], [place] [address]    HDL  Date Value Ref Range Status  11/21/2021 28 (L) >40 mg/dL Final   Triglycerides  Date Value Ref Range Status  11/21/2021 206 (H) <150 mg/dL Final         Passed - Patient is not pregnant      Passed - Valid encounter within last 12 months    Recent Outpatient Visits          3 months ago Type 2 diabetes mellitus with hyperglycemia, without long-term current use of insulin [facility])   [facility] [place], Charles Keens, NP   8 months ago Type 2 diabetes mellitus  with hyperglycemia, without long-term current use of insulin [facility])   [facility] [place], [place] W, NP   1 year ago Type 2 diabetes mellitus with hyperglycemia, without long-term current use of insulin [facility])   [facility] [place], Charles Keens, NP   1 year ago Primary hypertension   [place], NP   1 year ago Paresthesia of both hands   [place] M, [place]             . amLODipine (NORVASC) 10 MG tablet 90 tablet 0     Sig: TAKE ONE TABLET BY MOUTH ONCE EVERY DAY.     Cardiovascular: Calcium Channel Blockers 2 Passed - 02/20/2022  7:35 PM      Passed - Last BP in normal range    BP Readings from Last 1 Encounters:  11/10/21 116/78         Passed - Last Heart Rate in normal range    Pulse Readings from Last 1 Encounters:  11/10/21 73         Passed - Valid encounter within last 6 months    Recent Outpatient Visits          3 months ago Type 2 diabetes mellitus with hyperglycemia, without long-term current use of insulin ([place])   [facility] [place], Charles Keens, NP   8 months ago Type 2 diabetes mellitus with hyperglycemia, without long-term current use of insulin [facility])   [facility] [place], Charles Keens, NP   1 year ago Type 2 diabetes mellitus with hyperglycemia, without long-term current use of insulin [facility])   [facility] [place], Charles Keens, NP   1 year ago Primary hypertension   [facility] Charles Haddock, NP   1 year ago Paresthesia of both hands   [place], Charles Mathis, [place]             . chlorthalidone (HYGROTON) 25 MG tablet 45 tablet     Sig: TAKE 1/2 TABLET (12.5 MG) BY MOUTH ONCE EVERY DAY.     Cardiovascular: Diuretics - Thiazide Failed - 02/20/2022  7:35 PM      Failed - K in normal range and within 180 days    Potassium  Date Value Ref Range Status  11/21/2021 3.4 (L) 3.5 - 5.1 mmol/L Final  09/22/2012 3.9 3.5 - 5.1 mmol/L Final         Passed - Cr in normal range and within 180 days    Creat  Date Value Ref Range Status  03/13/2018 0.91 0.60 - 1.35 mg/dL Final   Creatinine, Ser  Date Value Ref Range Status  11/21/2021 1.10 0.61 - 1.24 mg/dL Final         Passed - Na in normal range and within 180 days    Sodium  Date Value Ref Range Status  11/21/2021 137 135 - 145 mmol/L Final  01/24/2017 143 134 - 144 mmol/L Final  09/22/2012 139 136 - 145 mmol/L Final         Passed - Last BP in normal  range    BP Readings from Last 1 Encounters:  11/10/21 116/78         Passed - Valid encounter within last 6 months    Recent Outpatient Visits          3 months ago Type 2 diabetes mellitus with hyperglycemia, without long-term current use  of insulin [facility])   [facility] [place], [place] W, NP   8 months ago Type 2 diabetes mellitus with hyperglycemia, without long-term current use of insulin [facility])   [facility] [place], [place], NP   1 year ago Type 2 diabetes mellitus with hyperglycemia, without long-term current use of insulin [facility])   [facility] [place], Charles Keens, NP   1 year ago Primary hypertension   [facility] Charles Haddock, NP   1 year ago Paresthesia of both hands   [facility], Charles Mathis, [place]

## 2022-03-01 ENCOUNTER — Other Ambulatory Visit: Payer: Self-pay | Admitting: Internal Medicine

## 2022-03-01 DIAGNOSIS — I10 Essential (primary) hypertension: Secondary | ICD-10-CM

## 2022-03-02 ENCOUNTER — Other Ambulatory Visit: Payer: Self-pay

## 2022-03-02 MED ORDER — CARVEDILOL 25 MG PO TABS
ORAL_TABLET | ORAL | 0 refills | Status: DC
  Filled 2022-03-02: qty 180, 90d supply, fill #0

## 2022-03-02 NOTE — Telephone Encounter (Signed)
Requested Prescriptions  Pending Prescriptions Disp Refills  . carvedilol (COREG) 25 MG tablet 180 tablet 0    Sig: TAKE 1 TABLET BY MOUTH TWICE A DAY WITH A MEAL.     Cardiovascular: Beta Blockers 3 Passed - 03/01/2022 10:03 PM      Passed - Cr in normal range and within 360 days    Creat  Date Value Ref Range Status  03/13/2018 0.91 0.60 - 1.35 mg/dL Final   Creatinine, Ser  Date Value Ref Range Status  11/21/2021 1.10 0.61 - 1.24 mg/dL Final         Passed - AST in normal range and within 360 days    AST  Date Value Ref Range Status  11/21/2021 28 15 - 41 U/L Final   SGOT(AST)  Date Value Ref Range Status  09/22/2012 28 15 - 37 Unit/L Final         Passed - ALT in normal range and within 360 days    ALT  Date Value Ref Range Status  11/21/2021 31 0 - 44 U/L Final   SGPT (ALT)  Date Value Ref Range Status  09/22/2012 31 12 - 78 U/L Final         Passed - Last BP in normal range    BP Readings from Last 1 Encounters:  11/10/21 116/78         Passed - Last Heart Rate in normal range    Pulse Readings from Last 1 Encounters:  11/10/21 73         Passed - Valid encounter within last 6 months    Recent Outpatient Visits          3 months ago Type 2 diabetes mellitus with hyperglycemia, without long-term current use of insulin ([place])   [facility] [place], Coralie Keens, NP   8 months ago Type 2 diabetes mellitus with hyperglycemia, without long-term current use of insulin [facility])   [facility] [place], [place] W, NP   1 year ago Type 2 diabetes mellitus with hyperglycemia, without long-term current use of insulin [facility])   [facility] [place], Coralie Keens, NP   1 year ago Primary hypertension   [facility] Kathrine Haddock, NP   1 year ago Paresthesia of both hands   [facility], Lupita Raider, [place]

## 2022-03-18 ENCOUNTER — Other Ambulatory Visit: Payer: Self-pay | Admitting: Internal Medicine

## 2022-03-18 DIAGNOSIS — I10 Essential (primary) hypertension: Secondary | ICD-10-CM

## 2022-03-19 ENCOUNTER — Other Ambulatory Visit: Payer: Self-pay

## 2022-03-20 ENCOUNTER — Other Ambulatory Visit: Payer: Self-pay | Admitting: Internal Medicine

## 2022-03-20 ENCOUNTER — Other Ambulatory Visit: Payer: Self-pay

## 2022-03-20 DIAGNOSIS — I10 Essential (primary) hypertension: Secondary | ICD-10-CM

## 2022-03-20 MED ORDER — FLUTICASONE PROPIONATE 50 MCG/ACT NA SUSP
2.0000 | Freq: Every day | NASAL | 0 refills | Status: DC
  Filled 2022-03-20: qty 16, 30d supply, fill #0

## 2022-03-22 ENCOUNTER — Other Ambulatory Visit: Payer: Self-pay

## 2022-03-22 MED FILL — Olmesartan Medoxomil Tab 40 MG: ORAL | 90 days supply | Qty: 90 | Fill #0 | Status: AC

## 2022-03-22 NOTE — Telephone Encounter (Signed)
Requested Prescriptions  Pending Prescriptions Disp Refills  . olmesartan (BENICAR) 40 MG tablet 90 tablet 0    Sig: TAKE ONE TABLET BY MOUTH ONCE EVERY DAY.     Cardiovascular:  Angiotensin Receptor Blockers Failed - 03/20/2022  5:32 PM      Failed - K in normal range and within 180 days    Potassium  Date Value Ref Range Status  11/21/2021 3.4 (L) 3.5 - 5.1 mmol/L Final  09/22/2012 3.9 3.5 - 5.1 mmol/L Final         Passed - Cr in normal range and within 180 days    Creat  Date Value Ref Range Status  03/13/2018 0.91 0.60 - 1.35 mg/dL Final   Creatinine, Ser  Date Value Ref Range Status  11/21/2021 1.10 0.61 - 1.24 mg/dL Final         Passed - Patient is not pregnant      Passed - Last BP in normal range    BP Readings from Last 1 Encounters:  11/10/21 116/78         Passed - Valid encounter within last 6 months    Recent Outpatient Visits          4 months ago Type 2 diabetes mellitus with hyperglycemia, without long-term current use of insulin [facility])   [facility], NP   9 months ago Type 2 diabetes mellitus with hyperglycemia, without long-term current use of insulin [facility])   [facility] [place], [place] W, NP   1 year ago Type 2 diabetes mellitus with hyperglycemia, without long-term current use of insulin [facility])   [facility] [place], Coralie Keens, NP   1 year ago Primary hypertension   [facility] Kathrine Haddock, NP   1 year ago Paresthesia of both hands   [facility], Lupita Raider, [place]

## 2022-04-01 ENCOUNTER — Other Ambulatory Visit: Payer: Self-pay | Admitting: Internal Medicine

## 2022-04-01 DIAGNOSIS — I10 Essential (primary) hypertension: Secondary | ICD-10-CM

## 2022-04-02 ENCOUNTER — Other Ambulatory Visit: Payer: Self-pay

## 2022-04-02 ENCOUNTER — Other Ambulatory Visit: Payer: Self-pay | Admitting: Internal Medicine

## 2022-04-02 DIAGNOSIS — I10 Essential (primary) hypertension: Secondary | ICD-10-CM

## 2022-04-03 ENCOUNTER — Other Ambulatory Visit: Payer: Self-pay

## 2022-04-03 MED FILL — Montelukast Sodium Tab 10 MG (Base Equiv): ORAL | 90 days supply | Qty: 90 | Fill #0 | Status: AC

## 2022-04-03 MED FILL — Potassium Chloride Microencapsulated Crys ER Tab 20 mEq: ORAL | 90 days supply | Qty: 90 | Fill #0 | Status: AC

## 2022-04-03 NOTE — Telephone Encounter (Signed)
Requested Prescriptions  Pending Prescriptions Disp Refills  . montelukast (SINGULAIR) 10 MG tablet 90 tablet 0    Sig: Take 1 tablet (10 mg total) by mouth once daily.     Pulmonology:  Leukotriene Inhibitors Passed - 04/02/2022  2:54 PM      Passed - Valid encounter within last 12 months    Recent Outpatient Visits          4 months ago Type 2 diabetes mellitus with hyperglycemia, without long-term current use of insulin [facility])   [facility] [place], Coralie Keens, NP   9 months ago Type 2 diabetes mellitus with hyperglycemia, without long-term current use of insulin [facility])   [facility] [place], Coralie Keens, NP   1 year ago Type 2 diabetes mellitus with hyperglycemia, without long-term current use of insulin [facility] [place])   [facility], Coralie Keens, NP   1 year ago Primary hypertension   [facility] Kathrine Haddock, NP   1 year ago Paresthesia of both hands   [place], Lupita Raider, [place]             . potassium chloride SA (KLOR-CON M) 20 MEQ tablet 90 tablet 0    Sig: TAKE 0.5 TABLET (10 MEQ TOTAL) BY MOUTH 2 TIMES A DAY     Endocrinology:  Minerals - Potassium Supplementation Failed - 04/02/2022  2:54 PM      Failed - K in normal range and within 360 days    Potassium  Date Value Ref Range Status  11/21/2021 3.4 (L) 3.5 - 5.1 mmol/L Final  09/22/2012 3.9 3.5 - 5.1 mmol/L Final         Passed - Cr in normal range and within 360 days    Creat  Date Value Ref Range Status  03/13/2018 0.91 0.60 - 1.35 mg/dL Final   Creatinine, Ser  Date Value Ref Range Status  11/21/2021 1.10 0.61 - 1.24 mg/dL Final         Passed - Valid encounter within last 12 months    Recent Outpatient Visits          4 months ago Type 2 diabetes mellitus with hyperglycemia, without long-term current use of insulin [facility])   [facility] [place], Coralie Keens, NP   9 months ago Type 2 diabetes mellitus with hyperglycemia,  without long-term current use of insulin [facility])   [facility] [place], [place] W, NP   1 year ago Type 2 diabetes mellitus with hyperglycemia, without long-term current use of insulin [facility])   [facility] [place], Coralie Keens, NP   1 year ago Primary hypertension   [facility] Kathrine Haddock, NP   1 year ago Paresthesia of both hands   [facility], Lupita Raider, [place]

## 2022-04-16 ENCOUNTER — Other Ambulatory Visit: Payer: Self-pay | Admitting: Internal Medicine

## 2022-04-17 ENCOUNTER — Other Ambulatory Visit: Payer: Self-pay

## 2022-04-17 MED ORDER — FLUTICASONE PROPIONATE 50 MCG/ACT NA SUSP
2.0000 | Freq: Every day | NASAL | 0 refills | Status: DC
  Filled 2022-04-17: qty 16, 30d supply, fill #0

## 2022-04-17 NOTE — Telephone Encounter (Signed)
Requested Prescriptions  Pending Prescriptions Disp Refills  . fluticasone (FLONASE) 50 MCG/ACT nasal spray 16 g 0    Sig: Place 2 sprays into both nostrils daily.     Ear, Nose, and Throat: Nasal Preparations - Corticosteroids Passed - 04/16/2022  5:53 PM      Passed - Valid encounter within last 12 months    Recent Outpatient Visits          5 months ago Type 2 diabetes mellitus with hyperglycemia, without long-term current use of insulin [facility])   [facility], Coralie Keens, NP   9 months ago Type 2 diabetes mellitus with hyperglycemia, without long-term current use of insulin [facility])   [facility] [place], Coralie Keens, NP   1 year ago Type 2 diabetes mellitus with hyperglycemia, without long-term current use of insulin [facility])   [facility] [place], Coralie Keens, NP   1 year ago Primary hypertension   [facility] Kathrine Haddock, NP   1 year ago Paresthesia of both hands   [facility], Lupita Raider, [place]

## 2022-04-24 ENCOUNTER — Other Ambulatory Visit: Payer: Self-pay

## 2022-04-29 ENCOUNTER — Other Ambulatory Visit: Payer: Self-pay | Admitting: Internal Medicine

## 2022-04-29 ENCOUNTER — Other Ambulatory Visit: Payer: Self-pay

## 2022-04-29 DIAGNOSIS — I10 Essential (primary) hypertension: Secondary | ICD-10-CM

## 2022-04-30 ENCOUNTER — Other Ambulatory Visit: Payer: Self-pay

## 2022-04-30 MED FILL — Metformin HCl Tab 1000 MG: ORAL | 30 days supply | Qty: 60 | Fill #0 | Status: AC

## 2022-04-30 MED FILL — Chlorthalidone Tab 25 MG: ORAL | 30 days supply | Qty: 15 | Fill #0 | Status: AC

## 2022-04-30 NOTE — Telephone Encounter (Signed)
Due 6 month f/u- courtesy RF given Requested Prescriptions  Pending Prescriptions Disp Refills  . chlorthalidone (HYGROTON) 25 MG tablet 45 tablet 0    Sig: TAKE 1/2 TABLET (12.5 MG) BY MOUTH ONCE EVERY DAY.     Cardiovascular: Diuretics - Thiazide Failed - 04/29/2022  8:30 PM      Failed - K in normal range and within 180 days    Potassium  Date Value Ref Range Status  11/21/2021 3.4 (L) 3.5 - 5.1 mmol/L Final  09/22/2012 3.9 3.5 - 5.1 mmol/L Final         Passed - Cr in normal range and within 180 days    Creat  Date Value Ref Range Status  03/13/2018 0.91 0.60 - 1.35 mg/dL Final   Creatinine, Ser  Date Value Ref Range Status  11/21/2021 1.10 0.61 - 1.24 mg/dL Final         Passed - Na in normal range and within 180 days    Sodium  Date Value Ref Range Status  11/21/2021 137 135 - 145 mmol/L Final  01/24/2017 143 134 - 144 mmol/L Final  09/22/2012 139 136 - 145 mmol/L Final         Passed - Last BP in normal range    BP Readings from Last 1 Encounters:  11/10/21 116/78         Passed - Valid encounter within last 6 months    Recent Outpatient Visits          5 months ago Type 2 diabetes mellitus with hyperglycemia, without long-term current use of insulin ([place])   [facility] [place], Coralie Keens, NP   10 months ago Type 2 diabetes mellitus with hyperglycemia, without long-term current use of insulin ([place])   [facility] [place], Coralie Keens, NP   1 year ago Type 2 diabetes mellitus with hyperglycemia, without long-term current use of insulin ([place])   [facility] [place], Coralie Keens, NP   1 year ago Primary hypertension   [facility] [place], [place], NP   1 year ago Paresthesia of both hands   [facility] [place], [place], [place]             . metFORMIN (GLUCOPHAGE) 1000 MG tablet 180 tablet 0    Sig: Take 1 tablet (1,000 mg total) by mouth 2 (two) times daily with a meal.     Endocrinology:   Diabetes - Biguanides Failed - 04/29/2022  8:30 PM      Failed - B12 Level in normal range and within 720 days    No results found for: "VITAMINB12"       Failed - CBC within normal limits and completed in the last 12 months    WBC  Date Value Ref Range Status  11/21/2021 7.1 4.0 - 10.5 K/uL Final   RBC  Date Value Ref Range Status  11/21/2021 4.81 4.22 - 5.81 MIL/uL Final   Hemoglobin  Date Value Ref Range Status  11/21/2021 11.8 (L) 13.0 - 17.0 g/dL Final   HGB  Date Value Ref Range Status  09/22/2012 12.5 (L) 13.0 - 18.0 g/dL Final   HCT  Date Value Ref Range Status  11/21/2021 37.5 (L) 39.0 - 52.0 % Final  09/22/2012 38.1 (L) 40.0 - 52.0 % Final   MCHC  Date Value Ref Range Status  11/21/2021 31.5 30.0 - 36.0 g/dL Final   [facility]  Date Value Ref Range Status  11/21/2021  24.5 (L) 26.0 - 34.0 pg Final   MCV  Date Value Ref Range Status  11/21/2021 78.0 (L) 80.0 - 100.0 fL Final  09/22/2012 81 80 - 100 fL Final   No results found for: "PLTCOUNTKUC", "LABPLAT", "POCPLA" RDW  Date Value Ref Range Status  11/21/2021 14.8 11.5 - 15.5 % Final  09/22/2012 15.0 (H) 11.5 - 14.5 % Final         Passed - Cr in normal range and within 360 days    Creat  Date Value Ref Range Status  03/13/2018 0.91 0.60 - 1.35 mg/dL Final   Creatinine, Ser  Date Value Ref Range Status  11/21/2021 1.10 0.61 - 1.24 mg/dL Final         Passed - HBA1C is between 0 and 7.9 and within 180 days    Hgb A1c MFr Bld  Date Value Ref Range Status  11/21/2021 7.3 (H) 4.8 - 5.6 % Final    Comment:    (NOTE) Pre diabetes:          5.7%-6.4%  Diabetes:              >6.4%  Glycemic control for   <7.0% adults with diabetes          Passed - eGFR in normal range and within 360 days    GFR, Est African American  Date Value Ref Range Status  03/13/2018 116 > OR = 60 mL/min/1.68m Final   GFR calc Af Amer  Date Value Ref Range Status  04/15/2020 >60 >60 mL/min Final   GFR, Est Non African  American  Date Value Ref Range Status  03/13/2018 100 > OR = 60 mL/min/1.78mFinal   GFR, Estimated  Date Value Ref Range Status  11/21/2021 >60 >60 mL/min Final    Comment:    (NOTE) Calculated using the CKD-EPI Creatinine Equation (2021)          Passed - Valid encounter within last 6 months    Recent Outpatient Visits          5 months ago Type 2 diabetes mellitus with hyperglycemia, without long-term current use of insulin ([facility]  [facility] Cent[facility] Flat[facility]Coralie KeensNP   10 months ago Type 2 diabetes mellitus with hyperglycemia, without long-term current use of insulin ([facility]  [place]oralie Keens[place]   1 year ago Type 2 diabetes mellitus with hyperglycemia, without long-term current use of insulin ([facility]  So[facility] [place] IncaJearld FentonNP   1 year ago Primary hypertension   SoWester[facility] Hospital[facility] HaddockNP   1 year ago Paresthesia of both hands   So[facility] Ltd[facility]Lupita RaiderFNNorth [place]

## 2022-05-06 ENCOUNTER — Other Ambulatory Visit: Payer: Self-pay

## 2022-05-16 ENCOUNTER — Other Ambulatory Visit: Payer: Self-pay | Admitting: Internal Medicine

## 2022-05-16 DIAGNOSIS — E039 Hypothyroidism, unspecified: Secondary | ICD-10-CM

## 2022-05-17 ENCOUNTER — Other Ambulatory Visit: Payer: Self-pay

## 2022-05-17 MED ORDER — LEVOTHYROXINE SODIUM 100 MCG PO TABS
ORAL_TABLET | Freq: Every day | ORAL | 0 refills | Status: DC
  Filled 2022-05-17: qty 90, 90d supply, fill #0

## 2022-05-17 MED ORDER — FLUTICASONE PROPIONATE 50 MCG/ACT NA SUSP
2.0000 | Freq: Every day | NASAL | 0 refills | Status: DC
  Filled 2022-05-17: qty 16, 30d supply, fill #0

## 2022-05-27 ENCOUNTER — Other Ambulatory Visit: Payer: Self-pay | Admitting: Internal Medicine

## 2022-05-27 IMAGING — CT CT CHEST W/ CM
3 of 5 series · 14 of 36 positions shown, 17 images · IV contrast (omnipaque)
Comparison: 09/04/2019

CLINICAL DATA: Follow-up renal cell carcinoma. Previous left
nephrectomy.

EXAM:
CT CHEST, ABDOMEN, AND PELVIS WITH CONTRAST
TECHNIQUE: Multidetector CT imaging of the chest, abdomen and pelvis was
performed following the standard protocol during bolus
administration of intravenous contrast.
CONTRAST:  100mL OMNIPAQUE IOHEXOL 300 MG/ML  SOLN

[Series 2: axials cap 5.00 · axial · 0.80mm/px · z∈[-1573,-1038]mm · 9 of 135 slices shown, 12 images]
[im 14/135  mediastinal]
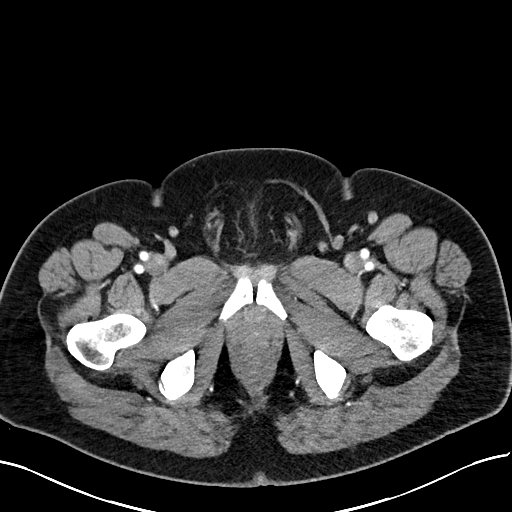
[im 14/135  lung]
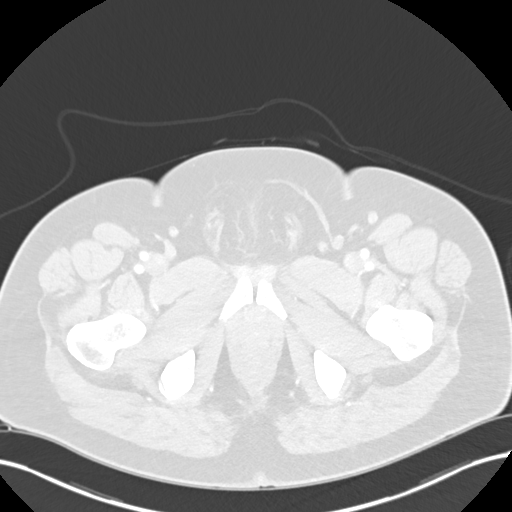
[im 27/135  lung]
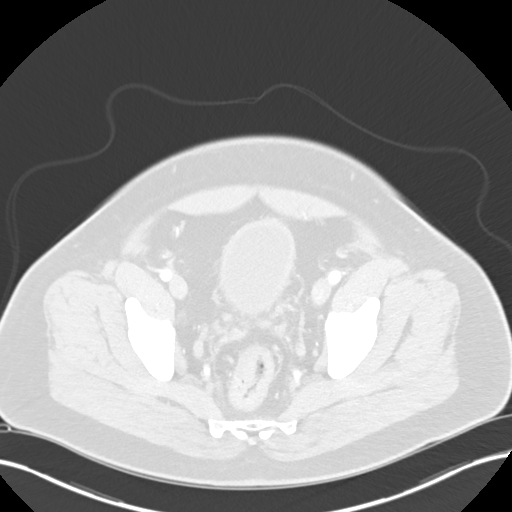
[im 41/135  lung]
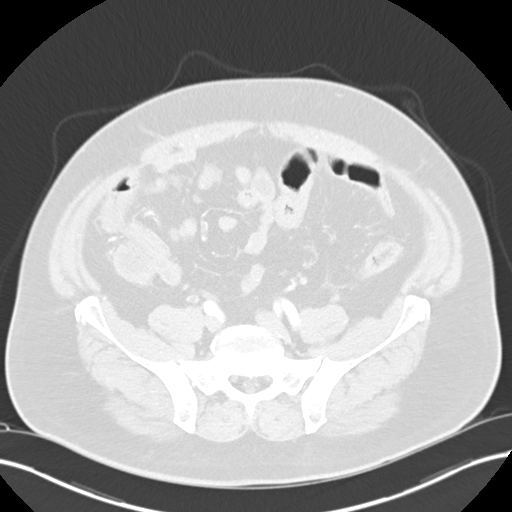
[im 54/135  lung]
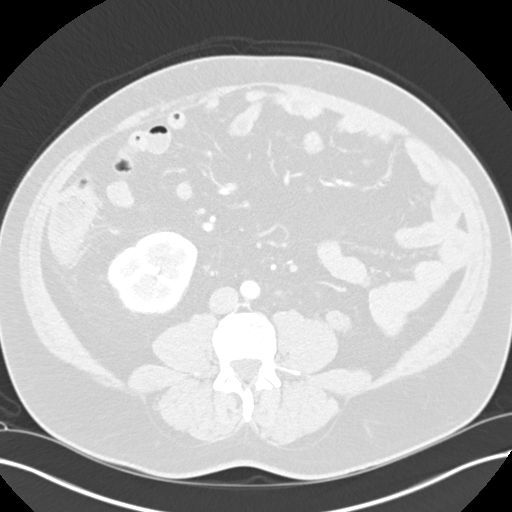
[im 68/135  mediastinal]
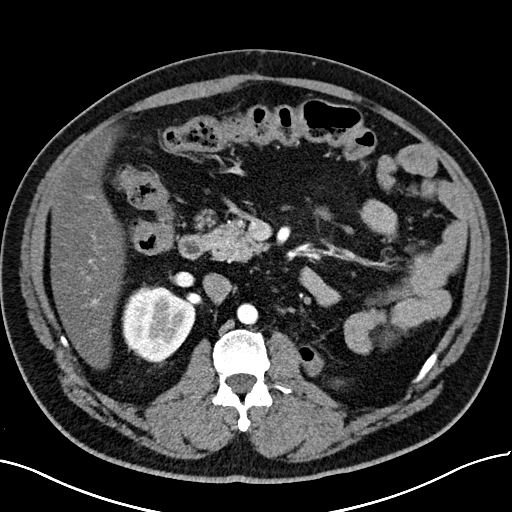
[im 68/135  lung]
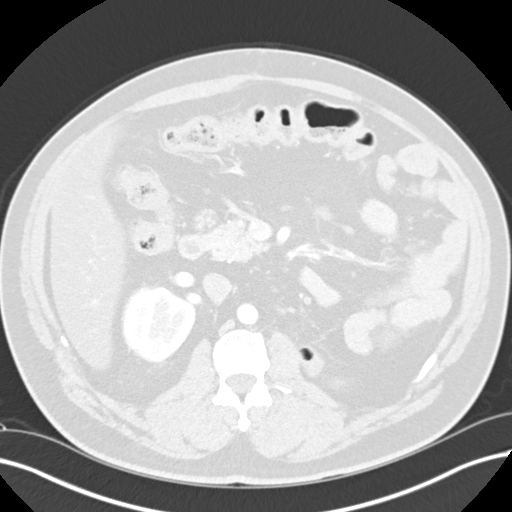
[im 81/135  lung]
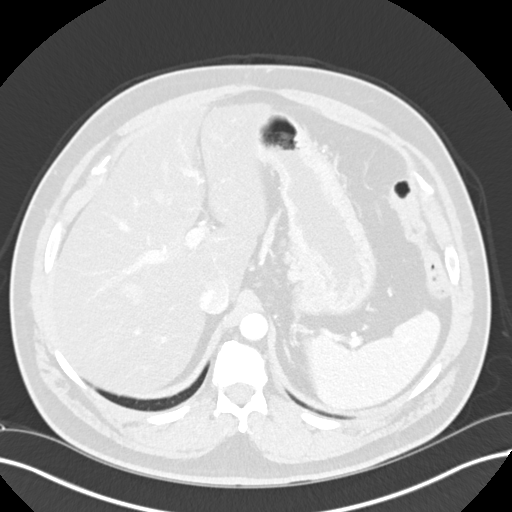
[im 94/135  lung]
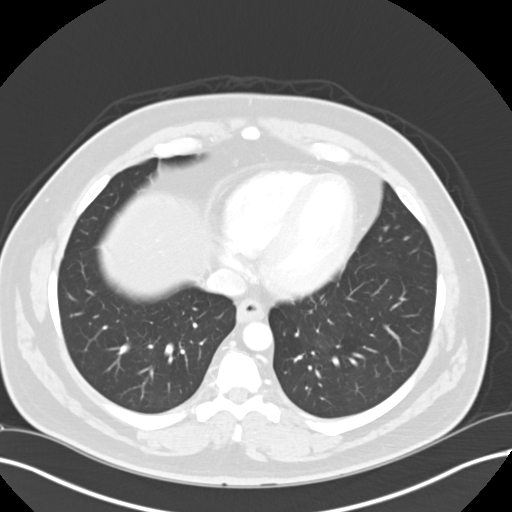
[im 108/135  lung]
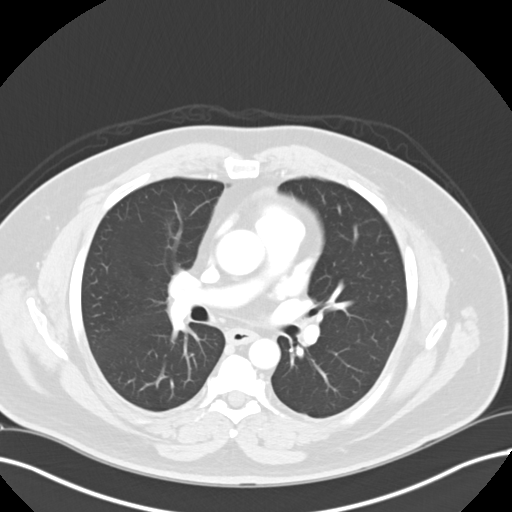
[im 121/135  mediastinal]
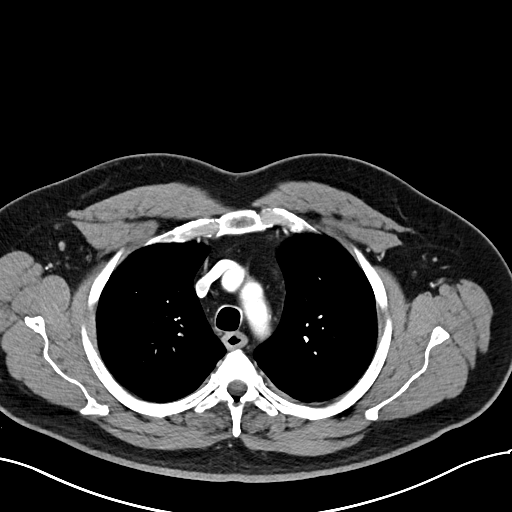
[im 121/135  lung]
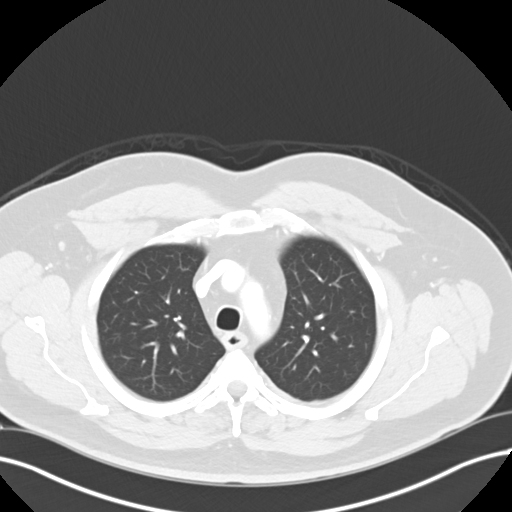

[Series 3: lungs cap 2.00 · axial · 0.75mm/px · z∈[-1243,-1193]mm · 2 of 151 slices shown]
[im 13/151  lung]
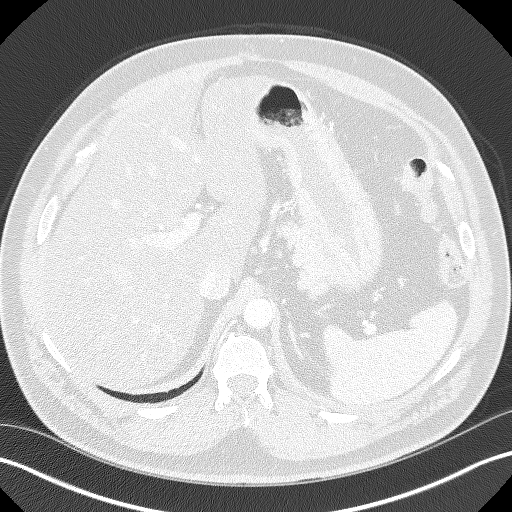
[im 38/151  lung]
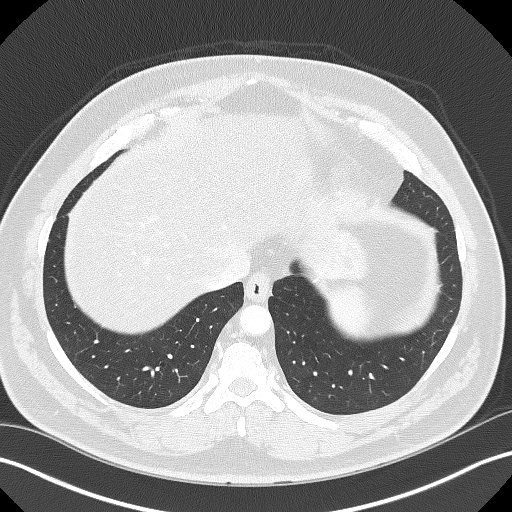

[Series 4: coronals cap 2.00 cor · coronal · 0.80mm/px · 3 of 176 slices shown]
[im 36/176  lung]
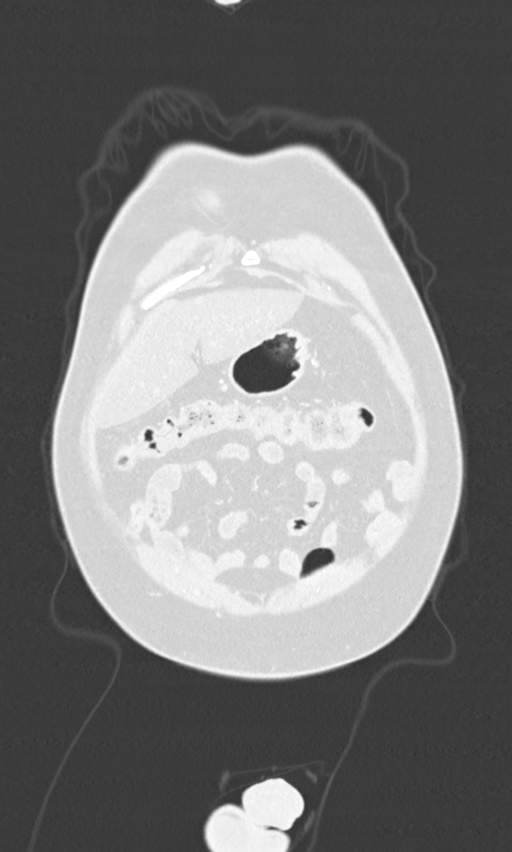
[im 71/176  lung]
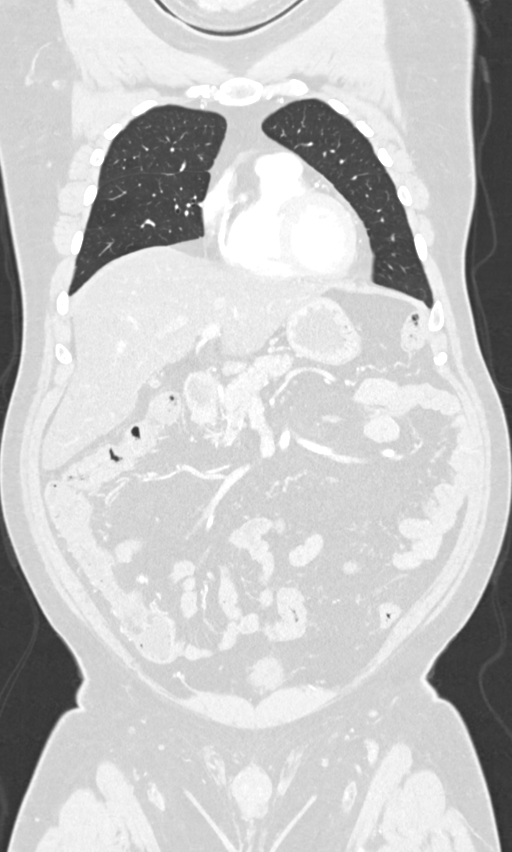
[im 106/176  lung]
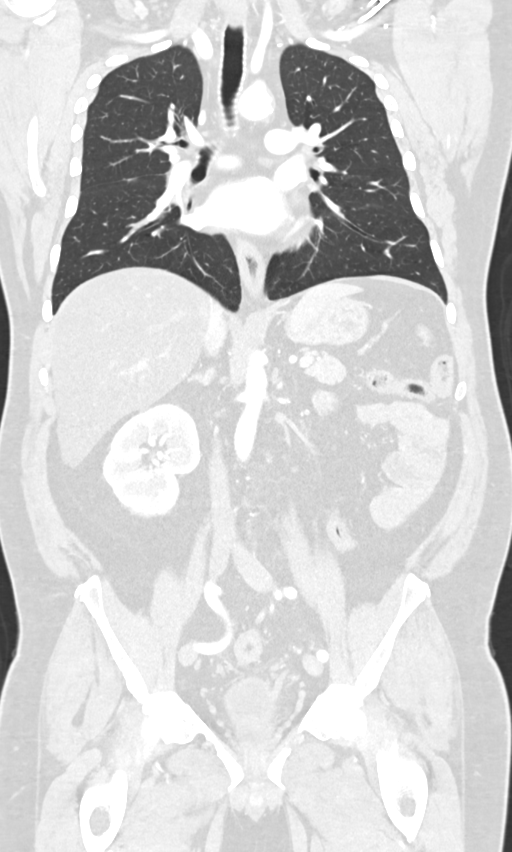

[14 of 36 positions shown; findings below may reference images not displayed]

FINDINGS: CT CHEST FINDINGS

Cardiovascular: No acute findings.

Mediastinum/Lymph Nodes: No masses or pathologically enlarged lymph
nodes identified.

Lungs/Pleura: 3 mm pulmonary nodule in right middle lobe remains
stable. No new or enlarging pulmonary nodules or masses are
identified. No evidence of pulmonary infiltrate or pleural effusion.

Musculoskeletal:  No suspicious bone lesions identified.

CT ABDOMEN AND PELVIS FINDINGS

Hepatobiliary: Severe diffuse hepatic steatosis again seen. No
masses identified. Gallbladder is unremarkable. No evidence of
biliary ductal dilatation.

Pancreas:  No mass or inflammatory changes.

Spleen:  Within normal limits in size and appearance.

Adrenals/Urinary tract: Stable postop changes from previous left
nephrectomy. Normal appearance right kidney and both adrenal glands.

Stomach/Bowel: No evidence of obstruction, inflammatory process, or
abnormal fluid collections.

Vascular/Lymphatic: No pathologically enlarged lymph nodes
identified. No abdominal aortic aneurysm. Aortic atherosclerosis
noted.

Reproductive:  No mass or other significant abnormality identified.

Other:  None.

Musculoskeletal:  No suspicious bone lesions identified.
IMPRESSION: No evidence of recurrent or metastatic carcinoma within the chest,
abdomen, or pelvis.

Stable severe hepatic steatosis.

Aortic Atherosclerosis ([id number]).

## 2022-05-28 ENCOUNTER — Other Ambulatory Visit: Payer: Self-pay

## 2022-05-28 MED ORDER — ATORVASTATIN CALCIUM 80 MG PO TABS
80.0000 mg | ORAL_TABLET | Freq: Every day | ORAL | 1 refills | Status: DC
  Filled 2022-05-28: qty 90, 90d supply, fill #0
  Filled 2022-08-24: qty 90, 90d supply, fill #1

## 2022-05-28 NOTE — Telephone Encounter (Signed)
Requested Prescriptions  Pending Prescriptions Disp Refills   atorvastatin (LIPITOR) 80 MG tablet 90 tablet 1    Sig: Take 1 tablet (80 mg total) by mouth once daily.     Cardiovascular:  Antilipid - Statins Failed - 05/27/2022  5:42 PM      Failed - Lipid Panel in normal range within the last 12 months    Cholesterol  Date Value Ref Range Status  11/21/2021 131 0 - 200 mg/dL Final   LDL Cholesterol (Calc)  Date Value Ref Range Status  03/13/2018 65 mg/dL (calc) Final    Comment:    Reference range: <100 . Desirable range <100 mg/dL for primary prevention;   <70 mg/dL for patients with CHD or diabetic patients  with > or = 2 CHD risk factors. Marland Kitchen LDL-C is now calculated using the Martin-Hopkins  calculation, which is a validated novel method providing  better accuracy than the Friedewald equation in the  estimation of LDL-C.  Cresenciano Genre et al. Annamaria Helling. 8921;194(17): 2061-2068  (http://education.[employer].com/faq/FAQ164)    LDL Cholesterol  Date Value Ref Range Status  11/21/2021 62 0 - 99 mg/dL Final    Comment:           Total Cholesterol/HDL:CHD Risk Coronary Heart Disease Risk Table                     Men   Women  1/2 Average Risk   3.4   3.3  Average Risk       5.0   4.4  2 X Average Risk   9.6   7.1  3 X Average Risk  23.4   11.0        Use the calculated Patient Ratio above and the CHD Risk Table to determine the patient's CHD Risk.        ATP III CLASSIFICATION (LDL):  <100     mg/dL   Optimal  100-129  mg/dL   Near or Above                    Optimal  130-159  mg/dL   Borderline  160-189  mg/dL   High  >190     mg/dL   Very High Performed at [facility], [place], [place] [address]    HDL  Date Value Ref Range Status  11/21/2021 28 (L) >40 mg/dL Final   Triglycerides  Date Value Ref Range Status  11/21/2021 206 (H) <150 mg/dL Final         Passed - Patient is not pregnant      Passed - Valid encounter within last  12 months    Recent Outpatient Visits           6 months ago Type 2 diabetes mellitus with hyperglycemia, without long-term current use of insulin [facility])   [facility], Coralie Keens, NP   11 months ago Type 2 diabetes mellitus with hyperglycemia, without long-term current use of insulin [facility])   [place], Coralie Keens, NP   1 year ago Type 2 diabetes mellitus with hyperglycemia, without long-term current use of insulin [facility])   [facility] [place], Coralie Keens, NP   1 year ago Primary hypertension   [place], NP   1 year ago Paresthesia of both hands   [facility], Lupita Raider, [place]

## 2022-06-03 ENCOUNTER — Other Ambulatory Visit: Payer: Self-pay | Admitting: Internal Medicine

## 2022-06-03 DIAGNOSIS — I10 Essential (primary) hypertension: Secondary | ICD-10-CM

## 2022-06-04 ENCOUNTER — Other Ambulatory Visit: Payer: Self-pay

## 2022-06-04 MED ORDER — CARVEDILOL 25 MG PO TABS
ORAL_TABLET | ORAL | 0 refills | Status: DC
  Filled 2022-06-04: qty 180, 90d supply, fill #0

## 2022-06-04 NOTE — Telephone Encounter (Signed)
Patient needs OV, will refill until OV is made.Patient needs OV for additional refills.  Requested Prescriptions  Pending Prescriptions Disp Refills   carvedilol (COREG) 25 MG tablet 180 tablet 0    Sig: TAKE 1 TABLET BY MOUTH TWICE A DAY WITH A MEAL.     Cardiovascular: Beta Blockers 3 Failed - 06/03/2022 10:13 PM      Failed - Valid encounter within last 6 months    Recent Outpatient Visits           6 months ago Type 2 diabetes mellitus with hyperglycemia, without long-term current use of insulin ([place])   [facility] [place], Coralie Keens, NP   11 months ago Type 2 diabetes mellitus with hyperglycemia, without long-term current use of insulin ([place])   [facility] [place], Coralie Keens, NP   1 year ago Type 2 diabetes mellitus with hyperglycemia, without long-term current use of insulin ([place])   [facility] [place], Coralie Keens, NP   1 year ago Primary hypertension   [facility] Kathrine Haddock, NP   1 year ago Paresthesia of both hands   [place], [place]              Passed - Cr in normal range and within 360 days    Creat  Date Value Ref Range Status  03/13/2018 0.91 0.60 - 1.35 mg/dL Final   Creatinine, Ser  Date Value Ref Range Status  11/21/2021 1.10 0.61 - 1.24 mg/dL Final         Passed - AST in normal range and within 360 days    AST  Date Value Ref Range Status  11/21/2021 28 15 - 41 U/L Final   SGOT(AST)  Date Value Ref Range Status  09/22/2012 28 15 - 37 Unit/L Final         Passed - ALT in normal range and within 360 days    ALT  Date Value Ref Range Status  11/21/2021 31 0 - 44 U/L Final   SGPT (ALT)  Date Value Ref Range Status  09/22/2012 31 12 - 78 U/L Final         Passed - Last BP in normal range    BP Readings from Last 1 Encounters:  11/10/21 116/78         Passed - Last Heart Rate in normal range    Pulse Readings from Last 1 Encounters:  11/10/21 73

## 2022-06-11 ENCOUNTER — Other Ambulatory Visit: Payer: Self-pay

## 2022-06-11 ENCOUNTER — Other Ambulatory Visit: Payer: Self-pay | Admitting: Internal Medicine

## 2022-06-11 DIAGNOSIS — I10 Essential (primary) hypertension: Secondary | ICD-10-CM

## 2022-06-11 MED FILL — Olmesartan Medoxomil Tab 40 MG: ORAL | 90 days supply | Qty: 90 | Fill #1 | Status: AC

## 2022-06-12 ENCOUNTER — Other Ambulatory Visit: Payer: Self-pay

## 2022-06-12 MED ORDER — CHLORTHALIDONE 25 MG PO TABS
ORAL_TABLET | ORAL | 0 refills | Status: DC
  Filled 2022-06-12: qty 15, 30d supply, fill #0

## 2022-06-12 NOTE — Telephone Encounter (Signed)
Requested medication (s) are due for refill today: yes  Requested medication (s) are on the active medication list: yes  Last refill:  04/30/22 #15 tabs  Future visit scheduled: yes  Notes to clinic:  overdue lab work must send back per protocol   Requested Prescriptions  Pending Prescriptions Disp Refills   chlorthalidone (HYGROTON) 25 MG tablet 15 tablet 0    Sig: TAKE 1/2 TABLET (12.5 MG) BY MOUTH ONCE EVERY DAY.     Cardiovascular: Diuretics - Thiazide Failed - 06/11/2022 11:42 AM      Failed - Cr in normal range and within 180 days    Creat  Date Value Ref Range Status  03/13/2018 0.91 0.60 - 1.35 mg/dL Final   Creatinine, Ser  Date Value Ref Range Status  11/21/2021 1.10 0.61 - 1.24 mg/dL Final         Failed - K in normal range and within 180 days    Potassium  Date Value Ref Range Status  11/21/2021 3.4 (L) 3.5 - 5.1 mmol/L Final  09/22/2012 3.9 3.5 - 5.1 mmol/L Final         Failed - Na in normal range and within 180 days    Sodium  Date Value Ref Range Status  11/21/2021 137 135 - 145 mmol/L Final  01/24/2017 143 134 - 144 mmol/L Final  09/22/2012 139 136 - 145 mmol/L Final         Failed - Valid encounter within last 6 months    Recent Outpatient Visits           7 months ago Type 2 diabetes mellitus with hyperglycemia, without long-term current use of insulin [facility])   [facility], Coralie Keens, NP   11 months ago Type 2 diabetes mellitus with hyperglycemia, without long-term current use of insulin [facility])   [facility] [place], Coralie Keens, NP   1 year ago Type 2 diabetes mellitus with hyperglycemia, without long-term current use of insulin [facility])   [facility], Coralie Keens, NP   1 year ago Primary hypertension   [place], NP   1 year ago Paresthesia of both hands   [facility], Lupita Raider, FNP       Future Appointments             In 1 week  Garnette Gunner, Coralie Keens, NP [place] BP in normal range    BP Readings from Last 1 Encounters:  11/10/21 116/78

## 2022-06-18 ENCOUNTER — Other Ambulatory Visit: Payer: Self-pay | Admitting: Internal Medicine

## 2022-06-18 ENCOUNTER — Other Ambulatory Visit: Payer: Self-pay

## 2022-06-19 ENCOUNTER — Encounter: Payer: Self-pay | Admitting: Internal Medicine

## 2022-06-19 ENCOUNTER — Other Ambulatory Visit
Admission: RE | Admit: 2022-06-19 | Discharge: 2022-06-19 | Disposition: A | Discharge status: Home / Self Care | Payer: Self-pay | Attending: Internal Medicine | Admitting: Internal Medicine

## 2022-06-19 ENCOUNTER — Other Ambulatory Visit: Payer: Self-pay

## 2022-06-19 ENCOUNTER — Ambulatory Visit (INDEPENDENT_AMBULATORY_CARE_PROVIDER_SITE_OTHER): Payer: Self-pay | Admitting: Internal Medicine

## 2022-06-19 VITALS — BP 144/92 | HR 84 | Temp 97.1°F | Wt 233.0 lb

## 2022-06-19 DIAGNOSIS — E1165 Type 2 diabetes mellitus with hyperglycemia: Secondary | ICD-10-CM

## 2022-06-19 DIAGNOSIS — I700 Atherosclerosis of aorta: Secondary | ICD-10-CM

## 2022-06-19 DIAGNOSIS — G4733 Obstructive sleep apnea (adult) (pediatric): Secondary | ICD-10-CM

## 2022-06-19 DIAGNOSIS — E785 Hyperlipidemia, unspecified: Secondary | ICD-10-CM

## 2022-06-19 DIAGNOSIS — E039 Hypothyroidism, unspecified: Secondary | ICD-10-CM | POA: Insufficient documentation

## 2022-06-19 DIAGNOSIS — Z6836 Body mass index (BMI) 36.0-36.9, adult: Secondary | ICD-10-CM

## 2022-06-19 DIAGNOSIS — D509 Iron deficiency anemia, unspecified: Secondary | ICD-10-CM

## 2022-06-19 DIAGNOSIS — I10 Essential (primary) hypertension: Secondary | ICD-10-CM

## 2022-06-19 DIAGNOSIS — I2510 Atherosclerotic heart disease of native coronary artery without angina pectoris: Secondary | ICD-10-CM

## 2022-06-19 DIAGNOSIS — E1169 Type 2 diabetes mellitus with other specified complication: Secondary | ICD-10-CM

## 2022-06-19 LAB — T4, FREE: Free T4: 0.88 ng/dL (ref 0.61–1.12)

## 2022-06-19 LAB — COMPREHENSIVE METABOLIC PANEL
ALT: 26 U/L (ref 0–44)
AST: 26 U/L (ref 15–41)
Albumin: 4 g/dL (ref 3.5–5.0)
Alkaline Phosphatase: 79 U/L (ref 38–126)
Anion gap: 10 (ref 5–15)
BUN: 19 mg/dL (ref 6–20)
CO2: 25 mmol/L (ref 22–32)
Calcium: 9.4 mg/dL (ref 8.9–10.3)
Chloride: 102 mmol/L (ref 98–111)
Creatinine, Ser: 1.01 mg/dL (ref 0.61–1.24)
GFR, Estimated: >60 mL/min (ref >60)
Glucose, Bld: 170 mg/dL — ABNORMAL HIGH (ref 70–99)
Potassium: 3.2 mmol/L — ABNORMAL LOW (ref 3.5–5.1)
Sodium: 137 mmol/L (ref 135–145)
Total Bilirubin: 0.9 mg/dL (ref 0.3–1.2)
Total Protein: 7.9 g/dL (ref 6.5–8.1)

## 2022-06-19 LAB — LIPID PANEL
Cholesterol: 114 mg/dL (ref 0–200)
HDL: 30 mg/dL — ABNORMAL LOW (ref >40)
LDL Cholesterol: 57 mg/dL (ref 0–99)
Total CHOL/HDL Ratio: 3.8 RATIO
Triglycerides: 135 mg/dL (ref <150)
VLDL: 27 mg/dL (ref 0–40)

## 2022-06-19 LAB — TSH: TSH: 2.527 u[IU]/mL (ref 0.350–4.500)

## 2022-06-19 LAB — POCT GLYCOSYLATED HEMOGLOBIN (HGB A1C): Hemoglobin A1C: 6.2 % — AB (ref 4.0–5.6)

## 2022-06-19 MED ORDER — POTASSIUM CHLORIDE CRYS ER 20 MEQ PO TBCR: 20.0000 meq | EXTENDED_RELEASE_TABLET | Freq: Two times a day (BID) | ORAL | 1 refills | Status: DC

## 2022-06-19 MED FILL — Metformin HCl Tab 1000 MG: ORAL | 90 days supply | Qty: 180 | Fill #0 | Status: AC

## 2022-06-19 MED FILL — Fluticasone Propionate Nasal Susp 50 MCG/ACT: NASAL | 30 days supply | Qty: 16 | Fill #0 | Status: AC

## 2022-06-19 NOTE — Assessment & Plan Note (Signed)
Encourage diet and exercise for weight loss 

## 2022-06-19 NOTE — Addendum Note (Signed)
Addended by: Jearld Fenton on: 06/19/2022 01:37 PM   Modules accepted: Orders

## 2022-06-19 NOTE — Assessment & Plan Note (Signed)
C-Met and lipid profile today Encouraged her to consume a low-fat diet Continue atorvastatin 

## 2022-06-19 NOTE — Assessment & Plan Note (Signed)
Uncontrolled but he admits he did not take his medication this morning Discussed the importance of medication compliance Reinforced DASH diet and exercise for weight loss C-Met today  RTC in 2 weeks for nurse visit BP check

## 2022-06-19 NOTE — Patient Instructions (Signed)

## 2022-06-19 NOTE — Assessment & Plan Note (Signed)
TSH and free T4 today We will adjust levothyroxine if needed based on labs 

## 2022-06-19 NOTE — Progress Notes (Signed)
Subjective:    Patient ID: Charles Mathis, male    DOB: 1970-11-05, 51 y.o.   MRN: [id number]  HPI  Patient presents to clinic today for follow-up of chronic conditions.  HTN: His BP today is 144/92.  He is taking Amlodipine, Carvedilol, Chlorthalidone and Olmesartan as prescribed but he admits he has not taken his blood pressure medication this morning.  ECG from 01/2019 reviewed.  HLD with CAD, Aortic Atherosclerosis: His last LDL was 62, triglycerides 206, 11/2021.  He denies myalgias on Atorvastatin.  He is taking Aspirin as well.  He does not consume a low-fat diet.  DM2: His last A1c was 7.3%, 11/2021.  He is taking Metformin as prescribed.  He does not check his sugars.  He does not check his feet routinely.  His last eye exam was >1-year ago.  Flu never.  Pneumovax never.  COVID Moderna x2.  Hypothyroidism: He denies any issues on his current dose of Levothyroxine.  He does not follow with endocrinology.  OSA: He averages 8 hours of sleep per night without the use of his CPAP.  There is no sleep study on file.  Anemia: His last H/H was 11.8/37.5, 11/2021.  He is not taking any oral Iron at this time.  He does not follow with hematology.  Review of Systems     Past Medical History:  Diagnosis Date   Allergy    Diabetes mellitus without complication ([place])    Hypertension    Renal cancer, left ([place]) 01/2019   Left Nephrectomy    Current Outpatient Medications  Medication Sig Dispense Refill   acetaminophen (TYLENOL) 325 MG tablet Take 650 mg by mouth every 6 (six) hours as needed for headache.     amLODipine (NORVASC) 10 MG tablet TAKE ONE TABLET BY MOUTH ONCE EVERY DAY. 90 tablet 0   aspirin EC 81 MG tablet Take 81 mg by mouth daily.     atorvastatin (LIPITOR) 80 MG tablet Take 1 tablet (80 mg total) by mouth once daily. 90 tablet 1   blood glucose meter kit and supplies Dispense based on patient and insurance preference. Use to check blood sugar daily as directed. DX:  E11.9. 1 each 0   carvedilol (COREG) 25 MG tablet TAKE 1 TABLET BY MOUTH TWICE A DAY WITH A MEAL. 180 tablet 0   chlorthalidone (HYGROTON) 25 MG tablet TAKE 1/2 TABLET (12.5 MG) BY MOUTH ONCE EVERY DAY. 15 tablet 0   fluticasone (FLONASE) 50 MCG/ACT nasal spray Place 2 sprays into both nostrils daily. 16 g 0   glucose blood (RIGHTEST GS550 BLOOD GLUCOSE) test strip Use to check blood sugar daily as directed 100 each 0   levothyroxine (SYNTHROID) 100 MCG tablet TAKE ONE TABLET BY MOUTH EVERY DAY 90 tablet 0   metFORMIN (GLUCOPHAGE) 1000 MG tablet Take 1 tablet (1,000 mg total) by mouth 2 (two) times daily with a meal. 60 tablet 0   montelukast (SINGULAIR) 10 MG tablet Take 1 tablet (10 mg total) by mouth once daily. 90 tablet 0   olmesartan (BENICAR) 40 MG tablet TAKE ONE TABLET BY MOUTH ONCE EVERY DAY. 90 tablet 1   potassium chloride SA (KLOR-CON M) 20 MEQ tablet TAKE 0.5 TABLET (10 MEQ TOTAL) BY MOUTH 2 TIMES A DAY 90 tablet 0   Rightest GL300 Lancets MISC Use to check blood sugar daily as directed 100 each 0   No current facility-administered medications for this visit.    No Known Allergies  Family History  Problem Relation Age of Onset   Diabetes Mother    Heart disease Mother    Mental illness Mother    Thyroid disease Mother    Heart disease Father    Diabetes Father    Mental illness Father     Social History   Socioeconomic History   Marital status: Married    Spouse name: Not on file   Number of children: Not on file   Years of education: Not on file   Highest education level: Not on file  Occupational History   Not on file  Tobacco Use   Smoking status: Never   Smokeless tobacco: Never  Vaping Use   Vaping Use: Never used  Substance and Sexual Activity   Alcohol use: No   Drug use: No   Sexual activity: Yes    Birth control/protection: Other-see comments    Comment: mutually monogamous relationship partner postmenopausal  Other Topics Concern   Not on file   Social History Narrative   In [place]/ with wife; son [downs syndrome]; no smoking/ no alcohol; works for [employer].    Social Determinants of Health   Financial Resource Strain: Not on file  Food Insecurity: Not on file  Transportation Needs: Not on file  Physical Activity: Not on file  Stress: Not on file  Social Connections: Not on file  Intimate Partner Violence: Not on file     Constitutional: Denies fever, malaise, fatigue, headache or abrupt weight changes.  HEENT: Denies eye pain, eye redness, ear pain, ringing in the ears, wax buildup, runny nose, nasal congestion, bloody nose, or sore throat. Respiratory: Denies difficulty breathing, shortness of breath, cough or sputum production.   Cardiovascular: Denies chest pain, chest tightness, palpitations or swelling in the hands or feet.  Gastrointestinal: Denies abdominal pain, bloating, constipation, diarrhea or blood in the stool.  GU: Denies urgency, frequency, pain with urination, burning sensation, blood in urine, odor or discharge. Musculoskeletal: Denies decrease in range of motion, difficulty with gait, muscle pain or joint pain and swelling.  Skin: Denies redness, rashes, lesions or ulcercations.  Neurological: Denies dizziness, difficulty with memory, difficulty with speech or problems with balance and coordination.  Psych: Denies anxiety, depression, SI/HI.  No other specific complaints in a complete review of systems (except as listed in HPI above).  Objective:   Physical Exam BP (!) 144/92 (BP Location: Right Arm, Patient Position: Sitting, Cuff Size: Large)   Pulse 84   Temp (!) 97.1 F (36.2 C) (Temporal)   Wt 233 lb (105.7 kg)   SpO2 96%   BMI 36.49 kg/m   Wt Readings from Last 3 Encounters:  06/22/21 257 lb 3.2 oz (116.7 kg)  12/14/20 256 lb 3.2 oz (116.2 kg)  09/15/20 262 lb (118.8 kg)    General: Appears his stated age, obese, in NAD. Skin: Warm, dry and intact. Hypopigmentation  noted of bilateral hands and left elbow. No ulcerations noted. HEENT: Head: normal shape and size; Eyes: sclera white, no icterus, conjunctiva pink, PERRLA and EOMs intact;  Cardiovascular: Normal rate and rhythm. S1,S2 noted.  No murmur, rubs or gallops noted. No JVD or BLE edema. No carotid bruits noted. Pulmonary/Chest: Normal effort and positive vesicular breath sounds. No respiratory distress. No wheezes, rales or ronchi noted.  Musculoskeletal: No difficulty with gait.  Neurological: Alert and oriented. Coordination normal.  Psychiatric: Mood and affect normal. Behavior is normal. Judgment and thought content normal.    BMET    Component Value Date/Time  NA 137 11/21/2021 0840   NA 143 01/24/2017 1306   NA 139 09/22/2012 1156   K 3.4 (L) 11/21/2021 0840   K 3.9 09/22/2012 1156   CL 103 11/21/2021 0840   CL 106 09/22/2012 1156   CO2 26 11/21/2021 0840   CO2 29 09/22/2012 1156   GLUCOSE 147 (H) 11/21/2021 0840   GLUCOSE 96 09/22/2012 1156   BUN 21 (H) 11/21/2021 0840   BUN 20 01/24/2017 1306   BUN 14 09/22/2012 1156   CREATININE 1.10 11/21/2021 0840   CREATININE 0.91 03/13/2018 0823   CALCIUM 9.3 11/21/2021 0840   CALCIUM 8.6 09/22/2012 1156   GFRNONAA >60 11/21/2021 0840   GFRNONAA 100 03/13/2018 0823   GFRAA >60 04/15/2020 0947   GFRAA 116 03/13/2018 0823    Lipid Panel     Component Value Date/Time   CHOL 131 11/21/2021 0840   TRIG 206 (H) 11/21/2021 0840   HDL 28 (L) 11/21/2021 0840   CHOLHDL 4.7 11/21/2021 0840   VLDL 41 (H) 11/21/2021 0840   LDLCALC 62 11/21/2021 0840   LDLCALC 65 03/13/2018 0823    CBC    Component Value Date/Time   WBC 7.1 11/21/2021 0840   RBC 4.81 11/21/2021 0840   HGB 11.8 (L) 11/21/2021 0840   HGB 12.5 (L) 09/22/2012 1156   HCT 37.5 (L) 11/21/2021 0840   HCT 38.1 (L) 09/22/2012 1156   PLT 244 11/21/2021 0840   PLT 275 09/22/2012 1156   MCV 78.0 (L) 11/21/2021 0840   MCV 81 09/22/2012 1156   MCH 24.5 (L) 11/21/2021 0840    MCHC 31.5 11/21/2021 0840   RDW 14.8 11/21/2021 0840   RDW 15.0 (H) 09/22/2012 1156   LYMPHSABS 1.8 12/16/2020 0831   MONOABS 0.8 12/16/2020 0831   EOSABS 0.6 (H) 12/16/2020 0831   BASOSABS 0.0 12/16/2020 0831    Hgb A1C Lab Results  Component Value Date   HGBA1C 7.3 (H) 11/21/2021           Assessment & Plan:     RTC in 6 months, follow-up chronic conditions Webb Sil[place], NP

## 2022-06-19 NOTE — Assessment & Plan Note (Signed)
Not currently taking iron

## 2022-06-19 NOTE — Assessment & Plan Note (Signed)
C-Met and lipid profile today Encouraged him to consume a low-fat diet Continue atorvastatin and aspirin

## 2022-06-19 NOTE — Assessment & Plan Note (Signed)
POCT A1c 6.2% Urine microalbumin has been checked within the last year Continue metformin Encourage low-carb diet and exercise for weight loss Advised him to schedule an appointment for an eye exam Encourage routine foot exam He declines flu and Pneumovax Encouraged him to get his COVID-booster

## 2022-06-19 NOTE — Telephone Encounter (Signed)
Requested medication (s) are due for refill today: yes  Requested medication (s) are on the active medication list:yes  Last refill:  04/30/22  Future visit scheduled: yes  Notes to clinic:  Unable to refill per protocol, courtesy refill already given, routing for provider approval.      Requested Prescriptions  Pending Prescriptions Disp Refills   fluticasone (FLONASE) 50 MCG/ACT nasal spray 16 g 0    Sig: Place 2 sprays into both nostrils daily.     Ear, Nose, and Throat: Nasal Preparations - Corticosteroids Passed - 06/18/2022  4:01 PM      Passed - Valid encounter within last 12 months    Recent Outpatient Visits           7 months ago Type 2 diabetes mellitus with hyperglycemia, without long-term current use of insulin ([place])   [facility], Coralie [facility], NP   12 months ago Type 2 diabetes mellitus with hyperglycemia, without long-term current use of insulin ([place])   [facility] [place], Coralie Keens, NP   1 year ago Type 2 diabetes mellitus with hyperglycemia, without long-term current use of insulin ([place])   [facility] [place], Coralie Keens, NP   1 year ago Primary hypertension   [place], NP   2 years ago Paresthesia of both hands   [place], FNP       Future Appointments             Today Garnette Gunner, Coralie Keens, NP [facility]             metFORMIN (GLUCOPHAGE) 1000 MG tablet 60 tablet 0    Sig: Take 1 tablet (1,000 mg total) by mouth 2 (two) times daily with a meal.     Endocrinology:  Diabetes - Biguanides Failed - 06/18/2022  4:01 PM      Failed - HBA1C is between 0 and 7.9 and within 180 days    Hgb A1c MFr Bld  Date Value Ref Range Status  11/21/2021 7.3 (H) 4.8 - 5.6 % Final    Comment:    (NOTE) Pre diabetes:          5.7%-6.4%  Diabetes:              >6.4%  Glycemic control for   <7.0% adults with diabetes           Failed - B12 Level in normal range and within 720 days    No results found for: "VITAMINB12"       Failed - Valid encounter within last 6 months    Recent Outpatient Visits           7 months ago Type 2 diabetes mellitus with hyperglycemia, without long-term current use of insulin [facility])   [facility] [place], Coralie Keens, NP   12 months ago Type 2 diabetes mellitus with hyperglycemia, without long-term current use of insulin [facility])   [facility] [place], [place] W, NP   1 year ago Type 2 diabetes mellitus with hyperglycemia, without long-term current use of insulin [facility])   [facility] [place], Coralie Keens, NP   1 year ago Primary hypertension   [place], NP   2 years ago Paresthesia of both hands   [facility], Lupita Raider, [place]       Future Appointments  Today Jearld Fenton, NP [facility], [facility]            Failed - CBC within normal limits and completed in the last 12 months    WBC  Date Value Ref Range Status  11/21/2021 7.1 4.0 - 10.5 K/uL Final   RBC  Date Value Ref Range Status  11/21/2021 4.81 4.22 - 5.81 MIL/uL Final   Hemoglobin  Date Value Ref Range Status  11/21/2021 11.8 (L) 13.0 - 17.0 g/dL Final   HGB  Date Value Ref Range Status  09/22/2012 12.5 (L) 13.0 - 18.0 g/dL Final   HCT  Date Value Ref Range Status  11/21/2021 37.5 (L) 39.0 - 52.0 % Final  09/22/2012 38.1 (L) 40.0 - 52.0 % Final   MCHC  Date Value Ref Range Status  11/21/2021 31.5 30.0 - 36.0 g/dL Final   [facility]  Date Value Ref Range Status  11/21/2021 24.5 (L) 26.0 - 34.0 pg Final   MCV  Date Value Ref Range Status  11/21/2021 78.0 (L) 80.0 - 100.0 fL Final  09/22/2012 81 80 - 100 fL Final   No results found for: "PLTCOUNTKUC", "LABPLAT", "POCPLA" RDW  Date Value Ref Range Status  11/21/2021 14.8 11.5 - 15.5 % Final  09/22/2012 15.0 (H) 11.5 - 14.5 % Final          Passed - Cr in normal range and within 360 days    Creat  Date Value Ref Range Status  03/13/2018 0.91 0.60 - 1.35 mg/dL Final   Creatinine, Ser  Date Value Ref Range Status  11/21/2021 1.10 0.61 - 1.24 mg/dL Final         Passed - eGFR in normal range and within 360 days    GFR, Est African American  Date Value Ref Range Status  03/13/2018 116 > OR = 60 mL/min/1.77m Final   GFR calc Af Amer  Date Value Ref Range Status  04/15/2020 >60 >60 mL/min Final   GFR, Est Non African American  Date Value Ref Range Status  03/13/2018 100 > OR = 60 mL/min/1.73mFinal   GFR, Estimated  Date Value Ref Range Status  11/21/2021 >60 >60 mL/min Final    Comment:    (NOTE) Calculated using the CKD-EPI Creatinine Equation (2021)

## 2022-06-19 NOTE — Assessment & Plan Note (Signed)
Encourage weight loss as this can help reduce sleep apnea symptoms Noncompliant with CPAP use

## 2022-06-19 NOTE — Assessment & Plan Note (Signed)
C-Met and lipid profile today Encouraged him to consume low-fat diet Continue atorvastatin and aspirin Discussed the importance of good blood pressure, cholesterol and diabetes control

## 2022-06-20 ENCOUNTER — Other Ambulatory Visit: Payer: Self-pay

## 2022-06-28 ENCOUNTER — Other Ambulatory Visit: Payer: Self-pay

## 2022-06-28 ENCOUNTER — Other Ambulatory Visit: Payer: Self-pay | Admitting: Internal Medicine

## 2022-06-28 DIAGNOSIS — I10 Essential (primary) hypertension: Secondary | ICD-10-CM

## 2022-06-28 NOTE — Telephone Encounter (Signed)
Refused due to a dose change.   Changed to 1 tablet 2 times a day.   Was 1/2 tablet 2 times a day.

## 2022-07-01 ENCOUNTER — Other Ambulatory Visit: Payer: Self-pay | Admitting: Internal Medicine

## 2022-07-01 ENCOUNTER — Other Ambulatory Visit: Payer: Self-pay

## 2022-07-03 ENCOUNTER — Other Ambulatory Visit: Payer: Self-pay

## 2022-07-03 ENCOUNTER — Telehealth: Payer: Self-pay

## 2022-07-03 ENCOUNTER — Ambulatory Visit: Payer: Self-pay

## 2022-07-03 DIAGNOSIS — I10 Essential (primary) hypertension: Secondary | ICD-10-CM

## 2022-07-03 MED ORDER — POTASSIUM CHLORIDE CRYS ER 20 MEQ PO TBCR: 20.0000 meq | EXTENDED_RELEASE_TABLET | Freq: Two times a day (BID) | ORAL | 1 refills | Status: DC

## 2022-07-03 MED FILL — Montelukast Sodium Tab 10 MG (Base Equiv): ORAL | 90 days supply | Qty: 90 | Fill #0 | Status: AC

## 2022-07-03 NOTE — Telephone Encounter (Signed)
Requested Prescriptions  Pending Prescriptions Disp Refills   montelukast (SINGULAIR) 10 MG tablet 90 tablet 2    Sig: Take 1 tablet (10 mg total) by mouth once daily.     Pulmonology:  Leukotriene Inhibitors Passed - 07/01/2022  8:53 PM      Passed - Valid encounter within last 12 months    Recent Outpatient Visits           2 weeks ago Type 2 diabetes mellitus with hyperglycemia, without long-term current use of insulin [facility])   [facility] [place], [place] W, NP   7 months ago Type 2 diabetes mellitus with hyperglycemia, without long-term current use of insulin [facility])   [facility] [place], Coralie Keens, NP   1 year ago Type 2 diabetes mellitus with hyperglycemia, without long-term current use of insulin [facility])   [facility] [place], Coralie Keens, NP   1 year ago Type 2 diabetes mellitus with hyperglycemia, without long-term current use of insulin [facility])   [facility], Coralie Keens, NP   1 year ago Primary hypertension   [facility] Kathrine Haddock, NP

## 2022-07-03 NOTE — Progress Notes (Signed)
Hypertension, follow-up  BP Readings from Last 3 Encounters:  06/19/22 (!) 144/92  11/10/21 116/78  06/22/21 127/74   Wt Readings from Last 3 Encounters:  06/19/22 233 lb (105.7 kg)  06/22/21 257 lb 3.2 oz (116.7 kg)  12/14/20 256 lb 3.2 oz (116.2 kg)     He was last seen for hypertension 06/19/2022 BP at that visit was 144/92. Management since that visit includes .  He reports excellent compliance with treatment. He is not having side effects.   --------------------------------------------------------------------------------------------------- Right Arm: 134/78

## 2022-07-03 NOTE — Telephone Encounter (Signed)
Per MAR Potassium chloride SA (KLOR-CON) 20 mEq was "phoned in" pt called to report he called the pharmacy and they do not have the reorder.

## 2022-07-04 ENCOUNTER — Other Ambulatory Visit: Payer: Self-pay

## 2022-07-04 MED ORDER — POTASSIUM CHLORIDE CRYS ER 20 MEQ PO TBCR
20.0000 meq | EXTENDED_RELEASE_TABLET | Freq: Two times a day (BID) | ORAL | 1 refills | Status: DC
  Filled 2022-07-04: qty 180, 90d supply, fill #0
  Filled 2022-09-29 – 2022-09-30 (×2): qty 180, 90d supply, fill #1

## 2022-07-04 NOTE — Addendum Note (Signed)
Addended by: Jearld Fenton on: 07/04/2022 08:42 AM   Modules accepted: Orders

## 2022-07-04 NOTE — Telephone Encounter (Signed)
Medication sent to pharmacy  

## 2022-07-09 ENCOUNTER — Other Ambulatory Visit: Payer: Self-pay | Admitting: Internal Medicine

## 2022-07-09 DIAGNOSIS — I10 Essential (primary) hypertension: Secondary | ICD-10-CM

## 2022-07-10 ENCOUNTER — Other Ambulatory Visit: Payer: Self-pay | Admitting: Internal Medicine

## 2022-07-10 ENCOUNTER — Other Ambulatory Visit: Payer: Self-pay

## 2022-07-10 DIAGNOSIS — I10 Essential (primary) hypertension: Secondary | ICD-10-CM

## 2022-07-11 ENCOUNTER — Other Ambulatory Visit: Payer: Self-pay

## 2022-07-12 ENCOUNTER — Other Ambulatory Visit: Payer: Self-pay

## 2022-07-12 MED FILL — Chlorthalidone Tab 25 MG: ORAL | 30 days supply | Qty: 15 | Fill #0 | Status: AC

## 2022-07-13 ENCOUNTER — Other Ambulatory Visit: Payer: Self-pay | Admitting: Internal Medicine

## 2022-07-14 ENCOUNTER — Other Ambulatory Visit (HOSPITAL_COMMUNITY): Payer: Self-pay

## 2022-07-14 MED ORDER — FLUTICASONE PROPIONATE 50 MCG/ACT NA SUSP
2.0000 | Freq: Every day | NASAL | 1 refills | Status: DC
  Filled 2022-07-14 – 2022-07-23 (×2): qty 48, 90d supply, fill #0
  Filled 2022-10-16: qty 48, 90d supply, fill #1

## 2022-07-14 NOTE — Telephone Encounter (Signed)
Requested Prescriptions  Pending Prescriptions Disp Refills   fluticasone (FLONASE) 50 MCG/ACT nasal spray 16 g 0    Sig: Place 2 sprays into both nostrils daily.     Ear, Nose, and Throat: Nasal Preparations - Corticosteroids Passed - 07/13/2022  6:27 PM      Passed - Valid encounter within last 12 months    Recent Outpatient Visits           3 weeks ago Type 2 diabetes mellitus with hyperglycemia, without long-term current use of insulin [facility])   [facility] [place], [place] W, NP   8 months ago Type 2 diabetes mellitus with hyperglycemia, without long-term current use of insulin [facility])   [facility] [place], Coralie Keens, NP   1 year ago Type 2 diabetes mellitus with hyperglycemia, without long-term current use of insulin [facility])   [facility] [place], Coralie Keens, NP   1 year ago Type 2 diabetes mellitus with hyperglycemia, without long-term current use of insulin [facility])   [facility] [place], Coralie Keens, NP   1 year ago Primary hypertension   [facility] Kathrine Haddock, NP

## 2022-07-18 ENCOUNTER — Other Ambulatory Visit: Payer: Self-pay | Admitting: Internal Medicine

## 2022-07-18 DIAGNOSIS — I10 Essential (primary) hypertension: Secondary | ICD-10-CM

## 2022-07-19 ENCOUNTER — Other Ambulatory Visit: Payer: Self-pay

## 2022-07-19 MED ORDER — AMLODIPINE BESYLATE 10 MG PO TABS
ORAL_TABLET | Freq: Every day | ORAL | 1 refills | Status: DC
  Filled 2022-07-19: qty 90, 90d supply, fill #0
  Filled 2022-10-16: qty 90, 90d supply, fill #1

## 2022-07-19 NOTE — Telephone Encounter (Signed)
Requested Prescriptions  Pending Prescriptions Disp Refills   amLODipine (NORVASC) 10 MG tablet 90 tablet 1    Sig: TAKE ONE TABLET BY MOUTH ONCE EVERY DAY.     Cardiovascular: Calcium Channel Blockers 2 Failed - 07/18/2022  6:11 PM      Failed - Last BP in normal range    BP Readings from Last 1 Encounters:  06/19/22 (!) 144/92         Passed - Last Heart Rate in normal range    Pulse Readings from Last 1 Encounters:  06/19/22 84         Passed - Valid encounter within last 6 months    Recent Outpatient Visits           1 month ago Type 2 diabetes mellitus with hyperglycemia, without long-term current use of insulin ([place])   [facility] [place], Coralie Keens, NP   8 months ago Type 2 diabetes mellitus with hyperglycemia, without long-term current use of insulin [facility])   [facility] [place], Coralie Keens, NP   1 year ago Type 2 diabetes mellitus with hyperglycemia, without long-term current use of insulin [facility])   [facility] [place], Coralie Keens, NP   1 year ago Type 2 diabetes mellitus with hyperglycemia, without long-term current use of insulin [facility])   [facility] [place], Coralie Keens, NP   1 year ago Primary hypertension   [facility] Kathrine Haddock, NP

## 2022-07-23 ENCOUNTER — Other Ambulatory Visit: Payer: Self-pay

## 2022-07-26 ENCOUNTER — Other Ambulatory Visit (HOSPITAL_COMMUNITY): Payer: Self-pay

## 2022-08-07 ENCOUNTER — Other Ambulatory Visit: Payer: Self-pay | Admitting: Internal Medicine

## 2022-08-07 DIAGNOSIS — I10 Essential (primary) hypertension: Secondary | ICD-10-CM

## 2022-08-08 ENCOUNTER — Other Ambulatory Visit: Payer: Self-pay

## 2022-08-08 MED ORDER — CHLORTHALIDONE 25 MG PO TABS
12.5000 mg | ORAL_TABLET | Freq: Every day | ORAL | 0 refills | Status: DC
  Filled 2022-08-08: qty 45, 90d supply, fill #0

## 2022-08-08 NOTE — Telephone Encounter (Signed)
Requested Prescriptions  Pending Prescriptions Disp Refills   chlorthalidone (HYGROTON) 25 MG tablet 45 tablet 0    Sig: TAKE 1/2 TABLET (12.5 MG) BY MOUTH ONCE EVERY DAY.     Cardiovascular: Diuretics - Thiazide Failed - 08/07/2022  6:46 PM      Failed - K in normal range and within 180 days    Potassium  Date Value Ref Range Status  06/19/2022 3.2 (L) 3.5 - 5.1 mmol/L Final  09/22/2012 3.9 3.5 - 5.1 mmol/L Final         Failed - Last BP in normal range    BP Readings from Last 1 Encounters:  06/19/22 (!) 144/92         Passed - Cr in normal range and within 180 days    Creat  Date Value Ref Range Status  03/13/2018 0.91 0.60 - 1.35 mg/dL Final   Creatinine, Ser  Date Value Ref Range Status  06/19/2022 1.01 0.61 - 1.24 mg/dL Final         Passed - Na in normal range and within 180 days    Sodium  Date Value Ref Range Status  06/19/2022 137 135 - 145 mmol/L Final  01/24/2017 143 134 - 144 mmol/L Final  09/22/2012 139 136 - 145 mmol/L Final         Passed - Valid encounter within last 6 months    Recent Outpatient Visits           1 month ago Type 2 diabetes mellitus with hyperglycemia, without long-term current use of insulin [facility])   [facility] [place], Coralie Keens, NP   9 months ago Type 2 diabetes mellitus with hyperglycemia, without long-term current use of insulin [facility])   [facility] [place], [place], [place]   1 year ago Type 2 diabetes mellitus with hyperglycemia, without long-term current use of insulin [facility])   [facility] [place], [place] W, NP   1 year ago Type 2 diabetes mellitus with hyperglycemia, without long-term current use of insulin [facility])   [facility] [place], Coralie Keens, NP   1 year ago Primary hypertension   [facility] Kathrine Haddock, [place]

## 2022-08-09 LAB — HM DIABETES EYE EXAM

## 2022-08-13 ENCOUNTER — Other Ambulatory Visit: Payer: Self-pay | Admitting: Internal Medicine

## 2022-08-13 DIAGNOSIS — E039 Hypothyroidism, unspecified: Secondary | ICD-10-CM

## 2022-08-14 ENCOUNTER — Other Ambulatory Visit: Payer: Self-pay

## 2022-08-14 ENCOUNTER — Other Ambulatory Visit: Payer: Self-pay | Admitting: Internal Medicine

## 2022-08-14 DIAGNOSIS — E039 Hypothyroidism, unspecified: Secondary | ICD-10-CM

## 2022-08-14 MED FILL — Levothyroxine Sodium Tab 100 MCG: ORAL | 90 days supply | Qty: 90 | Fill #0 | Status: AC

## 2022-08-14 NOTE — Telephone Encounter (Signed)
Requested Prescriptions  Pending Prescriptions Disp Refills   levothyroxine (SYNTHROID) 100 MCG tablet 90 tablet 3    Sig: TAKE ONE TABLET BY MOUTH EVERY DAY     Endocrinology:  Hypothyroid Agents Passed - 08/14/2022 10:15 AM      Passed - TSH in normal range and within 360 days    TSH  Date Value Ref Range Status  06/19/2022 2.527 0.350 - 4.500 uIU/mL Final    Comment:    Performed by a 3rd Generation assay with a functional sensitivity of <=0.01 uIU/mL. Performed at [facility], [address] [place], [place] [address]   03/13/2018 5.25 (H) 0.40 - 4.50 mIU/L Final         Passed - Valid encounter within last 12 months    Recent Outpatient Visits           1 month ago Type 2 diabetes mellitus with hyperglycemia, without long-term current use of insulin [facility])   [facility] [place], [place] W, NP   9 months ago Type 2 diabetes mellitus with hyperglycemia, without long-term current use of insulin [facility])   [facility] [place], [place] W, NP   1 year ago Type 2 diabetes mellitus with hyperglycemia, without long-term current use of insulin [facility])   [facility] [place], [place] W, NP   1 year ago Type 2 diabetes mellitus with hyperglycemia, without long-term current use of insulin [facility])   [facility] [place], Coralie Keens, NP   1 year ago Primary hypertension   [facility] Kathrine Haddock, [place]

## 2022-08-15 ENCOUNTER — Other Ambulatory Visit: Payer: Self-pay

## 2022-08-17 ENCOUNTER — Other Ambulatory Visit: Payer: Self-pay

## 2022-08-24 ENCOUNTER — Other Ambulatory Visit: Payer: Self-pay

## 2022-08-27 ENCOUNTER — Other Ambulatory Visit: Payer: Self-pay

## 2022-08-28 ENCOUNTER — Other Ambulatory Visit: Payer: Self-pay

## 2022-08-28 ENCOUNTER — Other Ambulatory Visit: Payer: Self-pay | Admitting: Internal Medicine

## 2022-08-28 DIAGNOSIS — I10 Essential (primary) hypertension: Secondary | ICD-10-CM

## 2022-08-29 ENCOUNTER — Other Ambulatory Visit: Payer: Self-pay

## 2022-08-29 MED FILL — Carvedilol Tab 25 MG: ORAL | 90 days supply | Qty: 180 | Fill #0 | Status: AC

## 2022-08-29 NOTE — Telephone Encounter (Signed)
Requested Prescriptions  Pending Prescriptions Disp Refills   carvedilol (COREG) 25 MG tablet 180 tablet 0    Sig: TAKE 1 TABLET BY MOUTH TWICE A DAY WITH A MEAL.     Cardiovascular: Beta Blockers 3 Failed - 08/28/2022  5:55 PM      Failed - Last BP in normal range    BP Readings from Last 1 Encounters:  06/19/22 (!) 144/92         Passed - Cr in normal range and within 360 days    Creat  Date Value Ref Range Status  03/13/2018 0.91 0.60 - 1.35 mg/dL Final   Creatinine, Ser  Date Value Ref Range Status  06/19/2022 1.01 0.61 - 1.24 mg/dL Final         Passed - AST in normal range and within 360 days    AST  Date Value Ref Range Status  06/19/2022 26 15 - 41 U/L Final   SGOT(AST)  Date Value Ref Range Status  09/22/2012 28 15 - 37 Unit/L Final         Passed - ALT in normal range and within 360 days    ALT  Date Value Ref Range Status  06/19/2022 26 0 - 44 U/L Final   SGPT (ALT)  Date Value Ref Range Status  09/22/2012 31 12 - 78 U/L Final         Passed - Last Heart Rate in normal range    Pulse Readings from Last 1 Encounters:  06/19/22 84         Passed - Valid encounter within last 6 months    Recent Outpatient Visits           2 months ago Type 2 diabetes mellitus with hyperglycemia, without long-term current use of insulin [facility])   [facility] [place], [place] W, NP   9 months ago Type 2 diabetes mellitus with hyperglycemia, without long-term current use of insulin [facility])   [facility] [place], [place] W, NP   1 year ago Type 2 diabetes mellitus with hyperglycemia, without long-term current use of insulin [facility])   [place], [place] W, NP   1 year ago Type 2 diabetes mellitus with hyperglycemia, without long-term current use of insulin [facility])   [place], Coralie Keens, NP   1 year ago Primary hypertension   [place] Kathrine Haddock, [place]

## 2022-08-30 ENCOUNTER — Other Ambulatory Visit: Payer: Self-pay

## 2022-09-07 ENCOUNTER — Other Ambulatory Visit: Payer: Self-pay

## 2022-09-15 ENCOUNTER — Other Ambulatory Visit: Payer: Self-pay | Admitting: Internal Medicine

## 2022-09-15 ENCOUNTER — Other Ambulatory Visit: Payer: Self-pay

## 2022-09-15 DIAGNOSIS — I10 Essential (primary) hypertension: Secondary | ICD-10-CM

## 2022-09-17 ENCOUNTER — Other Ambulatory Visit: Payer: Self-pay

## 2022-09-17 MED ORDER — OLMESARTAN MEDOXOMIL 40 MG PO TABS
40.0000 mg | ORAL_TABLET | Freq: Every day | ORAL | 0 refills | Status: DC
  Filled 2022-09-17: qty 90, 90d supply, fill #0

## 2022-09-17 MED FILL — Lancets: 100 days supply | Qty: 100 | Fill #0 | Status: AC

## 2022-09-17 MED FILL — Glucose Blood Test Strip: 100 days supply | Qty: 100 | Fill #0 | Status: AC

## 2022-09-17 NOTE — Telephone Encounter (Signed)
Requested Prescriptions  Pending Prescriptions Disp Refills   olmesartan (BENICAR) 40 MG tablet 90 tablet 0    Sig: TAKE ONE TABLET BY MOUTH ONCE EVERY DAY.     Cardiovascular:  Angiotensin Receptor Blockers Failed - 09/15/2022  5:41 PM      Failed - K in normal range and within 180 days    Potassium  Date Value Ref Range Status  06/19/2022 3.2 (L) 3.5 - 5.1 mmol/L Final  09/22/2012 3.9 3.5 - 5.1 mmol/L Final         Failed - Last BP in normal range    BP Readings from Last 1 Encounters:  06/19/22 (!) 144/92         Passed - Cr in normal range and within 180 days    Creat  Date Value Ref Range Status  03/13/2018 0.91 0.60 - 1.35 mg/dL Final   Creatinine, Ser  Date Value Ref Range Status  06/19/2022 1.01 0.61 - 1.24 mg/dL Final         Passed - Patient is not pregnant      Passed - Valid encounter within last 6 months    Recent Outpatient Visits           3 months ago Type 2 diabetes mellitus with hyperglycemia, without long-term current use of insulin [facility])   [facility] [place], Coralie Keens, NP   10 months ago Type 2 diabetes mellitus with hyperglycemia, without long-term current use of insulin [facility])   [facility] [place], [place] W, NP   1 year ago Type 2 diabetes mellitus with hyperglycemia, without long-term current use of insulin [facility])   [facility] [place], [place] W, NP   1 year ago Type 2 diabetes mellitus with hyperglycemia, without long-term current use of insulin [facility])   [facility] [place], Coralie Keens, NP   2 years ago Primary hypertension   [facility] Kathrine Haddock, [place]

## 2022-09-17 NOTE — Telephone Encounter (Signed)
Requested Prescriptions  Pending Prescriptions Disp Refills   RIGHTEST GS550 BLOOD GLUCOSE test strip [Pharmacy Med Name: glucose blood (RIGHTEST GS550 BLOOD GLUCOSE) test strip] 100 each 0    Sig: Use to check blood sugar daily as directed     Endocrinology: Diabetes - Testing Supplies Passed - 09/15/2022  5:41 PM      Passed - Valid encounter within last 12 months    Recent Outpatient Visits           3 months ago Type 2 diabetes mellitus with hyperglycemia, without long-term current use of insulin [facility])   [facility] [place], [place] W, NP   10 months ago Type 2 diabetes mellitus with hyperglycemia, without long-term current use of insulin [facility])   [facility] [place], [place] W, NP   1 year ago Type 2 diabetes mellitus with hyperglycemia, without long-term current use of insulin [facility])   [facility] [place], [place] W, NP   1 year ago Type 2 diabetes mellitus with hyperglycemia, without long-term current use of insulin [facility])   [facility] [place], Coralie Keens, NP   2 years ago Primary hypertension   [place], NP               Rightest GL300 Lancets [place] [Pharmacy Med Name: Rightest GL300 Lancets Misc] 100 each 0    Sig: Use to check blood sugar daily as directed     Endocrinology: Diabetes - Testing Supplies Passed - 09/15/2022  5:41 PM      Passed - Valid encounter within last 12 months    Recent Outpatient Visits           3 months ago Type 2 diabetes mellitus with hyperglycemia, without long-term current use of insulin [facility])   [facility] [place], [place] W, NP   10 months ago Type 2 diabetes mellitus with hyperglycemia, without long-term current use of insulin [facility])   [facility] [place], [place] W, NP   1 year ago Type 2 diabetes mellitus with hyperglycemia, without long-term  current use of insulin [facility])   [place], [place] W, NP   1 year ago Type 2 diabetes mellitus with hyperglycemia, without long-term current use of insulin [facility])   [facility] [place], Coralie Keens, NP   2 years ago Primary hypertension   [facility] Kathrine Haddock, [place]

## 2022-09-21 ENCOUNTER — Other Ambulatory Visit: Payer: Self-pay

## 2022-09-23 ENCOUNTER — Other Ambulatory Visit: Payer: Self-pay

## 2022-09-23 MED FILL — Metformin HCl Tab 1000 MG: ORAL | 90 days supply | Qty: 180 | Fill #1 | Status: CN

## 2022-09-26 ENCOUNTER — Other Ambulatory Visit: Payer: Self-pay

## 2022-09-26 MED FILL — Metformin HCl Tab 1000 MG: ORAL | 90 days supply | Qty: 180 | Fill #1 | Status: AC

## 2022-09-26 MED FILL — Metformin HCl Tab 1000 MG: ORAL | 90 days supply | Qty: 180 | Fill #1 | Status: CN

## 2022-09-29 ENCOUNTER — Other Ambulatory Visit: Payer: Self-pay

## 2022-09-29 MED FILL — Montelukast Sodium Tab 10 MG (Base Equiv): ORAL | 90 days supply | Qty: 90 | Fill #1 | Status: CN

## 2022-09-30 ENCOUNTER — Other Ambulatory Visit: Payer: Self-pay

## 2022-09-30 MED FILL — Montelukast Sodium Tab 10 MG (Base Equiv): ORAL | 90 days supply | Qty: 90 | Fill #1 | Status: AC

## 2022-10-01 ENCOUNTER — Other Ambulatory Visit: Payer: Self-pay

## 2022-10-16 ENCOUNTER — Other Ambulatory Visit: Payer: Self-pay

## 2022-11-05 ENCOUNTER — Other Ambulatory Visit: Payer: Self-pay | Admitting: Internal Medicine

## 2022-11-05 DIAGNOSIS — I10 Essential (primary) hypertension: Secondary | ICD-10-CM

## 2022-11-06 ENCOUNTER — Other Ambulatory Visit: Payer: Self-pay

## 2022-11-06 MED ORDER — CHLORTHALIDONE 25 MG PO TABS
12.5000 mg | ORAL_TABLET | Freq: Every day | ORAL | 0 refills | Status: DC
  Filled 2022-11-06: qty 45, 90d supply, fill #0

## 2022-11-06 NOTE — Telephone Encounter (Signed)
Requested Prescriptions  Pending Prescriptions Disp Refills   chlorthalidone (HYGROTON) 25 MG tablet 45 tablet 0    Sig: Take 0.5 tablets (12.5 mg total) by mouth daily.     Cardiovascular: Diuretics - Thiazide Failed - 11/05/2022 12:14 PM      Failed - K in normal range and within 180 days    Potassium  Date Value Ref Range Status  06/19/2022 3.2 (L) 3.5 - 5.1 mmol/L Final  09/22/2012 3.9 3.5 - 5.1 mmol/L Final         Failed - Last BP in normal range    BP Readings from Last 1 Encounters:  06/19/22 (!) 144/92         Passed - Cr in normal range and within 180 days    Creat  Date Value Ref Range Status  03/13/2018 0.91 0.60 - 1.35 mg/dL Final   Creatinine, Ser  Date Value Ref Range Status  06/19/2022 1.01 0.61 - 1.24 mg/dL Final         Passed - Na in normal range and within 180 days    Sodium  Date Value Ref Range Status  06/19/2022 137 135 - 145 mmol/L Final  01/24/2017 143 134 - 144 mmol/L Final  09/22/2012 139 136 - 145 mmol/L Final         Passed - Valid encounter within last 6 months    Recent Outpatient Visits           4 months ago Type 2 diabetes mellitus with hyperglycemia, without long-term current use of insulin [facility])   [facility] [place], [place], NP   12 months ago Type 2 diabetes mellitus with hyperglycemia, without long-term current use of insulin [facility])   [facility] [place], [place], NP   1 year ago Type 2 diabetes mellitus with hyperglycemia, without long-term current use of insulin [facility])   [facility] [place], [place] W, NP   1 year ago Type 2 diabetes mellitus with hyperglycemia, without long-term current use of insulin [facility])    [facility] [place], Salvadore [place], NP   2 years ago Primary hypertension    [facility] Cirri, NP               Patient will need an office visit for further refills.

## 2022-11-13 MED FILL — Levothyroxine Sodium Tab 100 MCG: ORAL | 90 days supply | Qty: 90 | Fill #1 | Status: AC

## 2022-11-14 ENCOUNTER — Other Ambulatory Visit: Payer: Self-pay

## 2022-11-23 ENCOUNTER — Other Ambulatory Visit: Payer: Self-pay | Admitting: Internal Medicine

## 2022-11-23 ENCOUNTER — Other Ambulatory Visit: Payer: Self-pay

## 2022-11-23 MED ORDER — ATORVASTATIN CALCIUM 80 MG PO TABS
80.0000 mg | ORAL_TABLET | Freq: Every day | ORAL | 0 refills | Status: DC
  Filled 2022-11-23: qty 90, 90d supply, fill #0

## 2022-11-23 NOTE — Telephone Encounter (Signed)
Requested Prescriptions  Pending Prescriptions Disp Refills   atorvastatin (LIPITOR) 80 MG tablet 90 tablet 0    Sig: Take 1 tablet (80 mg total) by mouth once daily.     Cardiovascular:  Antilipid - Statins Failed - 11/23/2022  1:12 PM      Failed - Lipid Panel in normal range within the last 12 months    Cholesterol  Date Value Ref Range Status  06/19/2022 114 0 - 200 mg/dL Final   LDL Cholesterol (Calc)  Date Value Ref Range Status  03/13/2018 65 mg/dL (calc) Final    Comment:    Reference range: <100 . Desirable range <100 mg/dL for primary prevention;   <70 mg/dL for patients with CHD or diabetic patients  with > or = 2 CHD risk factors. Marland Kitchen LDL-C is now calculated using the Martin-Hopkins  calculation, which is a validated novel method providing  better accuracy than the Friedewald equation in the  estimation of LDL-C.  Horald Pollen et al. Lenox Ahr. 1610;960(45): 2061-2068  (http://education.[employer].com/faq/FAQ164)    LDL Cholesterol  Date Value Ref Range Status  06/19/2022 57 0 - 99 mg/dL Final    Comment:           Total Cholesterol/HDL:CHD Risk Coronary Heart Disease Risk Table                     Men   Women  1/2 Average Risk   3.4   3.3  Average Risk       5.0   4.4  2 X Average Risk   9.6   7.1  3 X Average Risk  23.4   11.0        Use the calculated Patient Ratio above and the CHD Risk Table to determine the patient's CHD Risk.        ATP III CLASSIFICATION (LDL):  <100     mg/dL   Optimal  409-811  mg/dL   Near or Above                    Optimal  130-159  mg/dL   Borderline  914-782  mg/dL   High  >956     mg/dL   Very High Performed at [facility], [address] [place], [place] [address]    HDL  Date Value Ref Range Status  06/19/2022 30 (L) >40 mg/dL Final   Triglycerides  Date Value Ref Range Status  06/19/2022 135 <150 mg/dL Final         Passed - Patient is not pregnant      Passed - Valid encounter within last 12  months    Recent Outpatient Visits           5 months ago Type 2 diabetes mellitus with hyperglycemia, without long-term current use of insulin [facility])   [facility] [place], [place] W, NP   1 year ago Type 2 diabetes mellitus with hyperglycemia, without long-term current use of insulin [facility])   [facility] [place], [place] W, NP   1 year ago Type 2 diabetes mellitus with hyperglycemia, without long-term current use of insulin [facility])   [facility] [place], [place] W, NP   1 year ago Type 2 diabetes mellitus with hyperglycemia, without long-term current use of insulin [facility])   [place] [facility] [place], Salvadore Oxford, NP   2 years ago Primary hypertension   [facility] Gabriel Cirri, [place]

## 2022-11-25 ENCOUNTER — Other Ambulatory Visit: Payer: Self-pay | Admitting: Internal Medicine

## 2022-11-25 ENCOUNTER — Other Ambulatory Visit: Payer: Self-pay

## 2022-11-25 DIAGNOSIS — I10 Essential (primary) hypertension: Secondary | ICD-10-CM

## 2022-11-26 ENCOUNTER — Other Ambulatory Visit: Payer: Self-pay

## 2022-11-26 MED FILL — Carvedilol Tab 25 MG: ORAL | 90 days supply | Qty: 180 | Fill #0 | Status: CN

## 2022-11-26 MED FILL — Carvedilol Tab 25 MG: ORAL | 90 days supply | Qty: 180 | Fill #0 | Status: AC

## 2022-11-26 NOTE — Telephone Encounter (Signed)
Requested Prescriptions  Pending Prescriptions Disp Refills   carvedilol (COREG) 25 MG tablet 180 tablet 0    Sig: Take 1 tablet (25 mg total) by mouth 2 (two) times daily with a meal.     Cardiovascular: Beta Blockers 3 Failed - 11/25/2022  8:57 PM      Failed - Last BP in normal range    BP Readings from Last 1 Encounters:  06/19/22 (!) 144/92         Passed - Cr in normal range and within 360 days    Creat  Date Value Ref Range Status  03/13/2018 0.91 0.60 - 1.35 mg/dL Final   Creatinine, Ser  Date Value Ref Range Status  06/19/2022 1.01 0.61 - 1.24 mg/dL Final         Passed - AST in normal range and within 360 days    AST  Date Value Ref Range Status  06/19/2022 26 15 - 41 U/L Final   SGOT(AST)  Date Value Ref Range Status  09/22/2012 28 15 - 37 Unit/L Final         Passed - ALT in normal range and within 360 days    ALT  Date Value Ref Range Status  06/19/2022 26 0 - 44 U/L Final   SGPT (ALT)  Date Value Ref Range Status  09/22/2012 31 12 - 78 U/L Final         Passed - Last Heart Rate in normal range    Pulse Readings from Last 1 Encounters:  06/19/22 84         Passed - Valid encounter within last 6 months    Recent Outpatient Visits           5 months ago Type 2 diabetes mellitus with hyperglycemia, without long-term current use of insulin [facility])   [facility] [place], [place], NP   1 year ago Type 2 diabetes mellitus with hyperglycemia, without long-term current use of insulin [facility])   [place] [facility] [place], [place], NP   1 year ago Type 2 diabetes mellitus with hyperglycemia, without long-term current use of insulin [facility])   [facility] [place], [place], NP   1 year ago Type 2 diabetes mellitus with hyperglycemia, without long-term current use of insulin [facility])   [place] [place], Salvadore [place], NP   2 years ago Primary hypertension   [place]  [facility] Gabriel Cirri, [place]

## 2022-11-28 ENCOUNTER — Other Ambulatory Visit: Payer: Self-pay

## 2022-11-28 IMAGING — CT CT ABD-PELV W/ CM
2 of 5 series · 14 of 46 positions shown, 16 images · IV contrast (omnipaque)
Comparison: CT chest dated 07/28/2020

CLINICAL DATA: Follow-up renal cell carcinoma, status post left
nephrectomy, recent COVID

EXAM:
CT CHEST, ABDOMEN, AND PELVIS WITH CONTRAST
TECHNIQUE: Multidetector CT imaging of the chest, abdomen and pelvis was
performed following the standard protocol during bolus
administration of intravenous contrast.
CONTRAST:  100mL OMNIPAQUE IOHEXOL 300 MG/ML  SOLN

[Series 2: axials cap 5.00 · axial · 0.95mm/px · z∈[-1594,-964]mm · 11 of 150 slices shown, 13 images]
[im 12/150  soft-tissue]
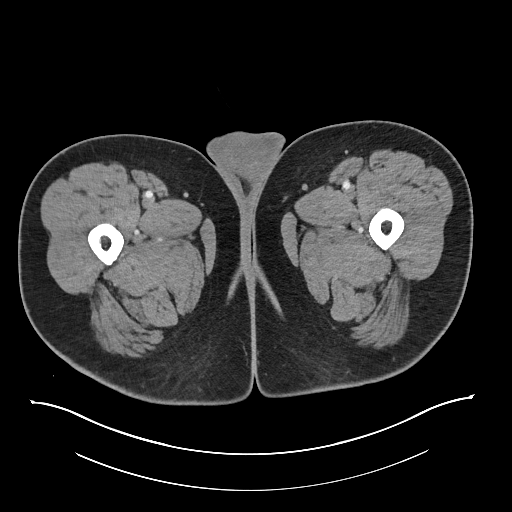
[im 12/150  bone]
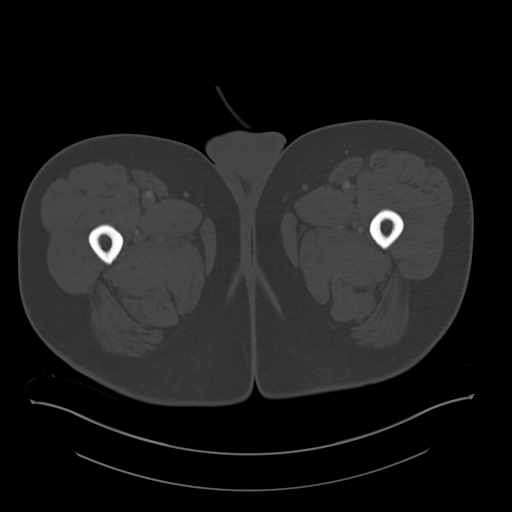
[im 23/150  soft-tissue]
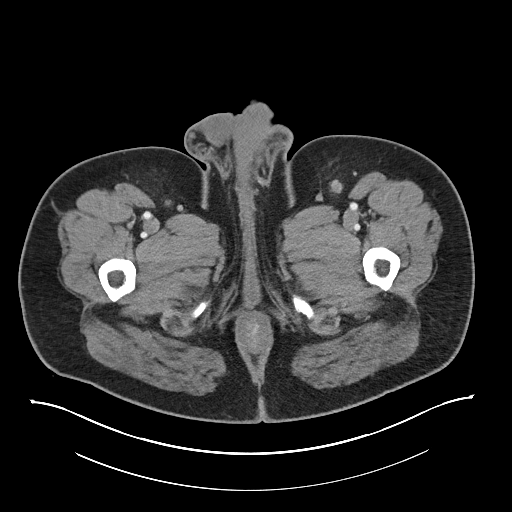
[im 35/150  soft-tissue]
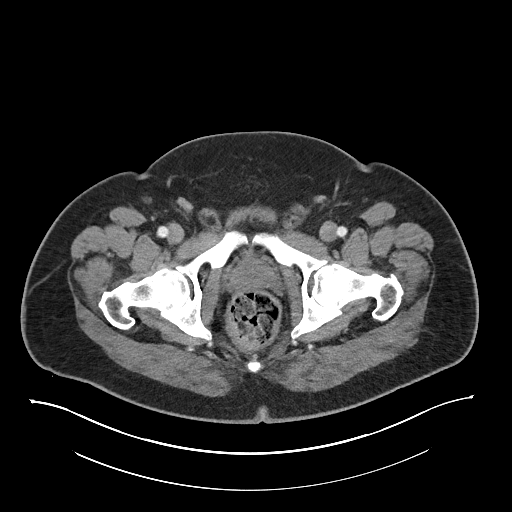
[im 46/150  soft-tissue]
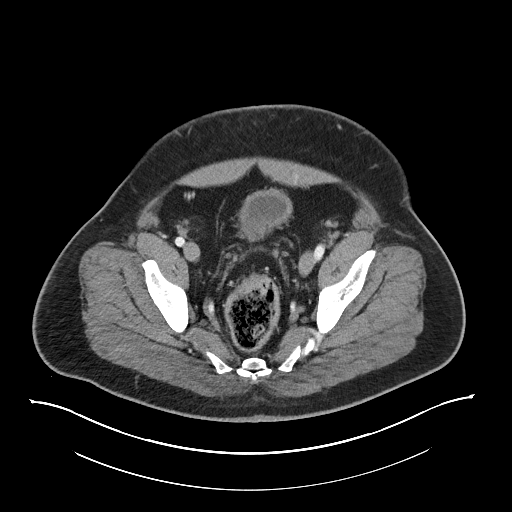
[im 58/150  soft-tissue]
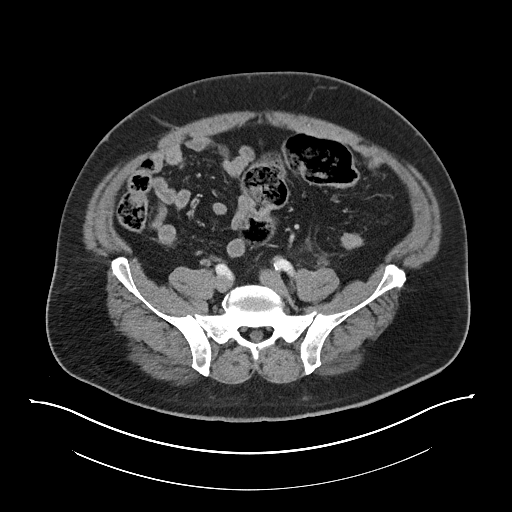
[im 81/150  soft-tissue]
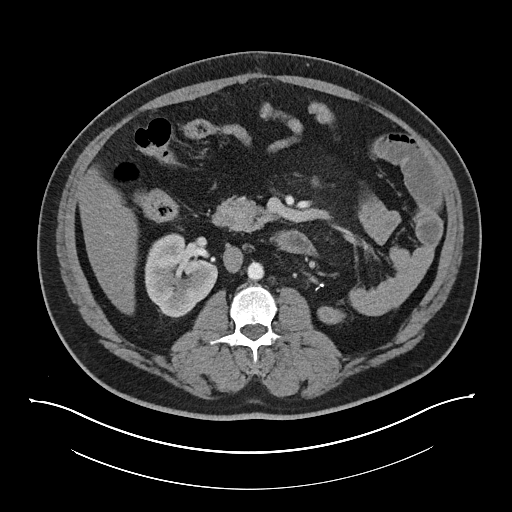
[im 92/150  soft-tissue]
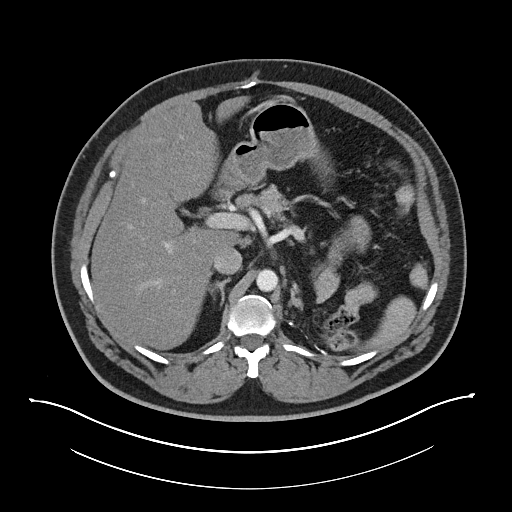
[im 104/150  soft-tissue]
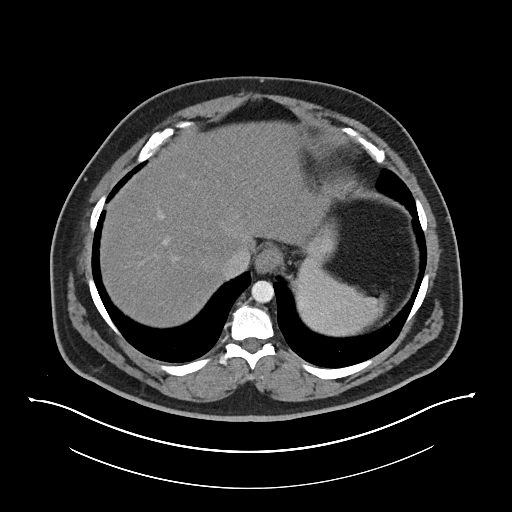
[im 115/150  soft-tissue]
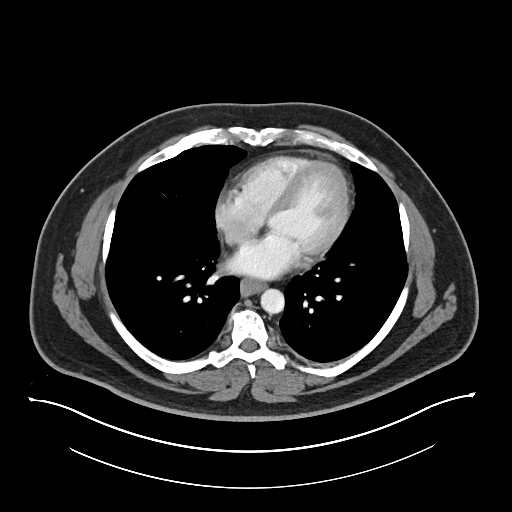
[im 115/150  bone]
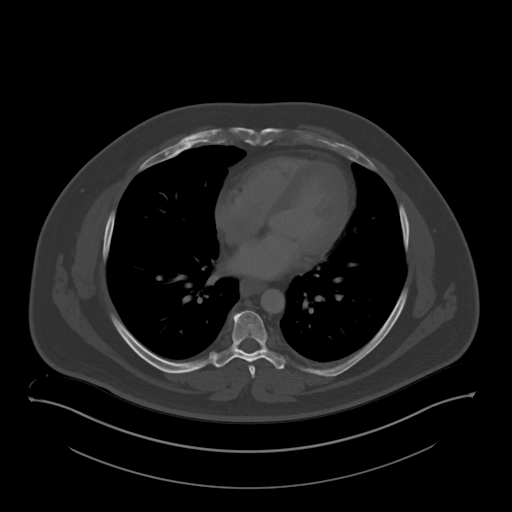
[im 127/150  soft-tissue]
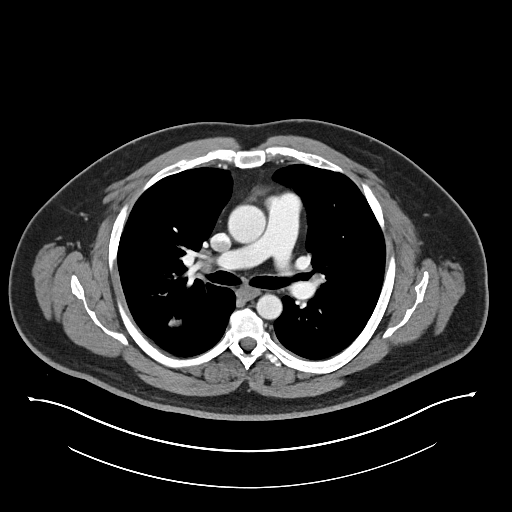
[im 138/150  soft-tissue]
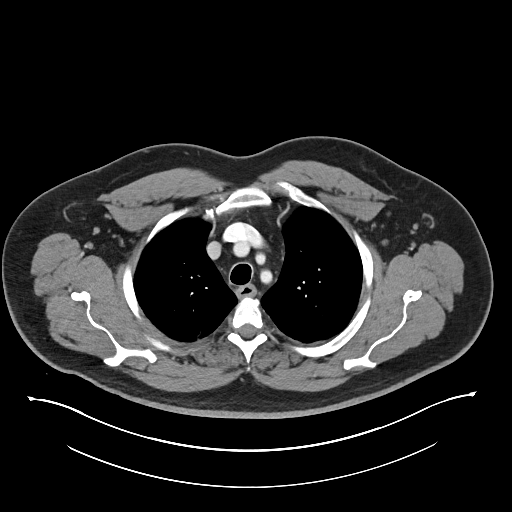

[Series 4: coronals cap 2.00 cor · coronal · 0.92mm/px · 3 of 179 slices shown]
[im 60/179  soft-tissue]
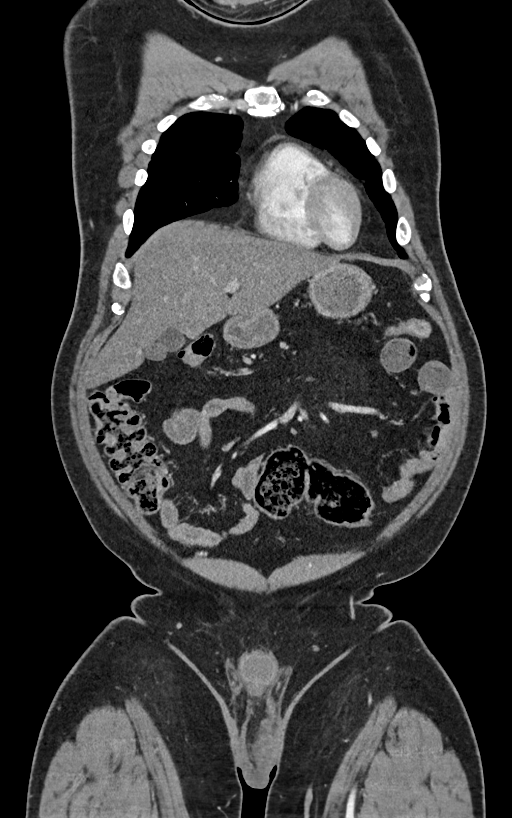
[im 80/179  soft-tissue]
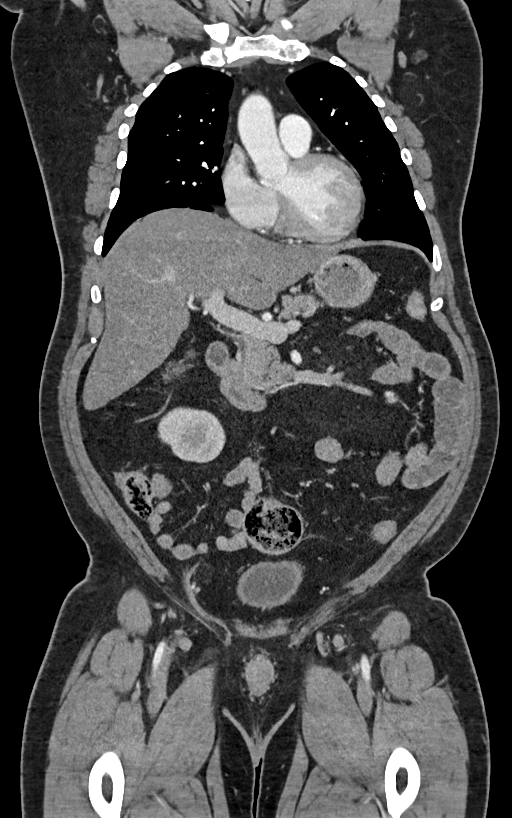
[im 99/179  soft-tissue]
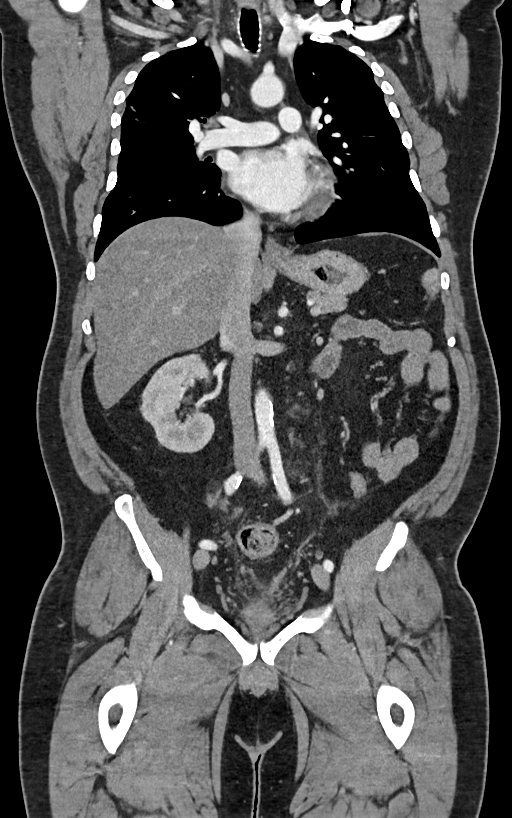

[14 of 46 positions shown; findings below may reference images not displayed]

FINDINGS: CT CHEST FINDINGS

Cardiovascular: The heart is normal in size. No pericardial
effusion.

No evidence of thoracic aortic aneurysm. Atherosclerotic
calcifications of the aortic arch.

Three vessel coronary atherosclerosis.

Mediastinum/Nodes: No suspicious mediastinal lymphadenopathy.

Visualized thyroid is unremarkable.

Lungs/Pleura: Mild subpleural patchy opacities in the bilateral
upper lobes, right middle lobe, and right lower lobe, new. This
appearance is compatible with the patient's known COVID pneumonia.

No suspicious pulmonary nodules.

No pleural effusion or pneumothorax.

Musculoskeletal: Mild degenerative changes of the visualized
thoracic spine.

CT ABDOMEN PELVIS FINDINGS

Hepatobiliary: Hepatic steatosis with focal fatty sparing along the
gallbladder fossa.

Gallbladder is unremarkable. No intrahepatic or extrahepatic ductal
dilatation.

Pancreas: Within normal limits.

Spleen: Within normal limits.

Adrenals/Urinary Tract: Adrenal glands are with normal limits.

Status post left nephrectomy. No abnormal soft tissue in the
surgical bed.

Right kidney is within normal limits.  No hydronephrosis.

Mildly thick-walled bladder, although underdistended.

Stomach/Bowel: Stomach is within normal limits.

No evidence of bowel obstruction.

Appendix is not discretely visualized.

No colonic wall thickening or inflammatory changes.

Vascular/Lymphatic: No evidence of abdominal aortic aneurysm.

Atherosclerotic calcifications of the abdominal aorta and branch
vessels.

No suspicious abdominopelvic lymphadenopathy.

Reproductive: Prostate is unremarkable.

Other: No abdominopelvic ascites.

Musculoskeletal: Mild degenerative changes of the lumbar spine.
IMPRESSION: Multifocal pneumonia in this patient with known COVID.

Otherwise, no interval change from recent CT.

Status post left nephrectomy. No evidence of recurrent or metastatic
disease.

## 2022-12-09 ENCOUNTER — Other Ambulatory Visit: Payer: Self-pay | Admitting: Internal Medicine

## 2022-12-09 DIAGNOSIS — I10 Essential (primary) hypertension: Secondary | ICD-10-CM

## 2022-12-10 ENCOUNTER — Other Ambulatory Visit: Payer: Self-pay | Admitting: Internal Medicine

## 2022-12-10 ENCOUNTER — Other Ambulatory Visit: Payer: Self-pay

## 2022-12-10 DIAGNOSIS — I10 Essential (primary) hypertension: Secondary | ICD-10-CM

## 2022-12-11 ENCOUNTER — Other Ambulatory Visit: Payer: Self-pay

## 2022-12-12 ENCOUNTER — Other Ambulatory Visit: Payer: Self-pay

## 2022-12-12 MED FILL — Olmesartan Medoxomil Tab 40 MG: ORAL | 90 days supply | Qty: 90 | Fill #0 | Status: AC

## 2022-12-12 NOTE — Telephone Encounter (Signed)
Requested Prescriptions  Pending Prescriptions Disp Refills   olmesartan (BENICAR) 40 MG tablet 90 tablet 0    Sig: Take 1 tablet (40 mg total) by mouth daily.     Cardiovascular:  Angiotensin Receptor Blockers Failed - 12/10/2022 10:13 PM      Failed - K in normal range and within 180 days    Potassium  Date Value Ref Range Status  06/19/2022 3.2 (L) 3.5 - 5.1 mmol/L Final  09/22/2012 3.9 3.5 - 5.1 mmol/L Final         Failed - Last BP in normal range    BP Readings from Last 1 Encounters:  06/19/22 (!) 144/92         Passed - Cr in normal range and within 180 days    Creat  Date Value Ref Range Status  03/13/2018 0.91 0.60 - 1.35 mg/dL Final   Creatinine, Ser  Date Value Ref Range Status  06/19/2022 1.01 0.61 - 1.24 mg/dL Final         Passed - Patient is not pregnant      Passed - Valid encounter within last 6 months    Recent Outpatient Visits           5 months ago Type 2 diabetes mellitus with hyperglycemia, without long-term current use of insulin [facility])   [place] [place], [place], NP   1 year ago Type 2 diabetes mellitus with hyperglycemia, without long-term current use of insulin [facility])   [place] [facility] [place], [place] W, NP   1 year ago Type 2 diabetes mellitus with hyperglycemia, without long-term current use of insulin [facility])   [place] [place], [place] W, NP   1 year ago Type 2 diabetes mellitus with hyperglycemia, without long-term current use of insulin [facility])   [place] [facility] [place], Salvadore Oxford, NP   2 years ago Primary hypertension    [facility] Gabriel Cirri, [place]

## 2022-12-20 ENCOUNTER — Other Ambulatory Visit: Payer: Self-pay | Admitting: Internal Medicine

## 2022-12-21 ENCOUNTER — Other Ambulatory Visit: Payer: Self-pay

## 2022-12-21 MED ORDER — METFORMIN HCL 1000 MG PO TABS
1000.0000 mg | ORAL_TABLET | Freq: Two times a day (BID) | ORAL | 0 refills | Status: DC
  Filled 2022-12-21: qty 60, 30d supply, fill #0

## 2022-12-21 NOTE — Telephone Encounter (Signed)
Courtesy refill. Patient will need an office visit for further refills. Requested Prescriptions  Pending Prescriptions Disp Refills   metFORMIN (GLUCOPHAGE) 1000 MG tablet 60 tablet 0    Sig: Take 1 tablet (1,000 mg total) by mouth 2 (two) times daily with a meal.     Endocrinology:  Diabetes - Biguanides Failed - 12/20/2022  7:17 PM      Failed - HBA1C is between 0 and 7.9 and within 180 days    Hemoglobin A1C  Date Value Ref Range Status  06/19/2022 6.2 (A) 4.0 - 5.6 % Final   Hgb A1c MFr Bld  Date Value Ref Range Status  11/21/2021 7.3 (H) 4.8 - 5.6 % Final    Comment:    (NOTE) Pre diabetes:          5.7%-6.4%  Diabetes:              >6.4%  Glycemic control for   <7.0% adults with diabetes          Failed - B12 Level in normal range and within 720 days    No results found for: "VITAMINB12"       Failed - Valid encounter within last 6 months    Recent Outpatient Visits           6 months ago Type 2 diabetes mellitus with hyperglycemia, without long-term current use of insulin (HCC)   [facility] [place], [place], NP   1 year ago Type 2 diabetes mellitus with hyperglycemia, without long-term current use of insulin (HCC)   [place] [facility] [place], [place] W, NP   1 year ago Type 2 diabetes mellitus with hyperglycemia, without long-term current use of insulin (HCC)   [place] [facility] [place], [place] W, NP   2 years ago Type 2 diabetes mellitus with hyperglycemia, without long-term current use of insulin (HCC)   [place] [facility] [place], [place] W, NP   2 years ago Primary hypertension    [facility] [place], Elnita Maxwell, NP              Failed - CBC within normal limits and completed in the last 12 months    WBC  Date Value Ref Range Status  11/21/2021 7.1 4.0 - 10.5 K/uL Final   RBC  Date Value Ref Range Status  11/21/2021 4.81 4.22 - 5.81 MIL/uL Final    Hemoglobin  Date Value Ref Range Status  11/21/2021 11.8 (L) 13.0 - 17.0 g/dL Final   HGB  Date Value Ref Range Status  09/22/2012 12.5 (L) 13.0 - 18.0 g/dL Final   HCT  Date Value Ref Range Status  11/21/2021 37.5 (L) 39.0 - 52.0 % Final  09/22/2012 38.1 (L) 40.0 - 52.0 % Final   MCHC  Date Value Ref Range Status  11/21/2021 31.5 30.0 - 36.0 g/dL Final   [facility]  Date Value Ref Range Status  11/21/2021 24.5 (L) 26.0 - 34.0 pg Final   MCV  Date Value Ref Range Status  11/21/2021 78.0 (L) 80.0 - 100.0 fL Final  09/22/2012 81 80 - 100 fL Final   No results found for: "PLTCOUNTKUC", "LABPLAT", "POCPLA" RDW  Date Value Ref Range Status  11/21/2021 14.8 11.5 - 15.5 % Final  09/22/2012 15.0 (H) 11.5 - 14.5 % Final         Passed - Cr in normal range and within 360 days  Creat  Date Value Ref Range Status  03/13/2018 0.91 0.60 - 1.35 mg/dL Final   Creatinine, Ser  Date Value Ref Range Status  06/19/2022 1.01 0.61 - 1.24 mg/dL Final         Passed - eGFR in normal range and within 360 days    GFR, Est African American  Date Value Ref Range Status  03/13/2018 116 > OR = 60 mL/min/1.41m2 Final   GFR calc Af Amer  Date Value Ref Range Status  04/15/2020 >60 >60 mL/min Final   GFR, Est Non African American  Date Value Ref Range Status  03/13/2018 100 > OR = 60 mL/min/1.25m2 Final   GFR, Estimated  Date Value Ref Range Status  06/19/2022 >60 >60 mL/min Final    Comment:    (NOTE) Calculated using the CKD-EPI Creatinine Equation (2021)

## 2022-12-21 NOTE — Telephone Encounter (Signed)
Called pt - left message with person answering phone.

## 2022-12-27 ENCOUNTER — Other Ambulatory Visit: Payer: Self-pay | Admitting: Internal Medicine

## 2022-12-27 ENCOUNTER — Other Ambulatory Visit: Payer: Self-pay

## 2022-12-27 DIAGNOSIS — I10 Essential (primary) hypertension: Secondary | ICD-10-CM

## 2022-12-27 MED FILL — Montelukast Sodium Tab 10 MG (Base Equiv): ORAL | 90 days supply | Qty: 90 | Fill #2 | Status: AC

## 2022-12-28 ENCOUNTER — Other Ambulatory Visit: Payer: Self-pay

## 2022-12-28 MED FILL — Potassium Chloride Microencapsulated Crys ER Tab 20 mEq: ORAL | 90 days supply | Qty: 180 | Fill #0 | Status: AC

## 2022-12-28 MED FILL — Potassium Chloride Microencapsulated Crys ER Tab 20 mEq: ORAL | 90 days supply | Qty: 180 | Fill #0 | Status: CN

## 2022-12-28 NOTE — Telephone Encounter (Signed)
Requested Prescriptions  Pending Prescriptions Disp Refills   potassium chloride SA (KLOR-CON M) 20 MEQ tablet 180 tablet 0    Sig: Take 1 tablet (20 mEq total) by mouth 2 (two) times daily.     Endocrinology:  Minerals - Potassium Supplementation Failed - 12/27/2022  5:26 PM      Failed - K in normal range and within 360 days    Potassium  Date Value Ref Range Status  06/19/2022 3.2 (L) 3.5 - 5.1 mmol/L Final  09/22/2012 3.9 3.5 - 5.1 mmol/L Final         Passed - Cr in normal range and within 360 days    Creat  Date Value Ref Range Status  03/13/2018 0.91 0.60 - 1.35 mg/dL Final   Creatinine, Ser  Date Value Ref Range Status  06/19/2022 1.01 0.61 - 1.24 mg/dL Final         Passed - Valid encounter within last 12 months    Recent Outpatient Visits           6 months ago Type 2 diabetes mellitus with hyperglycemia, without long-term current use of insulin [facility])   [place] [facility] [place], [place], NP   1 year ago Type 2 diabetes mellitus with hyperglycemia, without long-term current use of insulin [facility])   [place] [facility] [place], [place] W, NP   1 year ago Type 2 diabetes mellitus with hyperglycemia, without long-term current use of insulin [facility])   [place] [facility] [place], [place] W, NP   2 years ago Type 2 diabetes mellitus with hyperglycemia, without long-term current use of insulin [facility]   [place] [place], Salvadore Oxford, NP   2 years ago Primary hypertension   [place] [facility] [place], [place]

## 2023-01-13 ENCOUNTER — Other Ambulatory Visit: Payer: Self-pay | Admitting: Internal Medicine

## 2023-01-14 ENCOUNTER — Other Ambulatory Visit: Payer: Self-pay | Admitting: Internal Medicine

## 2023-01-14 DIAGNOSIS — I10 Essential (primary) hypertension: Secondary | ICD-10-CM

## 2023-01-15 ENCOUNTER — Other Ambulatory Visit: Payer: Self-pay | Admitting: Internal Medicine

## 2023-01-15 ENCOUNTER — Other Ambulatory Visit: Payer: Self-pay

## 2023-01-15 DIAGNOSIS — I10 Essential (primary) hypertension: Secondary | ICD-10-CM

## 2023-01-16 ENCOUNTER — Other Ambulatory Visit: Payer: Self-pay

## 2023-01-16 MED FILL — Amlodipine Besylate Tab 10 MG (Base Equivalent): ORAL | 90 days supply | Qty: 90 | Fill #0 | Status: AC

## 2023-01-16 MED FILL — Fluticasone Propionate Nasal Susp 50 MCG/ACT: NASAL | 30 days supply | Qty: 16 | Fill #0 | Status: AC

## 2023-01-16 NOTE — Telephone Encounter (Signed)
Requested Prescriptions  Pending Prescriptions Disp Refills   amLODipine (NORVASC) 10 MG tablet 90 tablet 0    Sig: TAKE ONE TABLET BY MOUTH ONCE EVERY DAY.     Cardiovascular: Calcium Channel Blockers 2 Failed - 01/15/2023  8:52 AM      Failed - Last BP in normal range    BP Readings from Last 1 Encounters:  06/19/22 (!) 144/92         Failed - Valid encounter within last 6 months    Recent Outpatient Visits           7 months ago Type 2 diabetes mellitus with hyperglycemia, without long-term current use of insulin [facility])   [place] [facility] [place], [place] W, NP   1 year ago Type 2 diabetes mellitus with hyperglycemia, without long-term current use of insulin [facility])   [facility] [place], [place], NP   1 year ago Type 2 diabetes mellitus with hyperglycemia, without long-term current use of insulin [facility])   [place] [facility] [place], [place], NP   2 years ago Type 2 diabetes mellitus with hyperglycemia, without long-term current use of insulin [facility])   [place] [place], [place], NP   2 years ago Primary hypertension   [place] [facility] [place], [place], NP              Passed - Last Heart Rate in normal range    Pulse Readings from Last 1 Encounters:  06/19/22 84          fluticasone (FLONASE) 50 MCG/ACT nasal spray 48 g 0    Sig: Place 2 sprays into both nostrils daily.     Ear, Nose, and Throat: Nasal Preparations - Corticosteroids Passed - 01/15/2023  8:52 AM      Passed - Valid encounter within last 12 months    Recent Outpatient Visits           7 months ago Type 2 diabetes mellitus with hyperglycemia, without long-term current use of insulin [facility])   [place] [facility] [place], [place], NP   1 year ago Type 2 diabetes mellitus with hyperglycemia, without long-term current use of insulin [facility])   [place] [facility]  [place], [place], NP   1 year ago Type 2 diabetes mellitus with hyperglycemia, without long-term current use of insulin [facility])   [place] [facility] [place], [place], NP   2 years ago Type 2 diabetes mellitus with hyperglycemia, without long-term current use of insulin [facility])   [place] [facility] [place], Salvadore Oxford, NP   2 years ago Primary hypertension   [place] [facility] Gabriel Cir[place], [place]

## 2023-01-17 ENCOUNTER — Other Ambulatory Visit: Payer: Self-pay

## 2023-01-27 ENCOUNTER — Other Ambulatory Visit: Payer: Self-pay | Admitting: Internal Medicine

## 2023-01-28 ENCOUNTER — Other Ambulatory Visit: Payer: Self-pay

## 2023-01-28 NOTE — Telephone Encounter (Signed)
Called patient to schedule an office visit follow-up appointment, patient refused the appointment at this time stating he does not have the money for an office visit right now and will call back when he can afford it.

## 2023-01-28 NOTE — Telephone Encounter (Signed)
Requested medication (s) are due for refill today: Yes/  Requested medication (s) are on the active medication list: Yes  Last refill:  12/21/22 #60 0RF  Future visit scheduled: No  Notes to clinic:  Unable to refill per protocol, courtesy refill already given, routing for provider approval.      Requested Prescriptions  Pending Prescriptions Disp Refills   metFORMIN (GLUCOPHAGE) 1000 MG tablet 60 tablet 0    Sig: Take 1 tablet (1,000 mg total) by mouth 2 (two) times daily with a meal.     Endocrinology:  Diabetes - Biguanides Failed - 01/27/2023  8:58 PM      Failed - HBA1C is between 0 and 7.9 and within 180 days    Hemoglobin A1C  Date Value Ref Range Status  06/19/2022 6.2 (A) 4.0 - 5.6 % Final   Hgb A1c MFr Bld  Date Value Ref Range Status  11/21/2021 7.3 (H) 4.8 - 5.6 % Final    Comment:    (NOTE) Pre diabetes:          5.7%-6.4%  Diabetes:              >6.4%  Glycemic control for   <7.0% adults with diabetes          Failed - B12 Level in normal range and within 720 days    No results found for: "VITAMINB12"       Failed - Valid encounter within last 6 months    Recent Outpatient Visits           7 months ago Type 2 diabetes mellitus with hyperglycemia, without long-term current use of insulin (HCC)   [place] [facility] [place], Salvadore Oxford, NP   1 year ago Type 2 diabetes mellitus with hyperglycemia, without long-term current use of insulin (HCC)   [place] [facility] [place], [place], NP   1 year ago Type 2 diabetes mellitus with hyperglycemia, without long-term current use of insulin [facility])   [place] [facility] [place], [place], NP   2 years ago Type 2 diabetes mellitus with hyperglycemia, without long-term current use of insulin (HCC)   [facility] [place], [place] W, NP   2 years ago Primary hypertension   [place] [facility] [place], Elnita Maxwell, NP               Failed - CBC within normal limits and completed in the last 12 months    WBC  Date Value Ref Range Status  11/21/2021 7.1 4.0 - 10.5 K/uL Final   RBC  Date Value Ref Range Status  11/21/2021 4.81 4.22 - 5.81 MIL/uL Final   Hemoglobin  Date Value Ref Range Status  11/21/2021 11.8 (L) 13.0 - 17.0 g/dL Final   HGB  Date Value Ref Range Status  09/22/2012 12.5 (L) 13.0 - 18.0 g/dL Final   HCT  Date Value Ref Range Status  11/21/2021 37.5 (L) 39.0 - 52.0 % Final  09/22/2012 38.1 (L) 40.0 - 52.0 % Final   MCHC  Date Value Ref Range Status  11/21/2021 31.5 30.0 - 36.0 g/dL Final   [facility]  Date Value Ref Range Status  11/21/2021 24.5 (L) 26.0 - 34.0 pg Final   MCV  Date Value Ref Range Status  11/21/2021 78.0 (L) 80.0 - 100.0 fL Final  09/22/2012 81 80 - 100 fL Final   No results found for: "PLTCOUNTKUC", "LABPLAT", "POCPLA" RDW  Date Value Ref Range Status  11/21/2021 14.8 11.5 - 15.5 % Final  09/22/2012 15.0 (H) 11.5 - 14.5 % Final         Passed - Cr in normal range and within 360 days    Creat  Date Value Ref Range Status  03/13/2018 0.91 0.60 - 1.35 mg/dL Final   Creatinine, Ser  Date Value Ref Range Status  06/19/2022 1.01 0.61 - 1.24 mg/dL Final         Passed - eGFR in normal range and within 360 days    GFR, Est African American  Date Value Ref Range Status  03/13/2018 116 > OR = 60 mL/min/1.6m2 Final   GFR calc Af Amer  Date Value Ref Range Status  04/15/2020 >60 >60 mL/min Final   GFR, Est Non African American  Date Value Ref Range Status  03/13/2018 100 > OR = 60 mL/min/1.36m2 Final   GFR, Estimated  Date Value Ref Range Status  06/19/2022 >60 >60 mL/min Final    Comment:    (NOTE) Calculated using the CKD-EPI Creatinine Equation (2021)

## 2023-02-04 ENCOUNTER — Other Ambulatory Visit: Payer: Self-pay | Admitting: Internal Medicine

## 2023-02-04 ENCOUNTER — Other Ambulatory Visit: Payer: Self-pay

## 2023-02-04 DIAGNOSIS — I10 Essential (primary) hypertension: Secondary | ICD-10-CM

## 2023-02-05 ENCOUNTER — Telehealth: Payer: Self-pay | Admitting: Physician Assistant

## 2023-02-05 ENCOUNTER — Other Ambulatory Visit: Payer: Self-pay

## 2023-02-05 DIAGNOSIS — H66009 Acute suppurative otitis media without spontaneous rupture of ear drum, unspecified ear: Secondary | ICD-10-CM

## 2023-02-05 DIAGNOSIS — J019 Acute sinusitis, unspecified: Secondary | ICD-10-CM

## 2023-02-05 DIAGNOSIS — B9789 Other viral agents as the cause of diseases classified elsewhere: Secondary | ICD-10-CM

## 2023-02-05 MED ORDER — AMOXICILLIN-POT CLAVULANATE 875-125 MG PO TABS
1.0000 | ORAL_TABLET | Freq: Two times a day (BID) | ORAL | 0 refills | Status: DC
  Filled 2023-02-05: qty 20, 10d supply, fill #0

## 2023-02-05 NOTE — Progress Notes (Signed)

## 2023-02-05 NOTE — Progress Notes (Signed)
I have spent 5 minutes in review of e-visit questionnaire, review and updating patient chart, medical decision making and response to patient.   William Cody Martin, PA-C    

## 2023-02-05 NOTE — Telephone Encounter (Signed)
Requested medication (s) are due for refill today: Yes  Requested medication (s) are on the active medication list: Yes  Last refill:  11/06/22  Future visit scheduled: Yes  Notes to clinic:  Unable to refill per protocol, appointment needed.      Requested Prescriptions  Pending Prescriptions Disp Refills   chlorthalidone (HYGROTON) 25 MG tablet 45 tablet 0    Sig: Take 0.5 tablets (12.5 mg total) by mouth daily.     Cardiovascular: Diuretics - Thiazide Failed - 02/04/2023 12:02 PM      Failed - Cr in normal range and within 180 days    Creat  Date Value Ref Range Status  03/13/2018 0.91 0.60 - 1.35 mg/dL Final   Creatinine, Ser  Date Value Ref Range Status  06/19/2022 1.01 0.61 - 1.24 mg/dL Final         Failed - K in normal range and within 180 days    Potassium  Date Value Ref Range Status  06/19/2022 3.2 (L) 3.5 - 5.1 mmol/L Final  09/22/2012 3.9 3.5 - 5.1 mmol/L Final         Failed - Na in normal range and within 180 days    Sodium  Date Value Ref Range Status  06/19/2022 137 135 - 145 mmol/L Final  01/24/2017 143 134 - 144 mmol/L Final  09/22/2012 139 136 - 145 mmol/L Final         Failed - Last BP in normal range    BP Readings from Last 1 Encounters:  06/19/22 (!) 144/92         Failed - Valid encounter within last 6 months    Recent Outpatient Visits           7 months ago Type 2 diabetes mellitus with hyperglycemia, without long-term current use of insulin (HCC)   [place] [facility] [place], [place], NP   1 year ago Type 2 diabetes mellitus with hyperglycemia, without long-term current use of insulin (HCC)   [place] [facility] [place], Salv[place], NP   1 year ago Type 2 diabetes mellitus with hyperglycemia, without long-term current use of insulin (HCC)   [place] [facility] [place], [place], NP   2 years ago Type 2 diabetes mellitus with hyperglycemia, without long-term current use of insulin  [facility])   [place] [facility] [place], [place], NP   2 years ago Primary hypertension   [place] [facility] [place], Elnita Maxwell, NP               metFORMIN (GLUCOPHAGE) 1000 MG tablet 60 tablet 0    Sig: Take 1 tablet (1,000 mg total) by mouth 2 (two) times daily with a meal.     Endocrinology:  Diabetes - Biguanides Failed - 02/04/2023 12:02 PM      Failed - HBA1C is between 0 and 7.9 and within 180 days    Hemoglobin A1C  Date Value Ref Range Status  06/19/2022 6.2 (A) 4.0 - 5.6 % Final   Hgb A1c MFr Bld  Date Value Ref Range Status  11/21/2021 7.3 (H) 4.8 - 5.6 % Final    Comment:    (NOTE) Pre diabetes:          5.7%-6.4%  Diabetes:              >6.4%  Glycemic control for   <7.0% adults with diabetes  Failed - B12 Level in normal range and within 720 days    No results found for: "VITAMINB12"       Failed - Valid encounter within last 6 months    Recent Outpatient Visits           7 months ago Type 2 diabetes mellitus with hyperglycemia, without long-term current use of insulin (HCC)   [place] [facility] [place], [place], NP   1 year ago Type 2 diabetes mellitus with hyperglycemia, without long-term current use of insulin [facility] [place] [facility])   [place] [facility] [place], [place], NP   1 year ago Type 2 diabetes mellitus with hyperglycemia, without long-term current use of insulin (HCC)   [place] [facility] [place], [place] W, NP   2 years ago Type 2 diabetes mellitus with hyperglycemia, without long-term current use of insulin (HCC)   [place] [facility] [place], [place] W, NP   2 years ago Primary hypertension   [place] [facility] [place], Elnita Maxwell, NP              Failed - CBC within normal limits and completed in the last 12 months    WBC  Date Value Ref Range Status  11/21/2021 7.1 4.0 - 10.5 K/uL Final   RBC  Date Value Ref  Range Status  11/21/2021 4.81 4.22 - 5.81 MIL/uL Final   Hemoglobin  Date Value Ref Range Status  11/21/2021 11.8 (L) 13.0 - 17.0 g/dL Final   HGB  Date Value Ref Range Status  09/22/2012 12.5 (L) 13.0 - 18.0 g/dL Final   HCT  Date Value Ref Range Status  11/21/2021 37.5 (L) 39.0 - 52.0 % Final  09/22/2012 38.1 (L) 40.0 - 52.0 % Final   MCHC  Date Value Ref Range Status  11/21/2021 31.5 30.0 - 36.0 g/dL Final   [facility]  Date Value Ref Range Status  11/21/2021 24.5 (L) 26.0 - 34.0 pg Final   MCV  Date Value Ref Range Status  11/21/2021 78.0 (L) 80.0 - 100.0 fL Final  09/22/2012 81 80 - 100 fL Final   No results found for: "PLTCOUNTKUC", "LABPLAT", "POCPLA" RDW  Date Value Ref Range Status  11/21/2021 14.8 11.5 - 15.5 % Final  09/22/2012 15.0 (H) 11.5 - 14.5 % Final         Passed - Cr in normal range and within 360 days    Creat  Date Value Ref Range Status  03/13/2018 0.91 0.60 - 1.35 mg/dL Final   Creatinine, Ser  Date Value Ref Range Status  06/19/2022 1.01 0.61 - 1.24 mg/dL Final         Passed - eGFR in normal range and within 360 days    GFR, Est African American  Date Value Ref Range Status  03/13/2018 116 > OR = 60 mL/min/1.58m2 Final   GFR calc Af Amer  Date Value Ref Range Status  04/15/2020 >60 >60 mL/min Final   GFR, Est Non African American  Date Value Ref Range Status  03/13/2018 100 > OR = 60 mL/min/1.15m2 Final   GFR, Estimated  Date Value Ref Range Status  06/19/2022 >60 >60 mL/min Final    Comment:    (NOTE) Calculated using the CKD-EPI Creatinine Equation (2021)

## 2023-02-15 ENCOUNTER — Other Ambulatory Visit: Payer: Self-pay | Admitting: Internal Medicine

## 2023-02-15 ENCOUNTER — Other Ambulatory Visit: Payer: Self-pay

## 2023-02-15 DIAGNOSIS — I10 Essential (primary) hypertension: Secondary | ICD-10-CM

## 2023-02-15 NOTE — Telephone Encounter (Signed)
Requested medication (s) are due for refill today: Yes  Requested medication (s) are on the active medication list: Yes  Last refill:  11/06/22  Future visit scheduled: No  Notes to clinic:  Unable to refill per protocol, courtesy refill already given, routing for provider approval.      Requested Prescriptions  Pending Prescriptions Disp Refills   chlorthalidone (HYGROTON) 25 MG tablet 45 tablet 0    Sig: Take 0.5 tablets (12.5 mg total) by mouth daily.     Cardiovascular: Diuretics - Thiazide Failed - 02/15/2023 10:11 AM      Failed - Cr in normal range and within 180 days    Creat  Date Value Ref Range Status  03/13/2018 0.91 0.60 - 1.35 mg/dL Final   Creatinine, Ser  Date Value Ref Range Status  06/19/2022 1.01 0.61 - 1.24 mg/dL Final         Failed - K in normal range and within 180 days    Potassium  Date Value Ref Range Status  06/19/2022 3.2 (L) 3.5 - 5.1 mmol/L Final  09/22/2012 3.9 3.5 - 5.1 mmol/L Final         Failed - Na in normal range and within 180 days    Sodium  Date Value Ref Range Status  06/19/2022 137 135 - 145 mmol/L Final  01/24/2017 143 134 - 144 mmol/L Final  09/22/2012 139 136 - 145 mmol/L Final         Failed - Last BP in normal range    BP Readings from Last 1 Encounters:  06/19/22 (!) 144/92         Failed - Valid encounter within last 6 months    Recent Outpatient Visits           8 months ago Type 2 diabetes mellitus with hyperglycemia, without long-term current use of insulin (HCC)   [place] [facility] [place], Salvadore Oxford, NP   1 year ago Type 2 diabetes mellitus with hyperglycemia, without long-term current use of insulin (HCC)   [place] [facility] [place], Salvadore Oxford, NP   1 year ago Type 2 diabetes mellitus with hyperglycemia, without long-term current use of insulin (HCC)   [place] [facility] [place], [place] W, NP   2 years ago Type 2 diabetes mellitus with hyperglycemia,  without long-term current use of insulin [facility])   [facility] [place], [place] W, NP   2 years ago Primary hypertension    [facility] [place], Elnita Maxwell, NP               metFORMIN (GLUCOPHAGE) 1000 MG tablet 60 tablet 0    Sig: Take 1 tablet (1,000 mg total) by mouth 2 (two) times daily with a meal.     Endocrinology:  Diabetes - Biguanides Failed - 02/15/2023 10:11 AM      Failed - HBA1C is between 0 and 7.9 and within 180 days    Hemoglobin A1C  Date Value Ref Range Status  06/19/2022 6.2 (A) 4.0 - 5.6 % Final   Hgb A1c MFr Bld  Date Value Ref Range Status  11/21/2021 7.3 (H) 4.8 - 5.6 % Final    Comment:    (NOTE) Pre diabetes:          5.7%-6.4%  Diabetes:              >6.4%  Glycemic control for   <7.0% adults with diabetes  Failed - B12 Level in normal range and within 720 days    No results found for: "VITAMINB12"       Failed - Valid encounter within last 6 months    Recent Outpatient Visits           8 months ago Type 2 diabetes mellitus with hyperglycemia, without long-term current use of insulin (HCC)   [place] [facility] [place], [place], NP   1 year ago Type 2 diabetes mellitus with hyperglycemia, without long-term current use of insulin [facility])   [facility] [place], [place] W, NP   1 year ago Type 2 diabetes mellitus with hyperglycemia, without long-term current use of insulin (HCC)   [facility] [place], [place] W, NP   2 years ago Type 2 diabetes mellitus with hyperglycemia, without long-term current use of insulin (HCC)   [place] [facility] [place], [place] W, NP   2 years ago Primary hypertension   [place] [facility] [place], Elnita Maxwell, NP              Failed - CBC within normal limits and completed in the last 12 months    WBC  Date Value Ref Range Status  11/21/2021 7.1 4.0 - 10.5  K/uL Final   RBC  Date Value Ref Range Status  11/21/2021 4.81 4.22 - 5.81 MIL/uL Final   Hemoglobin  Date Value Ref Range Status  11/21/2021 11.8 (L) 13.0 - 17.0 g/dL Final   HGB  Date Value Ref Range Status  09/22/2012 12.5 (L) 13.0 - 18.0 g/dL Final   HCT  Date Value Ref Range Status  11/21/2021 37.5 (L) 39.0 - 52.0 % Final  09/22/2012 38.1 (L) 40.0 - 52.0 % Final   MCHC  Date Value Ref Range Status  11/21/2021 31.5 30.0 - 36.0 g/dL Final   [facility]  Date Value Ref Range Status  11/21/2021 24.5 (L) 26.0 - 34.0 pg Final   MCV  Date Value Ref Range Status  11/21/2021 78.0 (L) 80.0 - 100.0 fL Final  09/22/2012 81 80 - 100 fL Final   No results found for: "PLTCOUNTKUC", "LABPLAT", "POCPLA" RDW  Date Value Ref Range Status  11/21/2021 14.8 11.5 - 15.5 % Final  09/22/2012 15.0 (H) 11.5 - 14.5 % Final         Passed - Cr in normal range and within 360 days    Creat  Date Value Ref Range Status  03/13/2018 0.91 0.60 - 1.35 mg/dL Final   Creatinine, Ser  Date Value Ref Range Status  06/19/2022 1.01 0.61 - 1.24 mg/dL Final         Passed - eGFR in normal range and within 360 days    GFR, Est African American  Date Value Ref Range Status  03/13/2018 116 > OR = 60 mL/min/1.33m2 Final   GFR calc Af Amer  Date Value Ref Range Status  04/15/2020 >60 >60 mL/min Final   GFR, Est Non African American  Date Value Ref Range Status  03/13/2018 100 > OR = 60 mL/min/1.71m2 Final   GFR, Estimated  Date Value Ref Range Status  06/19/2022 >60 >60 mL/min Final    Comment:    (NOTE) Calculated using the CKD-EPI Creatinine Equation (2021)

## 2023-02-15 NOTE — Telephone Encounter (Signed)
Patient called, left VM to return the call to the office to schedule an OV for follow up.  ? ?

## 2023-02-17 ENCOUNTER — Other Ambulatory Visit: Payer: Self-pay

## 2023-02-17 MED FILL — Levothyroxine Sodium Tab 100 MCG: ORAL | 90 days supply | Qty: 90 | Fill #2 | Status: AC

## 2023-02-18 ENCOUNTER — Other Ambulatory Visit: Payer: Self-pay

## 2023-02-18 MED FILL — Metformin HCl Tab 1000 MG: ORAL | 30 days supply | Qty: 60 | Fill #0 | Status: AC

## 2023-02-18 MED FILL — Chlorthalidone Tab 25 MG: ORAL | 30 days supply | Qty: 15 | Fill #0 | Status: AC

## 2023-02-20 ENCOUNTER — Other Ambulatory Visit: Payer: Self-pay

## 2023-02-20 ENCOUNTER — Other Ambulatory Visit: Payer: Self-pay | Admitting: Internal Medicine

## 2023-02-20 MED FILL — Fluticasone Propionate Nasal Susp 50 MCG/ACT: NASAL | 30 days supply | Qty: 16 | Fill #1 | Status: AC

## 2023-02-21 ENCOUNTER — Other Ambulatory Visit: Payer: Self-pay

## 2023-02-21 MED ORDER — ATORVASTATIN CALCIUM 80 MG PO TABS
80.0000 mg | ORAL_TABLET | Freq: Every day | ORAL | 0 refills | Status: DC
  Filled 2023-02-21: qty 90, 90d supply, fill #0

## 2023-02-21 NOTE — Telephone Encounter (Signed)
Requested Prescriptions  Pending Prescriptions Disp Refills   atorvastatin (LIPITOR) 80 MG tablet 90 tablet 0    Sig: Take 1 tablet (80 mg total) by mouth once daily.     Cardiovascular:  Antilipid - Statins Failed - 02/20/2023 12:05 PM      Failed - Lipid Panel in normal range within the last 12 months    Cholesterol  Date Value Ref Range Status  06/19/2022 114 0 - 200 mg/dL Final   LDL Cholesterol (Calc)  Date Value Ref Range Status  03/13/2018 65 mg/dL (calc) Final    Comment:    Reference range: <100 . Desirable range <100 mg/dL for primary prevention;   <70 mg/dL for patients with CHD or diabetic patients  with > or = 2 CHD risk factors. Marland Kitchen LDL-C is now calculated using the Martin-Hopkins  calculation, which is a validated novel method providing  better accuracy than the Friedewald equation in the  estimation of LDL-C.  Horald Pollen et al. Lenox Ahr. 2355;732(20): 2061-2068  (http://education.[employer].com/faq/FAQ164)    LDL Cholesterol  Date Value Ref Range Status  06/19/2022 57 0 - 99 mg/dL Final    Comment:           Total Cholesterol/HDL:CHD Risk Coronary Heart Disease Risk Table                     Men   Women  1/2 Average Risk   3.4   3.3  Average Risk       5.0   4.4  2 X Average Risk   9.6   7.1  3 X Average Risk  23.4   11.0        Use the calculated Patient Ratio above and the CHD Risk Table to determine the patient's CHD Risk.        ATP III CLASSIFICATION (LDL):  <100     mg/dL   Optimal  254-270  mg/dL   Near or Above                    Optimal  130-159  mg/dL   Borderline  623-762  mg/dL   High  >831     mg/dL   Very High Performed at [facility], [address] [place], [place] [address]    HDL  Date Value Ref Range Status  06/19/2022 30 (L) >40 mg/dL Final   Triglycerides  Date Value Ref Range Status  06/19/2022 135 <150 mg/dL Final         Passed - Patient is not pregnant      Passed - Valid encounter within last 12  months    Recent Outpatient Visits           8 months ago Type 2 diabetes mellitus with hyperglycemia, without long-term current use of insulin [facility])   [place] [facility] [place], [place], NP   1 year ago Type 2 diabetes mellitus with hyperglycemia, without long-term current use of insulin [facility])   [place] [facility] [place], [place] W, NP   1 year ago Type 2 diabetes mellitus with hyperglycemia, without long-term current use of insulin [facility])   [place] [facility] [place], [place] W, NP   2 years ago Type 2 diabetes mellitus with hyperglycemia, without long-term current use of insulin [facility])   [place] [facility] [place], Salvadore Oxford, NP   2 years ago Primary hypertension   [place]  [facility] [place] Cirri, NP       Future Appointments             In 4 days Baity, Salvadore Oxford, NP  [facility], [place]

## 2023-02-24 ENCOUNTER — Other Ambulatory Visit: Payer: Self-pay

## 2023-02-24 ENCOUNTER — Other Ambulatory Visit: Payer: Self-pay | Admitting: Internal Medicine

## 2023-02-24 DIAGNOSIS — I10 Essential (primary) hypertension: Secondary | ICD-10-CM

## 2023-02-25 ENCOUNTER — Other Ambulatory Visit: Payer: Self-pay

## 2023-02-25 ENCOUNTER — Encounter: Payer: Self-pay | Admitting: Internal Medicine

## 2023-02-25 ENCOUNTER — Ambulatory Visit (INDEPENDENT_AMBULATORY_CARE_PROVIDER_SITE_OTHER): Payer: Self-pay | Admitting: Internal Medicine

## 2023-02-25 ENCOUNTER — Other Ambulatory Visit
Admission: RE | Admit: 2023-02-25 | Discharge: 2023-02-25 | Disposition: A | Discharge status: Home / Self Care | Payer: Self-pay | Attending: Internal Medicine | Admitting: Internal Medicine

## 2023-02-25 VITALS — BP 126/78 | HR 75 | Temp 96.6°F | Wt 238.0 lb

## 2023-02-25 DIAGNOSIS — I700 Atherosclerosis of aorta: Secondary | ICD-10-CM

## 2023-02-25 DIAGNOSIS — Z1211 Encounter for screening for malignant neoplasm of colon: Secondary | ICD-10-CM

## 2023-02-25 DIAGNOSIS — E1169 Type 2 diabetes mellitus with other specified complication: Secondary | ICD-10-CM

## 2023-02-25 DIAGNOSIS — E785 Hyperlipidemia, unspecified: Secondary | ICD-10-CM

## 2023-02-25 DIAGNOSIS — I2510 Atherosclerotic heart disease of native coronary artery without angina pectoris: Secondary | ICD-10-CM

## 2023-02-25 DIAGNOSIS — E039 Hypothyroidism, unspecified: Secondary | ICD-10-CM

## 2023-02-25 DIAGNOSIS — G4733 Obstructive sleep apnea (adult) (pediatric): Secondary | ICD-10-CM

## 2023-02-25 DIAGNOSIS — E1165 Type 2 diabetes mellitus with hyperglycemia: Secondary | ICD-10-CM | POA: Insufficient documentation

## 2023-02-25 DIAGNOSIS — I10 Essential (primary) hypertension: Secondary | ICD-10-CM

## 2023-02-25 DIAGNOSIS — Z6837 Body mass index (BMI) 37.0-37.9, adult: Secondary | ICD-10-CM

## 2023-02-25 DIAGNOSIS — D509 Iron deficiency anemia, unspecified: Secondary | ICD-10-CM

## 2023-02-25 LAB — LIPID PANEL
Cholesterol: 101 mg/dL (ref 0–200)
HDL: 29 mg/dL — ABNORMAL LOW (ref >40)
LDL Cholesterol: 29 mg/dL (ref 0–99)
Total CHOL/HDL Ratio: 3.5 RATIO
Triglycerides: 215 mg/dL — ABNORMAL HIGH (ref <150)
VLDL: 43 mg/dL — ABNORMAL HIGH (ref 0–40)

## 2023-02-25 LAB — COMPREHENSIVE METABOLIC PANEL
ALT: 36 U/L (ref 0–44)
AST: 31 U/L (ref 15–41)
Albumin: 3.8 g/dL (ref 3.5–5.0)
Alkaline Phosphatase: 70 U/L (ref 38–126)
Anion gap: 13 (ref 5–15)
BUN: 21 mg/dL — ABNORMAL HIGH (ref 6–20)
CO2: 24 mmol/L (ref 22–32)
Calcium: 9 mg/dL (ref 8.9–10.3)
Chloride: 103 mmol/L (ref 98–111)
Creatinine, Ser: 1 mg/dL (ref 0.61–1.24)
GFR, Estimated: >60 mL/min (ref >60)
Glucose, Bld: 119 mg/dL — ABNORMAL HIGH (ref 70–99)
Potassium: 3.5 mmol/L (ref 3.5–5.1)
Sodium: 140 mmol/L (ref 135–145)
Total Bilirubin: 0.5 mg/dL (ref 0.3–1.2)
Total Protein: 7.6 g/dL (ref 6.5–8.1)

## 2023-02-25 LAB — CBC
HCT: 35 % — ABNORMAL LOW (ref 39.0–52.0)
Hemoglobin: 11 g/dL — ABNORMAL LOW (ref 13.0–17.0)
MCH: 24.4 pg — ABNORMAL LOW (ref 26.0–34.0)
MCHC: 31.4 g/dL (ref 30.0–36.0)
MCV: 77.8 fL — ABNORMAL LOW (ref 80.0–100.0)
Platelets: 290 10*3/uL (ref 150–400)
RBC: 4.5 MIL/uL (ref 4.22–5.81)
RDW: 15.2 % (ref 11.5–15.5)
WBC: 7.4 10*3/uL (ref 4.0–10.5)
nRBC: 0 % (ref 0.0–0.2)

## 2023-02-25 LAB — POCT GLYCOSYLATED HEMOGLOBIN (HGB A1C): Hemoglobin A1C: 6.5 % — AB (ref 4.0–5.6)

## 2023-02-25 MED ORDER — CARVEDILOL 25 MG PO TABS
25.0000 mg | ORAL_TABLET | Freq: Two times a day (BID) | ORAL | 0 refills | Status: DC
Start: 2023-02-25 — End: 2023-05-27
  Filled 2023-02-25: qty 180, 90d supply, fill #0

## 2023-02-25 MED ORDER — ROSUVASTATIN CALCIUM 20 MG PO TABS
20.0000 mg | ORAL_TABLET | Freq: Every day | ORAL | 1 refills | Status: DC
  Filled 2023-02-25: qty 90, 90d supply, fill #0
  Filled 2023-05-22: qty 90, 90d supply, fill #1

## 2023-02-25 NOTE — Assessment & Plan Note (Signed)
C-Met and lipid profile today Encouraged him to consume a low-fat diet Continue atorvastatin and aspirin 

## 2023-02-25 NOTE — Assessment & Plan Note (Signed)
Encourage diet and exercise for weight loss 

## 2023-02-25 NOTE — Patient Instructions (Signed)

## 2023-02-25 NOTE — Assessment & Plan Note (Signed)
No angina C-Met and lipid profile today Encouraged him to consume a low-fat diet Continue carvedilol, atorvastatin and aspirin

## 2023-02-25 NOTE — Assessment & Plan Note (Signed)
POCT A1c 6.5% We will check urine microalbumin Encouraged him to consume a low-carb diet and exercise for weight loss Continue metformin We will request copy of eye exam Encouraged routine foot exam He declines immunizations

## 2023-02-25 NOTE — Assessment & Plan Note (Signed)
Controlled on amlodipine, carvedilol, chlorthalidone and losartan Reinforced DASH diet and exercise for weight loss C-Met today

## 2023-02-25 NOTE — Assessment & Plan Note (Signed)
TSH and free T4 reviewed Continue levothyroxine

## 2023-02-25 NOTE — Assessment & Plan Note (Signed)
C-Met and lipid profile today Encouraged him to consume a low-fat diet Continue atorvastatin 

## 2023-02-25 NOTE — Assessment & Plan Note (Signed)
Encourage weight loss as this can help reduce sleep apnea symptoms Noncompliant with CPAP

## 2023-02-25 NOTE — Assessment & Plan Note (Signed)
CBC today.  

## 2023-02-25 NOTE — Progress Notes (Signed)
Subjective:    Patient ID: Charles Mathis, male    DOB: June 12, 1971, 52 y.o.   MRN: [id number]  HPI  Patient presents in clinic today for follow-up of chronic conditions.  HTN: His BP today is 126/78.  He is taking amlodipine, carvedilol, chlorthalidone and losartan as prescribed.  ECG from 01/2019 reviewed.  HLD with CAD, aortic atherosclerosis: His last LDL was 57, triglycerides 952, 06/2022.  He denies myalgias on atorvastatin.  He is taking aspirin as well.  He does not consume a low-fat diet.  DM2: His last A1c was 6.2%, 06/2022.  He is taking metformin as prescribed.  He does not check his sugars.  He does not check his feet routinely.  His last eye exam was 07/2022, [facility].  Flu never.  Pneumovax never.  COVID x 2.  Hypothyroidism: He denies any issues on his current dose of levothyroxine.  He does not follow with endocrinology.  OSA: He averages 8 hours of sleep per night without the use of CPAP.  There is no sleep study on file.  Anemia: His last H/H was 11.8/37.5, 11/2021.  He is not taking any oral iron at this time.  He does not follow with hematology.  Review of Systems     Past Medical History:  Diagnosis Date   Allergy    Diabetes mellitus without complication (HCC)    Hypertension    Renal cancer, left (HCC) 01/2019   Left Nephrectomy    Current Outpatient Medications  Medication Sig Dispense Refill   acetaminophen (TYLENOL) 325 MG tablet Take 650 mg by mouth every 6 (six) hours as needed for headache.     amLODipine (NORVASC) 10 MG tablet Take 1 tablet (10 mg total) by mouth daily. 90 tablet 0   amoxicillin-clavulanate (AUGMENTIN) 875-125 MG tablet Take 1 tablet by mouth 2 (two) times daily. 20 tablet 0   aspirin EC 81 MG tablet Take 81 mg by mouth daily.     atorvastatin (LIPITOR) 80 MG tablet Take 1 tablet (80 mg total) by mouth once daily. 90 tablet 0   blood glucose meter kit and supplies Dispense based on patient and insurance preference. Use to  check blood sugar daily as directed. DX: E11.9. 1 each 0   carvedilol (COREG) 25 MG tablet Take 1 tablet (25 mg total) by mouth 2 (two) times daily with a meal. 180 tablet 0   chlorthalidone (HYGROTON) 25 MG tablet Take 0.5 tablets (12.5 mg total) by mouth daily. 15 tablet 0   fluticasone (FLONASE) 50 MCG/ACT nasal spray Place 2 sprays into both nostrils daily. 48 g 0   levothyroxine (SYNTHROID) 100 MCG tablet TAKE ONE TABLET BY MOUTH EVERY DAY 90 tablet 3   metFORMIN (GLUCOPHAGE) 1000 MG tablet Take 1 tablet (1,000 mg total) by mouth 2 (two) times daily with a meal. 60 tablet 0   montelukast (SINGULAIR) 10 MG tablet Take 1 tablet (10 mg total) by mouth once daily. 90 tablet 2   olmesartan (BENICAR) 40 MG tablet Take 1 tablet (40 mg total) by mouth daily. 90 tablet 0   potassium chloride SA (KLOR-CON M) 20 MEQ tablet Take 1 tablet (20 mEq total) by mouth 2 (two) times daily. 180 tablet 0   Rightest GL300 Lancets MISC Use to check blood sugar daily as directed 100 each 0   glucose blood (RIGHTEST GS550 BLOOD GLUCOSE) test strip Use to check blood sugar daily as directed 100 each 0   No current facility-administered medications for this  visit.    No Known Allergies  Family History  Problem Relation Age of Onset   Diabetes Mother    Heart disease Mother    Mental illness Mother    Thyroid disease Mother    Heart disease Father    Diabetes Father    Mental illness Father     Social History   Socioeconomic History   Marital status: Married    Spouse name: Not on file   Number of children: Not on file   Years of education: Not on file   Highest education level: Not on file  Occupational History   Not on file  Tobacco Use   Smoking status: Never   Smokeless tobacco: Never  Vaping Use   Vaping status: Never Used  Substance and Sexual Activity   Alcohol use: No   Drug use: No   Sexual activity: Yes    Birth control/protection: Other-see comments    Comment: mutually monogamous  relationship partner postmenopausal  Other Topics Concern   Not on file  Social History Narrative   In [place]/ with wife; son [downs syndrome]; no smoking/ no alcohol; works for [employer].    Social Determinants of Health   Financial Resource Strain: Not on file  Food Insecurity: Not on file  Transportation Needs: Not on file  Physical Activity: Not on file  Stress: Not on file  Social Connections: Not on file  Intimate Partner Violence: Not on file     Constitutional: Denies fever, malaise, fatigue, headache or abrupt weight changes.  HEENT: Denies eye pain, eye redness, ear pain, ringing in the ears, wax buildup, runny nose, nasal congestion, bloody nose, or sore throat. Respiratory: Denies difficulty breathing, shortness of breath, cough or sputum production.   Cardiovascular: Denies chest pain, chest tightness, palpitations or swelling in the hands or feet.  Gastrointestinal: Denies abdominal pain, bloating, constipation, diarrhea or blood in the stool.  GU: Denies urgency, frequency, pain with urination, burning sensation, blood in urine, odor or discharge. Musculoskeletal: Denies decrease in range of motion, difficulty with gait, muscle pain or joint pain and swelling.  Skin: Denies redness, rashes, lesions or ulcercations.  Neurological: Denies dizziness, difficulty with memory, difficulty with speech or problems with balance and coordination.  Psych: Denies anxiety, depression, SI/HI.  No other specific complaints in a complete review of systems (except as listed in HPI above).  Objective:   Physical Exam  BP 126/78 (BP Location: Left Arm, Patient Position: Sitting, Cuff Size: Large)   Pulse 75   Temp (!) 96.6 F (35.9 C) (Temporal)   Wt 238 lb (108 kg)   SpO2 95%   BMI 37.28 kg/m   Wt Readings from Last 3 Encounters:  06/19/22 233 lb (105.7 kg)  06/22/21 257 lb 3.2 oz (116.7 kg)  12/14/20 256 lb 3.2 oz (116.2 kg)    General: Appears his stated  age, obese, in NAD. Skin: Warm, dry and intact.  Patient has hyperpigmentation noted of neck and bilateral hands.  No ulcerations noted. HEENT: Head: normal shape and size; Eyes: sclera white, no icterus, conjunctiva pink, PERRLA and EOMs intact;  Cardiovascular: Normal rate and rhythm. S1,S2 noted.  No murmur, rubs or gallops noted. No JVD or BLE edema. No carotid bruits noted. Pulmonary/Chest: Normal effort and positive vesicular breath sounds. No respiratory distress. No wheezes, rales or ronchi noted.  Musculoskeletal: No difficulty with gait.  Neurological: Alert and oriented. Coordination normal.  Psychiatric: Mood and affect normal. Behavior is normal. Judgment and thought  content normal.    BMET    Component Value Date/Time   NA 137 06/19/2022 1038   NA 143 01/24/2017 1306   NA 139 09/22/2012 1156   K 3.2 (L) 06/19/2022 1038   K 3.9 09/22/2012 1156   CL 102 06/19/2022 1038   CL 106 09/22/2012 1156   CO2 25 06/19/2022 1038   CO2 29 09/22/2012 1156   GLUCOSE 170 (H) 06/19/2022 1038   GLUCOSE 96 09/22/2012 1156   BUN 19 06/19/2022 1038   BUN 20 01/24/2017 1306   BUN 14 09/22/2012 1156   CREATININE 1.01 06/19/2022 1038   CREATININE 0.91 03/13/2018 0823   CALCIUM 9.4 06/19/2022 1038   CALCIUM 8.6 09/22/2012 1156   GFRNONAA >60 06/19/2022 1038   GFRNONAA 100 03/13/2018 0823   GFRAA >60 04/15/2020 0947   GFRAA 116 03/13/2018 0823    Lipid Panel     Component Value Date/Time   CHOL 114 06/19/2022 1038   TRIG 135 06/19/2022 1038   HDL 30 (L) 06/19/2022 1038   CHOLHDL 3.8 06/19/2022 1038   VLDL 27 06/19/2022 1038   LDLCALC 57 06/19/2022 1038   LDLCALC 65 03/13/2018 0823    CBC    Component Value Date/Time   WBC 7.1 11/21/2021 0840   RBC 4.81 11/21/2021 0840   HGB 11.8 (L) 11/21/2021 0840   HGB 12.5 (L) 09/22/2012 1156   HCT 37.5 (L) 11/21/2021 0840   HCT 38.1 (L) 09/22/2012 1156   PLT 244 11/21/2021 0840   PLT 275 09/22/2012 1156   MCV 78.0 (L) 11/21/2021  0840   MCV 81 09/22/2012 1156   MCH 24.5 (L) 11/21/2021 0840   MCHC 31.5 11/21/2021 0840   RDW 14.8 11/21/2021 0840   RDW 15.0 (H) 09/22/2012 1156   LYMPHSABS 1.8 12/16/2020 0831   MONOABS 0.8 12/16/2020 0831   EOSABS 0.6 (H) 12/16/2020 0831   BASOSABS 0.0 12/16/2020 0831    Hgb A1C Lab Results  Component Value Date   HGBA1C 6.2 (A) 06/19/2022          Assessment & Plan:    RTC in 6 months, follow-up chronic conditions Nicki Reaper, NP

## 2023-02-26 ENCOUNTER — Other Ambulatory Visit: Payer: Self-pay

## 2023-03-10 ENCOUNTER — Other Ambulatory Visit: Payer: Self-pay | Admitting: Internal Medicine

## 2023-03-10 DIAGNOSIS — I10 Essential (primary) hypertension: Secondary | ICD-10-CM

## 2023-03-11 ENCOUNTER — Other Ambulatory Visit: Payer: Self-pay

## 2023-03-11 ENCOUNTER — Other Ambulatory Visit: Payer: Self-pay | Admitting: Internal Medicine

## 2023-03-11 DIAGNOSIS — I10 Essential (primary) hypertension: Secondary | ICD-10-CM

## 2023-03-12 ENCOUNTER — Other Ambulatory Visit: Payer: Self-pay

## 2023-03-12 MED FILL — Olmesartan Medoxomil Tab 40 MG: ORAL | 90 days supply | Qty: 90 | Fill #0 | Status: AC

## 2023-03-12 NOTE — Telephone Encounter (Signed)
Requested Prescriptions  Pending Prescriptions Disp Refills   olmesartan (BENICAR) 40 MG tablet 90 tablet 1    Sig: Take 1 tablet (40 mg total) by mouth daily.     Cardiovascular:  Angiotensin Receptor Blockers Passed - 03/11/2023  9:55 AM      Passed - Cr in normal range and within 180 days    Creat  Date Value Ref Range Status  03/13/2018 0.91 0.60 - 1.35 mg/dL Final   Creatinine, Ser  Date Value Ref Range Status  02/25/2023 1.00 0.61 - 1.24 mg/dL Final         Passed - K in normal range and within 180 days    Potassium  Date Value Ref Range Status  02/25/2023 3.5 3.5 - 5.1 mmol/L Final  09/22/2012 3.9 3.5 - 5.1 mmol/L Final         Passed - Patient is not pregnant      Passed - Last BP in normal range    BP Readings from Last 1 Encounters:  02/25/23 126/78         Passed - Valid encounter within last 6 months    Recent Outpatient Visits           2 weeks ago Type 2 diabetes mellitus with hyperglycemia, without long-term current use of insulin [facility])   [place] [facility] [place], Salvadore Oxford, NP   8 months ago Type 2 diabetes mellitus with hyperglycemia, without long-term current use of insulin [facility])   [place] [facility] [place], [place], NP   1 year ago Type 2 diabetes mellitus with hyperglycemia, without long-term current use of insulin [facility])   [place] [facility] [place], [place] W, NP   1 year ago Type 2 diabetes mellitus with hyperglycemia, without long-term current use of insulin [facility])   [place] [facility] [place], [place], NP   2 years ago Type 2 diabetes mellitus with hyperglycemia, without long-term current use of insulin [facility])   [place] [facility] [place], Salvadore Oxford, NP       Future Appointments             In 5 months Baity, Salvadore Oxford, NP  [facility]

## 2023-03-13 ENCOUNTER — Other Ambulatory Visit: Payer: Self-pay

## 2023-03-17 ENCOUNTER — Other Ambulatory Visit: Payer: Self-pay | Admitting: Internal Medicine

## 2023-03-17 DIAGNOSIS — I10 Essential (primary) hypertension: Secondary | ICD-10-CM

## 2023-03-19 ENCOUNTER — Other Ambulatory Visit: Payer: Self-pay

## 2023-03-19 ENCOUNTER — Other Ambulatory Visit: Payer: Self-pay | Admitting: Internal Medicine

## 2023-03-19 DIAGNOSIS — I10 Essential (primary) hypertension: Secondary | ICD-10-CM

## 2023-03-21 ENCOUNTER — Other Ambulatory Visit: Payer: Self-pay

## 2023-03-21 MED FILL — Fluticasone Propionate Nasal Susp 50 MCG/ACT: NASAL | 30 days supply | Qty: 16 | Fill #2 | Status: AC

## 2023-03-21 MED FILL — Metformin HCl Tab 1000 MG: ORAL | 90 days supply | Qty: 180 | Fill #0 | Status: AC

## 2023-03-21 NOTE — Telephone Encounter (Signed)
Requested Prescriptions  Pending Prescriptions Disp Refills   chlorthalidone (HYGROTON) 25 MG tablet 45 tablet 1    Sig: Take 0.5 tablets (12.5 mg total) by mouth daily.     Cardiovascular: Diuretics - Thiazide Passed - 03/19/2023  9:01 AM      Passed - Cr in normal range and within 180 days    Creat  Date Value Ref Range Status  03/13/2018 0.91 0.60 - 1.35 mg/dL Final   Creatinine, Ser  Date Value Ref Range Status  02/25/2023 1.00 0.61 - 1.24 mg/dL Final         Passed - K in normal range and within 180 days    Potassium  Date Value Ref Range Status  02/25/2023 3.5 3.5 - 5.1 mmol/L Final  09/22/2012 3.9 3.5 - 5.1 mmol/L Final         Passed - Na in normal range and within 180 days    Sodium  Date Value Ref Range Status  02/25/2023 140 135 - 145 mmol/L Final  01/24/2017 143 134 - 144 mmol/L Final  09/22/2012 139 136 - 145 mmol/L Final         Passed - Last BP in normal range    BP Readings from Last 1 Encounters:  02/25/23 126/78         Passed - Valid encounter within last 6 months    Recent Outpatient Visits           3 weeks ago Type 2 diabetes mellitus with hyperglycemia, without long-term current use of insulin (HCC)   [place] [facility] [place], Salvadore Oxford, NP   9 months ago Type 2 diabetes mellitus with hyperglycemia, without long-term current use of insulin (HCC)   [place] [facility] [place], Salvadore Oxford, NP   1 year ago Type 2 diabetes mellitus with hyperglycemia, without long-term current use of insulin [facility])   [place] [place], [place] W, NP   1 year ago Type 2 diabetes mellitus with hyperglycemia, without long-term current use of insulin (HCC)   [place] [facility] [place], [place] W, NP   2 years ago Type 2 diabetes mellitus with hyperglycemia, without long-term current use of insulin (HCC)    [facility] [place], Salvadore Oxford, NP       Future  Appointments             In 5 months Baity, Salvadore Oxford, NP  [facility], [facility]             metFORMIN (GLUCOPHAGE) 1000 MG tablet 180 tablet 1    Sig: Take 1 tablet (1,000 mg total) by mouth 2 (two) times daily with a meal.     Endocrinology:  Diabetes - Biguanides Failed - 03/19/2023  9:01 AM      Failed - B12 Level in normal range and within 720 days    No results found for: "VITAMINB12"       Failed - CBC within normal limits and completed in the last 12 months    WBC  Date Value Ref Range Status  02/25/2023 7.4 4.0 - 10.5 K/uL Final   RBC  Date Value Ref Range Status  02/25/2023 4.50 4.22 - 5.81 MIL/uL Final   Hemoglobin  Date Value Ref Range Status  02/25/2023 11.0 (L) 13.0 - 17.0 g/dL Final   HGB  Date Value Ref Range Status  09/22/2012 12.5 (L) 13.0 - 18.0 g/dL  Final   HCT  Date Value Ref Range Status  02/25/2023 35.0 (L) 39.0 - 52.0 % Final  09/22/2012 38.1 (L) 40.0 - 52.0 % Final   MCHC  Date Value Ref Range Status  02/25/2023 31.4 30.0 - 36.0 g/dL Final   [facility]  Date Value Ref Range Status  02/25/2023 24.4 (L) 26.0 - 34.0 pg Final   MCV  Date Value Ref Range Status  02/25/2023 77.8 (L) 80.0 - 100.0 fL Final  09/22/2012 81 80 - 100 fL Final   No results found for: "PLTCOUNTKUC", "LABPLAT", "POCPLA" RDW  Date Value Ref Range Status  02/25/2023 15.2 11.5 - 15.5 % Final  09/22/2012 15.0 (H) 11.5 - 14.5 % Final         Passed - Cr in normal range and within 360 days    Creat  Date Value Ref Range Status  03/13/2018 0.91 0.60 - 1.35 mg/dL Final   Creatinine, Ser  Date Value Ref Range Status  02/25/2023 1.00 0.61 - 1.24 mg/dL Final         Passed - HBA1C is between 0 and 7.9 and within 180 days    Hemoglobin A1C  Date Value Ref Range Status  02/25/2023 6.5 (A) 4.0 - 5.6 % Final   Hgb A1c MFr Bld  Date Value Ref Range Status  11/21/2021 7.3 (H) 4.8 - 5.6 % Final    Comment:    (NOTE) Pre diabetes:           5.7%-6.4%  Diabetes:              >6.4%  Glycemic control for   <7.0% adults with diabetes          Passed - eGFR in normal range and within 360 days    GFR, Est African American  Date Value Ref Range Status  03/13/2018 116 > OR = 60 mL/min/1.83m2 Final   GFR calc Af Amer  Date Value Ref Range Status  04/15/2020 >60 >60 mL/min Final   GFR, Est Non African American  Date Value Ref Range Status  03/13/2018 100 > OR = 60 mL/min/1.58m2 Final   GFR, Estimated  Date Value Ref Range Status  02/25/2023 >60 >60 mL/min Final    Comment:    (NOTE) Calculated using the CKD-EPI Creatinine Equation (2021)          Passed - Valid encounter within last 6 months    Recent Outpatient Visits           3 weeks ago Type 2 diabetes mellitus with hyperglycemia, without long-term current use of insulin [facility])   [place] [facility] [place], Salvadore Oxford, NP   9 months ago Type 2 diabetes mellitus with hyperglycemia, without long-term current use of insulin [facility])   [place] [facility] [place], [place] W, NP   1 year ago Type 2 diabetes mellitus with hyperglycemia, without long-term current use of insulin [facility])   [place] [facility] [place], [place], NP   1 year ago Type 2 diabetes mellitus with hyperglycemia, without long-term current use of insulin [facility])   [place] [facility] [place], [place], NP   2 years ago Type 2 diabetes mellitus with hyperglycemia, without long-term current use of insulin [facility])   [place] [facility] Lorre Munroe, NP       Future Appointments             In 5 months  Lorre Munroe, NP [place] [facility], [facility]            Refused Prescriptions Disp Refills   olmesartan (BENICAR) 40 MG tablet 90 tablet 0    Sig: Take 1 tablet (40 mg total) by mouth daily.     Cardiovascular:  Angiotensin Receptor Blockers Passed - 03/19/2023  9:01 AM      Passed - Cr  in normal range and within 180 days    Creat  Date Value Ref Range Status  03/13/2018 0.91 0.60 - 1.35 mg/dL Final   Creatinine, Ser  Date Value Ref Range Status  02/25/2023 1.00 0.61 - 1.24 mg/dL Final         Passed - K in normal range and within 180 days    Potassium  Date Value Ref Range Status  02/25/2023 3.5 3.5 - 5.1 mmol/L Final  09/22/2012 3.9 3.5 - 5.1 mmol/L Final         Passed - Patient is not pregnant      Passed - Last BP in normal range    BP Readings from Last 1 Encounters:  02/25/23 126/78         Passed - Valid encounter within last 6 months    Recent Outpatient Visits           3 weeks ago Type 2 diabetes mellitus with hyperglycemia, without long-term current use of insulin [facility])   [place] [facility] [place], Salvadore Oxford, NP   9 months ago Type 2 diabetes mellitus with hyperglycemia, without long-term current use of insulin [facility])   [place] [facility] [place], [place] W, NP   1 year ago Type 2 diabetes mellitus with hyperglycemia, without long-term current use of insulin [facility])   [place] [place], [place] W, NP   1 year ago Type 2 diabetes mellitus with hyperglycemia, without long-term current use of insulin [facility])   [place] [facility] [place], [place], NP   2 years ago Type 2 diabetes mellitus with hyperglycemia, without long-term current use of insulin [facility])   [place] [facility] [place], Salvadore Oxford, NP       Future Appointments             In 5 months Baity, Salvadore Oxford, NP [facility], [facility]

## 2023-03-22 ENCOUNTER — Other Ambulatory Visit: Payer: Self-pay

## 2023-03-27 ENCOUNTER — Other Ambulatory Visit: Payer: Self-pay | Admitting: Internal Medicine

## 2023-03-27 DIAGNOSIS — I10 Essential (primary) hypertension: Secondary | ICD-10-CM

## 2023-03-28 ENCOUNTER — Other Ambulatory Visit: Payer: Self-pay

## 2023-03-28 MED ORDER — MONTELUKAST SODIUM 10 MG PO TABS
10.0000 mg | ORAL_TABLET | Freq: Every day | ORAL | 1 refills | Status: DC
  Filled 2023-03-28: qty 90, 90d supply, fill #0
  Filled 2023-06-30: qty 90, 90d supply, fill #1

## 2023-03-28 MED ORDER — POTASSIUM CHLORIDE CRYS ER 20 MEQ PO TBCR
20.0000 meq | EXTENDED_RELEASE_TABLET | Freq: Two times a day (BID) | ORAL | 0 refills | Status: DC
Start: 2023-03-28 — End: 2023-07-02
  Filled 2023-03-28: qty 180, 90d supply, fill #0

## 2023-03-28 NOTE — Telephone Encounter (Signed)
Requested Prescriptions  Pending Prescriptions Disp Refills   montelukast (SINGULAIR) 10 MG tablet 90 tablet 2    Sig: Take 1 tablet (10 mg total) by mouth once daily.     Pulmonology:  Leukotriene Inhibitors Passed - 03/27/2023 12:10 PM      Passed - Valid encounter within last 12 months    Recent Outpatient Visits           1 month ago Type 2 diabetes mellitus with hyperglycemia, without long-term current use of insulin [facility])   [place] [facility] [place], [place], NP   9 months ago Type 2 diabetes mellitus with hyperglycemia, without long-term current use of insulin [facility])   [place] [facility] [place], [place] W, NP   1 year ago Type 2 diabetes mellitus with hyperglycemia, without long-term current use of insulin [facility])   [place] [facility] [place], [place], NP   1 year ago Type 2 diabetes mellitus with hyperglycemia, without long-term current use of insulin [facility])   [place] [facility] [place], [place], NP   2 years ago Type 2 diabetes mellitus with hyperglycemia, without long-term current use of insulin [facility])   [place] [facility] [place], Salvadore Oxford, NP       Future Appointments             In 5 months [place], Salvadore Oxford, NP [facility]             potassium chloride SA (KLOR-CON M) 20 MEQ tablet 180 tablet 0    Sig: Take 1 tablet (20 mEq total) by mouth 2 (two) times daily.     Endocrinology:  Minerals - Potassium Supplementation Passed - 03/27/2023 12:10 PM      Passed - K in normal range and within 360 days    Potassium  Date Value Ref Range Status  02/25/2023 3.5 3.5 - 5.1 mmol/L Final  09/22/2012 3.9 3.5 - 5.1 mmol/L Final         Passed - Cr in normal range and within 360 days    Creat  Date Value Ref Range Status  03/13/2018 0.91 0.60 - 1.35 mg/dL Final   Creatinine, Ser  Date Value Ref Range Status  02/25/2023 1.00 0.61 - 1.24 mg/dL  Final         Passed - Valid encounter within last 12 months    Recent Outpatient Visits           1 month ago Type 2 diabetes mellitus with hyperglycemia, without long-term current use of insulin [facility])   [place] [facility] [place], Salvadore Oxford, NP   9 months ago Type 2 diabetes mellitus with hyperglycemia, without long-term current use of insulin [facility])   [place] [facility] [place], [place], NP   1 year ago Type 2 diabetes mellitus with hyperglycemia, without long-term current use of insulin [facility])   [place] [facility] [place], [place], NP   1 year ago Type 2 diabetes mellitus with hyperglycemia, without long-term current use of insulin [facility])   [place] [facility] [place], [place], NP   2 years ago Type 2 diabetes mellitus with hyperglycemia, without long-term current use of insulin [facility])   [place] [facility] [place], Salvadore Oxford, NP       Future Appointments  In 5 months Baity, Salvadore Oxford, NP [facility], [facility]

## 2023-03-29 ENCOUNTER — Other Ambulatory Visit: Payer: Self-pay

## 2023-03-29 ENCOUNTER — Telehealth: Payer: Self-pay | Admitting: Internal Medicine

## 2023-03-29 DIAGNOSIS — I10 Essential (primary) hypertension: Secondary | ICD-10-CM

## 2023-03-29 MED ORDER — CHLORTHALIDONE 25 MG PO TABS
12.5000 mg | ORAL_TABLET | Freq: Every day | ORAL | 1 refills | Status: DC
Start: 2023-03-29 — End: 2023-09-18
  Filled 2023-03-29 (×2): qty 45, 90d supply, fill #0
  Filled 2023-06-17: qty 45, 90d supply, fill #1

## 2023-03-29 NOTE — Telephone Encounter (Addendum)
Medication Refill - Medication:  chlorthalidone (HYGROTON) 25 MG tablet   *Patient states he has been trying to get this refilled for a week or more now, he stated he stopped by the office on Wednesday, 03/27/2023 about this as well, patient states that he needs this medication. Patient is completely out as of yesterday.    Has the patient contacted their pharmacy? Yes, advised to contact PCP  Preferred Pharmacy (with phone number or street name):  [facility]  Phone: [phone] Fax: [phone]  Has the patient been seen for an appointment in the last year OR does the patient have an upcoming appointment? Yes, has his 36mo follow up scheduled for 2.17.2025

## 2023-03-29 NOTE — Telephone Encounter (Signed)
RX sent to the pharmacy  Thanks,   -Vernona Rieger

## 2023-03-29 NOTE — Telephone Encounter (Signed)
Unable to reorder chlorthalidone, routing for approval.

## 2023-04-09 ENCOUNTER — Telehealth: Payer: Self-pay | Admitting: Physician Assistant

## 2023-04-09 ENCOUNTER — Other Ambulatory Visit: Payer: Self-pay

## 2023-04-09 DIAGNOSIS — B9789 Other viral agents as the cause of diseases classified elsewhere: Secondary | ICD-10-CM

## 2023-04-09 DIAGNOSIS — J019 Acute sinusitis, unspecified: Secondary | ICD-10-CM

## 2023-04-09 MED ORDER — PREDNISONE 10 MG PO TABS
ORAL_TABLET | ORAL | 0 refills | Status: AC
Start: 2023-04-09 — End: 2023-04-14
  Filled 2023-04-09: qty 21, 6d supply, fill #0

## 2023-04-09 NOTE — Progress Notes (Signed)
I have spent 5 minutes in review of e-visit questionnaire, review and updating patient chart, medical decision making and response to patient.   Mia Milan Cody Jacklynn Dehaas, PA-C    

## 2023-04-09 NOTE — Progress Notes (Signed)
E-Visit for Sinus Problems  We are sorry that you are not feeling well.  Here is how we plan to help!  Based on what you have shared with me it looks like you have sinusitis.  Sinusitis is inflammation and infection in the sinus cavities of the head.  Based on your presentation I believe you most likely have Acute Viral Sinusitis.This is an infection most likely caused by a virus. There is not specific treatment for viral sinusitis other than to help you with the symptoms until the infection runs its course.  You may use an oral decongestant such as Mucinex D or if you have glaucoma or high blood pressure use plain Mucinex. Saline nasal spray help and can safely be used as often as needed for congestion, I have prescribed: a steroid tablet pack to take as directed to reduce sinus inflammation and speed recovery.  Some authorities believe that zinc sprays or the use of Echinacea may shorten the course of your symptoms.  Sinus infections are not as easily transmitted as other respiratory infection, however we still recommend that you avoid close contact with loved ones, especially the very young and elderly.  Remember to wash your hands thoroughly throughout the day as this is the number one way to prevent the spread of infection!  Home Care: Only take medications as instructed by your medical team. Do not take these medications with alcohol. A steam or ultrasonic humidifier can help congestion.  You can place a towel over your head and breathe in the steam from hot water coming from a faucet. Avoid close contacts especially the very young and the elderly. Cover your mouth when you cough or sneeze. Always remember to wash your hands.  Get Help Right Away If: You develop worsening fever or sinus pain. You develop a severe head ache or visual changes. Your symptoms persist after you have completed your treatment plan.  Make sure you Understand these instructions. Will watch your condition. Will  get help right away if you are not doing well or get worse.   Thank you for choosing an e-visit.  Your e-visit answers were reviewed by a board certified advanced clinical practitioner to complete your personal care plan. Depending upon the condition, your plan could have included both over the counter or prescription medications.  Please review your pharmacy choice. Make sure the pharmacy is open so you can pick up prescription now. If there is a problem, you may contact your provider through [employer] and have the prescription routed to another pharmacy.  Your safety is important to [place]. If you have drug allergies check your prescription carefully.   For the next 24 hours you can use MyChart to ask questions about today's visit, request a non-urgent call back, or ask for a work or school excuse. You will get an email in the next two days asking about your experience. I hope that your e-visit has been valuable and will speed your recovery.

## 2023-04-19 ENCOUNTER — Other Ambulatory Visit: Payer: Self-pay

## 2023-04-21 ENCOUNTER — Other Ambulatory Visit: Payer: Self-pay | Admitting: Internal Medicine

## 2023-04-21 ENCOUNTER — Other Ambulatory Visit: Payer: Self-pay

## 2023-04-21 DIAGNOSIS — I10 Essential (primary) hypertension: Secondary | ICD-10-CM

## 2023-04-22 ENCOUNTER — Other Ambulatory Visit: Payer: Self-pay | Admitting: Internal Medicine

## 2023-04-22 ENCOUNTER — Other Ambulatory Visit: Payer: Self-pay

## 2023-04-22 MED FILL — Amlodipine Besylate Tab 10 MG (Base Equivalent): ORAL | 90 days supply | Qty: 90 | Fill #0 | Status: AC

## 2023-04-22 MED FILL — Fluticasone Propionate Nasal Susp 50 MCG/ACT: NASAL | 90 days supply | Qty: 48 | Fill #0 | Status: AC

## 2023-04-22 NOTE — Telephone Encounter (Signed)
Requested Prescriptions  Pending Prescriptions Disp Refills   amLODipine (NORVASC) 10 MG tablet 90 tablet 0    Sig: Take 1 tablet (10 mg total) by mouth daily.     Cardiovascular: Calcium Channel Blockers 2 Passed - 04/21/2023  9:35 PM      Passed - Last BP in normal range    BP Readings from Last 1 Encounters:  02/25/23 126/78         Passed - Last Heart Rate in normal range    Pulse Readings from Last 1 Encounters:  02/25/23 75         Passed - Valid encounter within last 6 months    Recent Outpatient Visits           1 month ago Type 2 diabetes mellitus with hyperglycemia, without long-term current use of insulin [facility])   [place] [facility] [place], [place], NP   10 months ago Type 2 diabetes mellitus with hyperglycemia, without long-term current use of insulin [facility])   [place] [facility] [place], [place], NP   1 year ago Type 2 diabetes mellitus with hyperglycemia, without long-term current use of insulin [facility])   [place] [facility] [place], [place] W, NP   1 year ago Type 2 diabetes mellitus with hyperglycemia, without long-term current use of insulin [facility])   [place] [facility] [place], [place] W, NP   2 years ago Type 2 diabetes mellitus with hyperglycemia, without long-term current use of insulin [facility])   [place] [facility] [place], Salvadore Oxford, NP       Future Appointments             In 4 months [place], Salvadore Oxford, NP [facility] [facility], [facility]             fluticasone [facility]) 50 MCG/ACT nasal spray 48 g 0    Sig: Place 2 sprays into both nostrils daily.     Ear, Nose, and Throat: Nasal Preparations - Corticosteroids Passed - 04/21/2023  9:35 PM      Passed - Valid encounter within last 12 months    Recent Outpatient Visits           1 month ago Type 2 diabetes mellitus with hyperglycemia, without long-term current use of insulin [facility])   [facility] [place], [place], NP   10 months ago Type 2 diabetes mellitus with hyperglycemia, without long-term current use of insulin [facility])   [place] [facility] [place], [place], NP   1 year ago Type 2 diabetes mellitus with hyperglycemia, without long-term current use of insulin [facility])   [place] [facility] [place], [place], NP   1 year ago Type 2 diabetes mellitus with hyperglycemia, without long-term current use of insulin [facility])   [place] [facility] [place], [place], NP   2 years ago Type 2 diabetes mellitus with hyperglycemia, without long-term current use of insulin [facility])   [place] [facility] [place], Salvadore Oxford, NP       Future Appointments             In 4 months Baity, Salvadore Oxford, NP  [facility], [facility]

## 2023-04-23 ENCOUNTER — Other Ambulatory Visit: Payer: Self-pay

## 2023-04-23 MED ORDER — LANCETS 33G MISC
0 refills | Status: DC
  Filled 2023-04-23: qty 100, 100d supply, fill #0

## 2023-04-23 MED ORDER — RIGHTEST GS550 BLOOD GLUCOSE VI STRP
ORAL_STRIP | 0 refills | Status: DC
  Filled 2023-04-23: qty 100, 100d supply, fill #0

## 2023-04-23 NOTE — Telephone Encounter (Signed)
Requested Prescriptions  Pending Prescriptions Disp Refills   glucose blood (RIGHTEST GS550 BLOOD GLUCOSE) test strip 100 each 0    Sig: Use to check blood sugar daily as directed     Endocrinology: Diabetes - Testing Supplies Passed - 04/22/2023  2:12 PM      Passed - Valid encounter within last 12 months    Recent Outpatient Visits           1 month ago Type 2 diabetes mellitus with hyperglycemia, without long-term current use of insulin [facility])   [place] [facility] [place], [place], NP   10 months ago Type 2 diabetes mellitus with hyperglycemia, without long-term current use of insulin [facility])   [place] [facility] [place], [place] W, NP   1 year ago Type 2 diabetes mellitus with hyperglycemia, without long-term current use of insulin [facility])   [place] [facility] [place], [place], NP   1 year ago Type 2 diabetes mellitus with hyperglycemia, without long-term current use of insulin [facility])   [place] [facility] [place], [place], NP   2 years ago Type 2 diabetes mellitus with hyperglycemia, without long-term current use of insulin [facility])   [place] [facility] [place], Salvadore Oxford, NP       Future Appointments             In 4 months Baity, Salvadore Oxford, NP [facility], [facility]             Rightest GL300 Lancets MISC 100 each 0    Sig: Use to check blood sugar daily as directed     Endocrinology: Diabetes - Testing Supplies Passed - 04/22/2023  2:12 PM      Passed - Valid encounter within last 12 months    Recent Outpatient Visits           1 month ago Type 2 diabetes mellitus with hyperglycemia, without long-term current use of insulin [facility])   [place] [facility] [place], [place], NP   10 months ago Type 2 diabetes mellitus with hyperglycemia, without long-term current use of insulin [facility])   [place] [facility] [place], [place], NP    1 year ago Type 2 diabetes mellitus with hyperglycemia, without long-term current use of insulin [facility])   [place] [facility] [place], [place], NP   1 year ago Type 2 diabetes mellitus with hyperglycemia, without long-term current use of insulin [facility])   [place] [facility] [place], [place], NP   2 years ago Type 2 diabetes mellitus with hyperglycemia, without long-term current use of insulin [facility])   [place] [facility] [place], Salvadore Oxford, NP       Future Appointments             In 4 months Baity, Salvadore Oxford, NP [place] [facility], [facility]

## 2023-04-29 ENCOUNTER — Other Ambulatory Visit: Payer: Self-pay

## 2023-05-18 MED FILL — Levothyroxine Sodium Tab 100 MCG: ORAL | 90 days supply | Qty: 90 | Fill #3 | Status: AC

## 2023-05-19 ENCOUNTER — Other Ambulatory Visit: Payer: Self-pay

## 2023-05-23 ENCOUNTER — Other Ambulatory Visit: Payer: Self-pay

## 2023-05-24 ENCOUNTER — Other Ambulatory Visit: Payer: Self-pay | Admitting: Internal Medicine

## 2023-05-24 ENCOUNTER — Other Ambulatory Visit: Payer: Self-pay

## 2023-05-24 DIAGNOSIS — I10 Essential (primary) hypertension: Secondary | ICD-10-CM

## 2023-05-25 ENCOUNTER — Other Ambulatory Visit: Payer: Self-pay

## 2023-05-27 ENCOUNTER — Other Ambulatory Visit: Payer: Self-pay

## 2023-05-27 MED FILL — Carvedilol Tab 25 MG: ORAL | 90 days supply | Qty: 180 | Fill #0 | Status: AC

## 2023-05-27 NOTE — Telephone Encounter (Signed)
Requested Prescriptions  Pending Prescriptions Disp Refills   carvedilol (COREG) 25 MG tablet 180 tablet 0    Sig: Take 1 tablet (25 mg total) by mouth 2 (two) times daily with a meal.     Cardiovascular: Beta Blockers 3 Passed - 05/24/2023  2:13 PM      Passed - Cr in normal range and within 360 days    Creat  Date Value Ref Range Status  03/13/2018 0.91 0.60 - 1.35 mg/dL Final   Creatinine, Ser  Date Value Ref Range Status  02/25/2023 1.00 0.61 - 1.24 mg/dL Final         Passed - AST in normal range and within 360 days    AST  Date Value Ref Range Status  02/25/2023 31 15 - 41 U/L Final   SGOT(AST)  Date Value Ref Range Status  09/22/2012 28 15 - 37 Unit/L Final         Passed - ALT in normal range and within 360 days    ALT  Date Value Ref Range Status  02/25/2023 36 0 - 44 U/L Final   SGPT (ALT)  Date Value Ref Range Status  09/22/2012 31 12 - 78 U/L Final         Passed - Last BP in normal range    BP Readings from Last 1 Encounters:  02/25/23 126/78         Passed - Last Heart Rate in normal range    Pulse Readings from Last 1 Encounters:  02/25/23 75         Passed - Valid encounter within last 6 months    Recent Outpatient Visits           3 months ago Type 2 diabetes mellitus with hyperglycemia, without long-term current use of insulin [facility])   [place] [facility] [place], [place], NP   11 months ago Type 2 diabetes mellitus with hyperglycemia, without long-term current use of insulin [facility])   [place] [facility] [place], [place], NP   1 year ago Type 2 diabetes mellitus with hyperglycemia, without long-term current use of insulin [facility])   [place] [facility] [place], [place], NP   1 year ago Type 2 diabetes mellitus with hyperglycemia, without long-term current use of insulin [facility])   [place] [facility] [place], [place], NP   2 years ago Type 2 diabetes mellitus with  hyperglycemia, without long-term current use of insulin [facility])   [place] [facility] [place], Salvadore Oxford, NP       Future Appointments             In 3 months Baity, Salvadore Oxford, NP [place] [facility], [facility]

## 2023-06-18 ENCOUNTER — Other Ambulatory Visit: Payer: Self-pay

## 2023-06-18 MED FILL — Metformin HCl Tab 1000 MG: ORAL | 90 days supply | Qty: 180 | Fill #1 | Status: AC

## 2023-06-18 MED FILL — Olmesartan Medoxomil Tab 40 MG: ORAL | 90 days supply | Qty: 90 | Fill #1 | Status: AC

## 2023-06-30 ENCOUNTER — Other Ambulatory Visit: Payer: Self-pay

## 2023-06-30 ENCOUNTER — Other Ambulatory Visit: Payer: Self-pay | Admitting: Internal Medicine

## 2023-06-30 DIAGNOSIS — I10 Essential (primary) hypertension: Secondary | ICD-10-CM

## 2023-07-01 ENCOUNTER — Other Ambulatory Visit: Payer: Self-pay

## 2023-07-02 ENCOUNTER — Other Ambulatory Visit: Payer: Self-pay

## 2023-07-02 MED FILL — Potassium Chloride Microencapsulated Crys ER Tab 20 mEq: ORAL | 90 days supply | Qty: 180 | Fill #0 | Status: AC

## 2023-07-02 NOTE — Telephone Encounter (Signed)
Requested Prescriptions  Pending Prescriptions Disp Refills   potassium chloride SA (KLOR-CON M) 20 MEQ tablet 180 tablet 0    Sig: Take 1 tablet (20 mEq total) by mouth 2 (two) times daily.     Endocrinology:  Minerals - Potassium Supplementation Passed - 07/02/2023  8:30 AM      Passed - K in normal range and within 360 days    Potassium  Date Value Ref Range Status  02/25/2023 3.5 3.5 - 5.1 mmol/L Final  09/22/2012 3.9 3.5 - 5.1 mmol/L Final         Passed - Cr in normal range and within 360 days    Creat  Date Value Ref Range Status  03/13/2018 0.91 0.60 - 1.35 mg/dL Final   Creatinine, Ser  Date Value Ref Range Status  02/25/2023 1.00 0.61 - 1.24 mg/dL Final         Passed - Valid encounter within last 12 months    Recent Outpatient Visits           4 months ago Type 2 diabetes mellitus with hyperglycemia, without long-term current use of insulin [facility])   [place] [facility] [place], [place], NP   1 year ago Type 2 diabetes mellitus with hyperglycemia, without long-term current use of insulin [facility])   [place] [place], [place], NP   1 year ago Type 2 diabetes mellitus with hyperglycemia, without long-term current use of insulin [facility] [place] [facility])   [place] [facility] [place], [place], NP   2 years ago Type 2 diabetes mellitus with hyperglycemia, without long-term current use of insulin [facility])   [place] [facility] [place], [place], NP   2 years ago Type 2 diabetes mellitus with hyperglycemia, without long-term current use of insulin [facility])   [place] [facility] [place], Salvadore [place], NP       Future Appointments             In 2 months Baity, Salvadore [place], NP  [facility], [facility]

## 2023-07-22 ENCOUNTER — Other Ambulatory Visit: Payer: Self-pay | Admitting: Internal Medicine

## 2023-07-22 DIAGNOSIS — I10 Essential (primary) hypertension: Secondary | ICD-10-CM

## 2023-07-23 ENCOUNTER — Other Ambulatory Visit: Payer: Self-pay

## 2023-07-23 ENCOUNTER — Other Ambulatory Visit: Payer: Self-pay | Admitting: Internal Medicine

## 2023-07-23 DIAGNOSIS — I10 Essential (primary) hypertension: Secondary | ICD-10-CM

## 2023-07-25 ENCOUNTER — Other Ambulatory Visit: Payer: Self-pay

## 2023-07-26 ENCOUNTER — Other Ambulatory Visit: Payer: Self-pay

## 2023-07-26 MED FILL — Amlodipine Besylate Tab 10 MG (Base Equivalent): ORAL | 90 days supply | Qty: 90 | Fill #0 | Status: AC

## 2023-07-26 MED FILL — Fluticasone Propionate Nasal Susp 50 MCG/ACT: NASAL | 90 days supply | Qty: 48 | Fill #0 | Status: AC

## 2023-07-26 NOTE — Telephone Encounter (Signed)
 Requested Prescriptions  Pending Prescriptions Disp Refills   amLODipine  (NORVASC ) 10 MG tablet 90 tablet 0    Sig: Take 1 tablet (10 mg total) by mouth daily.     Cardiovascular: Calcium  Channel Blockers 2 Passed - 07/26/2023 11:00 AM      Passed - Last BP in normal range    BP Readings from Last 1 Encounters:  02/25/23 126/78         Passed - Last Heart Rate in normal range    Pulse Readings from Last 1 Encounters:  02/25/23 75         Passed - Valid encounter within last 6 months    Recent Outpatient Visits           5 months ago Type 2 diabetes mellitus with hyperglycemia, without long-term current use of insulin  [facility])   [place] [facility] [place], [place], NP   1 year ago Type 2 diabetes mellitus with hyperglycemia, without long-term current use of insulin  [facility])   [place] [facility] [place], [place], NP   1 year ago Type 2 diabetes mellitus with hyperglycemia, without long-term current use of insulin  [facility])   [place] [facility] [place], [place], NP   2 years ago Type 2 diabetes mellitus with hyperglycemia, without long-term current use of insulin  [facility])   [place] [facility] [place], [place], NP   2 years ago Type 2 diabetes mellitus with hyperglycemia, without long-term current use of insulin  [facility])   [place] [facility] [place], Angeline ORN, NP       Future Appointments             In 1 month [place], Angeline ORN, NP [facility]             fluticasone  (FLONASE ) 50 MCG/ACT nasal spray 48 g 0    Sig: Place 2 sprays into both nostrils daily.     Ear, Nose, and Throat: Nasal Preparations - Corticosteroids Passed - 07/26/2023 11:00 AM      Passed - Valid encounter within last 12 months    Recent Outpatient Visits           5 months ago Type 2 diabetes mellitus with hyperglycemia, without long-term current use of insulin  [facility])   [place] [facility] [place], [place], NP   1 year ago Type 2 diabetes mellitus with hyperglycemia, without long-term current use of insulin  [facility])   [place] [facility] [place], [place], NP   1 year ago Type 2 diabetes mellitus with hyperglycemia, without long-term current use of insulin  [facility])   [place] [facility] [place], [place], NP   2 years ago Type 2 diabetes mellitus with hyperglycemia, without long-term current use of insulin  [facility])   [place] [place], [place], NP   2 years ago Type 2 diabetes mellitus with hyperglycemia, without long-term current use of insulin  [facility])   [place] [facility] [place], Angeline ORN, NP       Future Appointments             In 1 month Baity, Angeline ORN, NP [place] [facility], [facility]

## 2023-08-18 ENCOUNTER — Other Ambulatory Visit: Payer: Self-pay | Admitting: Internal Medicine

## 2023-08-18 DIAGNOSIS — E039 Hypothyroidism, unspecified: Secondary | ICD-10-CM

## 2023-08-19 ENCOUNTER — Other Ambulatory Visit: Payer: Self-pay | Admitting: Internal Medicine

## 2023-08-19 ENCOUNTER — Other Ambulatory Visit: Payer: Self-pay

## 2023-08-19 DIAGNOSIS — E039 Hypothyroidism, unspecified: Secondary | ICD-10-CM

## 2023-08-20 ENCOUNTER — Other Ambulatory Visit: Payer: Self-pay

## 2023-08-20 MED FILL — Rosuvastatin Calcium Tab 20 MG: ORAL | 90 days supply | Qty: 90 | Fill #0 | Status: AC

## 2023-08-20 MED FILL — Levothyroxine Sodium Tab 100 MCG: ORAL | 90 days supply | Qty: 90 | Fill #0 | Status: AC

## 2023-08-20 NOTE — Telephone Encounter (Signed)
 Requested Prescriptions  Pending Prescriptions Disp Refills   levothyroxine  (SYNTHROID ) 100 MCG tablet 90 tablet 0    Sig: TAKE ONE TABLET BY MOUTH EVERY DAY     Endocrinology:  Hypothyroid Agents Failed - 08/20/2023  4:24 PM      Failed - TSH in normal range and within 360 days    TSH  Date Value Ref Range Status  06/19/2022 2.527 0.350 - 4.500 uIU/mL Final    Comment:    Performed by a 3rd Generation assay with a functional sensitivity of <=0.01 uIU/mL. Performed at [facility], [address] [place], [place] [address]   03/13/2018 5.25 (H) 0.40 - 4.50 mIU/L Final         Passed - Valid encounter within last 12 months    Recent Outpatient Visits           5 months ago Type 2 diabetes mellitus with hyperglycemia, without long-term current use of insulin  [facility])   [facility] [place], [place], NP   1 year ago Type 2 diabetes mellitus with hyperglycemia, without long-term current use of insulin  [facility])   [place] [facility] [place], [place], NP   1 year ago Type 2 diabetes mellitus with hyperglycemia, without long-term current use of insulin  [facility])   [place] [facility] [place], [place], NP   2 years ago Type 2 diabetes mellitus with hyperglycemia, without long-term current use of insulin  [facility])   [place] [facility] [place], [place], NP   2 years ago Type 2 diabetes mellitus with hyperglycemia, without long-term current use of insulin  [facility])   [place] [place], Angeline ORN, NP       Future Appointments             In 1 week Antonette Angeline ORN, NP [place] [facility]             rosuvastatin  (CRESTOR ) 20 MG tablet 90 tablet 0    Sig: Take 1 tablet (20 mg total) by mouth daily.     Cardiovascular:  Antilipid - Statins 2 Failed - 08/20/2023  4:24 PM      Failed - Lipid Panel in normal range within the last 12 months    Cholesterol  Date  Value Ref Range Status  02/25/2023 101 0 - 200 mg/dL Final   LDL Cholesterol (Calc)  Date Value Ref Range Status  03/13/2018 65 mg/dL (calc) Final    Comment:    Reference range: <100 . Desirable range <100 mg/dL for primary prevention;   <70 mg/dL for patients with CHD or diabetic patients  with > or = 2 CHD risk factors. SABRA LDL-C is now calculated using the Martin-Hopkins  calculation, which is a validated novel method providing  better accuracy than the Friedewald equation in the  estimation of LDL-C.  Gladis APPLETHWAITE et al. SANDREA. 7986;689(80): 2061-2068  (http://education.[employer].com/faq/FAQ164)    LDL Cholesterol  Date Value Ref Range Status  02/25/2023 29 0 - 99 mg/dL Final    Comment:           Total Cholesterol/HDL:CHD Risk Coronary Heart Disease Risk Table                     Men   Women  1/2 Average Risk   3.4   3.3  Average Risk       5.0   4.4  2 X Average Risk   9.6   7.1  3 X Average Risk  23.4   11.0        Use the calculated Patient Ratio above and the CHD Risk Table to determine the patient's CHD Risk.        ATP III CLASSIFICATION (LDL):  <100     mg/dL   Optimal  899-870  mg/dL   Near or Above                    Optimal  130-159  mg/dL   Borderline  839-810  mg/dL   High  >809     mg/dL   Very High Performed at [facility], [address] [place], [place] [address]    HDL  Date Value Ref Range Status  02/25/2023 29 (L) >40 mg/dL Final   Triglycerides  Date Value Ref Range Status  02/25/2023 215 (H) <150 mg/dL Final         Passed - Cr in normal range and within 360 days    Creat  Date Value Ref Range Status  03/13/2018 0.91 0.60 - 1.35 mg/dL Final   Creatinine, Ser  Date Value Ref Range Status  02/25/2023 1.00 0.61 - 1.24 mg/dL Final         Passed - Patient is not pregnant      Passed - Valid encounter within last 12 months    Recent Outpatient Visits           5 months ago Type 2 diabetes mellitus with  hyperglycemia, without long-term current use of insulin  [facility])   [facility] [place], [place], NP   1 year ago Type 2 diabetes mellitus with hyperglycemia, without long-term current use of insulin  [facility])   [place] [facility] [place], [place], NP   1 year ago Type 2 diabetes mellitus with hyperglycemia, without long-term current use of insulin  [facility])   [place] [facility] [place], [place], NP   2 years ago Type 2 diabetes mellitus with hyperglycemia, without long-term current use of insulin  [facility])   [facility] [place], [place], NP   2 years ago Type 2 diabetes mellitus with hyperglycemia, without long-term current use of insulin  [facility])   [place] [place], Angeline ORN, NP       Future Appointments             In 1 week Antonette, Angeline ORN, NP  [facility]

## 2023-08-22 ENCOUNTER — Other Ambulatory Visit: Payer: Self-pay

## 2023-08-25 ENCOUNTER — Other Ambulatory Visit: Payer: Self-pay | Admitting: Internal Medicine

## 2023-08-25 DIAGNOSIS — I10 Essential (primary) hypertension: Secondary | ICD-10-CM

## 2023-08-26 ENCOUNTER — Other Ambulatory Visit: Payer: Self-pay | Admitting: Internal Medicine

## 2023-08-26 ENCOUNTER — Other Ambulatory Visit: Payer: Self-pay

## 2023-08-26 DIAGNOSIS — I10 Essential (primary) hypertension: Secondary | ICD-10-CM

## 2023-08-27 ENCOUNTER — Other Ambulatory Visit: Payer: Self-pay

## 2023-08-27 MED FILL — Amlodipine Besylate Tab 10 MG (Base Equivalent): ORAL | 90 days supply | Qty: 90 | Fill #0 | Status: CN

## 2023-08-27 MED FILL — Carvedilol Tab 25 MG: ORAL | 90 days supply | Qty: 180 | Fill #0 | Status: AC

## 2023-08-27 NOTE — Telephone Encounter (Signed)
Criteria met.  LOV 02/25/2023.   Has appt on 09/02/2023 with Eula Flax.   90 day supply given.  Requested Prescriptions  Pending Prescriptions Disp Refills   amLODipine (NORVASC) 10 MG tablet 90 tablet 0    Sig: Take 1 tablet (10 mg total) by mouth daily.     Cardiovascular: Calcium Channel Blockers 2 Failed - 08/27/2023  1:15 PM      Failed - Valid encounter within last 6 months    Recent Outpatient Visits           6 months ago Type 2 diabetes mellitus with hyperglycemia, without long-term current use of insulin [facility])   [place] [facility] [place], [place], NP   1 year ago Type 2 diabetes mellitus with hyperglycemia, without long-term current use of insulin [facility])   [place] [facility] [place], [place], NP   1 year ago Type 2 diabetes mellitus with hyperglycemia, without long-term current use of insulin [facility])   [place] [facility] [place], [place], NP   2 years ago Type 2 diabetes mellitus with hyperglycemia, without long-term current use of insulin [facility])   [place] [facility] [place], [place], NP   2 years ago Type 2 diabetes mellitus with hyperglycemia, without long-term current use of insulin (HCC)   [place] [facility] [place], Salvadore [place], NP       Future Appointments             In 6 days [place], Salvadore [place], NP [place]            Passed - Last BP in normal range    BP Readings from Last 1 Encounters:  02/25/23 126/78         Passed - Last Heart Rate in normal range    Pulse Readings from Last 1 Encounters:  02/25/23 75          carvedilol (COREG) 25 MG tablet 180 tablet 0    Sig: Take 1 tablet (25 mg total) by mouth 2 (two) times daily with a meal.     Cardiovascular: Beta Blockers 3 Failed - 08/27/2023  1:15 PM      Failed - Valid encounter within last 6 months    Recent Outpatient Visits           6 months ago Type 2 diabetes  mellitus with hyperglycemia, without long-term current use of insulin [facility])   [place] [facility] [place], [place], NP   1 year ago Type 2 diabetes mellitus with hyperglycemia, without long-term current use of insulin [facility])   [place] [facility] [place], [place], NP   1 year ago Type 2 diabetes mellitus with hyperglycemia, without long-term current use of insulin [facility])   [place] [place], [place], NP   2 years ago Type 2 diabetes mellitus with hyperglycemia, without long-term current use of insulin [facility])   [place] [facility] [place], [place], NP   2 years ago Type 2 diabetes mellitus with hyperglycemia, without long-term current use of insulin [facility])    [facility] [place], Salvadore [place], NP       Future Appointments             In 6 days [place], Salvadore [place], NP  [facility]  Passed - Cr in normal range and within 360 days    Creat  Date Value Ref Range Status  03/13/2018 0.91 0.60 - 1.35 mg/dL Final   Creatinine, Ser  Date Value Ref Range Status  02/25/2023 1.00 0.61 - 1.24 mg/dL Final         Passed - AST in normal range and within 360 days    AST  Date Value Ref Range Status  02/25/2023 31 15 - 41 U/L Final   SGOT(AST)  Date Value Ref Range Status  09/22/2012 28 15 - 37 Unit/L Final         Passed - ALT in normal range and within 360 days    ALT  Date Value Ref Range Status  02/25/2023 36 0 - 44 U/L Final   SGPT (ALT)  Date Value Ref Range Status  09/22/2012 31 12 - 78 U/L Final         Passed - Last BP in normal range    BP Readings from Last 1 Encounters:  02/25/23 126/78         Passed - Last Heart Rate in normal range    Pulse Readings from Last 1 Encounters:  02/25/23 75

## 2023-08-31 ENCOUNTER — Other Ambulatory Visit: Payer: Self-pay

## 2023-09-02 ENCOUNTER — Encounter: Payer: Self-pay | Admitting: Internal Medicine

## 2023-09-02 ENCOUNTER — Other Ambulatory Visit: Payer: Self-pay

## 2023-09-02 ENCOUNTER — Other Ambulatory Visit
Admission: RE | Admit: 2023-09-02 | Discharge: 2023-09-02 | Disposition: A | Discharge status: Home / Self Care | Payer: Self-pay | Source: Ambulatory Visit | Attending: Internal Medicine | Admitting: Internal Medicine

## 2023-09-02 ENCOUNTER — Ambulatory Visit: Payer: Self-pay | Admitting: Internal Medicine

## 2023-09-02 VITALS — BP 138/78 | Ht 67.0 in | Wt 227.4 lb

## 2023-09-02 DIAGNOSIS — E66812 Obesity, class 2: Secondary | ICD-10-CM

## 2023-09-02 DIAGNOSIS — I700 Atherosclerosis of aorta: Secondary | ICD-10-CM

## 2023-09-02 DIAGNOSIS — E039 Hypothyroidism, unspecified: Secondary | ICD-10-CM

## 2023-09-02 DIAGNOSIS — E785 Hyperlipidemia, unspecified: Secondary | ICD-10-CM

## 2023-09-02 DIAGNOSIS — G4733 Obstructive sleep apnea (adult) (pediatric): Secondary | ICD-10-CM

## 2023-09-02 DIAGNOSIS — E1165 Type 2 diabetes mellitus with hyperglycemia: Secondary | ICD-10-CM

## 2023-09-02 DIAGNOSIS — E1169 Type 2 diabetes mellitus with other specified complication: Secondary | ICD-10-CM

## 2023-09-02 DIAGNOSIS — Z85528 Personal history of other malignant neoplasm of kidney: Secondary | ICD-10-CM

## 2023-09-02 DIAGNOSIS — I2510 Atherosclerotic heart disease of native coronary artery without angina pectoris: Secondary | ICD-10-CM

## 2023-09-02 DIAGNOSIS — I10 Essential (primary) hypertension: Secondary | ICD-10-CM

## 2023-09-02 DIAGNOSIS — D509 Iron deficiency anemia, unspecified: Secondary | ICD-10-CM

## 2023-09-02 LAB — LIPID PANEL
Cholesterol: 89 mg/dL (ref 0–200)
HDL: 28 mg/dL — ABNORMAL LOW (ref >40)
LDL Cholesterol: 28 mg/dL (ref 0–99)
Total CHOL/HDL Ratio: 3.2 {ratio}
Triglycerides: 163 mg/dL — ABNORMAL HIGH (ref <150)
VLDL: 33 mg/dL (ref 0–40)

## 2023-09-02 LAB — COMPREHENSIVE METABOLIC PANEL
ALT: 26 U/L (ref 0–44)
AST: 29 U/L (ref 15–41)
Albumin: 4.1 g/dL (ref 3.5–5.0)
Alkaline Phosphatase: 54 U/L (ref 38–126)
Anion gap: 11 (ref 5–15)
BUN: 21 mg/dL — ABNORMAL HIGH (ref 6–20)
CO2: 27 mmol/L (ref 22–32)
Calcium: 9.7 mg/dL (ref 8.9–10.3)
Chloride: 101 mmol/L (ref 98–111)
Creatinine, Ser: 1.26 mg/dL — ABNORMAL HIGH (ref 0.61–1.24)
GFR, Estimated: >60 mL/min (ref >60)
Glucose, Bld: 110 mg/dL — ABNORMAL HIGH (ref 70–99)
Potassium: 3.2 mmol/L — ABNORMAL LOW (ref 3.5–5.1)
Sodium: 139 mmol/L (ref 135–145)
Total Bilirubin: 0.7 mg/dL (ref 0.0–1.2)
Total Protein: 8.2 g/dL — ABNORMAL HIGH (ref 6.5–8.1)

## 2023-09-02 LAB — CBC
HCT: 35.7 % — ABNORMAL LOW (ref 39.0–52.0)
Hemoglobin: 11.3 g/dL — ABNORMAL LOW (ref 13.0–17.0)
MCH: 23.6 pg — ABNORMAL LOW (ref 26.0–34.0)
MCHC: 31.7 g/dL (ref 30.0–36.0)
MCV: 74.7 fL — ABNORMAL LOW (ref 80.0–100.0)
Platelets: 283 10*3/uL (ref 150–400)
RBC: 4.78 MIL/uL (ref 4.22–5.81)
RDW: 15.5 % (ref 11.5–15.5)
WBC: 9.6 10*3/uL (ref 4.0–10.5)
nRBC: 0 % (ref 0.0–0.2)

## 2023-09-02 LAB — TSH: TSH: 5.583 u[IU]/mL — ABNORMAL HIGH (ref 0.350–4.500)

## 2023-09-02 LAB — HEMOGLOBIN A1C
Hgb A1c MFr Bld: 6.4 % — ABNORMAL HIGH (ref 4.8–5.6)
Mean Plasma Glucose: 136.98 mg/dL

## 2023-09-02 LAB — T4, FREE: Free T4: 1.1 ng/dL (ref 0.61–1.12)

## 2023-09-02 NOTE — Progress Notes (Signed)
Subjective:    Patient ID: Charles Mathis, male    DOB: 1970/12/18, 53 y.o.   MRN: [id number]  HPI  Patient presents in clinic today for follow-up of chronic conditions.  HTN: His BP today is 138/78.  He is taking amlodipine, carvedilol, chlorthalidone and olmesartan as prescribed.  ECG from 01/2019 reviewed.  HLD with CAD, aortic atherosclerosis: His last LDL was 29, triglycerides 409, 02/2023.  He denies myalgias on rosuvastatin.  He is taking aspirin as well.  He does not consume a low-fat diet.  DM2: His last A1c was 6.5%, 02/2023.  He is taking metformin as prescribed.  He does not check his sugars.  He does not check his feet routinely.  His last eye exam was 07/2022, [facility].  Flu never.  Pneumovax never.  COVID x 2.  Hypothyroidism: He denies any issues on his current dose of levothyroxine.  He does not follow with endocrinology.  OSA: He averages 8 hours of sleep per night without the use of CPAP.  There is no sleep study on file.  Anemia: His last H/H was 11/35, 02/2023.  He is not taking any oral iron at this time.  He does not follow with hematology.  Review of Systems     Past Medical History:  Diagnosis Date   Allergy    Diabetes mellitus without complication (HCC)    Hypertension    Renal cancer, left (HCC) 01/2019   Left Nephrectomy    Current Outpatient Medications  Medication Sig Dispense Refill   acetaminophen (TYLENOL) 325 MG tablet Take 650 mg by mouth every 6 (six) hours as needed for headache.     amLODipine (NORVASC) 10 MG tablet Take 1 tablet (10 mg total) by mouth daily. 90 tablet 0   aspirin EC 81 MG tablet Take 81 mg by mouth daily.     blood glucose meter kit and supplies Dispense based on patient and insurance preference. Use to check blood sugar daily as directed. DX: E11.9. 1 each 0   carvedilol (COREG) 25 MG tablet Take 1 tablet (25 mg total) by mouth 2 (two) times daily with a meal. 180 tablet 0   chlorthalidone (HYGROTON) 25 MG tablet Take  0.5 tablets (12.5 mg total) by mouth daily. 45 tablet 1   fluticasone (FLONASE) 50 MCG/ACT nasal spray Place 2 sprays into both nostrils daily. 48 g 0   glucose blood (RIGHTEST GS550 BLOOD GLUCOSE) test strip Use to check blood sugar daily as directed 100 each 0   levothyroxine (SYNTHROID) 100 MCG tablet TAKE ONE TABLET BY MOUTH EVERY DAY 90 tablet 0   metFORMIN (GLUCOPHAGE) 1000 MG tablet Take 1 tablet (1,000 mg total) by mouth 2 (two) times daily with a meal. 180 tablet 1   montelukast (SINGULAIR) 10 MG tablet Take 1 tablet (10 mg total) by mouth once daily. 90 tablet 1   olmesartan (BENICAR) 40 MG tablet Take 1 tablet (40 mg total) by mouth daily. 90 tablet 1   potassium chloride SA (KLOR-CON M) 20 MEQ tablet Take 1 tablet (20 mEq total) by mouth 2 (two) times daily. 180 tablet 0   Lancets 33G MISC Use as directed to check blood sugar daily. 100 each 0   rosuvastatin (CRESTOR) 20 MG tablet Take 1 tablet (20 mg total) by mouth daily. 90 tablet 0   No current facility-administered medications for this visit.    No Known Allergies  Family History  Problem Relation Age of Onset   Diabetes Mother  Heart disease Mother    Mental illness Mother    Thyroid disease Mother    Heart disease Father    Diabetes Father    Mental illness Father     Social History   Socioeconomic History   Marital status: Married    Spouse name: Not on file   Number of children: Not on file   Years of education: Not on file   Highest education level: Not on file  Occupational History   Not on file  Tobacco Use   Smoking status: Never   Smokeless tobacco: Never  Vaping Use   Vaping status: Never Used  Substance and Sexual Activity   Alcohol use: No   Drug use: No   Sexual activity: Yes    Birth control/protection: Other-see comments    Comment: mutually monogamous relationship partner postmenopausal  Other Topics Concern   Not on file  Social History Narrative   In [place]/ with wife; son  [downs syndrome]; no smoking/ no alcohol; works for [employer], [employer].    Social Drivers of Corporate investment banker Strain: Not on file  Food Insecurity: Not on file  Transportation Needs: Not on file  Physical Activity: Not on file  Stress: Not on file  Social Connections: Not on file  Intimate Partner Violence: Not on file     Constitutional: Denies fever, malaise, fatigue, headache or abrupt weight changes.  HEENT: Denies eye pain, eye redness, ear pain, ringing in the ears, wax buildup, runny nose, nasal congestion, bloody nose, or sore throat. Respiratory: Denies difficulty breathing, shortness of breath, cough or sputum production.   Cardiovascular: Patient reports intermittent swelling in legs.  Denies chest pain, chest tightness, palpitations or swelling in the hands.  Gastrointestinal: Denies abdominal pain, bloating, constipation, diarrhea or blood in the stool.  GU: Denies urgency, frequency, pain with urination, burning sensation, blood in urine, odor or discharge. Musculoskeletal: Denies decrease in range of motion, difficulty with gait, muscle pain or joint pain and swelling.  Skin: Denies redness, rashes, lesions or ulcercations.  Neurological: Denies dizziness, difficulty with memory, difficulty with speech or problems with balance and coordination.  Psych: Denies anxiety, depression, SI/HI.  No other specific complaints in a complete review of systems (except as listed in HPI above).  Objective:   Physical Exam BP 138/78 (BP Location: Left Arm, Patient Position: Sitting, Cuff Size: Large)   Ht 5\' 7"  (1.702 m)   Wt 227 lb 6.4 oz (103.1 kg)   BMI 35.62 kg/m   Wt Readings from Last 3 Encounters:  02/25/23 238 lb (108 kg)  06/19/22 233 lb (105.7 kg)  06/22/21 257 lb 3.2 oz (116.7 kg)    General: Appears his stated age, obese, in NAD. Skin: Warm, dry and intact.   No ulcerations noted. HEENT: Head: normal shape and size; Eyes: sclera white, no icterus,  conjunctiva pink, PERRLA and EOMs intact;  Cardiovascular: Normal rate and rhythm. S1,S2 noted.  No murmur, rubs or gallops noted. No JVD or BLE edema. No carotid bruits noted. Pulmonary/Chest: Normal effort and positive vesicular breath sounds. No respiratory distress. No wheezes, rales or ronchi noted.  Musculoskeletal: No difficulty with gait.  Neurological: Alert and oriented. Coordination normal.  Psychiatric: Mood and affect normal. Behavior is normal. Judgment and thought content normal.    BMET    Component Value Date/Time   NA 140 02/25/2023 1046   NA 143 01/24/2017 1306   NA 139 09/22/2012 1156   K 3.5 02/25/2023 1046  K 3.9 09/22/2012 1156   CL 103 02/25/2023 1046   CL 106 09/22/2012 1156   CO2 24 02/25/2023 1046   CO2 29 09/22/2012 1156   GLUCOSE 119 (H) 02/25/2023 1046   GLUCOSE 96 09/22/2012 1156   BUN 21 (H) 02/25/2023 1046   BUN 20 01/24/2017 1306   BUN 14 09/22/2012 1156   CREATININE 1.00 02/25/2023 1046   CREATININE 0.91 03/13/2018 0823   CALCIUM 9.0 02/25/2023 1046   CALCIUM 8.6 09/22/2012 1156   GFRNONAA >60 02/25/2023 1046   GFRNONAA 100 03/13/2018 0823   GFRAA >60 04/15/2020 0947   GFRAA 116 03/13/2018 0823    Lipid Panel     Component Value Date/Time   CHOL 101 02/25/2023 1046   TRIG 215 (H) 02/25/2023 1046   HDL 29 (L) 02/25/2023 1046   CHOLHDL 3.5 02/25/2023 1046   VLDL 43 (H) 02/25/2023 1046   LDLCALC 29 02/25/2023 1046   LDLCALC 65 03/13/2018 0823    CBC    Component Value Date/Time   WBC 7.4 02/25/2023 1046   RBC 4.50 02/25/2023 1046   HGB 11.0 (L) 02/25/2023 1046   HGB 12.5 (L) 09/22/2012 1156   HCT 35.0 (L) 02/25/2023 1046   HCT 38.1 (L) 09/22/2012 1156   PLT 290 02/25/2023 1046   PLT 275 09/22/2012 1156   MCV 77.8 (L) 02/25/2023 1046   MCV 81 09/22/2012 1156   MCH 24.4 (L) 02/25/2023 1046   MCHC 31.4 02/25/2023 1046   RDW 15.2 02/25/2023 1046   RDW 15.0 (H) 09/22/2012 1156   LYMPHSABS 1.8 12/16/2020 0831   MONOABS 0.8  12/16/2020 0831   EOSABS 0.6 (H) 12/16/2020 0831   BASOSABS 0.0 12/16/2020 0831    Hgb A1C Lab Results  Component Value Date   HGBA1C 6.5 (A) 02/25/2023          Assessment & Plan:    RTC in 6 months for your annual exam Nicki Reaper, NP

## 2023-09-02 NOTE — Patient Instructions (Signed)

## 2023-09-02 NOTE — Assessment & Plan Note (Signed)
 CBC today.

## 2023-09-02 NOTE — Assessment & Plan Note (Signed)
In remission CMET today

## 2023-09-02 NOTE — Assessment & Plan Note (Signed)
No angina C-Met and lipid profile today Encouraged him to consume a low-fat diet Continue carvedilol, rosuvastatin and aspirin

## 2023-09-02 NOTE — Assessment & Plan Note (Signed)
C-Met and lipid profile today Encouraged him to consume a low-fat diet Continue rosuvastatin and aspirin

## 2023-09-02 NOTE — Assessment & Plan Note (Signed)
C-Met and lipid profile today Encouraged him to consume a low-fat diet Continue rosuvastatin 

## 2023-09-02 NOTE — Assessment & Plan Note (Signed)
Encourage weight loss as this can help reduce sleep apnea symptoms Noncompliant with CPAP

## 2023-09-02 NOTE — Assessment & Plan Note (Signed)
 TSH and free T4 reviewed Continue levothyroxine

## 2023-09-02 NOTE — Assessment & Plan Note (Signed)
 Encourage diet and exercise for weight loss

## 2023-09-02 NOTE — Assessment & Plan Note (Signed)
Controlled on amlodipine, carvedilol, chlorthalidone and olmesartan Reinforced DASH diet and exercise for weight loss C-Met today

## 2023-09-02 NOTE — Assessment & Plan Note (Signed)
POCT A1c 6.5% We will check urine microalbumin Encouraged him to consume a low-carb diet and exercise for weight loss Continue metformin Encourage routine eye exam Encouraged routine foot exam He declines immunizations for financial reasons

## 2023-09-03 ENCOUNTER — Other Ambulatory Visit: Payer: Self-pay

## 2023-09-03 LAB — MICROALBUMIN / CREATININE URINE RATIO
Creatinine, Urine: 135.9 mg/dL
Microalb Creat Ratio: 102 mg/g{creat} — ABNORMAL HIGH (ref 0–29)
Microalb, Ur: 138.4 ug/mL — ABNORMAL HIGH

## 2023-09-16 ENCOUNTER — Other Ambulatory Visit: Payer: Self-pay | Admitting: Internal Medicine

## 2023-09-16 DIAGNOSIS — I10 Essential (primary) hypertension: Secondary | ICD-10-CM

## 2023-09-17 ENCOUNTER — Other Ambulatory Visit: Payer: Self-pay | Admitting: Internal Medicine

## 2023-09-17 ENCOUNTER — Other Ambulatory Visit: Payer: Self-pay

## 2023-09-17 DIAGNOSIS — I10 Essential (primary) hypertension: Secondary | ICD-10-CM

## 2023-09-18 ENCOUNTER — Other Ambulatory Visit: Payer: Self-pay

## 2023-09-18 MED FILL — Olmesartan Medoxomil Tab 40 MG: ORAL | 90 days supply | Qty: 90 | Fill #0 | Status: AC

## 2023-09-18 MED FILL — Chlorthalidone Tab 25 MG: ORAL | 90 days supply | Qty: 45 | Fill #0 | Status: AC

## 2023-09-18 MED FILL — Metformin HCl Tab 1000 MG: ORAL | 90 days supply | Qty: 180 | Fill #0 | Status: AC

## 2023-09-18 NOTE — Telephone Encounter (Signed)
 Requested Prescriptions  Pending Prescriptions Disp Refills   chlorthalidone (HYGROTON) 25 MG tablet 45 tablet 1    Sig: Take 0.5 tablets (12.5 mg total) by mouth daily.     Cardiovascular: Diuretics - Thiazide Failed - 09/18/2023  1:03 PM      Failed - Cr in normal range and within 180 days    Creat  Date Value Ref Range Status  03/13/2018 0.91 0.60 - 1.35 mg/dL Final   Creatinine, Ser  Date Value Ref Range Status  09/02/2023 1.26 (H) 0.61 - 1.24 mg/dL Final         Failed - K in normal range and within 180 days    Potassium  Date Value Ref Range Status  09/02/2023 3.2 (L) 3.5 - 5.1 mmol/L Final  09/22/2012 3.9 3.5 - 5.1 mmol/L Final         Failed - Valid encounter within last 6 months    Recent Outpatient Visits           6 months ago Type 2 diabetes mellitus with hyperglycemia, without long-term current use of insulin (HCC)   [facility] [place], Salvadore Oxford, NP   1 year ago Type 2 diabetes mellitus with hyperglycemia, without long-term current use of insulin (HCC)   [place] [facility] [place], [place] W, NP   1 year ago Type 2 diabetes mellitus with hyperglycemia, without long-term current use of insulin (HCC)   [place] [facility] [place], [place] W, NP   2 years ago Type 2 diabetes mellitus with hyperglycemia, without long-term current use of insulin (HCC)   [facility] [place], [place] W, NP   2 years ago Type 2 diabetes mellitus with hyperglycemia, without long-term current use of insulin (HCC)   [place] [facility] [place], Salvadore Oxford, NP       Future Appointments             In 5 months [place], Salvadore Oxford, NP [place] [facility], [facility]            Passed - Na in normal range and within 180 days    Sodium  Date Value Ref Range Status  09/02/2023 139 135 - 145 mmol/L Final  01/24/2017 143 134 - 144 mmol/L Final  09/22/2012 139 136 - 145 mmol/L  Final         Passed - Last BP in normal range    BP Readings from Last 1 Encounters:  09/02/23 138/78          metFORMIN (GLUCOPHAGE) 1000 MG tablet 180 tablet 1    Sig: Take 1 tablet (1,000 mg total) by mouth 2 (two) times daily with a meal.     Endocrinology:  Diabetes - Biguanides Failed - 09/18/2023  1:03 PM      Failed - Cr in normal range and within 360 days    Creat  Date Value Ref Range Status  03/13/2018 0.91 0.60 - 1.35 mg/dL Final   Creatinine, Ser  Date Value Ref Range Status  09/02/2023 1.26 (H) 0.61 - 1.24 mg/dL Final         Failed - B12 Level in normal range and within 720 days    No results found for: "VITAMINB12"       Failed - Valid encounter within last 6 months    Recent Outpatient Visits           6 months  ago Type 2 diabetes mellitus with hyperglycemia, without long-term current use of insulin (HCC)   [place] [facility] [place], [place], NP   1 year ago Type 2 diabetes mellitus with hyperglycemia, without long-term current use of insulin [facility])   [facility] [place], [place], NP   1 year ago Type 2 diabetes mellitus with hyperglycemia, without long-term current use of insulin [facility])   [place] [facility] [place], [place] W, NP   2 years ago Type 2 diabetes mellitus with hyperglycemia, without long-term current use of insulin (HCC)   [place] [facility] [place], [place] W, NP   2 years ago Type 2 diabetes mellitus with hyperglycemia, without long-term current use of insulin (HCC)   [place] [facility] [place], Salvadore Oxford, NP       Future Appointments             In 5 months Baity, Salvadore Oxford, NP [place] [facility], [facility]            Failed - CBC within normal limits and completed in the last 12 months    WBC  Date Value Ref Range Status  09/02/2023 9.6 4.0 - 10.5 K/uL Final   RBC  Date Value Ref Range Status  09/02/2023 4.78  4.22 - 5.81 MIL/uL Final   Hemoglobin  Date Value Ref Range Status  09/02/2023 11.3 (L) 13.0 - 17.0 g/dL Final   HGB  Date Value Ref Range Status  09/22/2012 12.5 (L) 13.0 - 18.0 g/dL Final   HCT  Date Value Ref Range Status  09/02/2023 35.7 (L) 39.0 - 52.0 % Final  09/22/2012 38.1 (L) 40.0 - 52.0 % Final   MCHC  Date Value Ref Range Status  09/02/2023 31.7 30.0 - 36.0 g/dL Final   [facility]  Date Value Ref Range Status  09/02/2023 23.6 (L) 26.0 - 34.0 pg Final   MCV  Date Value Ref Range Status  09/02/2023 74.7 (L) 80.0 - 100.0 fL Final  09/22/2012 81 80 - 100 fL Final   No results found for: "PLTCOUNTKUC", "LABPLAT", "POCPLA" RDW  Date Value Ref Range Status  09/02/2023 15.5 11.5 - 15.5 % Final  09/22/2012 15.0 (H) 11.5 - 14.5 % Final         Passed - HBA1C is between 0 and 7.9 and within 180 days    Hgb A1c MFr Bld  Date Value Ref Range Status  09/02/2023 6.4 (H) 4.8 - 5.6 % Final    Comment:    (NOTE) Pre diabetes:          5.7%-6.4%  Diabetes:              >6.4%  Glycemic control for   <7.0% adults with diabetes          Passed - eGFR in normal range and within 360 days    GFR, Est African American  Date Value Ref Range Status  03/13/2018 116 > OR = 60 mL/min/1.45m2 Final   GFR calc Af Amer  Date Value Ref Range Status  04/15/2020 >60 >60 mL/min Final   GFR, Est Non African American  Date Value Ref Range Status  03/13/2018 100 > OR = 60 mL/min/1.55m2 Final   GFR, Estimated  Date Value Ref Range Status  09/02/2023 >60 >60 mL/min Final    Comment:    (NOTE) Calculated using the CKD-EPI Creatinine Equation (2021)  olmesartan (BENICAR) 40 MG tablet 90 tablet 1    Sig: Take 1 tablet (40 mg total) by mouth daily.     Cardiovascular:  Angiotensin Receptor Blockers Failed - 09/18/2023  1:03 PM      Failed - Cr in normal range and within 180 days    Creat  Date Value Ref Range Status  03/13/2018 0.91 0.60 - 1.35 mg/dL Final    Creatinine, Ser  Date Value Ref Range Status  09/02/2023 1.26 (H) 0.61 - 1.24 mg/dL Final         Failed - K in normal range and within 180 days    Potassium  Date Value Ref Range Status  09/02/2023 3.2 (L) 3.5 - 5.1 mmol/L Final  09/22/2012 3.9 3.5 - 5.1 mmol/L Final         Failed - Valid encounter within last 6 months    Recent Outpatient Visits           6 months ago Type 2 diabetes mellitus with hyperglycemia, without long-term current use of insulin [facility])   [facility] [place], [place], NP   1 year ago Type 2 diabetes mellitus with hyperglycemia, without long-term current use of insulin [facility])   [place] [facility] [place], [place] W, NP   1 year ago Type 2 diabetes mellitus with hyperglycemia, without long-term current use of insulin [facility])   [place] [facility] [place], [place] W, NP   2 years ago Type 2 diabetes mellitus with hyperglycemia, without long-term current use of insulin [facility])   [place] [place], [place] W, NP   2 years ago Type 2 diabetes mellitus with hyperglycemia, without long-term current use of insulin [facility])   [place] [facility] [place], Salvadore Oxford, NP       Future Appointments             In 5 months Baity, Salvadore Oxford, NP [place] [facility], [facility]            Passed - Patient is not pregnant      Passed - Last BP in normal range    BP Readings from Last 1 Encounters:  09/02/23 138/78

## 2023-09-23 ENCOUNTER — Other Ambulatory Visit: Payer: Self-pay

## 2023-09-23 ENCOUNTER — Other Ambulatory Visit: Payer: Self-pay | Admitting: Internal Medicine

## 2023-09-24 ENCOUNTER — Other Ambulatory Visit: Payer: Self-pay | Admitting: Internal Medicine

## 2023-09-24 ENCOUNTER — Other Ambulatory Visit: Payer: Self-pay

## 2023-09-25 ENCOUNTER — Other Ambulatory Visit: Payer: Self-pay

## 2023-09-25 MED FILL — Montelukast Sodium Tab 10 MG (Base Equiv): ORAL | 90 days supply | Qty: 90 | Fill #0 | Status: AC

## 2023-09-25 NOTE — Telephone Encounter (Signed)
 Requested Prescriptions  Pending Prescriptions Disp Refills   montelukast (SINGULAIR) 10 MG tablet 90 tablet 1    Sig: Take 1 tablet (10 mg total) by mouth once daily.     Pulmonology:  Leukotriene Inhibitors Passed - 09/25/2023 10:42 AM      Passed - Valid encounter within last 12 months    Recent Outpatient Visits           7 months ago Type 2 diabetes mellitus with hyperglycemia, without long-term current use of insulin [facility])   [place] [facility] [place], [place] W, NP   1 year ago Type 2 diabetes mellitus with hyperglycemia, without long-term current use of insulin [facility])   [place] [facility] [place], [place], NP   1 year ago Type 2 diabetes mellitus with hyperglycemia, without long-term current use of insulin [facility])   [place] [facility] [place], [place], NP   2 years ago Type 2 diabetes mellitus with hyperglycemia, without long-term current use of insulin [facility])   [place] [place], [place] W, NP   2 years ago Type 2 diabetes mellitus with hyperglycemia, without long-term current use of insulin [facility])   [place] [facility] [place], Salvadore Oxford, NP       Future Appointments             In 5 months Baity, Salvadore Oxford, NP  [facility]

## 2023-09-27 ENCOUNTER — Other Ambulatory Visit: Payer: Self-pay

## 2023-10-02 ENCOUNTER — Other Ambulatory Visit: Payer: Self-pay | Admitting: Internal Medicine

## 2023-10-02 DIAGNOSIS — I10 Essential (primary) hypertension: Secondary | ICD-10-CM

## 2023-10-03 ENCOUNTER — Other Ambulatory Visit: Payer: Self-pay

## 2023-10-03 ENCOUNTER — Other Ambulatory Visit: Payer: Self-pay | Admitting: Internal Medicine

## 2023-10-03 DIAGNOSIS — I10 Essential (primary) hypertension: Secondary | ICD-10-CM

## 2023-10-04 ENCOUNTER — Other Ambulatory Visit: Payer: Self-pay

## 2023-10-04 MED FILL — Potassium Chloride Microencapsulated Crys ER Tab 20 mEq: ORAL | 90 days supply | Qty: 180 | Fill #0 | Status: AC

## 2023-10-04 NOTE — Telephone Encounter (Signed)
 Requested Prescriptions  Pending Prescriptions Disp Refills   potassium chloride SA (KLOR-CON M) 20 MEQ tablet 180 tablet 0    Sig: Take 1 tablet (20 mEq total) by mouth 2 (two) times daily.     Endocrinology:  Minerals - Potassium Supplementation Failed - 10/04/2023 12:37 PM      Failed - K in normal range and within 360 days    Potassium  Date Value Ref Range Status  09/02/2023 3.2 (L) 3.5 - 5.1 mmol/L Final  09/22/2012 3.9 3.5 - 5.1 mmol/L Final         Failed - Cr in normal range and within 360 days    Creat  Date Value Ref Range Status  03/13/2018 0.91 0.60 - 1.35 mg/dL Final   Creatinine, Ser  Date Value Ref Range Status  09/02/2023 1.26 (H) 0.61 - 1.24 mg/dL Final         Passed - Valid encounter within last 12 months    Recent Outpatient Visits           7 months ago Type 2 diabetes mellitus with hyperglycemia, without long-term current use of insulin [facility])   [place] [facility] [place], [place], NP   1 year ago Type 2 diabetes mellitus with hyperglycemia, without long-term current use of insulin [facility])   [place] [facility] [place], [place], NP   1 year ago Type 2 diabetes mellitus with hyperglycemia, without long-term current use of insulin [facility])   [place] [facility] [place], [place] W, NP   2 years ago Type 2 diabetes mellitus with hyperglycemia, without long-term current use of insulin [facility])   [place] [facility] [place], [place], NP   2 years ago Type 2 diabetes mellitus with hyperglycemia, without long-term current use of insulin [facility])   [place] [facility] [place], Salvadore Oxford, NP       Future Appointments             In 5 months Baity, Salvadore Oxford, NP [facility], [facility]

## 2023-10-20 ENCOUNTER — Other Ambulatory Visit: Payer: Self-pay | Admitting: Internal Medicine

## 2023-10-21 ENCOUNTER — Other Ambulatory Visit: Payer: Self-pay

## 2023-10-21 ENCOUNTER — Other Ambulatory Visit: Payer: Self-pay | Admitting: Internal Medicine

## 2023-10-22 ENCOUNTER — Other Ambulatory Visit: Payer: Self-pay

## 2023-10-22 MED FILL — Fluticasone Propionate Nasal Susp 50 MCG/ACT: NASAL | 90 days supply | Qty: 48 | Fill #0 | Status: AC

## 2023-10-22 NOTE — Telephone Encounter (Signed)
 Requested Prescriptions  Pending Prescriptions Disp Refills   fluticasone (FLONASE) 50 MCG/ACT nasal spray 48 g 0    Sig: Place 2 sprays into both nostrils daily.     Ear, Nose, and Throat: Nasal Preparations - Corticosteroids Passed - 10/22/2023  1:28 PM      Passed - Valid encounter within last 12 months    Recent Outpatient Visits           1 month ago Type 2 diabetes mellitus with hyperglycemia, without long-term current use of insulin [facility])   [place] [facility] [place], Salvadore Oxford, NP       Future Appointments             In 4 months Baity, Salvadore Oxford, NP [place] [facility], [facility]

## 2023-10-23 ENCOUNTER — Other Ambulatory Visit: Payer: Self-pay

## 2023-10-27 ENCOUNTER — Other Ambulatory Visit: Payer: Self-pay

## 2023-10-27 MED FILL — Amlodipine Besylate Tab 10 MG (Base Equivalent): ORAL | 90 days supply | Qty: 90 | Fill #0 | Status: AC

## 2023-11-17 ENCOUNTER — Other Ambulatory Visit: Payer: Self-pay

## 2023-11-17 MED FILL — Montelukast Sodium Tab 10 MG (Base Equiv): ORAL | 90 days supply | Qty: 90 | Fill #1 | Status: CN

## 2023-11-24 ENCOUNTER — Other Ambulatory Visit: Payer: Self-pay | Admitting: Internal Medicine

## 2023-11-24 DIAGNOSIS — E039 Hypothyroidism, unspecified: Secondary | ICD-10-CM

## 2023-11-24 DIAGNOSIS — I10 Essential (primary) hypertension: Secondary | ICD-10-CM

## 2023-11-25 ENCOUNTER — Other Ambulatory Visit: Payer: Self-pay

## 2023-11-25 ENCOUNTER — Other Ambulatory Visit: Payer: Self-pay | Admitting: Internal Medicine

## 2023-11-25 DIAGNOSIS — I10 Essential (primary) hypertension: Secondary | ICD-10-CM

## 2023-11-25 DIAGNOSIS — E039 Hypothyroidism, unspecified: Secondary | ICD-10-CM

## 2023-11-26 ENCOUNTER — Other Ambulatory Visit: Payer: Self-pay

## 2023-11-27 ENCOUNTER — Other Ambulatory Visit: Payer: Self-pay

## 2023-11-27 MED FILL — Carvedilol Tab 25 MG: ORAL | 90 days supply | Qty: 180 | Fill #0 | Status: AC

## 2023-11-27 MED FILL — Rosuvastatin Calcium Tab 20 MG: ORAL | 90 days supply | Qty: 90 | Fill #0 | Status: AC

## 2023-11-27 MED FILL — Levothyroxine Sodium Tab 100 MCG: ORAL | 90 days supply | Qty: 90 | Fill #0 | Status: AC

## 2023-11-27 NOTE — Telephone Encounter (Signed)
 Requested Prescriptions  Pending Prescriptions Disp Refills   carvedilol  (COREG ) 25 MG tablet 180 tablet 1    Sig: Take 1 tablet (25 mg total) by mouth 2 (two) times daily with a meal.     Cardiovascular: Beta Blockers 3 Failed - 11/27/2023 10:52 AM      Failed - Cr in normal range and within 360 days    Creat  Date Value Ref Range Status  03/13/2018 0.91 0.60 - 1.35 mg/dL Final   Creatinine, Ser  Date Value Ref Range Status  09/02/2023 1.26 (H) 0.61 - 1.24 mg/dL Final         Passed - AST in normal range and within 360 days    AST  Date Value Ref Range Status  09/02/2023 29 15 - 41 U/L Final   SGOT(AST)  Date Value Ref Range Status  09/22/2012 28 15 - 37 Unit/L Final         Passed - ALT in normal range and within 360 days    ALT  Date Value Ref Range Status  09/02/2023 26 0 - 44 U/L Final   SGPT (ALT)  Date Value Ref Range Status  09/22/2012 31 12 - 78 U/L Final         Passed - Last BP in normal range    BP Readings from Last 1 Encounters:  09/02/23 138/78         Passed - Last Heart Rate in normal range    Pulse Readings from Last 1 Encounters:  02/25/23 75         Passed - Valid encounter within last 6 months    Recent Outpatient Visits           2 months ago Type 2 diabetes mellitus with hyperglycemia, without long-term current use of insulin  [facility])   [place] [facility] [place], Rankin Buzzard, NP       Future Appointments             In 3 months Baity, Rankin Buzzard, NP [place] [facility], [facility]             levothyroxine  (SYNTHROID ) 100 MCG tablet 90 tablet 1    Sig: TAKE ONE TABLET BY MOUTH EVERY DAY     Endocrinology:  Hypothyroid Agents Failed - 11/27/2023 10:52 AM      Failed - TSH in normal range and within 360 days    TSH  Date Value Ref Range Status  09/02/2023 5.583 (H) 0.350 - 4.500 uIU/mL Final    Comment:    Performed by a 3rd Generation assay with a functional sensitivity of <=0.01  uIU/mL. Performed at [facility], [address] [place], [place] [address]   03/13/2018 5.25 (H) 0.40 - 4.50 mIU/L Final         Passed - Valid encounter within last 12 months    Recent Outpatient Visits           2 months ago Type 2 diabetes mellitus with hyperglycemia, without long-term current use of insulin  [facility])   [place] [facility] [place] [facility] [place], Rankin Buzzard, NP       Future Appointments             In 3 months Baity, Rankin Buzzard, NP [place] [facility], [facility]             rosuvastatin  (CRESTOR ) 20 MG tablet 90 tablet 1    Sig: Take 1 tablet (20  mg total) by mouth daily.     Cardiovascular:  Antilipid - Statins 2 Failed - 11/27/2023 10:52 AM      Failed - Cr in normal range and within 360 days    Creat  Date Value Ref Range Status  03/13/2018 0.91 0.60 - 1.35 mg/dL Final   Creatinine, Ser  Date Value Ref Range Status  09/02/2023 1.26 (H) 0.61 - 1.24 mg/dL Final         Failed - Lipid Panel in normal range within the last 12 months    Cholesterol  Date Value Ref Range Status  09/02/2023 89 0 - 200 mg/dL Final   LDL Cholesterol (Calc)  Date Value Ref Range Status  03/13/2018 65 mg/dL (calc) Final    Comment:    Reference range: <100 . Desirable range <100 mg/dL for primary prevention;   <70 mg/dL for patients with CHD or diabetic patients  with > or = 2 CHD risk factors. Aaron Aas LDL-C is now calculated using the Martin-Hopkins  calculation, which is a validated novel method providing  better accuracy than the Friedewald equation in the  estimation of LDL-C.  Melinda Sprawls et al. Erroll Heard. 1610;960(45): 2061-2068  (http://education.[employer].com/faq/FAQ164)    LDL Cholesterol  Date Value Ref Range Status  09/02/2023 28 0 - 99 mg/dL Final    Comment:           Total Cholesterol/HDL:CHD Risk Coronary Heart Disease Risk Table                     Men   Women  1/2 Average Risk   3.4   3.3  Average Risk       5.0    4.4  2 X Average Risk   9.6   7.1  3 X Average Risk  23.4   11.0        Use the calculated Patient Ratio above and the CHD Risk Table to determine the patient's CHD Risk.        ATP III CLASSIFICATION (LDL):  <100     mg/dL   Optimal  409-811  mg/dL   Near or Above                    Optimal  130-159  mg/dL   Borderline  914-782  mg/dL   High  >956     mg/dL   Very High Performed at [facility], [address] [place], [place] [address]    HDL  Date Value Ref Range Status  09/02/2023 28 (L) >40 mg/dL Final   Triglycerides  Date Value Ref Range Status  09/02/2023 163 (H) <150 mg/dL Final         Passed - Patient is not pregnant      Passed - Valid encounter within last 12 months    Recent Outpatient Visits           2 months ago Type 2 diabetes mellitus with hyperglycemia, without long-term current use of insulin  [facility])   [place] [facility] [place], Rankin Buzzard, NP       Future Appointments             In 3 months Baity, Rankin Buzzard, NP [place] [facility], [facility]

## 2023-12-10 ENCOUNTER — Other Ambulatory Visit: Payer: Self-pay | Admitting: Internal Medicine

## 2023-12-10 MED FILL — Rosuvastatin Calcium Tab 20 MG: ORAL | 90 days supply | Qty: 90 | Fill #1 | Status: CN

## 2023-12-10 MED FILL — Montelukast Sodium Tab 10 MG (Base Equiv): ORAL | 90 days supply | Qty: 90 | Fill #1 | Status: CN

## 2023-12-10 MED FILL — Chlorthalidone Tab 25 MG: ORAL | 90 days supply | Qty: 45 | Fill #1 | Status: AC

## 2023-12-11 ENCOUNTER — Other Ambulatory Visit: Payer: Self-pay

## 2023-12-12 ENCOUNTER — Other Ambulatory Visit: Payer: Self-pay

## 2023-12-13 ENCOUNTER — Other Ambulatory Visit: Payer: Self-pay

## 2023-12-13 MED ORDER — FLUTICASONE PROPIONATE 50 MCG/ACT NA SUSP
2.0000 | Freq: Every day | NASAL | 0 refills | Status: AC
  Filled 2023-12-13 – 2023-12-24 (×2): qty 48, 90d supply, fill #0
  Filled 2024-01-12: qty 16, 30d supply, fill #0

## 2023-12-13 NOTE — Telephone Encounter (Signed)
 Requested Prescriptions  Pending Prescriptions Disp Refills   fluticasone  (FLONASE ) 50 MCG/ACT nasal spray 48 g 0    Sig: Place 2 sprays into both nostrils daily.     Ear, Nose, and Throat: Nasal Preparations - Corticosteroids Passed - 12/13/2023 10:36 AM      Passed - Valid encounter within last 12 months    Recent Outpatient Visits           3 months ago Type 2 diabetes mellitus with hyperglycemia, without long-term current use of insulin  [facility])   [place] [facility] [place], Rankin Buzzard, NP       Future Appointments             In 2 months Baity, Rankin Buzzard, NP [place] [facility], [facility]

## 2023-12-16 ENCOUNTER — Other Ambulatory Visit: Payer: Self-pay

## 2023-12-16 MED FILL — Olmesartan Medoxomil Tab 40 MG: ORAL | 90 days supply | Qty: 90 | Fill #1 | Status: AC

## 2023-12-16 MED FILL — Metformin HCl Tab 1000 MG: ORAL | 90 days supply | Qty: 180 | Fill #1 | Status: AC

## 2023-12-25 ENCOUNTER — Other Ambulatory Visit: Payer: Self-pay

## 2023-12-27 ENCOUNTER — Other Ambulatory Visit: Payer: Self-pay

## 2023-12-27 ENCOUNTER — Telehealth: Payer: Self-pay | Admitting: Physician Assistant

## 2023-12-27 DIAGNOSIS — H6502 Acute serous otitis media, left ear: Secondary | ICD-10-CM

## 2023-12-27 MED ORDER — AMOXICILLIN-POT CLAVULANATE 875-125 MG PO TABS
1.0000 | ORAL_TABLET | Freq: Two times a day (BID) | ORAL | 0 refills | Status: DC
  Filled 2023-12-27: qty 20, 10d supply, fill #0

## 2023-12-27 NOTE — Progress Notes (Signed)

## 2023-12-31 ENCOUNTER — Other Ambulatory Visit: Payer: Self-pay | Admitting: Internal Medicine

## 2023-12-31 DIAGNOSIS — I10 Essential (primary) hypertension: Secondary | ICD-10-CM

## 2023-12-31 MED FILL — Montelukast Sodium Tab 10 MG (Base Equiv): ORAL | 90 days supply | Qty: 90 | Fill #1 | Status: CN

## 2024-01-01 ENCOUNTER — Other Ambulatory Visit: Payer: Self-pay

## 2024-01-02 ENCOUNTER — Other Ambulatory Visit: Payer: Self-pay

## 2024-01-02 MED ORDER — POTASSIUM CHLORIDE CRYS ER 20 MEQ PO TBCR
20.0000 meq | EXTENDED_RELEASE_TABLET | Freq: Two times a day (BID) | ORAL | 1 refills | Status: DC
  Filled 2024-01-02 – 2024-02-16 (×3): qty 180, 90d supply, fill #0

## 2024-01-02 MED FILL — Montelukast Sodium Tab 10 MG (Base Equiv): ORAL | 90 days supply | Qty: 90 | Fill #1 | Status: CN

## 2024-01-02 NOTE — Telephone Encounter (Signed)
 Requested Prescriptions  Pending Prescriptions Disp Refills   potassium chloride  SA (KLOR-CON  M) 20 MEQ tablet 180 tablet 1    Sig: Take 1 tablet (20 mEq total) by mouth 2 (two) times daily.     Endocrinology:  Minerals - Potassium Supplementation Failed - 01/02/2024  4:36 PM      Failed - K in normal range and within 360 days    Potassium  Date Value Ref Range Status  09/02/2023 3.2 (L) 3.5 - 5.1 mmol/L Final  09/22/2012 3.9 3.5 - 5.1 mmol/L Final         Failed - Cr in normal range and within 360 days    Creat  Date Value Ref Range Status  03/13/2018 0.91 0.60 - 1.35 mg/dL Final   Creatinine, Ser  Date Value Ref Range Status  09/02/2023 1.26 (H) 0.61 - 1.24 mg/dL Final         Passed - Valid encounter within last 12 months    Recent Outpatient Visits           4 months ago Type 2 diabetes mellitus with hyperglycemia, without long-term current use of insulin  [facility])   [place] [facility] [place], Rankin Buzzard, NP       Future Appointments             In 2 months Baity, Rankin Buzzard, NP [place] [facility], [facility]

## 2024-01-06 ENCOUNTER — Other Ambulatory Visit: Payer: Self-pay

## 2024-01-12 ENCOUNTER — Other Ambulatory Visit: Payer: Self-pay

## 2024-01-13 ENCOUNTER — Other Ambulatory Visit: Payer: Self-pay

## 2024-01-14 ENCOUNTER — Other Ambulatory Visit: Payer: Self-pay

## 2024-01-16 ENCOUNTER — Other Ambulatory Visit: Payer: Self-pay

## 2024-01-16 MED FILL — Montelukast Sodium Tab 10 MG (Base Equiv): ORAL | 90 days supply | Qty: 90 | Fill #1 | Status: AC

## 2024-01-26 ENCOUNTER — Other Ambulatory Visit: Payer: Self-pay | Admitting: Internal Medicine

## 2024-01-26 ENCOUNTER — Other Ambulatory Visit: Payer: Self-pay

## 2024-01-26 DIAGNOSIS — I10 Essential (primary) hypertension: Secondary | ICD-10-CM

## 2024-01-27 ENCOUNTER — Other Ambulatory Visit: Payer: Self-pay

## 2024-01-27 ENCOUNTER — Other Ambulatory Visit: Payer: Self-pay | Admitting: Internal Medicine

## 2024-01-27 DIAGNOSIS — I10 Essential (primary) hypertension: Secondary | ICD-10-CM

## 2024-01-28 ENCOUNTER — Other Ambulatory Visit: Payer: Self-pay

## 2024-01-28 MED FILL — Amlodipine Besylate Tab 10 MG (Base Equivalent): ORAL | 90 days supply | Qty: 90 | Fill #0 | Status: CN

## 2024-01-28 NOTE — Telephone Encounter (Signed)
 Requested Prescriptions  Pending Prescriptions Disp Refills   amLODipine  (NORVASC ) 10 MG tablet 90 tablet 0    Sig: Take 1 tablet (10 mg total) by mouth daily.     Cardiovascular: Calcium  Channel Blockers 2 Passed - 01/28/2024  3:28 PM      Passed - Last BP in normal range    BP Readings from Last 1 Encounters:  09/02/23 138/78         Passed - Last Heart Rate in normal range    Pulse Readings from Last 1 Encounters:  02/25/23 75         Passed - Valid encounter within last 6 months    Recent Outpatient Visits           4 months ago Type 2 diabetes mellitus with hyperglycemia, without long-term current use of insulin  [facility])   [place] [facility] [place], Angeline ORN, NP       Future Appointments             In 1 month [place], Angeline ORN, NP [place] [facility], [facility]

## 2024-01-30 ENCOUNTER — Other Ambulatory Visit: Payer: Self-pay

## 2024-02-04 ENCOUNTER — Other Ambulatory Visit: Payer: Self-pay

## 2024-02-06 ENCOUNTER — Encounter: Payer: Self-pay | Admitting: Internal Medicine

## 2024-02-07 ENCOUNTER — Other Ambulatory Visit: Payer: Self-pay

## 2024-02-16 ENCOUNTER — Other Ambulatory Visit: Payer: Self-pay | Admitting: Internal Medicine

## 2024-02-16 ENCOUNTER — Other Ambulatory Visit: Payer: Self-pay

## 2024-02-16 MED FILL — Amlodipine Besylate Tab 10 MG (Base Equivalent): ORAL | 90 days supply | Qty: 90 | Fill #0 | Status: AC

## 2024-02-17 ENCOUNTER — Other Ambulatory Visit: Payer: Self-pay

## 2024-02-17 ENCOUNTER — Other Ambulatory Visit: Payer: Self-pay | Admitting: Internal Medicine

## 2024-02-18 ENCOUNTER — Other Ambulatory Visit: Payer: Self-pay

## 2024-02-21 ENCOUNTER — Other Ambulatory Visit: Payer: Self-pay

## 2024-02-26 ENCOUNTER — Other Ambulatory Visit: Payer: Self-pay

## 2024-02-26 MED FILL — Rosuvastatin Calcium Tab 20 MG: ORAL | 90 days supply | Qty: 90 | Fill #1 | Status: CN

## 2024-02-26 MED FILL — Carvedilol Tab 25 MG: ORAL | 30 days supply | Qty: 60 | Fill #1 | Status: AC

## 2024-02-26 MED FILL — Carvedilol Tab 25 MG: ORAL | 90 days supply | Qty: 180 | Fill #1 | Status: CN

## 2024-02-26 MED FILL — Levothyroxine Sodium Tab 100 MCG: ORAL | 90 days supply | Qty: 90 | Fill #1 | Status: AC

## 2024-02-26 MED FILL — Levothyroxine Sodium Tab 100 MCG: ORAL | 90 days supply | Qty: 90 | Fill #1 | Status: CN

## 2024-02-26 MED FILL — Rosuvastatin Calcium Tab 20 MG: ORAL | 15 days supply | Qty: 15 | Fill #1 | Status: CN

## 2024-02-26 MED FILL — Rosuvastatin Calcium Tab 20 MG: ORAL | 90 days supply | Qty: 90 | Fill #1 | Status: AC

## 2024-02-27 ENCOUNTER — Other Ambulatory Visit: Payer: Self-pay

## 2024-02-27 MED FILL — Rosuvastatin Calcium Tab 20 MG: ORAL | 30 days supply | Qty: 30 | Fill #1 | Status: AC

## 2024-03-02 ENCOUNTER — Ambulatory Visit: Payer: Medicaid Other | Admitting: Internal Medicine

## 2024-03-06 ENCOUNTER — Other Ambulatory Visit
Admission: RE | Admit: 2024-03-06 | Discharge: 2024-03-06 | Disposition: A | Discharge status: Home / Self Care | Payer: Self-pay | Source: Ambulatory Visit | Attending: Internal Medicine | Admitting: Internal Medicine

## 2024-03-06 ENCOUNTER — Ambulatory Visit (INDEPENDENT_AMBULATORY_CARE_PROVIDER_SITE_OTHER): Payer: Self-pay | Admitting: Internal Medicine

## 2024-03-06 ENCOUNTER — Encounter: Payer: Self-pay | Admitting: Internal Medicine

## 2024-03-06 VITALS — BP 132/80 | Ht 67.0 in | Wt 224.2 lb

## 2024-03-06 DIAGNOSIS — E039 Hypothyroidism, unspecified: Secondary | ICD-10-CM

## 2024-03-06 DIAGNOSIS — Z0001 Encounter for general adult medical examination with abnormal findings: Secondary | ICD-10-CM

## 2024-03-06 DIAGNOSIS — E1129 Type 2 diabetes mellitus with other diabetic kidney complication: Secondary | ICD-10-CM

## 2024-03-06 DIAGNOSIS — E785 Hyperlipidemia, unspecified: Secondary | ICD-10-CM

## 2024-03-06 DIAGNOSIS — D509 Iron deficiency anemia, unspecified: Secondary | ICD-10-CM

## 2024-03-06 DIAGNOSIS — I10 Essential (primary) hypertension: Secondary | ICD-10-CM

## 2024-03-06 DIAGNOSIS — E1169 Type 2 diabetes mellitus with other specified complication: Secondary | ICD-10-CM

## 2024-03-06 DIAGNOSIS — G4733 Obstructive sleep apnea (adult) (pediatric): Secondary | ICD-10-CM

## 2024-03-06 DIAGNOSIS — Z7984 Long term (current) use of oral hypoglycemic drugs: Secondary | ICD-10-CM

## 2024-03-06 DIAGNOSIS — E66812 Obesity, class 2: Secondary | ICD-10-CM

## 2024-03-06 DIAGNOSIS — Z85528 Personal history of other malignant neoplasm of kidney: Secondary | ICD-10-CM

## 2024-03-06 DIAGNOSIS — R350 Frequency of micturition: Secondary | ICD-10-CM

## 2024-03-06 DIAGNOSIS — R809 Proteinuria, unspecified: Secondary | ICD-10-CM

## 2024-03-06 DIAGNOSIS — I700 Atherosclerosis of aorta: Secondary | ICD-10-CM

## 2024-03-06 DIAGNOSIS — E1165 Type 2 diabetes mellitus with hyperglycemia: Secondary | ICD-10-CM

## 2024-03-06 DIAGNOSIS — I2510 Atherosclerotic heart disease of native coronary artery without angina pectoris: Secondary | ICD-10-CM

## 2024-03-06 DIAGNOSIS — R310 Gross hematuria: Secondary | ICD-10-CM

## 2024-03-06 LAB — COMPREHENSIVE METABOLIC PANEL WITH GFR
ALT: 15 U/L (ref 0–44)
AST: 24 U/L (ref 15–41)
Albumin: 3.6 g/dL (ref 3.5–5.0)
Alkaline Phosphatase: 61 U/L (ref 38–126)
Anion gap: 13 (ref 5–15)
BUN: 19 mg/dL (ref 6–20)
CO2: 24 mmol/L (ref 22–32)
Calcium: 9.3 mg/dL (ref 8.9–10.3)
Chloride: 100 mmol/L (ref 98–111)
Creatinine, Ser: 1.51 mg/dL — ABNORMAL HIGH (ref 0.61–1.24)
GFR, Estimated: 55 mL/min — ABNORMAL LOW (ref >60)
Glucose, Bld: 130 mg/dL — ABNORMAL HIGH (ref 70–99)
Potassium: 3.4 mmol/L — ABNORMAL LOW (ref 3.5–5.1)
Sodium: 137 mmol/L (ref 135–145)
Total Bilirubin: 0.4 mg/dL (ref 0.0–1.2)
Total Protein: 7.6 g/dL (ref 6.5–8.1)

## 2024-03-06 LAB — POCT URINE DIPSTICK
Bilirubin, UA: NEGATIVE
Glucose, UA: NEGATIVE mg/dL
Ketones, POC UA: NEGATIVE mg/dL
Nitrite, UA: NEGATIVE
POC PROTEIN,UA: 30 — AB
Spec Grav, UA: 1.01 (ref 1.010–1.025)
Urobilinogen, UA: 0.2 U/dL
pH, UA: 6.5 (ref 5.0–8.0)

## 2024-03-06 LAB — PSA: Prostatic Specific Antigen: 0.45 ng/mL (ref 0.00–4.00)

## 2024-03-06 LAB — TSH: TSH: 4.436 u[IU]/mL (ref 0.350–4.500)

## 2024-03-06 LAB — HEMOGLOBIN A1C
Hgb A1c MFr Bld: 6.3 % — ABNORMAL HIGH (ref 4.8–5.6)
Mean Plasma Glucose: 134.11 mg/dL

## 2024-03-06 LAB — CBC
HCT: 31.3 % — ABNORMAL LOW (ref 39.0–52.0)
Hemoglobin: 9.6 g/dL — ABNORMAL LOW (ref 13.0–17.0)
MCH: 23 pg — ABNORMAL LOW (ref 26.0–34.0)
MCHC: 30.7 g/dL (ref 30.0–36.0)
MCV: 75.1 fL — ABNORMAL LOW (ref 80.0–100.0)
Platelets: 273 K/uL (ref 150–400)
RBC: 4.17 MIL/uL — ABNORMAL LOW (ref 4.22–5.81)
RDW: 15.4 % (ref 11.5–15.5)
WBC: 7.6 K/uL (ref 4.0–10.5)
nRBC: 0 % (ref 0.0–0.2)

## 2024-03-06 LAB — T4, FREE: Free T4: 0.95 ng/dL (ref 0.61–1.12)

## 2024-03-06 LAB — LIPID PANEL
Cholesterol: 109 mg/dL (ref 0–200)
HDL: 28 mg/dL — ABNORMAL LOW (ref >40)
LDL Cholesterol: 43 mg/dL (ref 0–99)
Total CHOL/HDL Ratio: 3.9 ratio
Triglycerides: 191 mg/dL — ABNORMAL HIGH (ref <150)
VLDL: 38 mg/dL (ref 0–40)

## 2024-03-06 MED ORDER — CIPROFLOXACIN HCL 500 MG PO TABS: 500.0000 mg | ORAL_TABLET | Freq: Two times a day (BID) | ORAL | 0 refills | Status: DC

## 2024-03-06 NOTE — Progress Notes (Signed)
 Subjective:    Patient ID: Charles Mathis, male    DOB: 1971-06-27, 53 y.o.   MRN: [id number]  HPI  Patient presents in clinic today for follow-up of chronic conditions.  HTN: His BP today is 132/80.  He is taking amlodipine , carvedilol , and olmesartan  as prescribed but he is not taking chlorthalidone  or potassium as prescribed.  ECG from 01/2019 reviewed.  HLD with CAD, aortic atherosclerosis: His last LDL was 28, triglycerides 836, 08/2023.  He denies myalgias on rosuvastatin .  He is taking aspirin as well.  He does not consume a low-fat diet.  DM2: His last A1c was 6.4%, 08/2023.  He is taking metformin  as prescribed.  He is on olmesartan  for renal protection.  He does not check his sugars.  He does not check his feet routinely.  His last eye exam was 07/2022, [facility].  Flu never.  Pneumovax never.  COVID x 2.  Hypothyroidism: He denies any issues on his current dose of levothyroxine .  He does not follow with endocrinology.  OSA: He averages 8 hours of sleep per night without the use of CPAP.  There is no sleep study on file.  Anemia: His last H/H was 11.3/35.7, 08/2023.  He is not taking any oral iron at this time.  He does not follow with hematology.  He reports an episode recently of right lower abdominal pain and blood in his urine.  He reports the symptoms only lasted a few hours.  He does have urinary frequency and nocturia but these are more chronic issues.  He denies urinary urgency, hesitancy, penile discharge, testicular pain or swelling.  He has never had any history of kidney stones.  He did not take any medications for this. Patient Review of Systems     Past Medical History:  Diagnosis Date   Allergy    Diabetes mellitus without complication (HCC)    Hypertension    Renal cancer, left (HCC) 01/2019   Left Nephrectomy    Current Outpatient Medications  Medication Sig Dispense Refill   acetaminophen  (TYLENOL ) 325 MG tablet Take 650 mg by mouth every 6 (six)  hours as needed for headache.     amLODipine  (NORVASC ) 10 MG tablet Take 1 tablet (10 mg total) by mouth daily. 90 tablet 0   amoxicillin -clavulanate (AUGMENTIN ) 875-125 MG tablet Take 1 tablet by mouth 2 (two) times daily. 20 tablet 0   aspirin EC 81 MG tablet Take 81 mg by mouth daily.     blood glucose meter kit and supplies Dispense based on patient and insurance preference. Use to check blood sugar daily as directed. DX: E11.9. 1 each 0   carvedilol  (COREG ) 25 MG tablet Take 1 tablet (25 mg total) by mouth 2 (two) times daily with a meal. 180 tablet 1   chlorthalidone  (HYGROTON ) 25 MG tablet Take 0.5 tablets (12.5 mg total) by mouth daily. 45 tablet 1   fluticasone  (FLONASE ) 50 MCG/ACT nasal spray Place 2 sprays into both nostrils daily. 48 g 0   glucose blood (RIGHTEST GS550 BLOOD GLUCOSE) test strip Use to check blood sugar daily as directed 100 each 0   Lancets 33G MISC Use as directed to check blood sugar daily. 100 each 0   levothyroxine  (SYNTHROID ) 100 MCG tablet Take 1 tablet (100 mcg total) by mouth daily. 90 tablet 1   metFORMIN  (GLUCOPHAGE ) 1000 MG tablet Take 1 tablet (1,000 mg total) by mouth 2 (two) times daily with a meal. 180 tablet 1   montelukast  (  SINGULAIR ) 10 MG tablet Take 1 tablet (10 mg total) by mouth once daily. 90 tablet 1   olmesartan  (BENICAR ) 40 MG tablet Take 1 tablet (40 mg total) by mouth daily. 90 tablet 1   potassium chloride  SA (KLOR-CON  M) 20 MEQ tablet Take 1 tablet (20 mEq total) by mouth 2 (two) times daily. 180 tablet 1   rosuvastatin  (CRESTOR ) 20 MG tablet Take 1 tablet (20 mg total) by mouth daily. 90 tablet 1   No current facility-administered medications for this visit.    No Known Allergies  Family History  Problem Relation Age of Onset   Diabetes Mother    Heart disease Mother    Mental illness Mother    Thyroid  disease Mother    Heart disease Father    Diabetes Father    Mental illness Father     Social History   Socioeconomic  History   Marital status: Married    Spouse name: Not on file   Number of children: Not on file   Years of education: Not on file   Highest education level: GED or equivalent  Occupational History   Not on file  Tobacco Use   Smoking status: Never   Smokeless tobacco: Never  Vaping Use   Vaping status: Never Used  Substance and Sexual Activity   Alcohol use: No   Drug use: No   Sexual activity: Yes    Birth control/protection: Other-see comments    Comment: mutually monogamous relationship partner postmenopausal  Other Topics Concern   Not on file  Social History Narrative   In [place]/ with wife; son [downs syndrome]; no smoking/ no alcohol; works for Engineer, water.    Social Drivers of Health   Financial Resource Strain: High Risk (03/05/2024)   Overall Financial Resource Strain (CARDIA)    Difficulty of Paying Living Expenses: Hard  Food Insecurity: Food Insecurity Present (03/05/2024)   Hunger Vital Sign    Worried About Running Out of Food in the Last Year: Sometimes true    Ran Out of Food in the Last Year: Sometimes true  Transportation Needs: No Transportation Needs (03/05/2024)   PRAPARE - Administrator, Civil Service (Medical): No    Lack of Transportation (Non-Medical): No  Physical Activity: Insufficiently Active (03/05/2024)   Exercise Vital Sign    Days of Exercise per Week: 5 days    Minutes of Exercise per Session: 20 min  Stress: No Stress Concern Present (03/05/2024)   [employer] of Occupational Health - Occupational Stress Questionnaire    Feeling of Stress: Only a [place]  Social Connections: Unknown (03/05/2024)   Social Connection and Isolation Panel    Frequency of Communication with Friends and Family: Once a week    Frequency of Social Gatherings with Friends and Family: Patient declined    Attends Religious Services: Never    Database administrator or Organizations: No    Attends Engineer, structural: Not on  file    Marital Status: Married  Catering manager Violence: Not on file     Constitutional: Denies fever, malaise, fatigue, headache or abrupt weight changes.  HEENT: Denies eye pain, eye redness, ear pain, ringing in the ears, wax buildup, runny nose, nasal congestion, bloody nose, or sore throat. Respiratory: Denies difficulty breathing, shortness of breath, cough or sputum production.   Cardiovascular: Patient reports intermittent swelling in legs.  Denies chest pain, chest tightness, palpitations or swelling in the hands.  Gastrointestinal: Denies abdominal pain, bloating,  constipation, diarrhea or blood in the stool.  GU: Pt reports urinary frequency, nocturia and blood in urine. Denies urgency, pain with urination, burning sensation, odor or discharge. Musculoskeletal: Denies decrease in range of motion, difficulty with gait, muscle pain or joint pain and swelling.  Skin: Denies redness, rashes, lesions or ulcercations.  Neurological: Denies dizziness, difficulty with memory, difficulty with speech or problems with balance and coordination.  Psych: Denies anxiety, depression, SI/HI.  No other specific complaints in a complete review of systems (except as listed in HPI above).  Objective:   Physical Exam BP 132/80 (BP Location: Left Arm, Patient Position: Sitting, Cuff Size: Normal)   Ht 5' 7 (1.702 m)   Wt 224 lb 3.2 oz (101.7 kg)   BMI 35.11 kg/m    Wt Readings from Last 3 Encounters:  09/02/23 227 lb 6.4 oz (103.1 kg)  02/25/23 238 lb (108 kg)  06/19/22 233 lb (105.7 kg)    General: Appears his stated age, obese, in NAD. Skin: Warm, dry and intact.   No ulcerations noted. HEENT: Head: normal shape and size; Eyes: sclera white, no icterus, conjunctiva pink, PERRLA and EOMs intact;  Cardiovascular: Normal rate and rhythm. S1,S2 noted.  No murmur, rubs or gallops noted. No JVD or BLE edema. No carotid bruits noted. Pulmonary/Chest: Normal effort and positive vesicular  breath sounds. No respiratory distress. No wheezes, rales or ronchi noted.  Abdomen: Soft and nontender.  No CVA tenderness noted. Musculoskeletal: No difficulty with gait.  Neurological: Alert and oriented. Coordination normal.  Psychiatric: Mood and affect normal. Behavior is normal. Judgment and thought content normal.    BMET    Component Value Date/Time   NA 139 09/02/2023 0856   NA 143 01/24/2017 1306   NA 139 09/22/2012 1156   K 3.2 (L) 09/02/2023 0856   K 3.9 09/22/2012 1156   CL 101 09/02/2023 0856   CL 106 09/22/2012 1156   CO2 27 09/02/2023 0856   CO2 29 09/22/2012 1156   GLUCOSE 110 (H) 09/02/2023 0856   GLUCOSE 96 09/22/2012 1156   BUN 21 (H) 09/02/2023 0856   BUN 20 01/24/2017 1306   BUN 14 09/22/2012 1156   CREATININE 1.26 (H) 09/02/2023 0856   CREATININE 0.91 03/13/2018 0823   CALCIUM  9.7 09/02/2023 0856   CALCIUM  8.6 09/22/2012 1156   GFRNONAA >60 09/02/2023 0856   GFRNONAA 100 03/13/2018 0823   GFRAA >60 04/15/2020 0947   GFRAA 116 03/13/2018 0823    Lipid Panel     Component Value Date/Time   CHOL 89 09/02/2023 0856   TRIG 163 (H) 09/02/2023 0856   HDL 28 (L) 09/02/2023 0856   CHOLHDL 3.2 09/02/2023 0856   VLDL 33 09/02/2023 0856   LDLCALC 28 09/02/2023 0856   LDLCALC 65 03/13/2018 0823    CBC    Component Value Date/Time   WBC 9.6 09/02/2023 0856   RBC 4.78 09/02/2023 0856   HGB 11.3 (L) 09/02/2023 0856   HGB 12.5 (L) 09/22/2012 1156   HCT 35.7 (L) 09/02/2023 0856   HCT 38.1 (L) 09/22/2012 1156   PLT 283 09/02/2023 0856   PLT 275 09/22/2012 1156   MCV 74.7 (L) 09/02/2023 0856   MCV 81 09/22/2012 1156   MCH 23.6 (L) 09/02/2023 0856   MCHC 31.7 09/02/2023 0856   RDW 15.5 09/02/2023 0856   RDW 15.0 (H) 09/22/2012 1156   LYMPHSABS 1.8 12/16/2020 0831   MONOABS 0.8 12/16/2020 0831   EOSABS 0.6 (H) 12/16/2020 0831  BASOSABS 0.0 12/16/2020 0831    Hgb A1C Lab Results  Component Value Date   HGBA1C 6.4 (H) 09/02/2023           Assessment & Plan:  Urinary frequency, gross hematuria:  Urinalysis showed moderate blood and trace leuks Will send urine culture Rx for Cipro  500 mg twice daily x 7 days Encouraged him to push fluids.  RTC in 6 months for your annual exam Angeline Laura, NP

## 2024-03-06 NOTE — Assessment & Plan Note (Signed)
 No angina C-Met and lipid profile today Encouraged him to consume a low-fat diet Continue carvedilol  25 mg twice daily, rosuvastatin  20 mg and aspirin 81 mg daily

## 2024-03-06 NOTE — Assessment & Plan Note (Signed)
 C-Met and lipid profile today Encouraged him to consume a low-fat diet Continue rosuvastatin  20 mg and aspirin 81 mg daily

## 2024-03-06 NOTE — Assessment & Plan Note (Signed)
In remission CMET today

## 2024-03-06 NOTE — Assessment & Plan Note (Signed)
 C-Met and lipid profile today Encouraged him to consume a low-fat diet Continue rosuvastatin  20 mg daily

## 2024-03-06 NOTE — Assessment & Plan Note (Signed)
 Encourage diet and exercise for weight loss

## 2024-03-06 NOTE — Assessment & Plan Note (Signed)
 Urine microalbumin has been checked within the last year Olmesartan  40 mg daily for renal protection

## 2024-03-06 NOTE — Assessment & Plan Note (Signed)
 Controlled on amlodipine  10 mg daily, carvedilol  25 mg daily, and olmesartan  40 mg daily Will discontinue chlorthalidone  12.5 mg daily and potassium 20 mEq twice daily due to nonuse Reinforced DASH diet and exercise for weight loss C-Met today

## 2024-03-06 NOTE — Assessment & Plan Note (Signed)
Encourage weight loss as this can help reduce sleep apnea symptoms Noncompliant with CPAP

## 2024-03-06 NOTE — Assessment & Plan Note (Signed)
 TSH and free T4 today Continue levothyroxine  100 mcg, will adjust if needed based on labs

## 2024-03-06 NOTE — Assessment & Plan Note (Signed)
 A1c today  urine microalbumin has been checked within the last year Encouraged him to consume a low-carb diet and exercise for weight loss Continue metformin  1000 mg twice daily Encourage routine eye exam Encouraged routine foot exam He declines immunizations at this time

## 2024-03-06 NOTE — Assessment & Plan Note (Signed)
 CBC today He is not currently taking any oral iron

## 2024-03-06 NOTE — Addendum Note (Signed)
 Addended by: ANTONETTE ANGELINE ORN on: 03/06/2024 02:40 PM   Modules accepted: Orders

## 2024-03-07 LAB — URINE CULTURE
MICRO NUMBER:: 16871514
Result:: NO GROWTH
SPECIMEN QUALITY:: ADEQUATE

## 2024-03-09 ENCOUNTER — Other Ambulatory Visit: Payer: Self-pay | Admitting: Internal Medicine

## 2024-03-09 ENCOUNTER — Ambulatory Visit: Payer: Self-pay | Admitting: Internal Medicine

## 2024-03-09 ENCOUNTER — Other Ambulatory Visit: Payer: Self-pay

## 2024-03-09 DIAGNOSIS — D509 Iron deficiency anemia, unspecified: Secondary | ICD-10-CM

## 2024-03-09 DIAGNOSIS — Z85528 Personal history of other malignant neoplasm of kidney: Secondary | ICD-10-CM

## 2024-03-09 MED ORDER — ROSUVASTATIN CALCIUM 40 MG PO TABS
40.0000 mg | ORAL_TABLET | Freq: Every day | ORAL | 3 refills | Status: AC
  Filled 2024-03-09 – 2024-04-06 (×2): qty 90, 90d supply, fill #0
  Filled 2024-08-02: qty 90, 90d supply, fill #1

## 2024-03-09 NOTE — Addendum Note (Signed)
 Addended by: ANTONETTE ANGELINE ORN on: 03/09/2024 10:44 AM   Modules accepted: Orders

## 2024-03-10 ENCOUNTER — Other Ambulatory Visit: Payer: Self-pay

## 2024-03-13 DIAGNOSIS — N3001 Acute cystitis with hematuria: Secondary | ICD-10-CM | POA: Insufficient documentation

## 2024-03-13 DIAGNOSIS — I10 Essential (primary) hypertension: Secondary | ICD-10-CM | POA: Insufficient documentation

## 2024-03-13 DIAGNOSIS — E119 Type 2 diabetes mellitus without complications: Secondary | ICD-10-CM | POA: Insufficient documentation

## 2024-03-13 DIAGNOSIS — Z85528 Personal history of other malignant neoplasm of kidney: Secondary | ICD-10-CM | POA: Insufficient documentation

## 2024-03-13 DIAGNOSIS — C799 Secondary malignant neoplasm of unspecified site: Secondary | ICD-10-CM | POA: Insufficient documentation

## 2024-03-14 ENCOUNTER — Emergency Department
Admission: EM | Admit: 2024-03-14 | Discharge: 2024-03-14 | Disposition: A | Discharge status: Home / Self Care | Payer: Self-pay | Attending: Emergency Medicine | Admitting: Emergency Medicine

## 2024-03-14 ENCOUNTER — Other Ambulatory Visit: Payer: Self-pay

## 2024-03-14 ENCOUNTER — Emergency Department: Payer: Self-pay

## 2024-03-14 DIAGNOSIS — R109 Unspecified abdominal pain: Secondary | ICD-10-CM

## 2024-03-14 DIAGNOSIS — N3001 Acute cystitis with hematuria: Secondary | ICD-10-CM

## 2024-03-14 DIAGNOSIS — R319 Hematuria, unspecified: Secondary | ICD-10-CM

## 2024-03-14 DIAGNOSIS — C799 Secondary malignant neoplasm of unspecified site: Secondary | ICD-10-CM

## 2024-03-14 LAB — URINALYSIS, ROUTINE W REFLEX MICROSCOPIC
RBC / HPF: >50 RBC/hpf (ref 0–5)
Squamous Epithelial / HPF: 0 /HPF (ref 0–5)

## 2024-03-14 LAB — BASIC METABOLIC PANEL WITH GFR
Anion gap: 12 (ref 5–15)
BUN: 23 mg/dL — ABNORMAL HIGH (ref 6–20)
CO2: 23 mmol/L (ref 22–32)
Calcium: 9.5 mg/dL (ref 8.9–10.3)
Chloride: 102 mmol/L (ref 98–111)
Creatinine, Ser: 1.68 mg/dL — ABNORMAL HIGH (ref 0.61–1.24)
GFR, Estimated: 48 mL/min — ABNORMAL LOW (ref >60)
Glucose, Bld: 102 mg/dL — ABNORMAL HIGH (ref 70–99)
Potassium: 3.6 mmol/L (ref 3.5–5.1)
Sodium: 137 mmol/L (ref 135–145)

## 2024-03-14 LAB — CBC
HCT: 32.1 % — ABNORMAL LOW (ref 39.0–52.0)
Hemoglobin: 9.9 g/dL — ABNORMAL LOW (ref 13.0–17.0)
MCH: 23.5 pg — ABNORMAL LOW (ref 26.0–34.0)
MCHC: 30.8 g/dL (ref 30.0–36.0)
MCV: 76.1 fL — ABNORMAL LOW (ref 80.0–100.0)
Platelets: 300 K/uL (ref 150–400)
RBC: 4.22 MIL/uL (ref 4.22–5.81)
RDW: 15.3 % (ref 11.5–15.5)
WBC: 9.2 K/uL (ref 4.0–10.5)
nRBC: 0 % (ref 0.0–0.2)

## 2024-03-14 MED ORDER — CIPROFLOXACIN HCL 500 MG PO TABS
500.0000 mg | ORAL_TABLET | Freq: Two times a day (BID) | ORAL | 0 refills | Status: AC
  Filled 2024-03-14: qty 20, 10d supply, fill #0

## 2024-03-14 MED ORDER — CIPROFLOXACIN HCL 500 MG PO TABS
500.0000 mg | ORAL_TABLET | Freq: Once | ORAL | Status: AC
  Administered 2024-03-14: 500 mg via ORAL
  Filled 2024-03-14: qty 1

## 2024-03-14 NOTE — ED Triage Notes (Signed)
 Pt to ed from home via POV for abd pain and hematuria. Pt went to PCP last Friday and was told he was fine. Pt is caox4, in no acute distress and ambulatory in triage.

## 2024-03-14 NOTE — Discharge Instructions (Addendum)
 Please see the attached for contact information for the kidney doctors and cancer doctors who will schedule your follow-up for next week.  They will begin planning for further testing and treatment options for your cancer.  Take ciprofloxacin  antibiotic as prescribed for urine infection.  Take Tylenol  650 mg every 6 hours as needed for pain.  Thank you for choosing us  for your health care today!  Please see your primary doctor this week for a follow up appointment.   If you have any new, worsening, or unexpected symptoms call your doctor right away or come back to the emergency department for reevaluation.  It was my pleasure to care for you today.   Ginnie EDISON Cyrena, MD

## 2024-03-14 NOTE — ED Provider Notes (Signed)
 [facility] Provider Note    Event Date/Time   First MD Initiated Contact with Patient 03/14/24 0122     (approximate)   History   Hematuria   HPI  Charles Mathis is a 53 y.o. male   Past medical history of solitary kidney after renal cancer and nephrectomy years ago, diabetes, hypertension presents to the Emergency Department with hematuria and intermittent right sided flank pain for the last 1 week.  No trauma noted.  No history of kidney stones.  No dysuria.  The pain starts in his right flank and radiates into the right lower quadrant/groin area.  No testicular pain or swelling.  No fevers no chills.  He complained of similar symptoms at an appointment outpatient last week was started on ciprofloxacin .  He started taking it, discontinued, and then started taking it again recently.   External Medical Documents Reviewed: Outpatient note from 03/06/2024 with urinalysis with moderate blood and trace leukocytes, and a prescription for ciprofloxacin  given at that time      Physical Exam   Triage Vital Signs: ED Triage Vitals [03/14/24 0002]  Encounter Vitals Group     BP (!) 155/88     Girls Systolic BP Percentile      Girls Diastolic BP Percentile      Boys Systolic BP Percentile      Boys Diastolic BP Percentile      Pulse Rate 69     Resp 16     Temp 98 F (36.7 C)     Temp Source Oral     SpO2 99 %     Weight      Height 5' 7 (1.702 m)     Head Circumference      Peak Flow      Pain Score 5     Pain Loc      Pain Education      Exclude from Growth Chart     Most recent vital signs: Vitals:   03/14/24 0130 03/14/24 0400  BP: 129/74 (!) 140/76  Pulse:  66  Resp:  20  Temp:    SpO2:  99%    General: Awake, no distress.  CV:  Good peripheral perfusion.  Resp:  Normal effort.  Abd:  No distention.  Other:  Mild right-sided CVA tenderness and a soft benign abdominal exam.  Vital signs normal, pleasant gentleman no acute  distress afebrile.   ED Results / Procedures / Treatments   Labs (all labs ordered are listed, but only abnormal results are displayed) Labs Reviewed  URINALYSIS, ROUTINE W REFLEX MICROSCOPIC - Abnormal; Notable for the following components:      Result Value   Color, Urine RED (*)    APPearance TURBID (*)    Glucose, UA   (*)    Value: TEST NOT REPORTED DUE TO COLOR INTERFERENCE OF URINE PIGMENT   Hgb urine dipstick   (*)    Value: TEST NOT REPORTED DUE TO COLOR INTERFERENCE OF URINE PIGMENT   Bilirubin Urine   (*)    Value: TEST NOT REPORTED DUE TO COLOR INTERFERENCE OF URINE PIGMENT   Ketones, ur   (*)    Value: TEST NOT REPORTED DUE TO COLOR INTERFERENCE OF URINE PIGMENT   Protein, ur   (*)    Value: TEST NOT REPORTED DUE TO COLOR INTERFERENCE OF URINE PIGMENT   Nitrite   (*)    Value: TEST NOT REPORTED DUE TO COLOR INTERFERENCE OF URINE PIGMENT  Leukocytes,Ua   (*)    Value: TEST NOT REPORTED DUE TO COLOR INTERFERENCE OF URINE PIGMENT   Bacteria, UA MANY (*)    All other components within normal limits  BASIC METABOLIC PANEL WITH GFR - Abnormal; Notable for the following components:   Glucose, Bld 102 (*)    BUN 23 (*)    Creatinine, Ser 1.68 (*)    GFR, Estimated 48 (*)    All other components within normal limits  CBC - Abnormal; Notable for the following components:   Hemoglobin 9.9 (*)    HCT 32.1 (*)    MCV 76.1 (*)    MCH 23.5 (*)    All other components within normal limits  URINE CULTURE     I ordered and reviewed the above labs they are notable for kidney function slightly worsened, 1.68 creatinine compared to prior of 1.51 and low ones previously.  Anemia slightly improved from last testing about a week ago.    RADIOLOGY I independently reviewed and interpreted CT abdomen and see no obvious obstructive or inflammatory changes, enlarged right kidney. I also reviewed radiologist's formal read.   PROCEDURES:  Critical Care performed:  No  Procedures   MEDICATIONS ORDERED IN ED: Medications  ciprofloxacin  (CIPRO ) tablet 500 mg (500 mg Oral Given 03/14/24 0341)    External physician / consultants:  I spoke with Dr. Marcelino of nephrology and Dr. Rennie of oncology regarding care plan for this patient.   IMPRESSION / MDM / ASSESSMENT AND PLAN / ED COURSE  I reviewed the triage vital signs and the nursing notes.                                Patient's presentation is most consistent with acute presentation with potential threat to life or bodily function.  Differential diagnosis includes, but is not limited to, kidney stone, urine infection, malignancy, acute blood loss anemia, renal dysfunction   The patient is on the cardiac monitor to evaluate for evidence of arrhythmia and/or significant heart rate changes.  MDM:    I am concerned given his hematuria and flank pain that he may have a kidney stone or infection especially with his solitary kidney.  Renal function is slowly worsening over the past year.  Will check a urinalysis and CT renal stone protocol.  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  I informed the patient of these findings.  Kidney function slowly worsening as well.  Making urine, with normal postvoid, CT with deflated bladder no evidence of retention.  Does have findings consistent with urinary tract infection, will continue his ciprofloxacin  antibiotic and I sent for urine culture.  Spoke with both nephrologist and on-call oncologist to ensure very close follow-up and spoke with them directly about this case.  I considered hospitalization however do not see an emergent need for nephrectomy or initiation of cancer treatment, and my consultants as above are in agreement that outpatient follow-up is appropriate.  In terms of your infection he does not appear septic at this time.  Can be treated as outpatient, discharged with close follow-up as above.         FINAL CLINICAL IMPRESSION(S) / ED DIAGNOSES   Final diagnoses:  Right flank pain  Hematuria, unspecified type  Acute cystitis with hematuria  Metastatic malignant neoplasm, unspecified site [facility])     Rx / DC Orders   ED Discharge Orders  Ordered    ciprofloxacin  (CIPRO ) 500 MG tablet  2 times daily        03/14/24 0332    Ambulatory referral to Hematology / Oncology       Comments: Your emergency department provider has referred you to see a hematology/oncology specialist. These are physicians who specialize in blood disorders and cancers, or findings concerning for cancer. You will receive a phone call from the [facility] to set up your appointment within 2 business days: [employer] operate Mon - Fri, 8:00 a.m. to 5:00 p.m.; closed for federally recognized holidays. Please be sure your phone is not set to block numbers during this time.   03/14/24 0400             Note:  This document was prepared using Dragon voice recognition software and may include unintentional dictation errors.    Cyrena Mylar, MD 03/14/24 [phone]

## 2024-03-15 LAB — URINE CULTURE: Culture: NO GROWTH

## 2024-03-16 ENCOUNTER — Other Ambulatory Visit: Payer: Self-pay

## 2024-03-17 ENCOUNTER — Other Ambulatory Visit: Payer: Self-pay

## 2024-03-20 ENCOUNTER — Inpatient Hospital Stay: Payer: Self-pay | Attending: Internal Medicine | Admitting: Internal Medicine

## 2024-03-20 ENCOUNTER — Other Ambulatory Visit: Payer: Self-pay | Admitting: Internal Medicine

## 2024-03-20 ENCOUNTER — Other Ambulatory Visit: Payer: Self-pay | Admitting: *Deleted

## 2024-03-20 ENCOUNTER — Encounter: Payer: Self-pay | Admitting: Internal Medicine

## 2024-03-20 VITALS — BP 146/75 | HR 62 | Temp 96.6°F | Resp 16 | Ht 67.0 in | Wt 222.3 lb

## 2024-03-20 DIAGNOSIS — C642 Malignant neoplasm of left kidney, except renal pelvis: Secondary | ICD-10-CM

## 2024-03-20 DIAGNOSIS — Z905 Acquired absence of kidney: Secondary | ICD-10-CM | POA: Insufficient documentation

## 2024-03-20 DIAGNOSIS — C786 Secondary malignant neoplasm of retroperitoneum and peritoneum: Secondary | ICD-10-CM | POA: Diagnosis not present

## 2024-03-20 DIAGNOSIS — Z85528 Personal history of other malignant neoplasm of kidney: Secondary | ICD-10-CM | POA: Diagnosis not present

## 2024-03-20 DIAGNOSIS — E278 Other specified disorders of adrenal gland: Secondary | ICD-10-CM | POA: Insufficient documentation

## 2024-03-20 DIAGNOSIS — N2889 Other specified disorders of kidney and ureter: Secondary | ICD-10-CM | POA: Diagnosis present

## 2024-03-20 DIAGNOSIS — D508 Other iron deficiency anemias: Secondary | ICD-10-CM | POA: Insufficient documentation

## 2024-03-20 DIAGNOSIS — E039 Hypothyroidism, unspecified: Secondary | ICD-10-CM | POA: Diagnosis not present

## 2024-03-20 DIAGNOSIS — Z79890 Hormone replacement therapy (postmenopausal): Secondary | ICD-10-CM | POA: Diagnosis not present

## 2024-03-20 DIAGNOSIS — R911 Solitary pulmonary nodule: Secondary | ICD-10-CM | POA: Insufficient documentation

## 2024-03-20 DIAGNOSIS — I10 Essential (primary) hypertension: Secondary | ICD-10-CM | POA: Diagnosis not present

## 2024-03-20 DIAGNOSIS — R319 Hematuria, unspecified: Secondary | ICD-10-CM | POA: Insufficient documentation

## 2024-03-20 DIAGNOSIS — R590 Localized enlarged lymph nodes: Secondary | ICD-10-CM

## 2024-03-20 NOTE — Progress Notes (Signed)
No questions at this time

## 2024-03-20 NOTE — Assessment & Plan Note (Addendum)
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  50 mg, 2 weeks on,   # AUG 28th, 2025- ER- CT kidney protocol:  Interval development of multiple hyperdense masses within the nephrectomy bed and within the left periaortic region suspicious for recurrent disease; Interval development of an indeterminate 15 mm nodule within the right adrenal gland; Interval development of a 18 mm mean diameter nodule within the left lower lobe suspicious for development of pulmonary metastatic Disease. ALSO - RIGHT KIDNEY- least 4.6 x 5.2 cm, lower pole suspicious for a primary renal neoplasm or a metastasis.   # Give above concerns noted on imaging-recommend a CT of the chest noncontrast; and also recommend MRI of the abdomen with or without contrast for further evaluation.  Also discussed with the patient regarding getting a biopsy of the metastatic lesions.  Left a message for radiology.    # Iron deficient anemia secondary to hematuria- - #Recommend gentle iron [iron biglycinate; 28 mg ] 1 pill a day.  STOP ASPRIN.  Consider Venofer if not improved.  # # History of hypothyroidism-  on synthroid  100 mcg/d-   # HTN- well controlled-    # DISPOSITION: # CT Chest- ASAP MRI ASAP #  referral TO Dr. Francisca- re: recurrent kidney cancer # no labs-  # follow up in 2-3 weeks; MD- labs- cbc/cmp;LDH- iron studies; ferritin- venofer- Dr.B  # I reviewed the blood work- with the patient in detail; also reviewed the imaging independently [as summarized above]; and with the patient in detail.

## 2024-03-20 NOTE — Progress Notes (Signed)
 [facility] CONSULT NOTE  Patient Care Team: Antonette Angeline ORN, NP as PCP - General (Internal Medicine) Pa, [facility] Od Lapel, Cindy SAUNDERS, MD as Consulting Physician (Oncology)  CHIEF COMPLAINTS/PURPOSE OF CONSULTATION: kidney cancer  Oncology History Overview Note  #July 2020-left kidney cancer-clear cell carcinoma; grade 2.  PT3a/stage III status post radical nephrectomy; [Dr. Sniskinski]; July CT 2020- mid anterior left kidney measuring 8.6 x 8.1 x 9.4 cm; CT chest negative  #April 06, 2019-Sutent  50 mg 2 weeks on 1 week off.   # DM- 2- OHA/ HTN  DIAGNOSIS: Left kidney cancer  STAGE: 3       ;GOALS: Cure  CURRENT/MOST RECENT THERAPY: Sutent     History of kidney cancer  02/26/2019 Initial Diagnosis   Cancer of left kidney (HCC)     HISTORY OF PRESENTING ILLNESS: Patient ambulating- independently.   Alone/Accompanied by family.   Charles Mathis 53 y.o.  male pleasant patient with a above history of clear-cell carcinoma of the left kidney currently status post 1 year of adjuvant Sutent  completed in October 2021 is here for a follow up for abnormal CT scan done in ER.   Patient was lost to follow-up.   However patient presented to the Emergency Department with intermittent hematuria and intermittent right sided flank pain for the last 1 week.   The pain starts in his right flank and radiates into the right lower quadrant/groin area.  No testicular pain or swelling.  No fevers no chills.  Currently hematuria is improved.  Resolved.  His appetite is good.  No significant weight loss.  Denies any nausea or vomiting.  Any worsening pain.  No headaches.   Review of Systems  Constitutional:  Positive for malaise/fatigue. Negative for chills, diaphoresis, fever and weight loss.  HENT:  Negative for nosebleeds and sore throat.   Eyes:  Negative for double vision.  Respiratory:  Negative for cough, hemoptysis, sputum production, shortness of breath  and wheezing.   Cardiovascular:  Negative for chest pain, palpitations, orthopnea and leg swelling.  Gastrointestinal:  Negative for abdominal pain, blood in stool, constipation, diarrhea, heartburn, melena, nausea and vomiting.  Genitourinary:  Negative for dysuria, frequency and urgency.  Musculoskeletal:  Positive for back pain. Negative for joint pain.  Skin: Negative.  Negative for itching and rash.  Neurological:  Negative for dizziness, tingling, focal weakness, weakness and headaches.  Endo/Heme/Allergies:  Does not bruise/bleed easily.  Psychiatric/Behavioral:  Negative for depression. The patient is not nervous/anxious and does not have insomnia.     MEDICAL HISTORY:  Past Medical History:  Diagnosis Date   Allergy    Diabetes mellitus without complication (HCC)    Hypertension    Renal cancer, left (HCC) 01/2019   Left Nephrectomy    SURGICAL HISTORY: Past Surgical History:  Procedure Laterality Date   CYSTOSCOPY  02/06/2019   Procedure: CYSTOSCOPY;  Surgeon: Francisca Redell BROCKS, MD;  Location: [facility] ORS;  Service: Urology;;   LAPAROSCOPIC NEPHRECTOMY, HAND ASSISTED Left 02/06/2019   Procedure: HAND ASSISTED LAPAROSCOPIC LEFT RADICAL NEPHRECTOMY;  Surgeon: Francisca Redell BROCKS, MD;  Location: [facility] ORS;  Service: Urology;  Laterality: Left;   NO PAST SURGERIES      SOCIAL HISTORY: Social History   Socioeconomic History   Marital status: Married    Spouse name: Not on file   Number of children: Not on file   Years of education: Not on file   Highest education level: GED or equivalent  Occupational History  Not on file  Tobacco Use   Smoking status: Never   Smokeless tobacco: Never  Vaping Use   Vaping status: Never Used  Substance and Sexual Activity   Alcohol use: No   Drug use: No   Sexual activity: Yes    Birth control/protection: Other-see comments    Comment: mutually monogamous relationship partner postmenopausal  Other Topics Concern   Not on file  Social  History Narrative   In / with wife; son [downs syndrome]; no smoking/ no alcohol; works for Engineer, water.    Social Drivers of Corporate investment banker Strain: High Risk (03/05/2024)   Overall Financial Resource Strain (CARDIA)    Difficulty of Paying Living Expenses: Hard  Food Insecurity: No Food Insecurity (03/20/2024)   Hunger Vital Sign    Worried About Running Out of Food in the Last Year: Never true    Ran Out of Food in the Last Year: Never true  Recent Concern: Food Insecurity - Food Insecurity Present (03/05/2024)   Hunger Vital Sign    Worried About Running Out of Food in the Last Year: Sometimes true    Ran Out of Food in the Last Year: Sometimes true  Transportation Needs: No Transportation Needs (03/20/2024)   PRAPARE - Administrator, Civil Service (Medical): No    Lack of Transportation (Non-Medical): No  Physical Activity: Insufficiently Active (03/05/2024)   Exercise Vital Sign    Days of Exercise per Week: 5 days    Minutes of Exercise per Session: 20 min  Stress: No Stress Concern Present (03/05/2024)   [employer] of Occupational Health - Occupational Stress Questionnaire    Feeling of Stress: Only a [place]  Social Connections: Unknown (03/05/2024)   Social Connection and Isolation Panel    Frequency of Communication with Friends and Family: Once a week    Frequency of Social Gatherings with Friends and Family: Patient declined    Attends Religious Services: Never    Database administrator or Organizations: No    Attends Engineer, structural: Not on file    Marital Status: Married  Catering manager Violence: Not At Risk (03/20/2024)   Humiliation, Afraid, Rape, and Kick questionnaire    Fear of Current or Ex-Partner: No    Emotionally Abused: No    Physically Abused: No    Sexually Abused: No    FAMILY HISTORY: Family History  Problem Relation Age of Onset   Diabetes Mother    Heart disease Mother    Mental  illness Mother    Thyroid  disease Mother    Heart disease Father    Diabetes Father    Mental illness Father     ALLERGIES:  has no known allergies.  MEDICATIONS:  Current Outpatient Medications  Medication Sig Dispense Refill   acetaminophen  (TYLENOL ) 325 MG tablet Take 650 mg by mouth every 6 (six) hours as needed for headache.     amLODipine  (NORVASC ) 10 MG tablet Take 1 tablet (10 mg total) by mouth daily. 90 tablet 0   aspirin EC 81 MG tablet Take 81 mg by mouth daily.     blood glucose meter kit and supplies Dispense based on patient and insurance preference. Use to check blood sugar daily as directed. DX: E11.9. 1 each 0   carvedilol  (COREG ) 25 MG tablet Take 1 tablet (25 mg total) by mouth 2 (two) times daily with a meal. 180 tablet 1   ciprofloxacin  (CIPRO ) 500 MG tablet Take 1 tablet (  500 mg total) by mouth 2 (two) times daily for 10 days. 20 tablet 0   fluticasone  (FLONASE ) 50 MCG/ACT nasal spray Place 2 sprays into both nostrils daily. 48 g 0   levothyroxine  (SYNTHROID ) 100 MCG tablet Take 1 tablet (100 mcg total) by mouth daily. 90 tablet 1   metFORMIN  (GLUCOPHAGE ) 1000 MG tablet Take 1 tablet (1,000 mg total) by mouth 2 (two) times daily with a meal. 180 tablet 1   montelukast  (SINGULAIR ) 10 MG tablet Take 1 tablet (10 mg total) by mouth once daily. 90 tablet 1   olmesartan  (BENICAR ) 40 MG tablet Take 1 tablet (40 mg total) by mouth daily. 90 tablet 1   rosuvastatin  (CRESTOR ) 40 MG tablet Take 1 tablet (40 mg total) by mouth daily. 90 tablet 3   No current facility-administered medications for this visit.    PHYSICAL EXAMINATION:   Vitals:   03/20/24 0940  BP: (!) 146/75  Pulse: 62  Resp: 16  Temp: (!) 96.6 F (35.9 C)  SpO2: 100%   Filed Weights   03/20/24 0940  Weight: 222 lb 4.8 oz (100.8 kg)    Physical Exam Vitals and nursing note reviewed.  HENT:     Head: Normocephalic and atraumatic.     Mouth/Throat:     Pharynx: Oropharynx is clear.  Eyes:      Extraocular Movements: Extraocular movements intact.     Pupils: Pupils are equal, round, and reactive to light.  Cardiovascular:     Rate and Rhythm: Normal rate and regular rhythm.  Pulmonary:     Comments: Decreased breath sounds bilaterally.  Abdominal:     Palpations: Abdomen is soft.  Musculoskeletal:        General: Normal range of motion.     Cervical back: Normal range of motion.  Skin:    General: Skin is warm.  Neurological:     General: No focal deficit present.     Mental Status: He is alert and oriented to person, place, and time.  Psychiatric:        Behavior: Behavior normal.        Judgment: Judgment normal.     LABORATORY DATA:  I have reviewed the data as listed Lab Results  Component Value Date   WBC 9.2 03/14/2024   HGB 9.9 (L) 03/14/2024   HCT 32.1 (L) 03/14/2024   MCV 76.1 (L) 03/14/2024   PLT 300 03/14/2024   Recent Labs    09/02/23 0856 03/06/24 1556 03/14/24 0002  NA 139 137 137  K 3.2* 3.4* 3.6  CL 101 100 102  CO2 27 24 23   GLUCOSE 110* 130* 102*  BUN 21* 19 23*  CREATININE 1.26* 1.51* 1.68*  CALCIUM  9.7 9.3 9.5  GFRNONAA >60 55* 48*  PROT 8.2* 7.6  --   ALBUMIN  4.1 3.6  --   AST 29 24  --   ALT 26 15  --   ALKPHOS 54 61  --   BILITOT 0.7 0.4  --     RADIOGRAPHIC STUDIES: I have personally reviewed the radiological images as listed and agreed with the findings in the report. CT Renal Stone Study Result Date: 03/14/2024 CLINICAL DATA:  Hematuria, flank pain EXAM: CT ABDOMEN AND PELVIS WITHOUT CONTRAST TECHNIQUE: Multidetector CT imaging of the abdomen and pelvis was performed following the standard protocol without IV contrast. RADIATION DOSE REDUCTION: This exam was performed according to the departmental dose-optimization program which includes automated exposure control, adjustment of the mA and/or kV according  to patient size and/or use of iterative reconstruction technique. COMPARISON:  09/12/2020 FINDINGS: Lower chest: Mean  18 mm within the left lower lobe suspicious for development of pulmonary metastatic disease in this patient with a history of known renal cell carcinoma. No pleural effusion. Extensive right coronary artery calcification. Global cardiac size within normal limits. Hepatobiliary: Layering subtle hyperdensity within the gallbladder lumen may reflect layering sludge. No superimposed pericholecystic inflammatory change. Liver unremarkable on this noncontrast examination. No intra or extrahepatic biliary ductal dilation. Pancreas: Unremarkable Spleen: Unremarkable Adrenals/Urinary Tract: Interval development of an indeterminate 15 mm nodule within the right adrenal gland. Left adrenal gland is unremarkable. Status post left nephrectomy. Interval development of multiple hyperdense masses within the nephrectomy bed measuring up to 17 x 27 mm suspicious for recurrent disease. There is compensatory hypertrophy of the right kidney. There is development of a lobulated mildly hyperdense cortical mass within the lower pole the right kidney measuring at least 4.6 x 5.2 cm, not well characterized on this noncontrast examination. There is hyperdense material seen within the nondilated right renal pelvis likely representing blood product. Intrapelvic extension of the lobulated mass within the lower pole the right kidney is not excluded. No hydronephrosis. Mild right perinephric stranding, nonspecific. The bladder is decompressed and is unremarkable. Stomach/Bowel: Stomach is within normal limits. Appendix is not clearly identified, however, no secondary signs of appendicitis within the right lower quadrant. No evidence of bowel wall thickening, distention, or inflammatory changes. Vascular/Lymphatic: 23 mm rounded mass is seen within the left periaortic region (42/2) in keeping with pathologic adenopathy or a retroperitoneal metastasis given the history of a renal cell carcinoma. No additional pathologic adenopathy identified.  Moderate aortoiliac atherosclerotic calcification. Reproductive: Prostate is unremarkable. Other: No abdominal wall hernia or abnormality. No abdominopelvic ascites. Musculoskeletal: No acute bone abnormality. No lytic or blastic bone lesion. Osseous structures are age appropriate. IMPRESSION: 1. Status post left nephrectomy. Interval development of multiple hyperdense masses within the nephrectomy bed and within the left periaortic region suspicious for recurrent disease. 2. Interval development of a lobulated mildly hyperdense cortical mass within the lower pole the right kidney measuring at least 4.6 x 5.2 cm, not well characterized on this noncontrast examination. Intrapelvic extension of the lobulated mass within the lower pole the right kidney is not excluded. This is suspicious for a primary renal neoplasm or a metastasis. Contrast enhanced MRI examination is recommended for further evaluation. This can simultaneously assessed the above mention masses as well as the new right adrenal nodule. 3. Hyperdense material within the nondilated right renal pelvis likely representing blood product. 4. Interval development of an indeterminate 15 mm nodule within the right adrenal gland. 5. Interval development of a 18 mm mean diameter nodule within the left lower lobe suspicious for development of pulmonary metastatic disease in this patient with a history of known renal cell carcinoma. Dedicated CT imaging of the chest is recommended for further characterization 6. Extensive right coronary artery calcification. 7. Aortic atherosclerosis. Aortic Atherosclerosis (ICD10-I70.0). Electronically Signed   By: Dorethia Molt M.D.   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  50 mg, 2 weeks on,   # AUG 28th, 2025- ER- CT kidney protocol:  Interval development of multiple hyperdense masses within the nephrectomy  bed and within the left periaortic region suspicious for recurrent disease; Interval development of an indeterminate 15 mm nodule within the  right adrenal gland; Interval development of a 18 mm mean diameter nodule within the left lower lobe suspicious for development of pulmonary metastatic Disease. ALSO - RIGHT KIDNEY- least 4.6 x 5.2 cm, lower pole suspicious for a primary renal neoplasm or a metastasis.   # Give above concerns noted on imaging-recommend a CT of the chest noncontrast; and also recommend MRI of the abdomen with or without contrast for further evaluation.  Also discussed with the patient regarding getting a biopsy of the metastatic lesions.  Left a message for radiology.    # Iron deficient anemia secondary to hematuria- - #Recommend gentle iron [iron biglycinate; 28 mg ] 1 pill a day.  STOP ASPRIN.  Consider Venofer if not improved.  # # History of hypothyroidism-  on synthroid  100 mcg/d-   # HTN- well controlled-    # DISPOSITION: # CT Chest- ASAP MRI ASAP #  referral TO Dr. Francisca- re: recurrent kidney cancer # no labs-  # follow up in 2-3 weeks; MD- labs- cbc/cmp;LDH- iron studies; ferritin- venofer- Dr.B  # I reviewed the blood work- with the patient in detail; also reviewed the imaging independently [as summarized above]; and with the patient in detail.      Above plan of care was discussed with patient/family in detail.  My contact information was given to the patient/family.       Cindy JONELLE Joe, MD 03/20/2024 12:09 PM

## 2024-03-20 NOTE — Patient Instructions (Signed)
#  Recommend gentle iron  [iron  biglycinate; 28 mg ] 1 pill a day.  This pill is unlikely to cause stomach upset or cause constipation.  Available Over the counter or talk to pharmacist.

## 2024-03-24 ENCOUNTER — Telehealth: Payer: Self-pay | Admitting: *Deleted

## 2024-03-24 NOTE — Telephone Encounter (Signed)
 Pt having a CT guided abdominal mass biopsy. It is scheduled for [facility] 03/30/24. Arrival time is 7:30 am. Nothing to eat or drink after midnight. He must have a driver.He stopped his aspirin 81 mg last week. He will continue to hold it until after biopsy. Pt in agreement with above.

## 2024-03-24 NOTE — Progress Notes (Signed)
 Charles Mathis POUR, MD sent to Charles Mathis RAMAN Approved for CT Guided biopsy of LEFT RP mass (CT image 42, series 2).  Hx kidney cancer now with suspected mets.  HKM

## 2024-03-24 NOTE — Progress Notes (Signed)
 [facility] New Consult Visit  Patient Name: Charles Mathis, male   Patient DOB: October 28, 1970 Date of Service: 03/24/2024  Patient MRN: [id number] Provider Creating Note: Woodward Brought, MD  [phone] Primary Care Physician: Antonette Corrigan, NP-C  [address] [place] [id number] Additional Physicians/ Providers:    Impression/Recommendations   [name] is a 53 y.o. male with hypertension, diabetes mellitus type II, nephrectomy from renal cell carcinoma, hyperlipidemia, coronary artery disease, CVA and hypothyroidism who presents as a new patient for the evaluation of chronic kidney disease stage IIIA. Creatinine 1.68, GFR of 48.   Chronic Kidney Disease stage IIIA: with renal cell carcinoma, solitary kidney, diabetes, hypertension and nonsteroidal anti-inflammatory use (Advil and Goody powders) - avoid all nonsteroidal anti-inflammatory agents. Literature provided.  - Continue olmesartan  - not currently on an SGLT-2 inhibitor - not currently on a mineralocorticoid receptor antagonist - lab through the cancer center.  - patient understand that if he has his kidney removed, he will need renal replacement therapy. Discussed home therapies so patient can continue to work.   Hematuria: with recurrent renal cell carcinoma: discontinued aspirin.  - follow up with urology and oncology.  - will coordinate care  Hypertension with chronic kidney disease: 150/78 - patient had not taken his medications this morning.  - current regimen of amlodipine , carvedilol , and olmesartan   Diabetes mellitus type II with chronic kidney disease: hemoglobin A1c of 6.3% on 03/06/24. Noninsulin dependent.  - continue metformin .  - not currently on an SGLT-2 inhibitor.  - not currently on a GLP-1 agent.  - continue glucose control.   Anemia with chronic kidney disease: hemoglobin 9.9.  - follow up with oncology.  - did briefly mention ESA therapy.    Problem  List Patient Active Problem List  Diagnosis  . Chronic kidney disease stage 3A (HCC)  . Hematuria  . Diabetes mellitus with renal manifestations, type II or unspecified type, not stated as uncontrolled (HCC)  . Benign essential hypertension  . Anemia  . Hyperlipidemia  . Proteinuria  . Renal cell carcinoma (HCC)    Orders Placed This Encounter  Procedures  . POCT Urinalysis Auto w Scope   Problem List Items Addressed This Visit     Chronic kidney disease stage 3A (HCC)   Hematuria   Diabetes mellitus with renal manifestations, type II or unspecified type, not stated as uncontrolled (HCC)   Benign essential hypertension   Anemia   Hyperlipidemia   Proteinuria (Chronic)   Renal cell carcinoma (HCC)   Other Visit Diagnoses       Abnormal urine    -  Primary      Orders Placed This Encounter  . POCT Urinalysis Auto w Scope       Return in about 4 weeks (around 04/21/2024).   History of Present Illness   Chief Complaint  Patient presents with  . New     Mr. Charles Mathis is a 53 y.o. 1-White male who is being evaluated as a new patient today for chronic kidney disease stage IIIA, recurrent renal cell carcinoma, left nephrectomy, hematuria, hypertensive kidney disease and diabetic nephropathy.   Patient presents by himself. Patient went to the ED on 03/14/24 for right sided pain. Found to have recurrent renal cell carcinoma. He was then seen by oncology. He is pending his urology appointment. Patient understands that he will need dialysis if he has his right kidney removed.   Patient endorses having diabetes for 8-10  years. He states this is well controlled. He denies history of diabetic retinopathy or diabetic neuropathy. Patient denies history of insulin  use.   Patient states he has been diagnosed with hypertension for 5-7 years. He states his blood pressure is at goal. Patient states he has never had congestive heart failure, coronary artery disease, or  cerebrovascular disease. However this is mentioned in his chart. He states that this is incorrect. Patient also denies history of sleep apnea.   Patient endorses many years of taking Goody powders. He currently is taking Advil as needed.       The following portions of the patient's chart were reviewed in this encounter and updated as appropriate:  Tobacco  Allergies  Meds  Problems  Med Hx  Surg Hx  Fam Hx        Urine Studies   03/24/2024: urine microscopy: +hyaline casts, RBC nondysmorphic 10-20 per HPF  History    Medications   Current Outpatient Medications:  .  amLODIPine  (NORVASC ) 10 MG tablet, Take 10 mg by mouth 1 (one) time each day, Disp: , Rfl:  .  carvedilol  (COREG ) 25 MG tablet, Take 1 tablet (25 mg total) by mouth 2 (two) times daily with a meal., Disp: , Rfl:  .  ciprofloxacin  (CIPRO ) 500 MG tablet, Take 1 tablet (500 mg total) by mouth 2 (two) times daily for 10 days., Disp: , Rfl:  .  fluticasone  (FLONASE ) 50 MCG/ACT nasal spray, Place 2 sprays into both nostrils daily., Disp: , Rfl:  .  levothyroxine  (SYNTHROID , LEVOTHROID) 100 MCG tablet, Take 1 tablet (100 mcg total) by mouth daily., Disp: , Rfl:  .  metFORMIN  (GLUCOPHAGE ) 1000 MG tablet, Take 1 tablet (1,000 mg total) by mouth 2 (two) times daily with a meal., Disp: , Rfl:  .  montelukast  (SINGULAIR ) 10 MG tablet, Take 10 mg by mouth 1 (one) time each day, Disp: , Rfl:  .  olmesartan  (BENICAR ) 40 MG tablet, Take 40 mg by mouth 1 (one) time each day, Disp: , Rfl:  .  rosuvastatin  (CRESTOR ) 40 MG tablet, Take 40 mg by mouth 1 (one) time each day, Disp: , Rfl:  .  acetaminophen  (TYLENOL ) 325 MG tablet, Take 650 mg by mouth every 6 (six) hours as needed for headache., Disp: , Rfl:    Allergies Patient has no known allergies.  History Past Medical History:  Diagnosis Date  . Anemia   . Benign essential hypertension   . Cerebrovascular accident [facility])    right sides residual weakness  . Chronic kidney  disease stage 3A (HCC)   . Coronary atherosclerosis of unspecified type of vessel, native or graft   . Diabetes mellitus with renal manifestations, type II or unspecified type, not stated as uncontrolled (HCC)   . Hematuria   . Hyperlipidemia   . Hypothyroidism   . Proteinuria   . Renal cell carcinoma (HCC)   . Sleep apnea     Past Surgical History:  Procedure Laterality Date  . NEPHRECTOMY Left    Family History  Problem Relation Age of Onset  . Heart disease Mother   . Hypertension Father   . Heart disease Father    Social History   Tobacco Use  . Smoking status: Never  . Smokeless tobacco: Never  Substance Use Topics  . Alcohol use: Never        Physical Exam  Vitals BP 150/78 (BP Location: Right upper arm, Patient Position: Sitting)   Pulse 64   Temp 98.5 F  Ht 5' 7 (1.702 m)   Wt 221 lb (100 kg)   SpO2 99%   BMI 34.61 kg/m   Vitals reviewed. Constitutional: He is oriented to person, place, and time. He appears well-developed.  HEENT:  Head: Normocephalic and atraumatic. Mouth/Throat: Oropharynx is clear and moist.  Eyes: Pupils are equal, round, and reactive to light.  Neck: Neck supple.  Cardiovascular:  Normal rate and regular rhythm.           Pulmonary/Chest: Effort normal and breath sounds normal.  Abdominal: Soft.  Neurological: He is alert and oriented to person, place, and time.  Skin: Skin is warm and dry.     Laboratory Studies  Chemistry  Lab Units 03/24/24 0847  ALBUMIN   150 mg/L        No lab exists for component: IRON SATURATION, TRANSSATPER      Urine  Lab Units 03/24/24 0847  COLOR UA  Yellow  CLARITY UA  Clear  KETONES UA  Negative  PH UA  7.0  UROBILINOGEN UA  0.2        Woodward Brought, MD

## 2024-03-25 ENCOUNTER — Encounter: Payer: Self-pay | Admitting: Internal Medicine

## 2024-03-25 ENCOUNTER — Telehealth: Payer: Self-pay | Admitting: Pharmacist

## 2024-03-25 ENCOUNTER — Other Ambulatory Visit (HOSPITAL_COMMUNITY): Payer: Self-pay

## 2024-03-25 ENCOUNTER — Telehealth: Payer: Self-pay | Admitting: Pharmacy Technician

## 2024-03-25 DIAGNOSIS — C642 Malignant neoplasm of left kidney, except renal pelvis: Secondary | ICD-10-CM

## 2024-03-25 NOTE — Telephone Encounter (Signed)
 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 Patient will not receive or start medication until this is confirmed.   CMP from 03/06/2024 assessed, BMP and CBC from 03/14/2024 assessed, no relevant abnormalities identified.  Current medication list in Epic reviewed, no DDIs with cabozantinib or nivolumab identified:  Evaluated chart and one patient barrier to medication adherence identified.     Patient reports concern about financial impact of care, we have resources at the cancer center to ensure patient has access to Cabometyx and Opdivo.    Oral Oncology Clinic will continue to follow for insurance authorization, copayment issues, initial counseling and start date.   Signe JINNY Platt, PharmD Candidate 2026  [facility] Oral Chemotherapy [facility] [phone]  03/25/2024 12:36 PM

## 2024-03-25 NOTE — Telephone Encounter (Addendum)
 Oral Oncology Patient Advocate Encounter   Began application for assistance for Cabometyx through [employer].   Application will be submitted upon completion of necessary supporting documentation.   EASE phone number [phone].   Waiting to contact patient for required documents until he has had his biopsy appointment on 03/30/2024.  Yadiel Aubry (Patty) Chet Burnet, CPhT  [facility], Zelda Salmon, [facility] Oral Chemotherapy Patient Advocate Specialist III Phone: [phone]  Fax: [phone]

## 2024-03-26 ENCOUNTER — Telehealth: Payer: Self-pay | Admitting: *Deleted

## 2024-03-26 ENCOUNTER — Other Ambulatory Visit: Payer: Self-pay | Admitting: Urology

## 2024-03-26 ENCOUNTER — Encounter: Payer: Self-pay | Admitting: Internal Medicine

## 2024-03-26 DIAGNOSIS — N2889 Other specified disorders of kidney and ureter: Secondary | ICD-10-CM

## 2024-03-26 NOTE — Telephone Encounter (Signed)
 Patient called over here stating that Dr. Rennie wanted to have the patient to get MRI tomorrow and CT and Monday a biopsy.  Per the patient he says that he got called and said that the CT has to be canceled because the machine is down.  Wants to go what              Dr. Rennie.

## 2024-03-27 ENCOUNTER — Other Ambulatory Visit: Payer: Self-pay | Admitting: Radiology

## 2024-03-27 ENCOUNTER — Encounter: Payer: Self-pay | Admitting: Internal Medicine

## 2024-03-27 ENCOUNTER — Inpatient Hospital Stay: Admission: RE | Admit: 2024-03-27 | Payer: Self-pay | Source: Ambulatory Visit

## 2024-03-27 ENCOUNTER — Encounter: Payer: Self-pay | Admitting: *Deleted

## 2024-03-27 ENCOUNTER — Other Ambulatory Visit

## 2024-03-27 DIAGNOSIS — R1900 Intra-abdominal and pelvic swelling, mass and lump, unspecified site: Secondary | ICD-10-CM

## 2024-03-29 NOTE — H&P (Signed)
 Chief Complaint: Patient was seen in consultation today for left retroperitoneal mass   Procedure: Abdominal mass biopsy   Referring Physician(s): Rennie Cindy SAUNDERS  Supervising Physician: Vanice Revel  Patient Status: [facility] - Out-pt  History of Present Illness: Charles Mathis is a 53 y.o. male with a history of HTN, T2DM, CKD IIIa, and renal cell carcinoma s/p left nephrectomy who presented to the ED on 8/30 with concerns for hematuria. CT Renal Stone study completed at that time which showed interval development of multiple hyperdense masses within the left nephrectomy bed and within the left periaortic region suspicious for recurrent disease and lobulated mildly hyperdense cortical mass within the lower pole of the right kidney suspicious for primary renal neoplasm or metastasis. Patient met with Dr. Rennie on 9/5 for initial evaluation who recommended MR abdomen for further evaluation. Unfortunately patient was unable to receive this prior to his biopsy due to the machine being down. He was subsequently referred to IR for biopsy of retroperitoneal lesions.   He presents today with his wife and son. States that he has been doing well overall. Admits to some decreased appetite and minimal weight loss, but is otherwise without complaints. Denies any hematuria, difficulty urinating, flank pain, abdominal pain, nausea/vomiting, fever/chills. NPO since midnight. All questions and concerns answered at the bedside.   Code Status: Full Code  Past Medical History:  Diagnosis Date   Allergy    Diabetes mellitus without complication (HCC)    Hypertension    Renal cancer, left (HCC) 01/2019   Left Nephrectomy    Past Surgical History:  Procedure Laterality Date   CYSTOSCOPY  02/06/2019   Procedure: CYSTOSCOPY;  Surgeon: Francisca Redell BROCKS, MD;  Location: [facility] ORS;  Service: Urology;;   LAPAROSCOPIC NEPHRECTOMY, HAND ASSISTED Left 02/06/2019   Procedure: HAND ASSISTED LAPAROSCOPIC  LEFT RADICAL NEPHRECTOMY;  Surgeon: Francisca Redell BROCKS, MD;  Location: [facility] ORS;  Service: Urology;  Laterality: Left;   NO PAST SURGERIES      Allergies: Patient has no known allergies.  Medications: Prior to Admission medications   Medication Sig Start Date End Date Taking? Authorizing Provider  acetaminophen  (TYLENOL ) 325 MG tablet Take 650 mg by mouth every 6 (six) hours as needed for headache.    [provider]  amLODipine  (NORVASC ) 10 MG tablet Take 1 tablet (10 mg total) by mouth daily. 01/28/24 01/27/25  Antonette Angeline ORN, NP  aspirin EC 81 MG tablet Take 81 mg by mouth daily.    [provider]  blood glucose meter kit and supplies Dispense based on patient and insurance preference. Use to check blood sugar daily as directed. DX: E11.9. 12/05/21   Antonette Angeline ORN, NP  carvedilol  (COREG ) 25 MG tablet Take 1 tablet (25 mg total) by mouth 2 (two) times daily with a meal. 11/27/23 05/15/24  Antonette Angeline ORN, NP  ciprofloxacin  (CIPRO ) 500 MG tablet Take 1 tablet (500 mg total) by mouth 2 (two) times daily for 10 days. 03/14/24 03/29/24  Cyrena Mylar, MD  fluticasone  (FLONASE ) 50 MCG/ACT nasal spray Place 2 sprays into both nostrils daily. 12/13/23   Antonette Angeline ORN, NP  levothyroxine  (SYNTHROID ) 100 MCG tablet Take 1 tablet (100 mcg total) by mouth daily. 11/27/23   Antonette Angeline ORN, NP  metFORMIN  (GLUCOPHAGE ) 1000 MG tablet Take 1 tablet (1,000 mg total) by mouth 2 (two) times daily with a meal. 09/18/23   Baity, Angeline ORN, NP  montelukast  (SINGULAIR ) 10 MG tablet Take 1 tablet (10 mg total) by  mouth once daily. 09/25/23   Antonette Angeline ORN, NP  olmesartan  (BENICAR ) 40 MG tablet Take 1 tablet (40 mg total) by mouth daily. 09/18/23   Antonette Angeline ORN, NP  rosuvastatin  (CRESTOR ) 40 MG tablet Take 1 tablet (40 mg total) by mouth daily. 03/09/24   Antonette Angeline ORN, NP     Family History  Problem Relation Age of Onset   Diabetes Mother    Heart disease Mother    Mental illness Mother    Thyroid   disease Mother    Heart disease Father    Diabetes Father    Mental illness Father     Social History   Socioeconomic History   Marital status: Married    Spouse name: Not on file   Number of children: Not on file   Years of education: Not on file   Highest education level: GED or equivalent  Occupational History   Not on file  Tobacco Use   Smoking status: Never   Smokeless tobacco: Never  Vaping Use   Vaping status: Never Used  Substance and Sexual Activity   Alcohol use: No   Drug use: No   Sexual activity: Yes    Birth control/protection: Other-see comments    Comment: mutually monogamous relationship partner postmenopausal  Other Topics Concern   Not on file  Social History Narrative   [place] with [place]; son [downs syndrome]; no smoking/ no alcohol; works for Engineer, water.    Social Drivers of Corporate investment banker Strain: High Risk (03/05/2024)   Overall Financial Resource Strain (CARDIA)    Difficulty of Paying Living Expenses: Hard  Food Insecurity: No Food Insecurity (03/20/2024)   Hunger Vital Sign    Worried About Running Out of Food in the Last Year: Never true    Ran Out of Food in the Last Year: Never true  Recent Concern: Food Insecurity - Food Insecurity Present (03/05/2024)   Hunger Vital Sign    Worried About Running Out of Food in the Last Year: Sometimes true    Ran Out of Food in the Last Year: Sometimes true  Transportation Needs: No Transportation Needs (03/20/2024)   PRAPARE - Administrator, Civil Service (Medical): No    Lack of Transportation (Non-Medical): No  Physical Activity: Insufficiently Active (03/05/2024)   Exercise Vital Sign    Days of Exercise per Week: 5 days    Minutes of Exercise per Session: 20 min  Stress: No Stress Concern Present (03/05/2024)   [employer] of Occupational Health - Occupational Stress Questionnaire    Feeling of Stress: Only a [place]  Social Connections: Unknown  (03/05/2024)   Social Connection and Isolation Panel    Frequency of Communication with Friends and Family: Once a week    Frequency of Social Gatherings with Friends and Family: Patient declined    Attends Religious Services: Never    Database administrator or Organizations: No    Attends Engineer, structural: Not on file    Marital Status: Married    Review of Systems  Constitutional:  Positive for appetite change.  Genitourinary:  Negative for difficulty urinating, dysuria, flank pain and hematuria.  Denies any N/V, chest pain, shortness of breath, fevers/chills. All other ROS negative.  Vital Signs: BP (!) 152/86   Pulse 69   Temp 98 F (36.7 C) (Temporal)   Resp 19   Ht 5' 7 (1.702 m)   Wt 216 lb 11.2 oz (98.3 kg)  SpO2 97%   BMI 33.94 kg/m    Physical Exam Constitutional:      Appearance: Normal appearance.  HENT:     Mouth/Throat:     Mouth: Mucous membranes are moist.     Pharynx: Oropharynx is clear.  Cardiovascular:     Rate and Rhythm: Normal rate and regular rhythm.     Heart sounds: Normal heart sounds.  Pulmonary:     Effort: Pulmonary effort is normal.     Breath sounds: Normal breath sounds.  Abdominal:     General: Abdomen is flat.     Palpations: Abdomen is soft.     Tenderness: There is no abdominal tenderness.  Skin:    General: Skin is warm and dry.  Neurological:     Mental Status: He is alert and oriented to person, place, and time.  Psychiatric:        Behavior: Behavior normal.     Imaging: CT Renal Stone Study Result Date: 03/14/2024 CLINICAL DATA:  Hematuria, flank pain EXAM: CT ABDOMEN AND PELVIS WITHOUT CONTRAST TECHNIQUE: Multidetector CT imaging of the abdomen and pelvis was performed following the standard protocol without IV contrast. RADIATION DOSE REDUCTION: This exam was performed according to the departmental dose-optimization program which includes automated exposure control, adjustment of the mA and/or kV  according to patient size and/or use of iterative reconstruction technique. COMPARISON:  09/12/2020 FINDINGS: Lower chest: Mean 18 mm within the left lower lobe suspicious for development of pulmonary metastatic disease in this patient with a history of known renal cell carcinoma. No pleural effusion. Extensive right coronary artery calcification. Global cardiac size within normal limits. Hepatobiliary: Layering subtle hyperdensity within the gallbladder lumen may reflect layering sludge. No superimposed pericholecystic inflammatory change. Liver unremarkable on this noncontrast examination. No intra or extrahepatic biliary ductal dilation. Pancreas: Unremarkable Spleen: Unremarkable Adrenals/Urinary Tract: Interval development of an indeterminate 15 mm nodule within the right adrenal gland. Left adrenal gland is unremarkable. Status post left nephrectomy. Interval development of multiple hyperdense masses within the nephrectomy bed measuring up to 17 x 27 mm suspicious for recurrent disease. There is compensatory hypertrophy of the right kidney. There is development of a lobulated mildly hyperdense cortical mass within the lower pole the right kidney measuring at least 4.6 x 5.2 cm, not well characterized on this noncontrast examination. There is hyperdense material seen within the nondilated right renal pelvis likely representing blood product. Intrapelvic extension of the lobulated mass within the lower pole the right kidney is not excluded. No hydronephrosis. Mild right perinephric stranding, nonspecific. The bladder is decompressed and is unremarkable. Stomach/Bowel: Stomach is within normal limits. Appendix is not clearly identified, however, no secondary signs of appendicitis within the right lower quadrant. No evidence of bowel wall thickening, distention, or inflammatory changes. Vascular/Lymphatic: 23 mm rounded mass is seen within the left periaortic region (42/2) in keeping with pathologic adenopathy or  a retroperitoneal metastasis given the history of a renal cell carcinoma. No additional pathologic adenopathy identified. Moderate aortoiliac atherosclerotic calcification. Reproductive: Prostate is unremarkable. Other: No abdominal wall hernia or abnormality. No abdominopelvic ascites. Musculoskeletal: No acute bone abnormality. No lytic or blastic bone lesion. Osseous structures are age appropriate. IMPRESSION: 1. Status post left nephrectomy. Interval development of multiple hyperdense masses within the nephrectomy bed and within the left periaortic region suspicious for recurrent disease. 2. Interval development of a lobulated mildly hyperdense cortical mass within the lower pole the right kidney measuring at least 4.6 x 5.2 cm, not well characterized on  this noncontrast examination. Intrapelvic extension of the lobulated mass within the lower pole the right kidney is not excluded. This is suspicious for a primary renal neoplasm or a metastasis. Contrast enhanced MRI examination is recommended for further evaluation. This can simultaneously assessed the above mention masses as well as the new right adrenal nodule. 3. Hyperdense material within the nondilated right renal pelvis likely representing blood product. 4. Interval development of an indeterminate 15 mm nodule within the right adrenal gland. 5. Interval development of a 18 mm mean diameter nodule within the left lower lobe suspicious for development of pulmonary metastatic disease in this patient with a history of known renal cell carcinoma. Dedicated CT imaging of the chest is recommended for further characterization 6. Extensive right coronary artery calcification. 7. Aortic atherosclerosis. Aortic Atherosclerosis (ICD10-I70.0). Electronically Signed   By: Dorethia Molt M.D.   On: 03/14/2024 03:04    Labs:  CBC: Recent Labs    09/02/23 0856 03/06/24 1556 03/14/24 0002 03/30/24 0751  WBC 9.6 7.6 9.2 9.1  HGB 11.3* 9.6* 9.9* 9.8*  HCT 35.7*  31.3* 32.1* 31.6*  PLT 283 273 300 323    COAGS: No results for input(s): INR, APTT in the last 8760 hours.  BMP: Recent Labs    09/02/23 0856 03/06/24 1556 03/14/24 0002  NA 139 137 137  K 3.2* 3.4* 3.6  CL 101 100 102  CO2 27 24 23   GLUCOSE 110* 130* 102*  BUN 21* 19 23*  CALCIUM  9.7 9.3 9.5  CREATININE 1.26* 1.51* 1.68*  GFRNONAA >60 55* 48*    LIVER FUNCTION TESTS: Recent Labs    09/02/23 0856 03/06/24 1556  BILITOT 0.7 0.4  AST 29 24  ALT 26 15  ALKPHOS 54 61  PROT 8.2* 7.6  ALBUMIN  4.1 3.6    TUMOR MARKERS: No results for input(s): AFPTM, CEA, CA199, CHROMGRNA in the last 8760 hours.  Assessment and Plan:  Retroperitoneal masses: Charles Mathis is a 53 y.o. male with a history of CKD IIIa and renal cell carcinoma s/p left nephrectomy who recently presented to the ED with hematuria. CT Renal study revealed concern for recurrent disease. Patient referred to IR for possible biopsy. Procedure to be performed under moderate sedation.  Risks and benefits of retroperitoneal biopsy was discussed with the patient and/or patient's family including, but not limited to bleeding, infection, damage to adjacent structures or low yield requiring additional tests.  All of the questions were answered and there is agreement to proceed.  Consent signed and in chart.   Thank you for this interesting consult. I greatly enjoyed meeting Charles Mathis and look forward to participating in their care. A copy of this report was sent to the requesting provider on this date.  Electronically Signed: Glennon CHRISTELLA Bal, PA-C 03/30/2024, 8:28 AM   I spent a total of 40 minutes in face to face clinical consultation, greater than 50% of which was counseling/coordinating care for biopsy of left retroperitoneal mass.

## 2024-03-30 ENCOUNTER — Other Ambulatory Visit: Payer: Self-pay

## 2024-03-30 ENCOUNTER — Ambulatory Visit: Admission: RE | Admit: 2024-03-30 | Payer: Self-pay | Source: Ambulatory Visit

## 2024-03-30 ENCOUNTER — Ambulatory Visit
Admission: RE | Admit: 2024-03-30 | Discharge: 2024-03-30 | Disposition: A | Discharge status: Home / Self Care | Payer: Self-pay | Source: Ambulatory Visit | Attending: Internal Medicine | Admitting: Internal Medicine

## 2024-03-30 DIAGNOSIS — N200 Calculus of kidney: Secondary | ICD-10-CM | POA: Insufficient documentation

## 2024-03-30 DIAGNOSIS — Z85528 Personal history of other malignant neoplasm of kidney: Secondary | ICD-10-CM | POA: Insufficient documentation

## 2024-03-30 DIAGNOSIS — R1909 Other intra-abdominal and pelvic swelling, mass and lump: Secondary | ICD-10-CM | POA: Insufficient documentation

## 2024-03-30 DIAGNOSIS — Z7984 Long term (current) use of oral hypoglycemic drugs: Secondary | ICD-10-CM | POA: Insufficient documentation

## 2024-03-30 DIAGNOSIS — E1122 Type 2 diabetes mellitus with diabetic chronic kidney disease: Secondary | ICD-10-CM | POA: Insufficient documentation

## 2024-03-30 DIAGNOSIS — N1831 Chronic kidney disease, stage 3a: Secondary | ICD-10-CM | POA: Insufficient documentation

## 2024-03-30 DIAGNOSIS — R1900 Intra-abdominal and pelvic swelling, mass and lump, unspecified site: Secondary | ICD-10-CM

## 2024-03-30 DIAGNOSIS — R319 Hematuria, unspecified: Secondary | ICD-10-CM | POA: Insufficient documentation

## 2024-03-30 DIAGNOSIS — C642 Malignant neoplasm of left kidney, except renal pelvis: Secondary | ICD-10-CM

## 2024-03-30 DIAGNOSIS — I129 Hypertensive chronic kidney disease with stage 1 through stage 4 chronic kidney disease, or unspecified chronic kidney disease: Secondary | ICD-10-CM | POA: Insufficient documentation

## 2024-03-30 DIAGNOSIS — R590 Localized enlarged lymph nodes: Secondary | ICD-10-CM | POA: Insufficient documentation

## 2024-03-30 LAB — CBC
HCT: 31.6 % — ABNORMAL LOW (ref 39.0–52.0)
Hemoglobin: 9.8 g/dL — ABNORMAL LOW (ref 13.0–17.0)
MCH: 23.2 pg — ABNORMAL LOW (ref 26.0–34.0)
MCHC: 31 g/dL (ref 30.0–36.0)
MCV: 74.9 fL — ABNORMAL LOW (ref 80.0–100.0)
Platelets: 323 K/uL (ref 150–400)
RBC: 4.22 MIL/uL (ref 4.22–5.81)
RDW: 15.5 % (ref 11.5–15.5)
WBC: 9.1 K/uL (ref 4.0–10.5)
nRBC: 0 % (ref 0.0–0.2)

## 2024-03-30 LAB — PROTIME-INR
INR: 1 (ref 0.8–1.2)
Prothrombin Time: 13.8 s (ref 11.4–15.2)

## 2024-03-30 LAB — GLUCOSE, CAPILLARY: Glucose-Capillary: 131 mg/dL — ABNORMAL HIGH (ref 70–99)

## 2024-03-30 MED ORDER — LIDOCAINE 1 % OPTIME INJ - NO CHARGE
10.0000 mL | Freq: Once | INTRAMUSCULAR | Status: AC
  Administered 2024-03-30: 10 mL via SUBCUTANEOUS
  Filled 2024-03-30: qty 10

## 2024-03-30 MED ORDER — FENTANYL CITRATE (PF) 100 MCG/2ML IJ SOLN
INTRAMUSCULAR | Status: AC
  Filled 2024-03-30: qty 2

## 2024-03-30 MED ORDER — SODIUM CHLORIDE 0.9 % IV SOLN: INTRAVENOUS | Status: DC

## 2024-03-30 MED ORDER — FENTANYL CITRATE (PF) 100 MCG/2ML IJ SOLN
INTRAMUSCULAR | Status: AC | PRN
  Administered 2024-03-30 (×2): 50 ug via INTRAVENOUS

## 2024-03-30 MED ORDER — MIDAZOLAM HCL 2 MG/2ML IJ SOLN
INTRAMUSCULAR | Status: AC
  Filled 2024-03-30: qty 2

## 2024-03-30 MED ORDER — MIDAZOLAM HCL 2 MG/2ML IJ SOLN
INTRAMUSCULAR | Status: AC | PRN
  Administered 2024-03-30 (×2): 1 mg via INTRAVENOUS

## 2024-03-30 NOTE — Procedures (Signed)
 Interventional Radiology Procedure Note  Procedure: CT BX LEFT RP MET ADENOPATHY    Complications: None  Estimated Blood Loss:  MIN  Findings: 46 G CORES IN FORMALIN    M. TREVOR Elwin Tsou, MD

## 2024-03-30 NOTE — Progress Notes (Signed)
 Patient clinically stable post CT Abdominal mass biopsy per Dr Vanice, tolerated well. Vitals stable pre and post procedure. Received Versed  2 mg along with Fentanyl  100 mcg IV for procedure. Report given to Carlyon Louder RN post procedure/specials/16

## 2024-04-01 LAB — SURGICAL PATHOLOGY

## 2024-04-02 ENCOUNTER — Encounter: Payer: Self-pay | Admitting: Internal Medicine

## 2024-04-03 ENCOUNTER — Encounter: Payer: Self-pay | Admitting: Internal Medicine

## 2024-04-03 ENCOUNTER — Ambulatory Visit: Admitting: Pharmacist

## 2024-04-03 ENCOUNTER — Inpatient Hospital Stay (HOSPITAL_BASED_OUTPATIENT_CLINIC_OR_DEPARTMENT_OTHER): Payer: Self-pay | Admitting: Internal Medicine

## 2024-04-03 VITALS — BP 151/89 | HR 73 | Temp 97.8°F | Resp 16 | Wt 217.3 lb

## 2024-04-03 DIAGNOSIS — N2889 Other specified disorders of kidney and ureter: Secondary | ICD-10-CM | POA: Diagnosis not present

## 2024-04-03 DIAGNOSIS — C642 Malignant neoplasm of left kidney, except renal pelvis: Secondary | ICD-10-CM

## 2024-04-03 NOTE — Progress Notes (Signed)
 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 Sniskinski]; July CT 2020- mid anterior left kidney measuring 8.6 x 8.1 x 9.4 cm; CT chest negative. #April 06, 2019-ADJUVANT Sutent  50 mg 2 weeks on 1 week off.    # SEP 25th, 2025- RECURRENCE- s/p Bx- CABO- + NIVO    # DM- 2- OHA/ HTN  DIAGNOSIS: Left kidney cancer  STAGE: 3       ;GOALS: Cure  CURRENT/MOST RECENT THERAPY: Sutent     History of kidney cancer  02/26/2019 Initial Diagnosis   Cancer of left kidney (HCC)   Cancer of left kidney (HCC)  12/16/2020 Initial Diagnosis   Cancer of left kidney (HCC)   04/07/2024 -  Chemotherapy   Patient is on Treatment Plan : RENAL CELL Nivolumab (480) q28d       HISTORY OF PRESENTING ILLNESS: Patient ambulating- independently.   Alone/Accompanied by family.   Charles Mathis 53 y.o.  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    Biopsy was uneventful.  Denies any worsening hematuria.  However having difficulty with oral iron .  His appetite is good.  No significant weight loss.  Denies any nausea or vomiting.  Any worsening pain.  No headaches.   Review of Systems  Constitutional:  Positive for malaise/fatigue. Negative for chills, diaphoresis, fever and weight loss.  HENT:  Negative for nosebleeds and sore throat.   Eyes:  Negative for double vision.  Respiratory:  Negative for cough, hemoptysis, sputum production, shortness of breath and  wheezing.   Cardiovascular:  Negative for chest pain, palpitations, orthopnea and leg swelling.  Gastrointestinal:  Negative for abdominal pain, blood in stool, constipation, diarrhea, heartburn, melena, nausea and vomiting.  Genitourinary:  Negative for dysuria, frequency and urgency.  Musculoskeletal:  Positive for back pain. Negative for joint pain.  Skin: Negative.  Negative for itching and rash.  Neurological:  Negative for dizziness, tingling, focal weakness, weakness and headaches.  Endo/Heme/Allergies:  Does not bruise/bleed easily.  Psychiatric/Behavioral:  Negative for depression. The patient is not nervous/anxious and does not have insomnia.     MEDICAL HISTORY:  Past Medical History:  Diagnosis Date   Allergy    Diabetes mellitus without complication (HCC)    Hypertension    Renal cancer, left (HCC) 01/2019   Left Nephrectomy    SURGICAL HISTORY: Past Surgical History:  Procedure Laterality Date   CYSTOSCOPY  02/06/2019   Procedure: CYSTOSCOPY;  Surgeon: Francisca Redell BROCKS, MD;  Location: [facility] ORS;  Service: Urology;;   LAPAROSCOPIC NEPHRECTOMY, HAND ASSISTED Left 02/06/2019   Procedure: HAND ASSISTED LAPAROSCOPIC LEFT RADICAL NEPHRECTOMY;  Surgeon: Francisca Redell BROCKS, MD;  Location: [facility] ORS;  Service: Urology;  Laterality: Left;   NO PAST SURGERIES      SOCIAL HISTORY: Social History   Socioeconomic History   Marital status: Married    Spouse name: Not on file   Number of children: Not on file   Years of education: Not on file   Highest education level: GED or equivalent  Occupational History  Not on file  Tobacco Use   Smoking status: Never   Smokeless tobacco: Never  Vaping Use   Vaping status: Never Used  Substance and Sexual Activity   Alcohol use: No   Drug use: No   Sexual activity: Yes    Birth control/protection: Other-see comments    Comment: mutually monogamous relationship partner postmenopausal  Other Topics Concern   Not on file  Social  History Narrative   In [place]/ with wife; son [downs syndrome]; no smoking/ no alcohol; works for Engineer, water.    Social Drivers of Corporate investment banker Strain: High Risk (03/05/2024)   Overall Financial Resource Strain (CARDIA)    Difficulty of Paying Living Expenses: Hard  Food Insecurity: No Food Insecurity (03/20/2024)   Hunger Vital Sign    Worried About Running Out of Food in the Last Year: Never true    Ran Out of Food in the Last Year: Never true  Recent Concern: Food Insecurity - Food Insecurity Present (03/05/2024)   Hunger Vital Sign    Worried About Running Out of Food in the Last Year: Sometimes true    Ran Out of Food in the Last Year: Sometimes true  Transportation Needs: No Transportation Needs (03/20/2024)   PRAPARE - Administrator, Civil Service (Medical): No    Lack of Transportation (Non-Medical): No  Physical Activity: Insufficiently Active (03/05/2024)   Exercise Vital Sign    Days of Exercise per Week: 5 days    Minutes of Exercise per Session: 20 min  Stress: No Stress Concern Present (03/05/2024)   [employer] of Occupational Health - Occupational Stress Questionnaire    Feeling of Stress: Only a [place]  Social Connections: Unknown (03/05/2024)   Social Connection and Isolation Panel    Frequency of Communication with Friends and Family: Once a week    Frequency of Social Gatherings with Friends and Family: Patient declined    Attends Religious Services: Never    Database administrator or Organizations: No    Attends Engineer, structural: Not on file    Marital Status: Married  Catering manager Violence: Not At Risk (03/20/2024)   Humiliation, Afraid, Rape, and Kick questionnaire    Fear of Current or Ex-Partner: No    Emotionally Abused: No    Physically Abused: No    Sexually Abused: No    FAMILY HISTORY: Family History  Problem Relation Age of Onset   Diabetes Mother    Heart disease Mother    Mental  illness Mother    Thyroid  disease Mother    Heart disease Father    Diabetes Father    Mental illness Father     ALLERGIES:  has no known allergies.  MEDICATIONS:  Current Outpatient Medications  Medication Sig Dispense Refill   acetaminophen  (TYLENOL ) 325 MG tablet Take 650 mg by mouth every 6 (six) hours as needed for headache.     amLODipine  (NORVASC ) 10 MG tablet Take 1 tablet (10 mg total) by mouth daily. 90 tablet 0   blood glucose meter kit and supplies Dispense based on patient and insurance preference. Use to check blood sugar daily as directed. DX: E11.9. 1 each 0   carvedilol  (COREG ) 25 MG tablet Take 1 tablet (25 mg total) by mouth 2 (two) times daily with a meal. 180 tablet 1   fluticasone  (FLONASE ) 50 MCG/ACT nasal spray Place 2 sprays into both nostrils daily. 48 g 0   levothyroxine  (SYNTHROID ) 100 MCG tablet  Take 1 tablet (100 mcg total) by mouth daily. 90 tablet 1   loratadine (CLARITIN) 10 MG tablet Take 10 mg by mouth daily.     metFORMIN  (GLUCOPHAGE ) 1000 MG tablet Take 1 tablet (1,000 mg total) by mouth 2 (two) times daily with a meal. 180 tablet 1   montelukast  (SINGULAIR ) 10 MG tablet Take 1 tablet (10 mg total) by mouth once daily. 90 tablet 1   olmesartan  (BENICAR ) 40 MG tablet Take 1 tablet (40 mg total) by mouth daily. 90 tablet 1   rosuvastatin  (CRESTOR ) 40 MG tablet Take 1 tablet (40 mg total) by mouth daily. 90 tablet 3   aspirin EC 81 MG tablet Take 81 mg by mouth daily. (Patient not taking: Reported on 04/03/2024)     No current facility-administered medications for this visit.    PHYSICAL EXAMINATION:   Vitals:   04/03/24 0954  BP: (!) 151/89  Pulse: 73  Resp: 16  Temp: 97.8 F (36.6 C)  SpO2: 98%   Filed Weights   04/03/24 0954  Weight: 217 lb 4.8 oz (98.6 kg)    Physical Exam Vitals and nursing note reviewed.  HENT:     Head: Normocephalic and atraumatic.     Mouth/Throat:     Pharynx: Oropharynx is clear.  Eyes:     Extraocular  Movements: Extraocular movements intact.     Pupils: Pupils are equal, round, and reactive to light.  Cardiovascular:     Rate and Rhythm: Normal rate and regular rhythm.  Pulmonary:     Comments: Decreased breath sounds bilaterally.  Abdominal:     Palpations: Abdomen is soft.  Musculoskeletal:        General: Normal range of motion.     Cervical back: Normal range of motion.  Skin:    General: Skin is warm.  Neurological:     General: No focal deficit present.     Mental Status: He is alert and oriented to person, place, and time.  Psychiatric:        Behavior: Behavior normal.        Judgment: Judgment normal.     LABORATORY DATA:  I have reviewed the data as listed Lab Results  Component Value Date   WBC 9.1 03/30/2024   HGB 9.8 (L) 03/30/2024   HCT 31.6 (L) 03/30/2024   MCV 74.9 (L) 03/30/2024   PLT 323 03/30/2024   Recent Labs    09/02/23 0856 03/06/24 1556 03/14/24 0002  NA 139 137 137  K 3.2* 3.4* 3.6  CL 101 100 102  CO2 27 24 23   GLUCOSE 110* 130* 102*  BUN 21* 19 23*  CREATININE 1.26* 1.51* 1.68*  CALCIUM  9.7 9.3 9.5  GFRNONAA >60 55* 48*  PROT 8.2* 7.6  --   ALBUMIN  4.1 3.6  --   AST 29 24  --   ALT 26 15  --   ALKPHOS 54 61  --   BILITOT 0.7 0.4  --     RADIOGRAPHIC STUDIES: I have personally reviewed the radiological images as listed and agreed with the findings in the report. CT ABDOMINAL MASS BIOPSY Result Date: 03/30/2024 INDICATION: No cell carcinoma, left retroperitoneal metastatic adenopathy EXAM: CT BIOPSY LEFT RETROPERITONEAL ADENOPATHY MEDICATIONS: 1% LIDOCAINE  LOCAL ANESTHESIA/SEDATION: Moderate (conscious) sedation was employed during this procedure. A total of Versed  2.0 mg and Fentanyl  100 mcg was administered intravenously by the radiology nurse. Total intra-service moderate Sedation Time: 12 minutes. The patient's level of consciousness and vital signs were monitored  continuously by radiology nursing throughout the procedure under  my direct supervision. COMPLICATIONS: None immediate. PROCEDURE: Informed written consent was obtained from the patient after a thorough discussion of the procedural risks, benefits and alternatives. All questions were addressed. Maximal Sterile Barrier Technique was utilized including caps, mask, sterile gowns, sterile gloves, sterile drape, hand hygiene and skin antiseptic. A timeout was performed prior to the initiation of the procedure. Previous imaging reviewed. Patient positioned prone. Noncontrast localization CT performed. The enlarged left retroperitoneal adenopathy was localized and marked for biopsy. Under sterile conditions and local anesthesia, the 17 gauge 11.8 cm guide needle was advanced from a posterior left oblique approach to the adenopathy. Needle position confirmed on the edge of the adenopathy by CT. 18 gauge core biopsies obtained. These were placed in formalin. Samples were somewhat fragmented. Postprocedure imaging demonstrates a small amount of Peri lesion retroperitoneal hemorrhage. No large hematoma. Patient tolerated biopsy well. IMPRESSION: Successful CT-guided left retroperitoneal adenopathy 18 gauge core biopsy Electronically Signed   By: CHRISTELLA.  Shick M.D.   On: 03/30/2024 10:21   CT Renal Stone Study Result Date: 03/14/2024 CLINICAL DATA:  Hematuria, flank pain EXAM: CT ABDOMEN AND PELVIS WITHOUT CONTRAST TECHNIQUE: Multidetector CT imaging of the abdomen and pelvis was performed following the standard protocol without IV contrast. RADIATION DOSE REDUCTION: This exam was performed according to the departmental dose-optimization program which includes automated exposure control, adjustment of the mA and/or kV according to patient size and/or use of iterative reconstruction technique. COMPARISON:  09/12/2020 FINDINGS: Lower chest: Mean 18 mm within the left lower lobe suspicious for development of pulmonary metastatic disease in this patient with a history of known renal cell  carcinoma. No pleural effusion. Extensive right coronary artery calcification. Global cardiac size within normal limits. Hepatobiliary: Layering subtle hyperdensity within the gallbladder lumen may reflect layering sludge. No superimposed pericholecystic inflammatory change. Liver unremarkable on this noncontrast examination. No intra or extrahepatic biliary ductal dilation. Pancreas: Unremarkable Spleen: Unremarkable Adrenals/Urinary Tract: Interval development of an indeterminate 15 mm nodule within the right adrenal gland. Left adrenal gland is unremarkable. Status post left nephrectomy. Interval development of multiple hyperdense masses within the nephrectomy bed measuring up to 17 x 27 mm suspicious for recurrent disease. There is compensatory hypertrophy of the right kidney. There is development of a lobulated mildly hyperdense cortical mass within the lower pole the right kidney measuring at least 4.6 x 5.2 cm, not well characterized on this noncontrast examination. There is hyperdense material seen within the nondilated right renal pelvis likely representing blood product. Intrapelvic extension of the lobulated mass within the lower pole the right kidney is not excluded. No hydronephrosis. Mild right perinephric stranding, nonspecific. The bladder is decompressed and is unremarkable. Stomach/Bowel: Stomach is within normal limits. Appendix is not clearly identified, however, no secondary signs of appendicitis within the right lower quadrant. No evidence of bowel wall thickening, distention, or inflammatory changes. Vascular/Lymphatic: 23 mm rounded mass is seen within the left periaortic region (42/2) in keeping with pathologic adenopathy or a retroperitoneal metastasis given the history of a renal cell carcinoma. No additional pathologic adenopathy identified. Moderate aortoiliac atherosclerotic calcification. Reproductive: Prostate is unremarkable. Other: No abdominal wall hernia or abnormality. No  abdominopelvic ascites. Musculoskeletal: No acute bone abnormality. No lytic or blastic bone lesion. Osseous structures are age appropriate. IMPRESSION: 1. Status post left nephrectomy. Interval development of multiple hyperdense masses within the nephrectomy bed and within the left periaortic region suspicious for recurrent disease. 2. Interval development of a lobulated  mildly hyperdense cortical mass within the lower pole the right kidney measuring at least 4.6 x 5.2 cm, not well characterized on this noncontrast examination. Intrapelvic extension of the lobulated mass within the lower pole the right kidney is not excluded. This is suspicious for a primary renal neoplasm or a metastasis. Contrast enhanced MRI examination is recommended for further evaluation. This can simultaneously assessed the above mention masses as well as the new right adrenal nodule. 3. Hyperdense material within the nondilated right renal pelvis likely representing blood product. 4. Interval development of an indeterminate 15 mm nodule within the right adrenal gland. 5. Interval development of a 18 mm mean diameter nodule within the left lower lobe suspicious for development of pulmonary metastatic disease in this patient with a history of known renal cell carcinoma. Dedicated CT imaging of the chest is recommended for further characterization 6. Extensive right coronary artery calcification. 7. Aortic atherosclerosis. Aortic Atherosclerosis (ICD10-I70.0). Electronically Signed   By: Dorethia Molt M.D.   On: 03/14/2024 03:04     Cancer of left kidney (HCC) # Left kidney cancer-clear cell carcinoma [ Stage III, pT3N0 s/p adjuvant Sutent  finished in 2021]   # AUG 28th, 2025- ER- CT kidney protocol:  Interval development of multiple hyperdense masses within the nephrectomy bed and within the left periaortic region suspicious for recurrent disease; Interval development of an indeterminate 15 mm nodule within the right adrenal gland;  Interval development of a 18 mm mean diameter nodule within the left lower lobe suspicious for development of pulmonary metastatic Disease. ALSO - RIGHT KIDNEY- least 4.6 x 5.2 cm, lower pole suspicious for a primary renal neoplasm or a metastasis.   # Lymph node, biopsy, left RP : CLEAR CELL- METASTATIC RENAL CELL CARCINOMA;  Diagnosis Note : - Immunohistochemical stains CK AE1/AE3, PAX8 and CD10 are  Positive consistent with involvement by the patient's known renal cell carcinom   # IMDC- FAVORABLE- high volume-symptomatic disease-recommend starting combination of immunotherapy with tyrosine kinase inhibitor.  Recommend a combination of p.o. cabozantinib 40 mg daily along with nivolumab every 4 weeks.   # I discussed the mechanism of action; The goal of therapy is palliative; and length of treatments are likely ongoing/based upon the results of the scans. Discussed the potential side effects of immunotherapy including but not limited to diarrhea; skin rash; elevated LFTs/endocrine abnormalities etc.  VEGF/MET-TKI related (from cabozantinib):GI: diarrhea, nausea, decreased appetite, stomatitis; Dermatologic: hand-foot syndrome (palmar-plantar erythrodysesthesia); Hypertension Fatigue; Hepatic enzyme elevations;  Proteinuria; Rare but notable: fistula, perforation, hemorrhage [esp given hx of hematuria.  Discussed with pharmacy.   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  STOP ASPRIN.  Consider Venofer  if not improved.  # # History of hypothyroidism-  on synthroid  100 mcg/d-   # HTN- well controlled-   # ACP:    # DISPOSITION: # 9/25- APP- labs- cbc/cmp; random urine protein creatinine ratio; Nivolumab; venofer . # as per IS- 5 weeks-MD--  Dr.A or Y  - labs- cbc/cmp; random urine protein creatinine ratio; Nivolumab.venofer . # in 9 weeks- MD;  labs- cbc/cmp; random urine protein creatinine ratio; Nivolumab; venofer -  Dr.B.  # I reviewed the blood work- with the patient in detail; also reviewed the imaging independently [as summarized above]; and with the patient in detail.     Above plan of care was discussed with patient/family in detail.  My contact information was given to the patient/family.  Cindy JONELLE Joe, MD 04/06/2024 2:54 PM

## 2024-04-03 NOTE — Progress Notes (Signed)
 Pharmacist Chemotherapy Monitoring - Initial Assessment    Anticipated start date: 04/07/24   The following has been reviewed per standard work regarding the patient's treatment regimen: The patient's diagnosis, treatment plan and drug doses, and organ/hematologic function Lab orders and baseline tests specific to treatment regimen  The treatment plan start date, drug sequencing, and pre-medications Prior authorization status  Patient's documented medication list, including drug-drug interaction screen and prescriptions for anti-emetics and supportive care specific to the treatment regimen The drug concentrations, fluid compatibility, administration routes, and timing of the medications to be used The patient's access for treatment and lifetime cumulative dose history, if applicable  The patient's medication allergies and previous infusion related reactions, if applicable   Changes made to treatment plan:  treatment plan date  Follow up needed:  N/A   Jennfer Gassen, Pharm.D., CPP 04/03/2024@11 :43 AM

## 2024-04-03 NOTE — Progress Notes (Signed)
 Pt in for follow up, states he tried taking OTC iron and it upset his stomach and stopped taking it after a few days.  Pt also reports he has not taken his BP meds this morning.

## 2024-04-03 NOTE — Assessment & Plan Note (Addendum)
#   Left kidney cancer-clear cell carcinoma [ Stage III, pT3N0 s/p adjuvant Sutent  finished in 2021]   # AUG 28th, 2025- ER- CT kidney protocol:  Interval development of multiple hyperdense masses within the nephrectomy bed and within the left periaortic region suspicious for recurrent disease; Interval development of an indeterminate 15 mm nodule within the right adrenal gland; Interval development of a 18 mm mean diameter nodule within the left lower lobe suspicious for development of pulmonary metastatic Disease. ALSO - RIGHT KIDNEY- least 4.6 x 5.2 cm, lower pole suspicious for a primary renal neoplasm or a metastasis.   # Lymph node, biopsy, left RP : CLEAR CELL- METASTATIC RENAL CELL CARCINOMA;  Diagnosis Note : - Immunohistochemical stains CK AE1/AE3, PAX8 and CD10 are  Positive consistent with involvement by the patient's known renal cell carcinom   # IMDC- FAVORABLE- high volume-symptomatic disease-recommend starting combination of immunotherapy with tyrosine kinase inhibitor.  Recommend a combination of p.o. cabozantinib 40 mg daily along with nivolumab every 4 weeks.   # I discussed the mechanism of action; The goal of therapy is palliative; and length of treatments are likely ongoing/based upon the results of the scans. Discussed the potential side effects of immunotherapy including but not limited to diarrhea; skin rash; elevated LFTs/endocrine abnormalities etc.  VEGF/MET-TKI related (from cabozantinib):GI: diarrhea, nausea, decreased appetite, stomatitis; Dermatologic: hand-foot syndrome (palmar-plantar erythrodysesthesia); Hypertension Fatigue; Hepatic enzyme elevations;  Proteinuria; Rare but notable: fistula, perforation, hemorrhage [esp given hx of hematuria.  Discussed with pharmacy.   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  STOP ASPRIN.  Consider Venofer  if not improved.  # # History of hypothyroidism-  on  synthroid  100 mcg/d-   # HTN- well controlled-   # ACP:    # DISPOSITION: # 9/25- APP- labs- cbc/cmp; random urine protein creatinine ratio; Nivolumab; venofer . # as per IS- 5 weeks-MD--  Dr.A or Y  - labs- cbc/cmp; random urine protein creatinine ratio; Nivolumab.venofer . # in 9 weeks- MD;  labs- cbc/cmp; random urine protein creatinine ratio; Nivolumab; venofer - Dr.B.  # I reviewed the blood work- with the patient in detail; also reviewed the imaging independently [as summarized above]; and with the patient in detail.

## 2024-04-03 NOTE — Progress Notes (Signed)
START ON PATHWAY REGIMEN - Renal Cell     A cycle is every 21 days:     Axitinib      Pembrolizumab   **Always confirm dose/schedule in your pharmacy ordering system**  Patient Characteristics: Stage IV (Unresected T4M0 or Any T, M1)/Metastatic Disease, Clear Cell, First Line, Favorable Risk Therapeutic Status: Stage IV (Unresected T4M0 or Any T, M1)/Metastatic Disease Histology: Clear Cell Line of Therapy: First Line Risk Status: Favorable Risk Intent of Therapy: Non-Curative / Palliative Intent, Discussed with Patient

## 2024-04-04 ENCOUNTER — Other Ambulatory Visit: Payer: Self-pay

## 2024-04-06 ENCOUNTER — Other Ambulatory Visit: Payer: Self-pay | Admitting: Internal Medicine

## 2024-04-06 ENCOUNTER — Encounter: Payer: Self-pay | Admitting: Internal Medicine

## 2024-04-06 ENCOUNTER — Other Ambulatory Visit (HOSPITAL_COMMUNITY): Payer: Self-pay

## 2024-04-06 DIAGNOSIS — I10 Essential (primary) hypertension: Secondary | ICD-10-CM

## 2024-04-06 NOTE — Telephone Encounter (Signed)
 Oral Oncology Patient Advocate Encounter  Application has been submitted for patient to sign via DocuSign. Patient will also submit income information via MyChart message.  Provider portion of the application has been placed on MD's desk for him to sign.  Wilene Pharo (Patty) Chet Burnet, CPhT  [facility], [place], [facility] - Oral Chemotherapy Patient Advocate Specialist III Phone: [phone]  Fax: [phone]

## 2024-04-07 ENCOUNTER — Encounter: Payer: Self-pay | Admitting: Internal Medicine

## 2024-04-07 ENCOUNTER — Inpatient Hospital Stay (HOSPITAL_BASED_OUTPATIENT_CLINIC_OR_DEPARTMENT_OTHER): Payer: Self-pay | Admitting: Internal Medicine

## 2024-04-07 ENCOUNTER — Other Ambulatory Visit: Payer: Self-pay

## 2024-04-07 ENCOUNTER — Inpatient Hospital Stay: Payer: Self-pay

## 2024-04-07 VITALS — BP 177/72

## 2024-04-07 VITALS — BP 116/70 | HR 72 | Temp 97.5°F | Resp 16 | Ht 67.0 in | Wt 217.7 lb

## 2024-04-07 DIAGNOSIS — Z79890 Hormone replacement therapy (postmenopausal): Secondary | ICD-10-CM

## 2024-04-07 DIAGNOSIS — D508 Other iron deficiency anemias: Secondary | ICD-10-CM

## 2024-04-07 DIAGNOSIS — E039 Hypothyroidism, unspecified: Secondary | ICD-10-CM

## 2024-04-07 DIAGNOSIS — I10 Essential (primary) hypertension: Secondary | ICD-10-CM

## 2024-04-07 DIAGNOSIS — C642 Malignant neoplasm of left kidney, except renal pelvis: Secondary | ICD-10-CM

## 2024-04-07 DIAGNOSIS — R319 Hematuria, unspecified: Secondary | ICD-10-CM

## 2024-04-07 DIAGNOSIS — C772 Secondary and unspecified malignant neoplasm of intra-abdominal lymph nodes: Secondary | ICD-10-CM

## 2024-04-07 DIAGNOSIS — N2889 Other specified disorders of kidney and ureter: Secondary | ICD-10-CM | POA: Diagnosis not present

## 2024-04-07 DIAGNOSIS — R911 Solitary pulmonary nodule: Secondary | ICD-10-CM

## 2024-04-07 DIAGNOSIS — D649 Anemia, unspecified: Secondary | ICD-10-CM

## 2024-04-07 LAB — CBC WITH DIFFERENTIAL (CANCER CENTER ONLY)
Abs Immature Granulocytes: 0.02 K/uL (ref 0.00–0.07)
Basophils Absolute: 0.1 K/uL (ref 0.0–0.1)
Basophils Relative: 1 %
Eosinophils Absolute: 0.5 K/uL (ref 0.0–0.5)
Eosinophils Relative: 6 %
HCT: 30.6 % — ABNORMAL LOW (ref 39.0–52.0)
Hemoglobin: 9.5 g/dL — ABNORMAL LOW (ref 13.0–17.0)
Immature Granulocytes: 0 %
Lymphocytes Relative: 18 %
Lymphs Abs: 1.4 K/uL (ref 0.7–4.0)
MCH: 23.3 pg — ABNORMAL LOW (ref 26.0–34.0)
MCHC: 31 g/dL (ref 30.0–36.0)
MCV: 75.2 fL — ABNORMAL LOW (ref 80.0–100.0)
Monocytes Absolute: 0.6 K/uL (ref 0.1–1.0)
Monocytes Relative: 7 %
Neutro Abs: 5.6 K/uL (ref 1.7–7.7)
Neutrophils Relative %: 68 %
Platelet Count: 275 K/uL (ref 150–400)
RBC: 4.07 MIL/uL — ABNORMAL LOW (ref 4.22–5.81)
RDW: 15.7 % — ABNORMAL HIGH (ref 11.5–15.5)
WBC Count: 8.2 K/uL (ref 4.0–10.5)
nRBC: 0 % (ref 0.0–0.2)

## 2024-04-07 LAB — CMP (CANCER CENTER ONLY)
ALT: 13 U/L (ref 0–44)
AST: 20 U/L (ref 15–41)
Albumin: 3.9 g/dL (ref 3.5–5.0)
Alkaline Phosphatase: 72 U/L (ref 38–126)
Anion gap: 13 (ref 5–15)
BUN: 26 mg/dL — ABNORMAL HIGH (ref 6–20)
CO2: 22 mmol/L (ref 22–32)
Calcium: 9.4 mg/dL (ref 8.9–10.3)
Chloride: 101 mmol/L (ref 98–111)
Creatinine: 1.46 mg/dL — ABNORMAL HIGH (ref 0.61–1.24)
GFR, Estimated: 57 mL/min — ABNORMAL LOW (ref >60)
Glucose, Bld: 157 mg/dL — ABNORMAL HIGH (ref 70–99)
Potassium: 3.5 mmol/L (ref 3.5–5.1)
Sodium: 136 mmol/L (ref 135–145)
Total Bilirubin: 0.9 mg/dL (ref 0.0–1.2)
Total Protein: 7.9 g/dL (ref 6.5–8.1)

## 2024-04-07 LAB — IRON AND TIBC
Iron: 32 ug/dL — ABNORMAL LOW (ref 45–182)
Saturation Ratios: 8 % — ABNORMAL LOW (ref 17.9–39.5)
TIBC: 402 ug/dL (ref 250–450)
UIBC: 370 ug/dL

## 2024-04-07 LAB — LACTATE DEHYDROGENASE: LDH: 103 U/L (ref 98–192)

## 2024-04-07 LAB — PROTEIN / CREATININE RATIO, URINE
Creatinine, Urine: 45 mg/dL
Protein Creatinine Ratio: 0.93 mg/mg{creat} — ABNORMAL HIGH (ref 0.00–0.15)
Total Protein, Urine: 42 mg/dL

## 2024-04-07 LAB — FERRITIN: Ferritin: 22 ng/mL — ABNORMAL LOW (ref 24–336)

## 2024-04-07 LAB — TSH: TSH: 4.339 u[IU]/mL (ref 0.350–4.500)

## 2024-04-07 MED ORDER — SODIUM CHLORIDE 0.9 % IV SOLN: 480.0000 mg | Freq: Once | INTRAVENOUS | Status: DC

## 2024-04-07 MED ORDER — SODIUM CHLORIDE 0.9 % IV SOLN
INTRAVENOUS | Status: DC
  Filled 2024-04-07: qty 250

## 2024-04-07 MED ORDER — OLMESARTAN MEDOXOMIL 40 MG PO TABS
40.0000 mg | ORAL_TABLET | Freq: Every day | ORAL | 0 refills | Status: DC
  Filled 2024-04-07: qty 90, 90d supply, fill #0

## 2024-04-07 MED ORDER — METFORMIN HCL 1000 MG PO TABS
1000.0000 mg | ORAL_TABLET | Freq: Two times a day (BID) | ORAL | 0 refills | Status: DC
  Filled 2024-04-07: qty 180, 90d supply, fill #0

## 2024-04-07 MED ORDER — IRON SUCROSE 20 MG/ML IV SOLN
200.0000 mg | Freq: Once | INTRAVENOUS | Status: AC
  Administered 2024-04-07: 200 mg via INTRAVENOUS
  Filled 2024-04-07: qty 10

## 2024-04-07 NOTE — Telephone Encounter (Signed)
 Oral Oncology Patient Advocate Encounter   Submitted application for assistance for Cabometyx to [employer].   Application submitted via e-fax to [phone]   [employer] phone number [phone].   I will continue to check the status until final determination.   Ezequias Lard (Patty) Chet Burnet, CPhT  [facility], [place], [facility] - Oral Chemotherapy Patient Advocate Specialist III Phone: [phone]  Fax: [phone]

## 2024-04-07 NOTE — Progress Notes (Signed)
 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 Sniskinski]; July CT 2020- mid anterior left kidney measuring 8.6 x 8.1 x 9.4 cm; CT chest negative. #April 06, 2019-ADJUVANT Sutent  50 mg 2 weeks on 1 week off.    # SEP 23rd, 2025-  STAGE IV RECURRENCE- s/p Bx- CABO- + NIVO     # DM- 2- OHA/ HTN  DIAGNOSIS: Left kidney cancer  STAGE: 3       ;GOALS: Cure  CURRENT/MOST RECENT THERAPY: Sutent     History of kidney cancer  02/26/2019 Initial Diagnosis   Cancer of left kidney (HCC)   Cancer of left kidney (HCC)  12/16/2020 Initial Diagnosis   Cancer of left kidney (HCC)   04/16/2024 -  Chemotherapy   Patient is on Treatment Plan : RENAL CELL Nivolumab (480) q28d       HISTORY OF PRESENTING ILLNESS: Patient ambulating- independently.   Alone/Accompanied by family.   Layson Bertsch Moga 53 y.o.  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  Patient denies any worsening hematuria.  Complains of ongoing fatigue.  Stopped taking iron  pills because of constipation.  His appetite is good.  No significant weight loss.  Denies any nausea or vomiting.  Any worsening pain.  No headaches.   Review of Systems  Constitutional:  Positive for malaise/fatigue. Negative for chills, diaphoresis, fever and weight loss.  HENT:  Negative for nosebleeds and sore throat.   Eyes:  Negative for double vision.  Respiratory:  Negative for cough, hemoptysis, sputum production, shortness  of breath and wheezing.   Cardiovascular:  Negative for chest pain, palpitations, orthopnea and leg swelling.  Gastrointestinal:  Negative for abdominal pain, blood in stool, constipation, diarrhea, heartburn, melena, nausea and vomiting.  Genitourinary:  Negative for dysuria, frequency and urgency.  Musculoskeletal:  Positive for back pain. Negative for joint pain.  Skin: Negative.  Negative for itching and rash.  Neurological:  Negative for dizziness, tingling, focal weakness, weakness and headaches.  Endo/Heme/Allergies:  Does not bruise/bleed easily.  Psychiatric/Behavioral:  Negative for depression. The patient is not nervous/anxious and does not have insomnia.     MEDICAL HISTORY:  Past Medical History:  Diagnosis Date   Allergy    Diabetes mellitus without complication (HCC)    Hypertension    Renal cancer, left (HCC) 01/2019   Left Nephrectomy    SURGICAL HISTORY: Past Surgical History:  Procedure Laterality Date   CYSTOSCOPY  02/06/2019   Procedure: CYSTOSCOPY;  Surgeon: Francisca Redell BROCKS, MD;  Location: [facility] ORS;  Service: Urology;;   LAPAROSCOPIC NEPHRECTOMY, HAND ASSISTED Left 02/06/2019   Procedure: HAND ASSISTED LAPAROSCOPIC LEFT RADICAL NEPHRECTOMY;  Surgeon: Francisca Redell BROCKS, MD;  Location: [facility] ORS;  Service: Urology;  Laterality: Left;   NO PAST SURGERIES      SOCIAL HISTORY: Social History   Socioeconomic History   Marital status: Married    Spouse name: Not on file   Number of children: Not on file   Years of education: Not on file   Highest education level: GED or equivalent  Occupational History   Not on file  Tobacco Use   Smoking status: Never   Smokeless tobacco: Never  Vaping Use   Vaping status: Never Used  Substance and Sexual Activity   Alcohol use: No   Drug use: No   Sexual activity: Yes    Birth control/protection: Other-see comments    Comment: mutually monogamous relationship partner postmenopausal  Other Topics Concern   Not on  file  Social History Narrative   In [place]/ with wife; son [downs syndrome]; no smoking/ no alcohol; works for Engineer, water.    Social Drivers of Corporate investment banker Strain: High Risk (03/05/2024)   Overall Financial Resource Strain (CARDIA)    Difficulty of Paying Living Expenses: Hard  Food Insecurity: No Food Insecurity (03/20/2024)   Hunger Vital Sign    Worried About Running Out of Food in the Last Year: Never true    Ran Out of Food in the Last Year: Never true  Recent Concern: Food Insecurity - Food Insecurity Present (03/05/2024)   Hunger Vital Sign    Worried About Running Out of Food in the Last Year: Sometimes true    Ran Out of Food in the Last Year: Sometimes true  Transportation Needs: No Transportation Needs (03/20/2024)   PRAPARE - Administrator, Civil Service (Medical): No    Lack of Transportation (Non-Medical): No  Physical Activity: Insufficiently Active (03/05/2024)   Exercise Vital Sign    Days of Exercise per Week: 5 days    Minutes of Exercise per Session: 20 min  Stress: No Stress Concern Present (03/05/2024)   [employer] of Occupational Health - Occupational Stress Questionnaire    Feeling of Stress: Only a Braley  Social Connections: Unknown (03/05/2024)   Social Connection and Isolation Panel    Frequency of Communication with Friends and Family: Once a week    Frequency of Social Gatherings with Friends and Family: Patient declined    Attends Religious Services: Never    Database administrator or Organizations: No    Attends Engineer, structural: Not on file    Marital Status: Married  Catering manager Violence: Not At Risk (03/20/2024)   Humiliation, Afraid, Rape, and Kick questionnaire    Fear of Current or Ex-Partner: No    Emotionally Abused: No    Physically Abused: No    Sexually Abused: No    FAMILY HISTORY: Family History  Problem Relation Age of Onset   Diabetes Mother    Heart disease Mother     Mental illness Mother    Thyroid  disease Mother    Heart disease Father    Diabetes Father    Mental illness Father     ALLERGIES:  has no known allergies.  MEDICATIONS:  Current Outpatient Medications  Medication Sig Dispense Refill   acetaminophen  (TYLENOL ) 325 MG tablet Take 650 mg by mouth every 6 (six) hours as needed for headache.     amLODipine  (NORVASC ) 10 MG tablet Take 1 tablet (10 mg total) by mouth daily. 90 tablet 0   blood glucose meter kit and supplies Dispense based on patient and insurance preference. Use to check blood sugar daily as directed. DX: E11.9. 1 each 0   carvedilol  (COREG ) 25 MG tablet Take 1 tablet (25 mg total) by mouth 2 (two) times daily with a meal. 180 tablet 1   fluticasone  (FLONASE ) 50 MCG/ACT nasal spray Place 2 sprays into both nostrils daily. 48 g 0   levothyroxine  (  SYNTHROID ) 100 MCG tablet Take 1 tablet (100 mcg total) by mouth daily. 90 tablet 1   loratadine (CLARITIN) 10 MG tablet Take 10 mg by mouth daily.     montelukast  (SINGULAIR ) 10 MG tablet Take 1 tablet (10 mg total) by mouth once daily. 90 tablet 1   rosuvastatin  (CRESTOR ) 40 MG tablet Take 1 tablet (40 mg total) by mouth daily. 90 tablet 3   metFORMIN  (GLUCOPHAGE ) 1000 MG tablet Take 1 tablet (1,000 mg total) by mouth 2 (two) times daily with a meal. 180 tablet 0   olmesartan  (BENICAR ) 40 MG tablet Take 1 tablet (40 mg total) by mouth daily. 90 tablet 0   No current facility-administered medications for this visit.    PHYSICAL EXAMINATION:   Vitals:   04/07/24 0944  BP: 116/70  Pulse: 72  Resp: 16  Temp: (!) 97.5 F (36.4 C)  SpO2: 99%   Filed Weights   04/07/24 0944  Weight: 217 lb 11.2 oz (98.7 kg)    Physical Exam Vitals and nursing note reviewed.  HENT:     Head: Normocephalic and atraumatic.     Mouth/Throat:     Pharynx: Oropharynx is clear.  Eyes:     Extraocular Movements: Extraocular movements intact.     Pupils: Pupils are equal, round, and reactive  to light.  Cardiovascular:     Rate and Rhythm: Normal rate and regular rhythm.  Pulmonary:     Comments: Decreased breath sounds bilaterally.  Abdominal:     Palpations: Abdomen is soft.  Musculoskeletal:        General: Normal range of motion.     Cervical back: Normal range of motion.  Skin:    General: Skin is warm.  Neurological:     General: No focal deficit present.     Mental Status: He is alert and oriented to person, place, and time.  Psychiatric:        Behavior: Behavior normal.        Judgment: Judgment normal.     LABORATORY DATA:  I have reviewed the data as listed Lab Results  Component Value Date   WBC 8.2 04/07/2024   HGB 9.5 (L) 04/07/2024   HCT 30.6 (L) 04/07/2024   MCV 75.2 (L) 04/07/2024   PLT 275 04/07/2024   Recent Labs    09/02/23 0856 03/06/24 1556 03/14/24 0002 04/07/24 0949  NA 139 137 137 136  K 3.2* 3.4* 3.6 3.5  CL 101 100 102 101  CO2 27 24 23 22   GLUCOSE 110* 130* 102* 157*  BUN 21* 19 23* 26*  CREATININE 1.26* 1.51* 1.68* 1.46*  CALCIUM  9.7 9.3 9.5 9.4  GFRNONAA >60 55* 48* 57*  PROT 8.2* 7.6  --  7.9  ALBUMIN  4.1 3.6  --  3.9  AST 29 24  --  20  ALT 26 15  --  13  ALKPHOS 54 61  --  72  BILITOT 0.7 0.4  --  0.9    RADIOGRAPHIC STUDIES: I have personally reviewed the radiological images as listed and agreed with the findings in the report. CT ABDOMINAL MASS BIOPSY Result Date: 03/30/2024 INDICATION: No cell carcinoma, left retroperitoneal metastatic adenopathy EXAM: CT BIOPSY LEFT RETROPERITONEAL ADENOPATHY MEDICATIONS: 1% LIDOCAINE  LOCAL ANESTHESIA/SEDATION: Moderate (conscious) sedation was employed during this procedure. A total of Versed  2.0 mg and Fentanyl  100 mcg was administered intravenously by the radiology nurse. Total intra-service moderate Sedation Time: 12 minutes. The patient's level of consciousness and vital signs were monitored  continuously by radiology nursing throughout the procedure under my direct  supervision. COMPLICATIONS: None immediate. PROCEDURE: Informed written consent was obtained from the patient after a thorough discussion of the procedural risks, benefits and alternatives. All questions were addressed. Maximal Sterile Barrier Technique was utilized including caps, mask, sterile gowns, sterile gloves, sterile drape, hand hygiene and skin antiseptic. A timeout was performed prior to the initiation of the procedure. Previous imaging reviewed. Patient positioned prone. Noncontrast localization CT performed. The enlarged left retroperitoneal adenopathy was localized and marked for biopsy. Under sterile conditions and local anesthesia, the 17 gauge 11.8 cm guide needle was advanced from a posterior left oblique approach to the adenopathy. Needle position confirmed on the edge of the adenopathy by CT. 18 gauge core biopsies obtained. These were placed in formalin. Samples were somewhat fragmented. Postprocedure imaging demonstrates a small amount of Peri lesion retroperitoneal hemorrhage. No large hematoma. Patient tolerated biopsy well. IMPRESSION: Successful CT-guided left retroperitoneal adenopathy 18 gauge core biopsy Electronically Signed   By: CHRISTELLA.  Shick M.D.   On: 03/30/2024 10:21   CT Renal Stone Study Result Date: 03/14/2024 CLINICAL DATA:  Hematuria, flank pain EXAM: CT ABDOMEN AND PELVIS WITHOUT CONTRAST TECHNIQUE: Multidetector CT imaging of the abdomen and pelvis was performed following the standard protocol without IV contrast. RADIATION DOSE REDUCTION: This exam was performed according to the departmental dose-optimization program which includes automated exposure control, adjustment of the mA and/or kV according to patient size and/or use of iterative reconstruction technique. COMPARISON:  09/12/2020 FINDINGS: Lower chest: Mean 18 mm within the left lower lobe suspicious for development of pulmonary metastatic disease in this patient with a history of known renal cell carcinoma. No  pleural effusion. Extensive right coronary artery calcification. Global cardiac size within normal limits. Hepatobiliary: Layering subtle hyperdensity within the gallbladder lumen may reflect layering sludge. No superimposed pericholecystic inflammatory change. Liver unremarkable on this noncontrast examination. No intra or extrahepatic biliary ductal dilation. Pancreas: Unremarkable Spleen: Unremarkable Adrenals/Urinary Tract: Interval development of an indeterminate 15 mm nodule within the right adrenal gland. Left adrenal gland is unremarkable. Status post left nephrectomy. Interval development of multiple hyperdense masses within the nephrectomy bed measuring up to 17 x 27 mm suspicious for recurrent disease. There is compensatory hypertrophy of the right kidney. There is development of a lobulated mildly hyperdense cortical mass within the lower pole the right kidney measuring at least 4.6 x 5.2 cm, not well characterized on this noncontrast examination. There is hyperdense material seen within the nondilated right renal pelvis likely representing blood product. Intrapelvic extension of the lobulated mass within the lower pole the right kidney is not excluded. No hydronephrosis. Mild right perinephric stranding, nonspecific. The bladder is decompressed and is unremarkable. Stomach/Bowel: Stomach is within normal limits. Appendix is not clearly identified, however, no secondary signs of appendicitis within the right lower quadrant. No evidence of bowel wall thickening, distention, or inflammatory changes. Vascular/Lymphatic: 23 mm rounded mass is seen within the left periaortic region (42/2) in keeping with pathologic adenopathy or a retroperitoneal metastasis given the history of a renal cell carcinoma. No additional pathologic adenopathy identified. Moderate aortoiliac atherosclerotic calcification. Reproductive: Prostate is unremarkable. Other: No abdominal wall hernia or abnormality. No abdominopelvic  ascites. Musculoskeletal: No acute bone abnormality. No lytic or blastic bone lesion. Osseous structures are age appropriate. IMPRESSION: 1. Status post left nephrectomy. Interval development of multiple hyperdense masses within the nephrectomy bed and within the left periaortic region suspicious for recurrent disease. 2. Interval development of a lobulated  mildly hyperdense cortical mass within the lower pole the right kidney measuring at least 4.6 x 5.2 cm, not well characterized on this noncontrast examination. Intrapelvic extension of the lobulated mass within the lower pole the right kidney is not excluded. This is suspicious for a primary renal neoplasm or a metastasis. Contrast enhanced MRI examination is recommended for further evaluation. This can simultaneously assessed the above mention masses as well as the new right adrenal nodule. 3. Hyperdense material within the nondilated right renal pelvis likely representing blood product. 4. Interval development of an indeterminate 15 mm nodule within the right adrenal gland. 5. Interval development of a 18 mm mean diameter nodule within the left lower lobe suspicious for development of pulmonary metastatic disease in this patient with a history of known renal cell carcinoma. Dedicated CT imaging of the chest is recommended for further characterization 6. Extensive right coronary artery calcification. 7. Aortic atherosclerosis. Aortic Atherosclerosis (ICD10-I70.0). Electronically Signed   By: Dorethia Molt M.D.   On: 03/14/2024 03:04     Cancer of left kidney (HCC) # Hx Left kidney cancer-clear cell carcinoma [ 2021]; # AUG 28th, 2025- ER- CT kidney protocol:  Interval development of multiple hyperdense masses within the nephrectomy bed and within the left periaortic region suspicious for recurrent disease; Interval development of an indeterminate 15 mm nodule within the right adrenal gland; Interval development of a 18 mm mean diameter nodule within the  left lower lobe suspicious for development of pulmonary metastatic Disease. ALSO - RIGHT KIDNEY- least 4.6 x 5.2 cm, lower pole suspicious for a primary renal neoplasm or a metastasis.   # Lymph node, biopsy, left RP : CLEAR CELL- METASTATIC RENAL CELL CARCINOMA;  Diagnosis Note : - Immunohistochemical stains CK AE1/AE3, PAX8 and CD10 are  Positive consistent with involvement by the patient's known renal cell carcinoma # IMDC- FAVORABLE- high volume-symptomatic disease- PROCEED WITH p.o. cabozantinib 40 mg daily along with nivolumab every 4 weeks. POOR DENTITION-/ no insurance HOLD ZOMETA   # PROCEED WITH NIVO cycle #1- Labs-CBC/chemistries were reviewed with the patient. START CABO- patient is uninsured we had to proceed with applying for the manufacturer's patient assistance program. It was just sent out this morning since we were waiting on documents from him. Estimated time would be about a week and a half to two weeks   # I discussed the mechanism of action; The goal of therapy is palliative; and length of treatments are likely ongoing/based upon the results of the scans. Discussed the potential side effects of immunotherapy including but not limited to diarrhea; skin rash; elevated LFTs/endocrine abnormalities etc.  VEGF/MET-TKI related (from cabozantinib):GI: diarrhea, nausea, decreased appetite, stomatitis; Dermatologic: hand-foot syndrome (palmar-plantar erythrodysesthesia); Hypertension Fatigue; Hepatic enzyme elevations;  Proteinuria; Rare but notable: fistula, perforation, hemorrhage [esp given hx of hematuria.  Discussed with pharmacy.   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 Plan  Venofer  weekly-   # # History of hypothyroidism-  on synthroid  100 mcg/d-   # HTN- well controlled-   # ACP: done-   PS # DISPOSITION: # NIVO- venofer  today-  # weekly venofer - x2 more # 4 weeks-MD--  Dr.A or Y  - labs- cbc/cmp; random urine protein  creatinine ratio; Nivolumab.venofer . # in 8  weeks- MD;  labs- cbc/cmp; random urine protein creatinine ratio; Nivolumab; venofer - Dr.B.  Addendum: Patient does not have insurance-assistance program still in process.  Will have to reschedule nivolumab to next oct 2nd- no  provider visit- GB      Cindy JONELLE Joe, MD 04/07/2024 4:04 PM

## 2024-04-07 NOTE — Progress Notes (Signed)
 No concerns today. New start.

## 2024-04-07 NOTE — Telephone Encounter (Signed)
 Requested Prescriptions  Pending Prescriptions Disp Refills   metFORMIN  (GLUCOPHAGE ) 1000 MG tablet 180 tablet 0    Sig: Take 1 tablet (1,000 mg total) by mouth 2 (two) times daily with a meal.     Endocrinology:  Diabetes - Biguanides Failed - 04/07/2024  3:38 PM      Failed - Cr in normal range and within 360 days    Creatinine  Date Value Ref Range Status  04/07/2024 1.46 (H) 0.61 - 1.24 mg/dL Final   Creat  Date Value Ref Range Status  03/13/2018 0.91 0.60 - 1.35 mg/dL Final   Creatinine, Urine  Date Value Ref Range Status  04/07/2024 45 mg/dL Final         Failed - eGFR in normal range and within 360 days    GFR, Est African American  Date Value Ref Range Status  03/13/2018 116 > OR = 60 mL/min/1.19m2 Final   GFR calc Af Amer  Date Value Ref Range Status  04/15/2020 >60 >60 mL/min Final   GFR, Est Non African American  Date Value Ref Range Status  03/13/2018 100 > OR = 60 mL/min/1.44m2 Final   GFR, Estimated  Date Value Ref Range Status  04/07/2024 57 (L) >60 mL/min Final    Comment:    (NOTE) Calculated using the CKD-EPI Creatinine Equation (2021)          Failed - B12 Level in normal range and within 720 days    No results found for: VITAMINB12       Failed - CBC within normal limits and completed in the last 12 months    WBC  Date Value Ref Range Status  03/30/2024 9.1 4.0 - 10.5 K/uL Final   WBC Count  Date Value Ref Range Status  04/07/2024 8.2 4.0 - 10.5 K/uL Final   RBC  Date Value Ref Range Status  04/07/2024 4.07 (L) 4.22 - 5.81 MIL/uL Final   Hemoglobin  Date Value Ref Range Status  04/07/2024 9.5 (L) 13.0 - 17.0 g/dL Final    Comment:    Reticulocyte Hemoglobin testing may be clinically indicated, consider ordering this additional test [id number]    HGB  Date Value Ref Range Status  09/22/2012 12.5 (L) 13.0 - 18.0 g/dL Final   HCT  Date Value Ref Range Status  04/07/2024 30.6 (L) 39.0 - 52.0 % Final  09/22/2012 38.1 (L) 40.0 -  52.0 % Final   MCHC  Date Value Ref Range Status  04/07/2024 31.0 30.0 - 36.0 g/dL Final   [facility]  Date Value Ref Range Status  04/07/2024 23.3 (L) 26.0 - 34.0 pg Final   MCV  Date Value Ref Range Status  04/07/2024 75.2 (L) 80.0 - 100.0 fL Final  09/22/2012 81 80 - 100 fL Final   No results found for: PLTCOUNTKUC, LABPLAT, POCPLA RDW  Date Value Ref Range Status  04/07/2024 15.7 (H) 11.5 - 15.5 % Final  09/22/2012 15.0 (H) 11.5 - 14.5 % Final         Passed - HBA1C is between 0 and 7.9 and within 180 days    Hgb A1c MFr Bld  Date Value Ref Range Status  03/06/2024 6.3 (H) 4.8 - 5.6 % Final    Comment:    (NOTE) Diagnosis of Diabetes The following HbA1c ranges recommended by the American Diabetes Association (ADA) may be used as an aid in the diagnosis of diabetes mellitus.  Hemoglobin  Suggested A1C NGSP%              Diagnosis  <5.7                   Non Diabetic  5.7-6.4                Pre-Diabetic  >6.4                   Diabetic  <7.0                   Glycemic control for                       adults with diabetes.           Passed - Valid encounter within last 6 months    Recent Outpatient Visits           1 month ago Encounter for general adult medical examination with abnormal findings   [place] [facility] [place], [place], NP   7 months ago Type 2 diabetes mellitus with hyperglycemia, without long-term current use of insulin  [facility])   [place] [facility] [place], [place], NP               olmesartan  (BENICAR ) 40 MG tablet 90 tablet 0    Sig: Take 1 tablet (40 mg total) by mouth daily.     Cardiovascular:  Angiotensin Receptor Blockers Failed - 04/07/2024  3:38 PM      Failed - Cr in normal range and within 180 days    Creatinine  Date Value Ref Range Status  04/07/2024 1.46 (H) 0.61 - 1.24 mg/dL Final   Creat  Date Value Ref Range Status  03/13/2018 0.91 0.60 - 1.35 mg/dL Final    Creatinine, Urine  Date Value Ref Range Status  04/07/2024 45 mg/dL Final         Failed - Last BP in normal range    BP Readings from Last 1 Encounters:  04/07/24 (!) 177/72         Passed - K in normal range and within 180 days    Potassium  Date Value Ref Range Status  04/07/2024 3.5 3.5 - 5.1 mmol/L Final  09/22/2012 3.9 3.5 - 5.1 mmol/L Final         Passed - Patient is not pregnant      Passed - Valid encounter within last 6 months    Recent Outpatient Visits           1 month ago Encounter for general adult medical examination with abnormal findings   [place] [facility] [place], Angeline ORN, NP   7 months ago Type 2 diabetes mellitus with hyperglycemia, without long-term current use of insulin  [facility])    [facility] [place], Angeline ORN, [place]

## 2024-04-07 NOTE — Patient Instructions (Signed)

## 2024-04-07 NOTE — Assessment & Plan Note (Addendum)
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 ALSO - RIGHT KIDNEY- least 4.6 x 5.2 cm, lower pole suspicious for a primary renal neoplasm or a metastasis.   # Lymph node, biopsy, left RP : CLEAR CELL- METASTATIC RENAL CELL CARCINOMA;  Diagnosis Note : - Immunohistochemical stains CK AE1/AE3, PAX8 and CD10 are  Positive consistent with involvement by the patient's known renal cell carcinoma # IMDC- FAVORABLE- high volume-symptomatic disease- PROCEED WITH p.o. cabozantinib 40 mg daily along with nivolumab every 4 weeks. POOR DENTITION-/ no insurance HOLD ZOMETA   # PROCEED WITH NIVO cycle #1- Labs-CBC/chemistries were reviewed with the patient. START CABO- patient is uninsured we had to proceed with applying for the manufacturer's patient assistance program. It was just sent out this morning since we were waiting on documents from him. Estimated time would be about a week and a half to two weeks   # I discussed the mechanism of action; The goal of therapy is palliative; and length of treatments are likely ongoing/based upon the results of the scans. Discussed the potential side effects of immunotherapy including but not limited to diarrhea; skin rash; elevated LFTs/endocrine abnormalities etc.  VEGF/MET-TKI related (from cabozantinib):GI: diarrhea, nausea, decreased appetite, stomatitis; Dermatologic: hand-foot syndrome (palmar-plantar erythrodysesthesia); Hypertension Fatigue; Hepatic enzyme elevations;  Proteinuria; Rare but notable: fistula, perforation, hemorrhage [esp given hx of  hematuria.  Discussed with pharmacy.   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 Plan  Venofer  weekly-   # # History of hypothyroidism-  on synthroid  100 mcg/d-   # HTN- well controlled-   # ACP: done-   PS # DISPOSITION: # NIVO- venofer  today-  # weekly venofer - x2 more # 4 weeks-MD--  Dr.A or Y  - labs- cbc/cmp; random urine protein creatinine ratio; Nivolumab.venofer . # in 8  weeks- MD;  labs- cbc/cmp; random urine protein creatinine ratio; Nivolumab; venofer - Dr.B.  Addendum: Patient does not have insurance-assistance program still in process.  Will have to reschedule nivolumab to next oct 2nd- no provider visit- GB

## 2024-04-08 ENCOUNTER — Encounter: Payer: Self-pay | Admitting: Internal Medicine

## 2024-04-08 LAB — T4: T4, Total: 7.8 ug/dL (ref 4.5–12.0)

## 2024-04-09 ENCOUNTER — Encounter: Payer: Self-pay | Admitting: Internal Medicine

## 2024-04-10 NOTE — Progress Notes (Signed)
 Pharmacist Chemotherapy Monitoring - Initial Assessment    Anticipated start date: 04/16/24   The following has been reviewed per standard work regarding the patient's treatment regimen: The patient's diagnosis, treatment plan and drug doses, and organ/hematologic function Lab orders and baseline tests specific to treatment regimen  The treatment plan start date, drug sequencing, and pre-medications Prior authorization status  Patient's documented medication list, including drug-drug interaction screen and prescriptions for anti-emetics and supportive care specific to the treatment regimen The drug concentrations, fluid compatibility, administration routes, and timing of the medications to be used The patient's access for treatment and lifetime cumulative dose history, if applicable  The patient's medication allergies and previous infusion related reactions, if applicable   Left kidney cancer-clear cell carcinoma  p.o. cabozantinib 40 mg daily along with nivolumab every 4 weeks. POOR DENTITION-/ no insurance HOLD ZOMETA   Follow up needed:  N/A   Redell JINNY Gaskins, RPH, 04/10/2024  2:14 PM

## 2024-04-10 NOTE — Telephone Encounter (Signed)
 Oral Oncology Patient Advocate Encounter  I called the patient assistance program to check on the status of the application and the representative stated that they are waiting on income documents to be submitted.   I have submitted the income document via e-fax to [phone].  The patient assistance program representative stated that they have shipped out a 30 days supply of the medication to the patient yesterday, 04/09/2024, and it should arrive to the patient's home some time next week.  [employer] phone number [phone].   I will continue to check the status until final determination.  Charles Mathis (Patty) Chet Burnet, CPhT  [facility], [place], [facility] - Oral Chemotherapy Patient Advocate Specialist III Phone: [phone]  Fax: [phone]

## 2024-04-13 ENCOUNTER — Encounter: Payer: Self-pay | Admitting: Internal Medicine

## 2024-04-13 ENCOUNTER — Other Ambulatory Visit: Payer: Self-pay

## 2024-04-13 MED ORDER — PROCHLORPERAZINE MALEATE 10 MG PO TABS
10.0000 mg | ORAL_TABLET | Freq: Four times a day (QID) | ORAL | 2 refills | Status: AC | PRN
  Filled 2024-04-13: qty 30, 8d supply, fill #0

## 2024-04-13 MED ORDER — ONDANSETRON HCL 8 MG PO TABS
8.0000 mg | ORAL_TABLET | Freq: Three times a day (TID) | ORAL | 2 refills | Status: AC | PRN
  Filled 2024-04-13: qty 20, 7d supply, fill #0

## 2024-04-13 NOTE — Telephone Encounter (Signed)
 Clinical Pharmacist Practitioner Encounter   Patient was delivered his 30 day free trial on 04/10/24. He has not yet started as he is trying to figure out where it fit it in his schedule. He knows he is good to get started.   Patient Education I spoke with patient for overview of new oral chemotherapy medication: Cabometyx (cabozantinib) for the treatment of recurrent metastatic clear cell RCC , planned duration until disease progression or unacceptable drug toxicity.   Treatment goal: Palliative  Counseled patient on administration, dosing, side effects, monitoring, drug-food interactions, safe handling, storage, and disposal. Patient will take 1 tablet (40 mg) by mouth daily. Take on an empty stomach, 1 hour before or 2 hours after meals.  Side effects include but not limited to: diarrhea, nausea, fatigue, mouth sores, hypertension, decreased wbc/hgb/plt.    Diarrhea: discussed with patient that depending on which medication is causing the diarrhea, that determines the best management for the diarrhea. They should call the office if diarrhea occurs, they can start loperamide but still needs to call the office. Hand-foot syndrome: patient has Udderly Smooth Extra Care 20, recommended they keep hands and feet moisturized, they know to report any skin changes HTN: reviewed s/sx of HTN, patient has BP cuff at home, suggested patient track his blood pressure at home  Mouth sores: pt instructed to call the office to obtain magic mouthwash if needed N/V: pt reported having antiemetic medication at home to use as needed.   Reviewed with patient importance of keeping a medication schedule and plan for any missed doses.  After discussion with patient no patient barriers to medication adherence identified.   Distress evaluation: patient will be assessed during oral chemo class on 04/14/24  Communication and Learning Assessment Primary learner: patient Barriers to learning: No barriers Preferred  language: English Learning preferences: Listening Reading   Mr. Heberle voiced understanding and appreciation. All questions answered. Medication handout provided.  Provided patient with Oral Chemotherapy Navigation Clinic phone number. Patient knows to call the office with questions or concerns. Oral Chemotherapy Navigation Clinic will continue to follow.  Marijane Trower N. Chaniyah Jahr, PharmD, BCOP, CPP Hematology/Oncology Clinical Pharmacist [facility] Oral Chemotherapy Navigation Clinic [phone]  04/13/2024 3:43 PM

## 2024-04-14 ENCOUNTER — Inpatient Hospital Stay: Payer: Self-pay

## 2024-04-14 ENCOUNTER — Encounter: Payer: Self-pay | Admitting: Internal Medicine

## 2024-04-14 VITALS — BP 131/71 | HR 68 | Temp 96.0°F | Resp 17

## 2024-04-14 DIAGNOSIS — D649 Anemia, unspecified: Secondary | ICD-10-CM

## 2024-04-14 DIAGNOSIS — N2889 Other specified disorders of kidney and ureter: Secondary | ICD-10-CM | POA: Diagnosis not present

## 2024-04-14 MED ORDER — SODIUM CHLORIDE 0.9% FLUSH
10.0000 mL | Freq: Once | INTRAVENOUS | Status: AC | PRN
  Administered 2024-04-14: 10 mL
  Filled 2024-04-14: qty 10

## 2024-04-14 MED ORDER — IRON SUCROSE 20 MG/ML IV SOLN
200.0000 mg | Freq: Once | INTRAVENOUS | Status: AC
  Administered 2024-04-14: 200 mg via INTRAVENOUS
  Filled 2024-04-14: qty 10

## 2024-04-14 NOTE — Telephone Encounter (Signed)
 Oral Oncology Patient Advocate Encounter   Received notification that the application for assistance for Cabometyx through [employer] has been approved.   [employer] phone number [phone].   Effective dates: 04/14/2024 through 04/14/2025  Medication will be filled at [facility].  I lvm for patient to call me to receive further details.  Miroslav Gin (Patty) Chet Burnet, CPhT  [facility], [place], [facility] - Oral Chemotherapy Patient Advocate Specialist III Phone: [phone]  Fax: [phone]

## 2024-04-16 ENCOUNTER — Inpatient Hospital Stay: Payer: Self-pay | Attending: Internal Medicine

## 2024-04-16 ENCOUNTER — Inpatient Hospital Stay: Payer: Self-pay

## 2024-04-16 VITALS — BP 130/74 | HR 65 | Temp 98.6°F | Resp 17

## 2024-04-16 DIAGNOSIS — C642 Malignant neoplasm of left kidney, except renal pelvis: Secondary | ICD-10-CM

## 2024-04-16 DIAGNOSIS — C772 Secondary and unspecified malignant neoplasm of intra-abdominal lymph nodes: Secondary | ICD-10-CM | POA: Diagnosis not present

## 2024-04-16 DIAGNOSIS — Z79620 Long term (current) use of immunosuppressive biologic: Secondary | ICD-10-CM | POA: Diagnosis not present

## 2024-04-16 DIAGNOSIS — D508 Other iron deficiency anemias: Secondary | ICD-10-CM | POA: Insufficient documentation

## 2024-04-16 DIAGNOSIS — Z5112 Encounter for antineoplastic immunotherapy: Secondary | ICD-10-CM | POA: Diagnosis present

## 2024-04-16 LAB — CMP (CANCER CENTER ONLY)
ALT: 17 U/L (ref 0–44)
AST: 23 U/L (ref 15–41)
Albumin: 3.8 g/dL (ref 3.5–5.0)
Alkaline Phosphatase: 63 U/L (ref 38–126)
Anion gap: 11 (ref 5–15)
BUN: 23 mg/dL — ABNORMAL HIGH (ref 6–20)
CO2: 22 mmol/L (ref 22–32)
Calcium: 9.5 mg/dL (ref 8.9–10.3)
Chloride: 102 mmol/L (ref 98–111)
Creatinine: 1.27 mg/dL — ABNORMAL HIGH (ref 0.61–1.24)
GFR, Estimated: >60 mL/min (ref >60)
Glucose, Bld: 139 mg/dL — ABNORMAL HIGH (ref 70–99)
Potassium: 3.4 mmol/L — ABNORMAL LOW (ref 3.5–5.1)
Sodium: 135 mmol/L (ref 135–145)
Total Bilirubin: 0.8 mg/dL (ref 0.0–1.2)
Total Protein: 7.6 g/dL (ref 6.5–8.1)

## 2024-04-16 LAB — CBC WITH DIFFERENTIAL (CANCER CENTER ONLY)
Abs Immature Granulocytes: 0.05 K/uL (ref 0.00–0.07)
Basophils Absolute: 0 K/uL (ref 0.0–0.1)
Basophils Relative: 1 %
Eosinophils Absolute: 0.4 K/uL (ref 0.0–0.5)
Eosinophils Relative: 6 %
HCT: 29.3 % — ABNORMAL LOW (ref 39.0–52.0)
Hemoglobin: 9.3 g/dL — ABNORMAL LOW (ref 13.0–17.0)
Immature Granulocytes: 1 %
Lymphocytes Relative: 19 %
Lymphs Abs: 1.4 K/uL (ref 0.7–4.0)
MCH: 23.8 pg — ABNORMAL LOW (ref 26.0–34.0)
MCHC: 31.7 g/dL (ref 30.0–36.0)
MCV: 75.1 fL — ABNORMAL LOW (ref 80.0–100.0)
Monocytes Absolute: 0.7 K/uL (ref 0.1–1.0)
Monocytes Relative: 9 %
Neutro Abs: 4.9 K/uL (ref 1.7–7.7)
Neutrophils Relative %: 64 %
Platelet Count: 206 K/uL (ref 150–400)
RBC: 3.9 MIL/uL — ABNORMAL LOW (ref 4.22–5.81)
RDW: 16.7 % — ABNORMAL HIGH (ref 11.5–15.5)
WBC Count: 7.5 K/uL (ref 4.0–10.5)
nRBC: 0 % (ref 0.0–0.2)

## 2024-04-16 LAB — TSH: TSH: 2.409 u[IU]/mL (ref 0.350–4.500)

## 2024-04-16 MED ORDER — SODIUM CHLORIDE 0.9 % IV SOLN
INTRAVENOUS | Status: DC
  Filled 2024-04-16: qty 250

## 2024-04-16 MED ORDER — SODIUM CHLORIDE 0.9 % IV SOLN
480.0000 mg | Freq: Once | INTRAVENOUS | Status: AC
  Administered 2024-04-16: 480 mg via INTRAVENOUS
  Filled 2024-04-16: qty 48

## 2024-04-16 NOTE — Patient Instructions (Signed)
 [facility] - A DEPT OF [facility]  Discharge Instructions: Thank you for choosing [facility] to provide your oncology and hematology care.  If you have a lab appointment with the Cancer Center, please go directly to the Cancer Center and check in at the registration area.  Wear comfortable clothing and clothing appropriate for easy access to any Portacath or PICC line.   We strive to give you quality time with your provider. You may need to reschedule your appointment if you arrive late (15 or more minutes).  Arriving late affects you and other patients whose appointments are after yours.  Also, if you miss three or more appointments without notifying the office, you may be dismissed from the clinic at the provider's discretion.      For prescription refill requests, have your pharmacy contact our office and allow 72 hours for refills to be completed.    Today you received the following chemotherapy and/or immunotherapy agents Opdivo      To help prevent nausea and vomiting after your treatment, we encourage you to take your nausea medication as directed.  BELOW ARE SYMPTOMS THAT SHOULD BE REPORTED IMMEDIATELY: *FEVER GREATER THAN 100.4 F (38 C) OR HIGHER *CHILLS OR SWEATING *NAUSEA AND VOMITING THAT IS NOT CONTROLLED WITH YOUR NAUSEA MEDICATION *UNUSUAL SHORTNESS OF BREATH *UNUSUAL BRUISING OR BLEEDING *URINARY PROBLEMS (pain or burning when urinating, or frequent urination) *BOWEL PROBLEMS (unusual diarrhea, constipation, pain near the anus) TENDERNESS IN MOUTH AND THROAT WITH OR WITHOUT PRESENCE OF ULCERS (sore throat, sores in mouth, or a toothache) UNUSUAL RASH, SWELLING OR PAIN  UNUSUAL VAGINAL DISCHARGE OR ITCHING   Items with * indicate a potential emergency and should be followed up as soon as possible or go to the Emergency Department if any problems should occur.  Please show the CHEMOTHERAPY ALERT CARD or IMMUNOTHERAPY ALERT  CARD at check-in to the Emergency Department and triage nurse.  Should you have questions after your visit or need to cancel or reschedule your appointment, please contact [facility] - A DEPT OF [facility]  [phone] and follow the prompts.  Office hours are 8:00 a.m. to 4:30 p.m. Monday - Friday. Please note that voicemails left after 4:00 p.m. may not be returned until the following business day.  We are closed weekends and major holidays. You have access to a nurse at all times for urgent questions. Please call the main number to the clinic [phone] and follow the prompts.  For any non-urgent questions, you may also contact your provider using MyChart. We now offer e-Visits for anyone 46 and older to request care online for non-urgent symptoms. For details visit [email or web address].   Also download the MyChart app! Go to the app store, search "MyChart", open the app, select [facility], and log in with your MyChart username and password.

## 2024-04-17 ENCOUNTER — Encounter: Payer: Self-pay | Admitting: Internal Medicine

## 2024-04-17 ENCOUNTER — Other Ambulatory Visit

## 2024-04-17 ENCOUNTER — Ambulatory Visit
Admission: RE | Admit: 2024-04-17 | Discharge: 2024-04-17 | Disposition: A | Discharge status: Home / Self Care | Source: Ambulatory Visit | Attending: Internal Medicine | Admitting: Internal Medicine

## 2024-04-17 ENCOUNTER — Ambulatory Visit: Admitting: Internal Medicine

## 2024-04-17 ENCOUNTER — Ambulatory Visit

## 2024-04-17 DIAGNOSIS — C642 Malignant neoplasm of left kidney, except renal pelvis: Secondary | ICD-10-CM

## 2024-04-17 LAB — T4: T4, Total: 8.1 ug/dL (ref 4.5–12.0)

## 2024-04-20 ENCOUNTER — Telehealth: Payer: Self-pay

## 2024-04-20 NOTE — Telephone Encounter (Signed)
Telephone call to patient for follow up after receiving first infusion.   No answer but left message stating we were calling to check on them.  Encouraged patient to call for any questions or concerns.   

## 2024-04-21 ENCOUNTER — Inpatient Hospital Stay: Payer: Self-pay

## 2024-04-21 VITALS — BP 124/74 | HR 67 | Temp 97.6°F | Resp 17

## 2024-04-21 DIAGNOSIS — D649 Anemia, unspecified: Secondary | ICD-10-CM

## 2024-04-21 DIAGNOSIS — Z5112 Encounter for antineoplastic immunotherapy: Secondary | ICD-10-CM | POA: Diagnosis not present

## 2024-04-21 MED ORDER — SODIUM CHLORIDE 0.9% FLUSH
10.0000 mL | Freq: Once | INTRAVENOUS | Status: AC | PRN
  Administered 2024-04-21: 10 mL
  Filled 2024-04-21: qty 10

## 2024-04-21 MED ORDER — IRON SUCROSE 20 MG/ML IV SOLN
200.0000 mg | Freq: Once | INTRAVENOUS | Status: AC
  Administered 2024-04-21: 200 mg via INTRAVENOUS
  Filled 2024-04-21: qty 10

## 2024-04-29 ENCOUNTER — Encounter: Payer: Self-pay | Admitting: Internal Medicine

## 2024-05-05 ENCOUNTER — Ambulatory Visit

## 2024-05-05 ENCOUNTER — Ambulatory Visit: Admitting: Oncology

## 2024-05-05 ENCOUNTER — Ambulatory Visit: Admitting: Nurse Practitioner

## 2024-05-05 ENCOUNTER — Other Ambulatory Visit

## 2024-05-12 ENCOUNTER — Encounter: Payer: Self-pay | Admitting: Internal Medicine

## 2024-05-14 ENCOUNTER — Ambulatory Visit: Admitting: Nurse Practitioner

## 2024-05-14 ENCOUNTER — Ambulatory Visit

## 2024-05-14 ENCOUNTER — Other Ambulatory Visit (HOSPITAL_COMMUNITY): Payer: Self-pay

## 2024-05-14 ENCOUNTER — Other Ambulatory Visit

## 2024-05-15 ENCOUNTER — Encounter: Payer: Self-pay | Admitting: Internal Medicine

## 2024-05-15 ENCOUNTER — Inpatient Hospital Stay (HOSPITAL_BASED_OUTPATIENT_CLINIC_OR_DEPARTMENT_OTHER): Payer: Self-pay | Admitting: Nurse Practitioner

## 2024-05-15 ENCOUNTER — Other Ambulatory Visit: Payer: Self-pay | Admitting: *Deleted

## 2024-05-15 ENCOUNTER — Encounter: Payer: Self-pay | Admitting: Nurse Practitioner

## 2024-05-15 ENCOUNTER — Other Ambulatory Visit: Payer: Self-pay

## 2024-05-15 ENCOUNTER — Inpatient Hospital Stay: Payer: Self-pay

## 2024-05-15 VITALS — BP 141/83 | HR 73

## 2024-05-15 VITALS — BP 139/85 | HR 73 | Temp 97.7°F | Resp 16 | Ht 67.0 in | Wt 214.4 lb

## 2024-05-15 DIAGNOSIS — D509 Iron deficiency anemia, unspecified: Secondary | ICD-10-CM

## 2024-05-15 DIAGNOSIS — R319 Hematuria, unspecified: Secondary | ICD-10-CM | POA: Insufficient documentation

## 2024-05-15 DIAGNOSIS — E1122 Type 2 diabetes mellitus with diabetic chronic kidney disease: Secondary | ICD-10-CM | POA: Insufficient documentation

## 2024-05-15 DIAGNOSIS — D649 Anemia, unspecified: Secondary | ICD-10-CM | POA: Insufficient documentation

## 2024-05-15 DIAGNOSIS — Z905 Acquired absence of kidney: Secondary | ICD-10-CM

## 2024-05-15 DIAGNOSIS — C649 Malignant neoplasm of unspecified kidney, except renal pelvis: Secondary | ICD-10-CM | POA: Insufficient documentation

## 2024-05-15 DIAGNOSIS — C642 Malignant neoplasm of left kidney, except renal pelvis: Secondary | ICD-10-CM

## 2024-05-15 DIAGNOSIS — R911 Solitary pulmonary nodule: Secondary | ICD-10-CM

## 2024-05-15 DIAGNOSIS — C772 Secondary and unspecified malignant neoplasm of intra-abdominal lymph nodes: Secondary | ICD-10-CM

## 2024-05-15 DIAGNOSIS — R809 Proteinuria, unspecified: Secondary | ICD-10-CM | POA: Insufficient documentation

## 2024-05-15 DIAGNOSIS — I10 Essential (primary) hypertension: Secondary | ICD-10-CM

## 2024-05-15 DIAGNOSIS — N1831 Chronic kidney disease, stage 3a: Secondary | ICD-10-CM | POA: Insufficient documentation

## 2024-05-15 DIAGNOSIS — Z5112 Encounter for antineoplastic immunotherapy: Secondary | ICD-10-CM | POA: Diagnosis not present

## 2024-05-15 DIAGNOSIS — E785 Hyperlipidemia, unspecified: Secondary | ICD-10-CM | POA: Insufficient documentation

## 2024-05-15 DIAGNOSIS — N2889 Other specified disorders of kidney and ureter: Secondary | ICD-10-CM

## 2024-05-15 LAB — CBC WITH DIFFERENTIAL (CANCER CENTER ONLY)
Abs Immature Granulocytes: 0.02 K/uL (ref 0.00–0.07)
Basophils Absolute: 0 K/uL (ref 0.0–0.1)
Basophils Relative: 0 %
Eosinophils Absolute: 0.3 K/uL (ref 0.0–0.5)
Eosinophils Relative: 5 %
HCT: 32.9 % — ABNORMAL LOW (ref 39.0–52.0)
Hemoglobin: 10.7 g/dL — ABNORMAL LOW (ref 13.0–17.0)
Immature Granulocytes: 0 %
Lymphocytes Relative: 17 %
Lymphs Abs: 1.2 K/uL (ref 0.7–4.0)
MCH: 24.3 pg — ABNORMAL LOW (ref 26.0–34.0)
MCHC: 32.5 g/dL (ref 30.0–36.0)
MCV: 74.6 fL — ABNORMAL LOW (ref 80.0–100.0)
Monocytes Absolute: 0.5 K/uL (ref 0.1–1.0)
Monocytes Relative: 8 %
Neutro Abs: 4.7 K/uL (ref 1.7–7.7)
Neutrophils Relative %: 70 %
Platelet Count: 207 K/uL (ref 150–400)
RBC: 4.41 MIL/uL (ref 4.22–5.81)
RDW: 17.4 % — ABNORMAL HIGH (ref 11.5–15.5)
WBC Count: 6.7 K/uL (ref 4.0–10.5)
nRBC: 0 % (ref 0.0–0.2)

## 2024-05-15 LAB — CMP (CANCER CENTER ONLY)
ALT: 37 U/L (ref 0–44)
AST: 41 U/L (ref 15–41)
Albumin: 3.2 g/dL — ABNORMAL LOW (ref 3.5–5.0)
Alkaline Phosphatase: 89 U/L (ref 38–126)
Anion gap: 12 (ref 5–15)
BUN: 17 mg/dL (ref 6–20)
CO2: 24 mmol/L (ref 22–32)
Calcium: 8.8 mg/dL — ABNORMAL LOW (ref 8.9–10.3)
Chloride: 99 mmol/L (ref 98–111)
Creatinine: 1.53 mg/dL — ABNORMAL HIGH (ref 0.61–1.24)
GFR, Estimated: 54 mL/min — ABNORMAL LOW (ref >60)
Glucose, Bld: 162 mg/dL — ABNORMAL HIGH (ref 70–99)
Potassium: 3.1 mmol/L — ABNORMAL LOW (ref 3.5–5.1)
Sodium: 135 mmol/L (ref 135–145)
Total Bilirubin: 0.7 mg/dL (ref 0.0–1.2)
Total Protein: 7.6 g/dL (ref 6.5–8.1)

## 2024-05-15 LAB — PROTEIN / CREATININE RATIO, URINE
Creatinine, Urine: 116 mg/dL
Protein Creatinine Ratio: 0.92 mg/mg{creat} — ABNORMAL HIGH (ref 0.00–0.15)
Total Protein, Urine: 107 mg/dL

## 2024-05-15 MED ORDER — SODIUM CHLORIDE 0.9 % IV SOLN
480.0000 mg | Freq: Once | INTRAVENOUS | Status: AC
  Administered 2024-05-15: 480 mg via INTRAVENOUS
  Filled 2024-05-15: qty 48

## 2024-05-15 MED ORDER — IRON SUCROSE 20 MG/ML IV SOLN
200.0000 mg | Freq: Once | INTRAVENOUS | Status: AC
  Administered 2024-05-15: 200 mg via INTRAVENOUS
  Filled 2024-05-15: qty 10

## 2024-05-15 MED ORDER — SODIUM CHLORIDE 0.9 % IV SOLN
INTRAVENOUS | Status: DC
  Filled 2024-05-15: qty 250

## 2024-05-15 NOTE — Progress Notes (Signed)
 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 Sniskinski]; July CT 2020- mid anterior left kidney measuring 8.6 x 8.1 x 9.4 cm; CT chest negative. #April 06, 2019-ADJUVANT Sutent  50 mg 2 weeks on 1 week off.    # SEP 23rd, 2025-  STAGE IV RECURRENCE- s/p Bx- CABO- + NIVO     # DM- 2- OHA/ HTN  DIAGNOSIS: Left kidney cancer  STAGE: 3       ;GOALS: Cure  CURRENT/MOST RECENT THERAPY: Sutent     History of kidney cancer  02/26/2019 Initial Diagnosis   Cancer of left kidney (HCC)   Cancer of left kidney (HCC)  12/16/2020 Initial Diagnosis   Cancer of left kidney (HCC)   04/16/2024 -  Chemotherapy   Patient is on Treatment Plan : RENAL CELL Nivolumab (480) q28d       HISTORY OF PRESENTING ILLNESS: Patient ambulating independently.    Charles Mathis 53 y.o. 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  He received IV Venofer  on 04/21/2024 hematuria with associated iron  deficiency.  He was unable to tolerate oral iron  due to constipation.  He says that he tolerated cycle 1 well without significant side effects.  Appetite is good.  Weight is down 3 pounds.  Denies any hematuria.  No nausea or vomiting.  No diarrhea.  No rashes.   Review of Systems  Constitutional:  Negative for chills, diaphoresis, fever, malaise/fatigue and weight loss.  HENT:  Negative for nosebleeds and sore throat.   Eyes:  Negative for double vision.   Respiratory:  Negative for cough, hemoptysis, sputum production, shortness of breath and wheezing.   Cardiovascular:  Negative for chest pain, palpitations, orthopnea and leg swelling.  Gastrointestinal:  Negative for abdominal pain, blood in stool, constipation, diarrhea, heartburn, melena, nausea and vomiting.  Genitourinary:  Negative for dysuria, frequency and urgency.  Musculoskeletal:  Negative for back pain and joint pain.  Skin:  Negative for itching and rash.  Neurological:  Negative for dizziness, tingling, focal weakness, weakness and headaches.  Endo/Heme/Allergies:  Does not bruise/bleed easily.  Psychiatric/Behavioral:  Negative for depression. The patient is not nervous/anxious and does not have insomnia.    MEDICAL HISTORY:  Past Medical History:  Diagnosis Date   Allergy    Diabetes mellitus without complication (HCC)    Hypertension    Renal cancer, left (HCC) 01/2019   Left Nephrectomy    SURGICAL HISTORY: Past Surgical History:  Procedure Laterality Date   CYSTOSCOPY  02/06/2019   Procedure: CYSTOSCOPY;  Surgeon: Francisca Redell BROCKS, MD;  Location: [facility] ORS;  Service: Urology;;   LAPAROSCOPIC NEPHRECTOMY, HAND ASSISTED Left 02/06/2019   Procedure: HAND ASSISTED LAPAROSCOPIC LEFT RADICAL NEPHRECTOMY;  Surgeon: Francisca Redell BROCKS, MD;  Location: [facility] ORS;  Service: Urology;  Laterality: Left;   NO PAST SURGERIES      SOCIAL HISTORY: Social History   Socioeconomic History   Marital status: Married    Spouse name: Not on file   Number of children: Not on file  Years of education: Not on file   Highest education level: GED or equivalent  Occupational History   Not on file  Tobacco Use   Smoking status: Never   Smokeless tobacco: Never  Vaping Use   Vaping status: Never Used  Substance and Sexual Activity   Alcohol use: No   Drug use: No   Sexual activity: Yes    Birth control/protection: Other-see comments    Comment: mutually monogamous relationship  partner postmenopausal  Other Topics Concern   Not on file  Social History Narrative   In [place]/ with wife; son [downs syndrome]; no smoking/ no alcohol; works for engineer, water.    Social Drivers of Corporate Investment Banker Strain: High Risk (03/05/2024)   Overall Financial Resource Strain (CARDIA)    Difficulty of Paying Living Expenses: Hard  Food Insecurity: No Food Insecurity (03/20/2024)   Hunger Vital Sign    Worried About Running Out of Food in the Last Year: Never true    Ran Out of Food in the Last Year: Never true  Recent Concern: Food Insecurity - Food Insecurity Present (03/05/2024)   Hunger Vital Sign    Worried About Running Out of Food in the Last Year: Sometimes true    Ran Out of Food in the Last Year: Sometimes true  Transportation Needs: No Transportation Needs (03/20/2024)   PRAPARE - Administrator, Civil Service (Medical): No    Lack of Transportation (Non-Medical): No  Physical Activity: Insufficiently Active (03/05/2024)   Exercise Vital Sign    Days of Exercise per Week: 5 days    Minutes of Exercise per Session: 20 min  Stress: No Stress Concern Present (03/05/2024)   [employer] of Occupational Health - Occupational Stress Questionnaire    Feeling of Stress: Only a [place]  Social Connections: Unknown (03/05/2024)   Social Connection and Isolation Panel    Frequency of Communication with Friends and Family: Once a week    Frequency of Social Gatherings with Friends and Family: Patient declined    Attends Religious Services: Never    Database Administrator or Organizations: No    Attends Engineer, Structural: Not on file    Marital Status: Married  Catering Manager Violence: Not At Risk (03/20/2024)   Humiliation, Afraid, Rape, and Kick questionnaire    Fear of Current or Ex-Partner: No    Emotionally Abused: No    Physically Abused: No    Sexually Abused: No    FAMILY HISTORY: Family History  Problem Relation  Age of Onset   Diabetes Mother    Heart disease Mother    Mental illness Mother    Thyroid  disease Mother    Heart disease Father    Diabetes Father    Mental illness Father     ALLERGIES:  has no known allergies.  MEDICATIONS:  Current Outpatient Medications  Medication Sig Dispense Refill   acetaminophen  (TYLENOL ) 325 MG tablet Take 650 mg by mouth every 6 (six) hours as needed for headache.     amLODipine  (NORVASC ) 10 MG tablet Take 1 tablet (10 mg total) by mouth daily. 90 tablet 0   blood glucose meter kit and supplies Dispense based on patient and insurance preference. Use to check blood sugar daily as directed. DX: E11.9. 1 each 0   cabozantinib (CABOMETYX) 40 MG tablet Take 40 mg by mouth daily. Take on an empty stomach, 1 hour before or 2 hours after meals.     carvedilol  (  COREG ) 25 MG tablet Take 1 tablet (25 mg total) by mouth 2 (two) times daily with a meal. 180 tablet 1   fluticasone  (FLONASE ) 50 MCG/ACT nasal spray Place 2 sprays into both nostrils daily. 48 g 0   levothyroxine  (SYNTHROID ) 100 MCG tablet Take 1 tablet (100 mcg total) by mouth daily. 90 tablet 1   loratadine (CLARITIN) 10 MG tablet Take 10 mg by mouth daily.     metFORMIN  (GLUCOPHAGE ) 1000 MG tablet Take 1 tablet (1,000 mg total) by mouth 2 (two) times daily with a meal. 180 tablet 0   montelukast  (SINGULAIR ) 10 MG tablet Take 1 tablet (10 mg total) by mouth once daily. 90 tablet 1   olmesartan  (BENICAR ) 40 MG tablet Take 1 tablet (40 mg total) by mouth daily. 90 tablet 0   rosuvastatin  (CRESTOR ) 40 MG tablet Take 1 tablet (40 mg total) by mouth daily. 90 tablet 3   ondansetron  (ZOFRAN ) 8 MG tablet Take 1 tablet (8 mg total) by mouth every 8 (eight) hours as needed for nausea or vomiting. (Patient not taking: Reported on 05/15/2024) 20 tablet 2   prochlorperazine (COMPAZINE) 10 MG tablet Take 1 tablet (10 mg total) by mouth every 6 (six) hours as needed. (Patient not taking: Reported on 05/15/2024) 30 tablet  2   No current facility-administered medications for this visit.    PHYSICAL EXAMINATION: Vitals:   05/15/24 1348  BP: 139/85  Pulse: 73  Resp: 16  Temp: 97.7 F (36.5 C)  SpO2: 96%   Filed Weights   05/15/24 1348  Weight: 214 lb 6.4 oz (97.3 kg)   Physical Exam Vitals reviewed.  Constitutional:      Appearance: He is not ill-appearing.  Cardiovascular:     Rate and Rhythm: Normal rate and regular rhythm.  Abdominal:     General: There is no distension.     Palpations: Abdomen is soft.     Tenderness: There is no abdominal tenderness. There is no guarding.  Musculoskeletal:        General: No deformity.     Right lower leg: No edema.     Left lower leg: No edema.  Skin:    General: Skin is warm and dry.     Findings: No rash.  Neurological:     Mental Status: He is alert and oriented to person, place, and time.  Psychiatric:        Mood and Affect: Mood normal.        Behavior: Behavior normal.    LABORATORY DATA:  I have reviewed the data as listed Lab Results  Component Value Date   WBC 6.7 05/15/2024   HGB 10.7 (L) 05/15/2024   HCT 32.9 (L) 05/15/2024   MCV 74.6 (L) 05/15/2024   PLT 207 05/15/2024   Recent Labs    04/07/24 0949 04/16/24 0927 05/15/24 1318  NA 136 135 135  K 3.5 3.4* 3.1*  CL 101 102 99  CO2 22 22 24   GLUCOSE 157* 139* 162*  BUN 26* 23* 17  CREATININE 1.46* 1.27* 1.53*  CALCIUM  9.4 9.5 8.8*  GFRNONAA 57* >60 54*  PROT 7.9 7.6 7.6  ALBUMIN  3.9 3.8 3.2*  AST 20 23 41  ALT 13 17 37  ALKPHOS 72 63 89  BILITOT 0.9 0.8 0.7    RADIOGRAPHIC STUDIES: I have personally reviewed the radiological images as listed and agreed with the findings in the report. CT CHEST WO CONTRAST Result Date: 04/17/2024 CLINICAL DATA:  Lung nodule. History  of kidney cancer. Restaging. * Tracking Code: BO * EXAM: CT CHEST WITHOUT CONTRAST TECHNIQUE: Multidetector CT imaging of the chest was performed following the standard protocol without IV contrast.  RADIATION DOSE REDUCTION: This exam was performed according to the departmental dose-optimization program which includes automated exposure control, adjustment of the mA and/or kV according to patient size and/or use of iterative reconstruction technique. COMPARISON:  Chest CT 09/12/2020.  CT stone study 03/14/2024. FINDINGS: Cardiovascular: The heart size is normal. No substantial pericardial effusion. Coronary artery calcification is evident. Mild atherosclerotic calcification is noted in the wall of the thoracic aorta. Mediastinum/Nodes: No mediastinal lymphadenopathy. No evidence for gross hilar lymphadenopathy although assessment is limited by the lack of intravenous contrast on the current study. The esophagus has normal imaging features. There is no axillary lymphadenopathy. Lungs/Pleura: The patchy bilateral airspace opacity seen on the previous study have largely resolved in the interval. However, there are new multiple new pulmonary nodules. These include a 2.1 x 1.8 cm relatively smoothly marginated nodule along the left major fissure (79/3). 1.0 x 1.0 cm anterior left upper lobe nodule on 67/3 is new in the interval. 9 mm left lower lobe pulmonary nodule on 91/3 is new in the interval. 7 mm perifissural right lower lobe nodule on 83/3 is new. 10 mm suprahilar right lung nodule on 39/3 is new. Multiple additional scattered small bilateral pulmonary nodules are new since prior. No focal airspace consolidation.  No pleural effusion. Upper Abdomen: There is an apparent masslike enlargement in the lateral segment of the left liver similar to recent CT stone study but progressive since prior chest CT, measuring as large as 8.8 x 2.7 cm on image 126/2. Small right adrenal nodule is similar to CT stone study. Status post left nephrectomy. 5.5 x 2.3 cm soft tissue lesion in the nephrectomy bed on image 143/2 and adjacent 2.5 x 1.8 cm nodule on 139/2, similar to prior. Musculoskeletal: No worrisome lytic or  sclerotic osseous abnormality. IMPRESSION: 1. Interval development of multiple new bilateral pulmonary nodules measuring up to 2.1 cm. Imaging features are highly concerning for metastatic disease. 2. Masslike enlargement in the lateral segment of the left liver, similar to recent CT stone study but progressive since prior chest CT. This is concerning for metastatic disease. 3. Status post left nephrectomy with soft tissue lesions in the nephrectomy bed, similar to prior. And compatible with recurrence 4. Small right adrenal nodule, similar to CT stone study. Metastatic disease a concern. 5. Lower pole right renal lesion seen on previous CT stone study not included in the field of view today. 6.  Aortic Atherosclerosis (ICD10-I70.0). Electronically Signed   By: Camellia Candle M.D.   On: 04/17/2024 10:41   Cancer of left kidney (HCC) # Hx Left kidney cancer-clear cell carcinoma [ 2021]; # AUG 28th, 2025- ER- CT kidney protocol:  Interval development of multiple hyperdense masses within the nephrectomy bed and within the left periaortic region suspicious for recurrent disease; Interval development of an indeterminate 15 mm nodule within the right adrenal gland; Interval development of a 18 mm mean diameter nodule within the left lower lobe suspicious for development of pulmonary metastatic Disease. ALSO - RIGHT KIDNEY- least 4.6 x 5.2 cm, lower pole suspicious for a primary renal neoplasm or a metastasis.    # Lymph node, biopsy, left RP : CLEAR CELL- METASTATIC RENAL CELL CARCINOMA;  Diagnosis Note : - Immunohistochemical stains CK AE1/AE3, PAX8 and CD10 are  Positive consistent with involvement by the patient's known  renal cell carcinoma # IMDC- FAVORABLE- high volume-symptomatic disease- PROCEED WITH p.o. cabozantinib 40 mg daily along with nivolumab every 4 weeks. POOR DENTITION-/ no insurance HOLD ZOMETA   # Tolerated cycle 1 of nivolumab well without significant side effects.  Labs today were reviewed with  patient.  Acceptable for treatment.  Proceed with cycle 2 of nivolumab today. Continue cabometyx. Tolerating well.   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 Proceed with venofer  today. Hematuria has resolved.    # Goals of care- treatment given with palliative intent.    # History of hypothyroidism-  on synthroid  100 mcg/d-    # HTN- well controlled-    # ACP: done-   PS  # DISPOSITION: # Nivo + venofer  today-  # 4 weeks- labs- (cbc/cmp/random urine protein creatinine ratio), Dr Rennie, Nivolumab + venofer - la  No problem-specific Assessment & Plan notes found for this encounter.   Tinnie KANDICE Dawn, NP 05/15/2024

## 2024-05-15 NOTE — Progress Notes (Signed)
Pt in for follow up, denies any difficulties or concerns.  

## 2024-05-18 ENCOUNTER — Telehealth: Payer: Self-pay | Admitting: Internal Medicine

## 2024-05-18 ENCOUNTER — Other Ambulatory Visit: Payer: Self-pay | Admitting: Internal Medicine

## 2024-05-18 NOTE — Progress Notes (Signed)
 Add to MD schedule- on 12/05- MD; labs- cbc/cmp; thyroid  profile- infusion.  GB

## 2024-05-18 NOTE — Telephone Encounter (Signed)
 Patient called and is requesting to change 11/26 appointments to 12/5 due to work. He is ok seeing the NP and says he will see appointments in mychart for the change.   Can you update the request to be for 12/5?   Thank you

## 2024-05-20 ENCOUNTER — Other Ambulatory Visit: Payer: Self-pay | Admitting: Internal Medicine

## 2024-05-20 DIAGNOSIS — I10 Essential (primary) hypertension: Secondary | ICD-10-CM

## 2024-05-21 ENCOUNTER — Inpatient Hospital Stay: Payer: Self-pay

## 2024-05-21 ENCOUNTER — Other Ambulatory Visit: Payer: Self-pay

## 2024-05-21 ENCOUNTER — Encounter: Payer: Self-pay | Admitting: Internal Medicine

## 2024-05-21 ENCOUNTER — Other Ambulatory Visit: Payer: Self-pay | Admitting: Internal Medicine

## 2024-05-21 DIAGNOSIS — I10 Essential (primary) hypertension: Secondary | ICD-10-CM

## 2024-05-22 ENCOUNTER — Other Ambulatory Visit: Payer: Self-pay

## 2024-05-22 ENCOUNTER — Encounter: Payer: Self-pay | Admitting: Internal Medicine

## 2024-05-22 MED FILL — Amlodipine Besylate Tab 10 MG (Base Equivalent): ORAL | 90 days supply | Qty: 90 | Fill #0 | Status: AC

## 2024-05-22 NOTE — Telephone Encounter (Signed)
 Requested Prescriptions  Pending Prescriptions Disp Refills   amLODipine  (NORVASC ) 10 MG tablet 90 tablet 0    Sig: Take 1 tablet (10 mg total) by mouth daily.     Cardiovascular: Calcium  Channel Blockers 2 Failed - 05/22/2024  4:13 PM      Failed - Last BP in normal range    BP Readings from Last 1 Encounters:  05/15/24 (!) 141/83         Passed - Last Heart Rate in normal range    Pulse Readings from Last 1 Encounters:  05/15/24 73         Passed - Valid encounter within last 6 months    Recent Outpatient Visits           2 months ago Encounter for general adult medical examination with abnormal findings   [place] [facility] [place], [place] W, NP   8 months ago Type 2 diabetes mellitus with hyperglycemia, without long-term current use of insulin  [facility])   [place] [facility] [place], Angeline ORN, [place]

## 2024-05-29 ENCOUNTER — Inpatient Hospital Stay: Payer: Self-pay | Attending: Internal Medicine

## 2024-05-29 ENCOUNTER — Encounter: Payer: Self-pay | Admitting: Internal Medicine

## 2024-05-29 VITALS — BP 153/96 | HR 71 | Temp 96.1°F | Resp 18

## 2024-05-29 DIAGNOSIS — R319 Hematuria, unspecified: Secondary | ICD-10-CM | POA: Insufficient documentation

## 2024-05-29 DIAGNOSIS — C642 Malignant neoplasm of left kidney, except renal pelvis: Secondary | ICD-10-CM | POA: Diagnosis not present

## 2024-05-29 DIAGNOSIS — D508 Other iron deficiency anemias: Secondary | ICD-10-CM | POA: Diagnosis present

## 2024-05-29 DIAGNOSIS — D649 Anemia, unspecified: Secondary | ICD-10-CM

## 2024-05-29 MED ORDER — IRON SUCROSE 20 MG/ML IV SOLN
200.0000 mg | Freq: Once | INTRAVENOUS | Status: AC
  Administered 2024-05-29: 200 mg via INTRAVENOUS
  Filled 2024-05-29: qty 10

## 2024-05-29 MED ORDER — SODIUM CHLORIDE 0.9% FLUSH
10.0000 mL | Freq: Once | INTRAVENOUS | Status: AC | PRN
  Administered 2024-05-29: 10 mL
  Filled 2024-05-29: qty 10

## 2024-06-02 ENCOUNTER — Ambulatory Visit

## 2024-06-02 ENCOUNTER — Ambulatory Visit: Admitting: Internal Medicine

## 2024-06-02 ENCOUNTER — Other Ambulatory Visit

## 2024-06-05 ENCOUNTER — Inpatient Hospital Stay: Payer: Self-pay

## 2024-06-07 MED FILL — Carvedilol Tab 25 MG: ORAL | 30 days supply | Qty: 60 | Fill #2 | Status: CN

## 2024-06-08 ENCOUNTER — Encounter: Payer: Self-pay | Admitting: Internal Medicine

## 2024-06-08 ENCOUNTER — Other Ambulatory Visit: Payer: Self-pay

## 2024-06-08 MED FILL — Carvedilol Tab 25 MG: ORAL | 30 days supply | Qty: 60 | Fill #2 | Status: AC

## 2024-06-10 ENCOUNTER — Other Ambulatory Visit

## 2024-06-10 ENCOUNTER — Ambulatory Visit

## 2024-06-10 ENCOUNTER — Ambulatory Visit: Admitting: Internal Medicine

## 2024-06-15 ENCOUNTER — Other Ambulatory Visit: Payer: Self-pay

## 2024-06-15 ENCOUNTER — Encounter: Payer: Self-pay | Admitting: Internal Medicine

## 2024-06-15 DIAGNOSIS — E039 Hypothyroidism, unspecified: Secondary | ICD-10-CM

## 2024-06-15 MED ORDER — LEVOTHYROXINE SODIUM 100 MCG PO TABS: 100.0000 ug | ORAL_TABLET | Freq: Every day | ORAL | 1 refills | Status: AC

## 2024-06-18 ENCOUNTER — Other Ambulatory Visit: Payer: Self-pay | Admitting: Internal Medicine

## 2024-06-19 ENCOUNTER — Inpatient Hospital Stay: Payer: Self-pay

## 2024-06-19 ENCOUNTER — Inpatient Hospital Stay: Payer: Self-pay | Admitting: Internal Medicine

## 2024-06-26 ENCOUNTER — Encounter: Payer: Self-pay | Admitting: Nurse Practitioner

## 2024-06-26 ENCOUNTER — Inpatient Hospital Stay: Payer: Self-pay | Attending: Internal Medicine

## 2024-06-26 ENCOUNTER — Inpatient Hospital Stay (HOSPITAL_BASED_OUTPATIENT_CLINIC_OR_DEPARTMENT_OTHER): Payer: Self-pay | Admitting: Nurse Practitioner

## 2024-06-26 ENCOUNTER — Inpatient Hospital Stay: Payer: Self-pay

## 2024-06-26 VITALS — BP 137/92 | HR 68 | Temp 97.1°F | Ht 67.0 in | Wt 222.0 lb

## 2024-06-26 VITALS — BP 146/92 | HR 60 | Temp 96.6°F | Resp 19

## 2024-06-26 DIAGNOSIS — C642 Malignant neoplasm of left kidney, except renal pelvis: Secondary | ICD-10-CM

## 2024-06-26 DIAGNOSIS — C641 Malignant neoplasm of right kidney, except renal pelvis: Secondary | ICD-10-CM | POA: Insufficient documentation

## 2024-06-26 DIAGNOSIS — I10 Essential (primary) hypertension: Secondary | ICD-10-CM

## 2024-06-26 DIAGNOSIS — C772 Secondary and unspecified malignant neoplasm of intra-abdominal lymph nodes: Secondary | ICD-10-CM

## 2024-06-26 DIAGNOSIS — D649 Anemia, unspecified: Secondary | ICD-10-CM

## 2024-06-26 DIAGNOSIS — D509 Iron deficiency anemia, unspecified: Secondary | ICD-10-CM

## 2024-06-26 DIAGNOSIS — Z79620 Long term (current) use of immunosuppressive biologic: Secondary | ICD-10-CM | POA: Insufficient documentation

## 2024-06-26 DIAGNOSIS — R319 Hematuria, unspecified: Secondary | ICD-10-CM | POA: Insufficient documentation

## 2024-06-26 DIAGNOSIS — E119 Type 2 diabetes mellitus without complications: Secondary | ICD-10-CM | POA: Insufficient documentation

## 2024-06-26 DIAGNOSIS — Z5112 Encounter for antineoplastic immunotherapy: Secondary | ICD-10-CM

## 2024-06-26 DIAGNOSIS — D508 Other iron deficiency anemias: Secondary | ICD-10-CM | POA: Insufficient documentation

## 2024-06-26 DIAGNOSIS — Z9221 Personal history of antineoplastic chemotherapy: Secondary | ICD-10-CM

## 2024-06-26 LAB — CMP (CANCER CENTER ONLY)
ALT: 79 U/L — ABNORMAL HIGH (ref 0–44)
AST: 60 U/L — ABNORMAL HIGH (ref 15–41)
Albumin: 3.7 g/dL (ref 3.5–5.0)
Alkaline Phosphatase: 116 U/L (ref 38–126)
Anion gap: 13 (ref 5–15)
BUN: 17 mg/dL (ref 6–20)
CO2: 22 mmol/L (ref 22–32)
Calcium: 9 mg/dL (ref 8.9–10.3)
Chloride: 104 mmol/L (ref 98–111)
Creatinine: 1.12 mg/dL (ref 0.61–1.24)
GFR, Estimated: >60 mL/min (ref >60)
Glucose, Bld: 162 mg/dL — ABNORMAL HIGH (ref 70–99)
Potassium: 3.4 mmol/L — ABNORMAL LOW (ref 3.5–5.1)
Sodium: 139 mmol/L (ref 135–145)
Total Bilirubin: 0.4 mg/dL (ref 0.0–1.2)
Total Protein: 7.5 g/dL (ref 6.5–8.1)

## 2024-06-26 LAB — CBC WITH DIFFERENTIAL (CANCER CENTER ONLY)
Abs Immature Granulocytes: 0.04 K/uL (ref 0.00–0.07)
Basophils Absolute: 0 K/uL (ref 0.0–0.1)
Basophils Relative: 0 %
Eosinophils Absolute: 0.6 K/uL — ABNORMAL HIGH (ref 0.0–0.5)
Eosinophils Relative: 8 %
HCT: 36.4 % — ABNORMAL LOW (ref 39.0–52.0)
Hemoglobin: 11.9 g/dL — ABNORMAL LOW (ref 13.0–17.0)
Immature Granulocytes: 1 %
Lymphocytes Relative: 20 %
Lymphs Abs: 1.4 K/uL (ref 0.7–4.0)
MCH: 26.2 pg (ref 26.0–34.0)
MCHC: 32.7 g/dL (ref 30.0–36.0)
MCV: 80.2 fL (ref 80.0–100.0)
Monocytes Absolute: 0.6 K/uL (ref 0.1–1.0)
Monocytes Relative: 8 %
Neutro Abs: 4.3 K/uL (ref 1.7–7.7)
Neutrophils Relative %: 63 %
Platelet Count: 227 K/uL (ref 150–400)
RBC: 4.54 MIL/uL (ref 4.22–5.81)
RDW: 19.6 % — ABNORMAL HIGH (ref 11.5–15.5)
WBC Count: 6.9 K/uL (ref 4.0–10.5)
nRBC: 0 % (ref 0.0–0.2)

## 2024-06-26 LAB — TSH: TSH: 2.06 u[IU]/mL (ref 0.350–4.500)

## 2024-06-26 LAB — PROTEIN / CREATININE RATIO, URINE
Creatinine, Urine: 82 mg/dL
Protein Creatinine Ratio: 1.5 mg/mg{creat} — ABNORMAL HIGH (ref 0.00–0.15)
Total Protein, Urine: 123 mg/dL

## 2024-06-26 MED ORDER — IRON SUCROSE 20 MG/ML IV SOLN
200.0000 mg | Freq: Once | INTRAVENOUS | Status: AC
  Administered 2024-06-26: 200 mg via INTRAVENOUS
  Filled 2024-06-26: qty 10

## 2024-06-26 MED ORDER — SODIUM CHLORIDE 0.9 % IV SOLN
480.0000 mg | Freq: Once | INTRAVENOUS | Status: AC
  Administered 2024-06-26: 15:00:00 480 mg via INTRAVENOUS
  Filled 2024-06-26: qty 48

## 2024-06-26 MED ORDER — SODIUM CHLORIDE 0.9 % IV SOLN
INTRAVENOUS | Status: DC
  Filled 2024-06-26: qty 250

## 2024-06-26 NOTE — Addendum Note (Signed)
 Addended by: Marquisa Salih G on: 06/26/2024 03:09 PM   Modules accepted: Orders

## 2024-06-26 NOTE — Addendum Note (Signed)
 Addended by: Avilyn Virtue G on: 06/26/2024 03:46 PM   Modules accepted: Orders

## 2024-06-26 NOTE — Progress Notes (Addendum)
 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 Sniskinski]; July CT 2020- mid anterior left kidney measuring 8.6 x 8.1 x 9.4 cm; CT chest negative. #April 06, 2019-ADJUVANT Sutent  50 mg 2 weeks on 1 week off.    # SEP 23rd, 2025-  STAGE IV RECURRENCE- s/p Bx- CABO- + NIVO     # DM- 2- OHA/ HTN  DIAGNOSIS: Left kidney cancer  STAGE: 3       ;GOALS: Cure  CURRENT/MOST RECENT THERAPY: Sutent     History of kidney cancer  02/26/2019 Initial Diagnosis   Cancer of left kidney (HCC)   Cancer of left kidney (HCC)  12/16/2020 Initial Diagnosis   Cancer of left kidney (HCC)   04/16/2024 -  Chemotherapy   Patient is on Treatment Plan : RENAL CELL Nivolumab  (480) q28d       HISTORY OF PRESENTING ILLNESS: Patient ambulating independently.    Charles Mathis 53 y.o. 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  He received 4 doses of IV venofer  05/29/24. Tolerating treatment well without significant side effects. Denies edema. No nausea or vomiting. No diarrhea. Denies rashes. Denies flank pain. Has been taking increasing tylenol  for history of back pain.    Review of Systems  Constitutional:  Negative for chills, diaphoresis, fever, malaise/fatigue and weight loss.  HENT:  Negative for nosebleeds and sore throat.   Eyes:  Negative for double vision.  Respiratory:  Negative for cough, hemoptysis, sputum production, shortness of  breath and wheezing.   Cardiovascular:  Negative for chest pain, palpitations, orthopnea and leg swelling.  Gastrointestinal:  Negative for abdominal pain, blood in stool, constipation, diarrhea, heartburn, melena, nausea and vomiting.  Genitourinary:  Negative for dysuria, frequency and urgency.  Musculoskeletal:  Negative for back pain and joint pain.  Skin:  Negative for itching and rash.  Neurological:  Negative for dizziness, tingling, focal weakness, weakness and headaches.  Endo/Heme/Allergies:  Does not bruise/bleed easily.  Psychiatric/Behavioral:  Negative for depression. The patient is not nervous/anxious and does not have insomnia.     MEDICAL HISTORY:  Past Medical History:  Diagnosis Date   Allergy    Diabetes mellitus without complication (HCC)    Hypertension    Renal cancer, left (HCC) 01/2019   Left Nephrectomy    SURGICAL HISTORY: Past Surgical History:  Procedure Laterality Date   CYSTOSCOPY  02/06/2019   Procedure: CYSTOSCOPY;  Surgeon: Francisca Redell BROCKS, MD;  Location: [facility] ORS;  Service: Urology;;   LAPAROSCOPIC NEPHRECTOMY, HAND ASSISTED Left 02/06/2019   Procedure: HAND ASSISTED LAPAROSCOPIC LEFT RADICAL NEPHRECTOMY;  Surgeon: Francisca Redell BROCKS, MD;  Location: [facility] ORS;  Service: Urology;  Laterality: Left;   NO PAST SURGERIES      SOCIAL HISTORY: Social History   Socioeconomic History   Marital status: Married    Spouse name: Not on file   Number of children: Not on file   Years of education: Not on file   Highest education level: GED or equivalent  Occupational History  Not on file  Tobacco Use   Smoking status: Never   Smokeless tobacco: Never  Vaping Use   Vaping status: Never Used  Substance and Sexual Activity   Alcohol use: No   Drug use: No   Sexual activity: Yes    Birth control/protection: Other-see comments    Comment: mutually monogamous relationship partner postmenopausal  Other Topics Concern   Not on file  Social History  Narrative   In [place]/ with wife; son [downs syndrome]; no smoking/ no alcohol; works for engineer, water.    Social Drivers of Health   Tobacco Use: Low Risk (06/26/2024)   Patient History    Smoking Tobacco Use: Never    Smokeless Tobacco Use: Never    Passive Exposure: Not on file  Financial Resource Strain: High Risk (03/05/2024)   Overall Financial Resource Strain (CARDIA)    Difficulty of Paying Living Expenses: Hard  Food Insecurity: No Food Insecurity (03/20/2024)   Epic    Worried About Programme Researcher, Broadcasting/film/video in the Last Year: Never true    Ran Out of Food in the Last Year: Never true  Recent Concern: Food Insecurity - Food Insecurity Present (03/05/2024)   Epic    Worried About Programme Researcher, Broadcasting/film/video in the Last Year: Sometimes true    Ran Out of Food in the Last Year: Sometimes true  Transportation Needs: No Transportation Needs (03/20/2024)   Epic    Lack of Transportation (Medical): No    Lack of Transportation (Non-Medical): No  Physical Activity: Insufficiently Active (03/05/2024)   Exercise Vital Sign    Days of Exercise per Week: 5 days    Minutes of Exercise per Session: 20 min  Stress: No Stress Concern Present (03/05/2024)   [employer] of Occupational Health - Occupational Stress Questionnaire    Feeling of Stress: Only a [place]  Social Connections: Unknown (03/05/2024)   Social Connection and Isolation Panel    Frequency of Communication with Friends and Family: Once a week    Frequency of Social Gatherings with Friends and Family: Patient declined    Attends Religious Services: Never    Database Administrator or Organizations: No    Attends Engineer, Structural: Not on file    Marital Status: Married  Catering Manager Violence: Not At Risk (03/20/2024)   Epic    Fear of Current or Ex-Partner: No    Emotionally Abused: No    Physically Abused: No    Sexually Abused: No  Depression (PHQ2-9): Low Risk (06/26/2024)   Depression (PHQ2-9)     PHQ-2 Score: 0  Alcohol Screen: Low Risk (11/10/2021)   Alcohol Screen    Last Alcohol Screening Score (AUDIT): 0  Housing: Low Risk (03/20/2024)   Epic    Unable to Pay for Housing in the Last Year: No    Number of Times Moved in the Last Year: 0    Homeless in the Last Year: No  Utilities: Not At Risk (03/20/2024)   Epic    Threatened with loss of utilities: No  Health Literacy: Not on file    FAMILY HISTORY: Family History  Problem Relation Age of Onset   Diabetes Mother    Heart disease Mother    Mental illness Mother    Thyroid  disease Mother    Heart disease Father    Diabetes Father    Mental illness Father     ALLERGIES:  has no known allergies.   MEDICATIONS:  Current Outpatient Medications  Medication Sig Dispense Refill   acetaminophen  (TYLENOL ) 325 MG tablet Take 650 mg by mouth every 6 (six) hours as needed for headache.     amLODipine  (NORVASC ) 10 MG tablet Take 1 tablet (10 mg total) by mouth daily. 90 tablet 1   blood glucose meter kit and supplies Dispense based on patient and insurance preference. Use to check blood sugar daily as directed. DX: E11.9. 1 each 0   cabozantinib (CABOMETYX) 40 MG tablet Take 40 mg by mouth daily. Take on an empty stomach, 1 hour before or 2 hours after meals.     carvedilol  (COREG ) 25 MG tablet Take 1 tablet (25 mg total) by mouth 2 (two) times daily with a meal. 180 tablet 1   fluticasone  (FLONASE ) 50 MCG/ACT nasal spray Place 2 sprays into both nostrils daily. 48 g 0   levothyroxine  (SYNTHROID ) 100 MCG tablet Take 1 tablet (100 mcg total) by mouth daily. 90 tablet 1   loratadine (CLARITIN) 10 MG tablet Take 10 mg by mouth daily.     metFORMIN  (GLUCOPHAGE ) 1000 MG tablet Take 1 tablet (1,000 mg total) by mouth 2 (two) times daily with a meal. 180 tablet 0   montelukast  (SINGULAIR ) 10 MG tablet Take 1 tablet (10 mg total) by mouth once daily. 90 tablet 1   olmesartan  (BENICAR ) 40 MG tablet Take 1 tablet (40 mg total) by mouth  daily. 90 tablet 0   rosuvastatin  (CRESTOR ) 40 MG tablet Take 1 tablet (40 mg total) by mouth daily. 90 tablet 3   ondansetron  (ZOFRAN ) 8 MG tablet Take 1 tablet (8 mg total) by mouth every 8 (eight) hours as needed for nausea or vomiting. (Patient not taking: Reported on 06/26/2024) 20 tablet 2   prochlorperazine  (COMPAZINE ) 10 MG tablet Take 1 tablet (10 mg total) by mouth every 6 (six) hours as needed. (Patient not taking: Reported on 06/26/2024) 30 tablet 2   No current facility-administered medications for this visit.   Facility-Administered Medications Ordered in Other Visits  Medication Dose Route Frequency Provider Last Rate Last Admin   0.9 %  sodium chloride  infusion   Intravenous Continuous Rennie, Govinda R, MD       iron  sucrose (VENOFER ) injection 200 mg  200 mg Intravenous Once Brahmanday, Govinda R, MD       nivolumab  (OPDIVO ) 480 mg in sodium chloride  0.9 % 100 mL chemo infusion  480 mg Intravenous Once Brahmanday, Govinda R, MD        PHYSICAL EXAMINATION: Vitals:   06/26/24 1334 06/26/24 1342  BP: (!) 144/93 (!) 137/92  Pulse: 68   Temp: (!) 97.1 F (36.2 C)   SpO2: 98%    Filed Weights   06/26/24 1334  Weight: 222 lb (100.7 kg)    Physical Exam Constitutional:      Appearance: He is not ill-appearing.  Eyes:     General: No scleral icterus.    Conjunctiva/sclera: Conjunctivae normal.  Cardiovascular:     Rate and Rhythm: Normal rate and regular rhythm.  Abdominal:     General: There is no distension.     Palpations: Abdomen is soft.     Tenderness: There is no abdominal tenderness. There is no guarding.  Musculoskeletal:        General: No deformity.     Right lower leg: No edema.     Left lower leg: No edema.  Lymphadenopathy:     Cervical: No cervical adenopathy.  Skin:    General: Skin is warm and dry.  Coloration: Skin is pale.  Neurological:     Mental Status: He is alert and oriented to person, place, and time. Mental status is at  baseline.  Psychiatric:        Mood and Affect: Mood normal.        Behavior: Behavior normal.    LABORATORY DATA:  I have reviewed the data as listed Lab Results  Component Value Date   WBC 6.9 06/26/2024   HGB 11.9 (L) 06/26/2024   HCT 36.4 (L) 06/26/2024   MCV 80.2 06/26/2024   PLT 227 06/26/2024   Recent Labs    04/16/24 0927 05/15/24 1318 06/26/24 1320  NA 135 135 139  K 3.4* 3.1* 3.4*  CL 102 99 104  CO2 22 24 22   GLUCOSE 139* 162* 162*  BUN 23* 17 17  CREATININE 1.27* 1.53* 1.12  CALCIUM  9.5 8.8* 9.0  GFRNONAA >60 54* >60  PROT 7.6 7.6 7.5  ALBUMIN  3.8 3.2* 3.7  AST 23 41 60*  ALT 17 37 79*  ALKPHOS 63 89 116  BILITOT 0.8 0.7 0.4   Iron /TIBC/Ferritin/ %Sat    Component Value Date/Time   IRON  32 (L) 04/07/2024 0948   TIBC 402 04/07/2024 0948   FERRITIN 22 (L) 04/07/2024 0948   IRONPCTSAT 8 (L) 04/07/2024 0948     RADIOGRAPHIC STUDIES: I have personally reviewed the radiological images as listed and agreed with the findings in the report. No results found.  Cancer of left kidney (HCC) # Hx Left kidney cancer-clear cell carcinoma [ 2021]; # AUG 28th, 2025- ER- CT kidney protocol:  Interval development of multiple hyperdense masses within the nephrectomy bed and within the left periaortic region suspicious for recurrent disease; Interval development of an indeterminate 15 mm nodule within the right adrenal gland; Interval development of a 18 mm mean diameter nodule within the left lower lobe suspicious for development of pulmonary metastatic Disease. ALSO - RIGHT KIDNEY- least 4.6 x 5.2 cm, lower pole suspicious for a primary renal neoplasm or a metastasis.    # Lymph node, biopsy, left RP : CLEAR CELL- METASTATIC RENAL CELL CARCINOMA;  Diagnosis Note : - Immunohistochemical stains CK AE1/AE3, PAX8 and CD10 are  Positive consistent with involvement by the patient's known renal cell carcinoma # IMDC- FAVORABLE- high volume-symptomatic disease- PROCEED WITH p.o.  cabozantinib 40 mg daily along with nivolumab  every 4 weeks. POOR DENTITION-/ no insurance HOLD ZOMETA   # Currently s/p cycle 2 of nibolumab. Tolerating treatment well without significant side effects. Labs today reviewed and acceptable for treatment- protein/creatinine ratio has risen to 1.5. No edema. BP 145/96. Ok to continue cabomethyx. Ok to proceed with nivolumab . Plan for ct c/a/p wo contrast prior to 4th cycle and follow up with Dr Rennie for results.   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 Proceed with venofer  today. Hematuria has resolved. Hmg has improved to 11.9. Plan to check iron  stores at next visit.    # Goals of care- treatment given with palliative intent.    # History of hypothyroidism-  on synthroid  100 mcg/d.    # HTN- well controlled-    # ACP: done-   PS  # DISPOSITION: # Nivo + venofer  today-  # 3.5 weeks- ct c/a/p wo contrast # 4 weeks- labs- (cbc/cmp/random urine protein creatinine ratio, ferritin, iron  studies), Dr Rennie, Nivolumab  - la  No problem-specific Assessment & Plan notes found for this encounter.   Tinnie KANDICE Dawn,  NP 06/26/2024

## 2024-06-27 LAB — T4: T4, Total: 6.6 ug/dL (ref 4.5–12.0)

## 2024-06-29 ENCOUNTER — Encounter: Payer: Self-pay | Admitting: Internal Medicine

## 2024-06-29 ENCOUNTER — Telehealth: Payer: Self-pay | Admitting: Internal Medicine

## 2024-06-29 NOTE — Telephone Encounter (Signed)
 Called pt to sched CT - left vm for call back - LH

## 2024-07-01 ENCOUNTER — Telehealth: Payer: Self-pay | Admitting: Physician Assistant

## 2024-07-01 DIAGNOSIS — J019 Acute sinusitis, unspecified: Secondary | ICD-10-CM

## 2024-07-01 DIAGNOSIS — B9689 Other specified bacterial agents as the cause of diseases classified elsewhere: Secondary | ICD-10-CM

## 2024-07-01 MED ORDER — AMOXICILLIN-POT CLAVULANATE 875-125 MG PO TABS: 1.0000 | ORAL_TABLET | Freq: Two times a day (BID) | ORAL | 0 refills | Status: DC

## 2024-07-01 NOTE — Progress Notes (Signed)

## 2024-07-03 ENCOUNTER — Encounter: Payer: Self-pay | Admitting: Nurse Practitioner

## 2024-07-03 ENCOUNTER — Telehealth: Payer: Self-pay | Admitting: Nurse Practitioner

## 2024-07-03 NOTE — Telephone Encounter (Signed)
 Called pt to sched CT - pt confirmed date/time/location - pt requested appt reminder via mail - LH

## 2024-07-12 ENCOUNTER — Other Ambulatory Visit: Payer: Self-pay | Admitting: Internal Medicine

## 2024-07-12 DIAGNOSIS — I10 Essential (primary) hypertension: Secondary | ICD-10-CM

## 2024-07-12 MED FILL — Carvedilol Tab 25 MG: ORAL | 30 days supply | Qty: 60 | Fill #3 | Status: AC

## 2024-07-13 ENCOUNTER — Encounter: Payer: Self-pay | Admitting: Internal Medicine

## 2024-07-13 ENCOUNTER — Other Ambulatory Visit: Payer: Self-pay

## 2024-07-13 ENCOUNTER — Other Ambulatory Visit: Payer: Self-pay | Admitting: Internal Medicine

## 2024-07-13 DIAGNOSIS — I10 Essential (primary) hypertension: Secondary | ICD-10-CM

## 2024-07-13 MED FILL — Carvedilol Tab 25 MG: ORAL | 30 days supply | Qty: 60 | Fill #3 | Status: AC

## 2024-07-15 ENCOUNTER — Other Ambulatory Visit: Payer: Self-pay

## 2024-07-15 ENCOUNTER — Encounter: Payer: Self-pay | Admitting: Internal Medicine

## 2024-07-15 MED FILL — Olmesartan Medoxomil Tab 40 MG: ORAL | 90 days supply | Qty: 90 | Fill #0 | Status: CN

## 2024-07-15 NOTE — Telephone Encounter (Signed)
 Requested Prescriptions  Pending Prescriptions Disp Refills   olmesartan  (BENICAR ) 40 MG tablet 90 tablet 0    Sig: Take 1 tablet (40 mg total) by mouth daily.     Cardiovascular:  Angiotensin Receptor Blockers Failed - 07/15/2024  9:59 AM      Failed - K in normal range and within 180 days    Potassium  Date Value Ref Range Status  06/26/2024 3.4 (L) 3.5 - 5.1 mmol/L Final  09/22/2012 3.9 3.5 - 5.1 mmol/L Final         Failed - Last BP in normal range    BP Readings from Last 1 Encounters:  06/26/24 (!) 146/92         Passed - Cr in normal range and within 180 days    Creatinine  Date Value Ref Range Status  06/26/2024 1.12 0.61 - 1.24 mg/dL Final   Creat  Date Value Ref Range Status  03/13/2018 0.91 0.60 - 1.35 mg/dL Final   Creatinine, Urine  Date Value Ref Range Status  06/26/2024 82 mg/dL Final    Comment:    NO NORMAL RANGE ESTABLISHED FOR THIS TEST         Passed - Patient is not pregnant      Passed - Valid encounter within last 6 months    Recent Outpatient Visits           4 months ago Encounter for general adult medical examination with abnormal findings   [facility] [place], Angeline ORN, NP   10 months ago Type 2 diabetes mellitus with hyperglycemia, without long-term current use of insulin  [facility])    [facility] [place], Angeline ORN, [place]

## 2024-07-17 ENCOUNTER — Ambulatory Visit
Admission: RE | Admit: 2024-07-17 | Discharge: 2024-07-17 | Disposition: A | Discharge status: Home / Self Care | Payer: Self-pay | Source: Ambulatory Visit | Attending: Nurse Practitioner | Admitting: Nurse Practitioner

## 2024-07-17 DIAGNOSIS — Z5112 Encounter for antineoplastic immunotherapy: Secondary | ICD-10-CM | POA: Insufficient documentation

## 2024-07-17 DIAGNOSIS — C642 Malignant neoplasm of left kidney, except renal pelvis: Secondary | ICD-10-CM | POA: Insufficient documentation

## 2024-07-22 ENCOUNTER — Other Ambulatory Visit: Payer: Self-pay

## 2024-07-22 ENCOUNTER — Telehealth: Payer: Self-pay

## 2024-07-22 DIAGNOSIS — I10 Essential (primary) hypertension: Secondary | ICD-10-CM

## 2024-07-22 MED ORDER — OLMESARTAN MEDOXOMIL 40 MG PO TABS: 40.0000 mg | ORAL_TABLET | Freq: Every day | ORAL | 0 refills | Status: AC

## 2024-07-22 NOTE — Telephone Encounter (Signed)
 Prescription resent to [facility], patient notified

## 2024-07-22 NOTE — Telephone Encounter (Signed)
 Copied from CRM #[id number]. Topic: Clinical - Medication Question >> Jul 22, 2024 11:30 AM Revonda D wrote: Reason for CRM: Pt stated that he would like to have the olmesartan  (BENICAR ) 40 MG tablet resent to the [facility] [address] [place] - [address]. Pt stated that it was sent to the [facility] community pharmacy and he requested the [facility] pharmacy because its cheaper. Pt would like a callback with an update.

## 2024-07-23 ENCOUNTER — Other Ambulatory Visit: Payer: Self-pay | Admitting: *Deleted

## 2024-07-23 DIAGNOSIS — C642 Malignant neoplasm of left kidney, except renal pelvis: Secondary | ICD-10-CM

## 2024-07-24 ENCOUNTER — Encounter: Payer: Self-pay | Admitting: Internal Medicine

## 2024-07-24 ENCOUNTER — Inpatient Hospital Stay: Payer: Self-pay | Admitting: Internal Medicine

## 2024-07-24 ENCOUNTER — Inpatient Hospital Stay: Payer: Self-pay

## 2024-07-24 ENCOUNTER — Inpatient Hospital Stay: Payer: Self-pay | Attending: Internal Medicine

## 2024-07-24 VITALS — BP 164/99 | HR 76 | Temp 96.7°F | Resp 16 | Ht 67.0 in | Wt 221.3 lb

## 2024-07-24 VITALS — BP 153/100 | HR 63

## 2024-07-24 DIAGNOSIS — E039 Hypothyroidism, unspecified: Secondary | ICD-10-CM | POA: Insufficient documentation

## 2024-07-24 DIAGNOSIS — I10 Essential (primary) hypertension: Secondary | ICD-10-CM | POA: Insufficient documentation

## 2024-07-24 DIAGNOSIS — C642 Malignant neoplasm of left kidney, except renal pelvis: Secondary | ICD-10-CM | POA: Insufficient documentation

## 2024-07-24 DIAGNOSIS — C772 Secondary and unspecified malignant neoplasm of intra-abdominal lymph nodes: Secondary | ICD-10-CM | POA: Insufficient documentation

## 2024-07-24 DIAGNOSIS — C7800 Secondary malignant neoplasm of unspecified lung: Secondary | ICD-10-CM | POA: Insufficient documentation

## 2024-07-24 DIAGNOSIS — Z79890 Hormone replacement therapy (postmenopausal): Secondary | ICD-10-CM

## 2024-07-24 DIAGNOSIS — Z905 Acquired absence of kidney: Secondary | ICD-10-CM | POA: Insufficient documentation

## 2024-07-24 DIAGNOSIS — R809 Proteinuria, unspecified: Secondary | ICD-10-CM

## 2024-07-24 DIAGNOSIS — R319 Hematuria, unspecified: Secondary | ICD-10-CM

## 2024-07-24 DIAGNOSIS — Z5112 Encounter for antineoplastic immunotherapy: Secondary | ICD-10-CM | POA: Insufficient documentation

## 2024-07-24 DIAGNOSIS — D508 Other iron deficiency anemias: Secondary | ICD-10-CM

## 2024-07-24 DIAGNOSIS — R7989 Other specified abnormal findings of blood chemistry: Secondary | ICD-10-CM | POA: Insufficient documentation

## 2024-07-24 LAB — CBC WITH DIFFERENTIAL (CANCER CENTER ONLY)
Abs Immature Granulocytes: 0.03 K/uL (ref 0.00–0.07)
Basophils Absolute: 0 K/uL (ref 0.0–0.1)
Basophils Relative: 0 %
Eosinophils Absolute: 0.5 K/uL (ref 0.0–0.5)
Eosinophils Relative: 6 %
HCT: 38.3 % — ABNORMAL LOW (ref 39.0–52.0)
Hemoglobin: 12.8 g/dL — ABNORMAL LOW (ref 13.0–17.0)
Immature Granulocytes: 0 %
Lymphocytes Relative: 17 %
Lymphs Abs: 1.4 K/uL (ref 0.7–4.0)
MCH: 27.4 pg (ref 26.0–34.0)
MCHC: 33.4 g/dL (ref 30.0–36.0)
MCV: 81.8 fL (ref 80.0–100.0)
Monocytes Absolute: 0.8 K/uL (ref 0.1–1.0)
Monocytes Relative: 9 %
Neutro Abs: 5.5 K/uL (ref 1.7–7.7)
Neutrophils Relative %: 68 %
Platelet Count: 222 K/uL (ref 150–400)
RBC: 4.68 MIL/uL (ref 4.22–5.81)
RDW: 18 % — ABNORMAL HIGH (ref 11.5–15.5)
WBC Count: 8.2 K/uL (ref 4.0–10.5)
nRBC: 0 % (ref 0.0–0.2)

## 2024-07-24 LAB — CMP (CANCER CENTER ONLY)
ALT: 82 U/L — ABNORMAL HIGH (ref 0–44)
AST: 73 U/L — ABNORMAL HIGH (ref 15–41)
Albumin: 3.8 g/dL (ref 3.5–5.0)
Alkaline Phosphatase: 100 U/L (ref 38–126)
Anion gap: 12 (ref 5–15)
BUN: 21 mg/dL — ABNORMAL HIGH (ref 6–20)
CO2: 26 mmol/L (ref 22–32)
Calcium: 9.4 mg/dL (ref 8.9–10.3)
Chloride: 101 mmol/L (ref 98–111)
Creatinine: 1.24 mg/dL (ref 0.61–1.24)
GFR, Estimated: >60 mL/min
Glucose, Bld: 131 mg/dL — ABNORMAL HIGH (ref 70–99)
Potassium: 3.6 mmol/L (ref 3.5–5.1)
Sodium: 138 mmol/L (ref 135–145)
Total Bilirubin: 0.5 mg/dL (ref 0.0–1.2)
Total Protein: 7.5 g/dL (ref 6.5–8.1)

## 2024-07-24 LAB — IRON AND TIBC
Iron: 51 ug/dL (ref 45–182)
Saturation Ratios: 16 % — ABNORMAL LOW (ref 17.9–39.5)
TIBC: 314 ug/dL (ref 250–450)
UIBC: 262 ug/dL

## 2024-07-24 LAB — PROTEIN / CREATININE RATIO, URINE
Creatinine, Urine: 47 mg/dL
Protein Creatinine Ratio: 1.7 mg/mg — ABNORMAL HIGH
Total Protein, Urine: 79 mg/dL

## 2024-07-24 LAB — FERRITIN: Ferritin: 318 ng/mL (ref 24–336)

## 2024-07-24 MED ORDER — SODIUM CHLORIDE 0.9 % IV SOLN
480.0000 mg | Freq: Once | INTRAVENOUS | Status: AC
  Administered 2024-07-24: 480 mg via INTRAVENOUS
  Filled 2024-07-24: qty 48

## 2024-07-24 MED ORDER — SODIUM CHLORIDE 0.9 % IV SOLN
INTRAVENOUS | Status: DC
  Filled 2024-07-24: qty 250

## 2024-07-24 MED ORDER — HYDRALAZINE HCL 25 MG PO TABS: 25.0000 mg | ORAL_TABLET | Freq: Three times a day (TID) | ORAL | 3 refills | Status: AC

## 2024-07-24 NOTE — Patient Instructions (Signed)
 [facility] - A DEPT OF [facility]  Discharge Instructions: Thank you for choosing [facility] to provide your oncology and hematology care.  If you have a lab appointment with the Cancer Center, please go directly to the Cancer Center and check in at the registration area.  Wear comfortable clothing and clothing appropriate for easy access to any Portacath or PICC line.   We strive to give you quality time with your provider. You may need to reschedule your appointment if you arrive late (15 or more minutes).  Arriving late affects you and other patients whose appointments are after yours.  Also, if you miss three or more appointments without notifying the office, you may be dismissed from the clinic at the provider's discretion.      For prescription refill requests, have your pharmacy contact our office and allow 72 hours for refills to be completed.    Today you received the following chemotherapy and/or immunotherapy agents Opdivo      To help prevent nausea and vomiting after your treatment, we encourage you to take your nausea medication as directed.  BELOW ARE SYMPTOMS THAT SHOULD BE REPORTED IMMEDIATELY: *FEVER GREATER THAN 100.4 F (38 C) OR HIGHER *CHILLS OR SWEATING *NAUSEA AND VOMITING THAT IS NOT CONTROLLED WITH YOUR NAUSEA MEDICATION *UNUSUAL SHORTNESS OF BREATH *UNUSUAL BRUISING OR BLEEDING *URINARY PROBLEMS (pain or burning when urinating, or frequent urination) *BOWEL PROBLEMS (unusual diarrhea, constipation, pain near the anus) TENDERNESS IN MOUTH AND THROAT WITH OR WITHOUT PRESENCE OF ULCERS (sore throat, sores in mouth, or a toothache) UNUSUAL RASH, SWELLING OR PAIN  UNUSUAL VAGINAL DISCHARGE OR ITCHING   Items with * indicate a potential emergency and should be followed up as soon as possible or go to the Emergency Department if any problems should occur.  Please show the CHEMOTHERAPY ALERT CARD or IMMUNOTHERAPY ALERT  CARD at check-in to the Emergency Department and triage nurse.  Should you have questions after your visit or need to cancel or reschedule your appointment, please contact [facility] - A DEPT OF [facility]  [phone] and follow the prompts.  Office hours are 8:00 a.m. to 4:30 p.m. Monday - Friday. Please note that voicemails left after 4:00 p.m. may not be returned until the following business day.  We are closed weekends and major holidays. You have access to a nurse at all times for urgent questions. Please call the main number to the clinic [phone] and follow the prompts.  For any non-urgent questions, you may also contact your provider using MyChart. We now offer e-Visits for anyone 46 and older to request care online for non-urgent symptoms. For details visit [email or web address].   Also download the MyChart app! Go to the app store, search "MyChart", open the app, select [facility], and log in with your MyChart username and password.

## 2024-07-24 NOTE — Progress Notes (Signed)
 Was treated 2 weeks ago for URI, was taking amoxicillin .  CT CAP 07/17/24.

## 2024-07-24 NOTE — Progress Notes (Signed)
 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 Sniskinski]; July CT 2020- mid anterior left kidney measuring 8.6 x 8.1 x 9.4 cm; CT chest negative. #April 06, 2019-ADJUVANT Sutent  50 mg 2 weeks on 1 week off.    # SEP 23rd, 2025-  STAGE IV RECURRENCE- s/p Bx- CABO- + NIVO     # DM- 2- OHA/ HTN  DIAGNOSIS: Left kidney cancer  STAGE: 3       ;GOALS: Cure  CURRENT/MOST RECENT THERAPY: Sutent     History of kidney cancer  02/26/2019 Initial Diagnosis   Cancer of left kidney (HCC)   Cancer of left kidney (HCC)  12/16/2020 Initial Diagnosis   Cancer of left kidney (HCC)   04/16/2024 -  Chemotherapy   Patient is on Treatment Plan : RENAL CELL Nivolumab  (480) q28d       HISTORY OF PRESENTING ILLNESS: Patient ambulating- independently.   Alone/Accompanied by family.   Braylon Grenda Pat 54 y.o.  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  Discussed the use of AI scribe software for clinical note transcription with the patient, who gave verbal consent to proceed.  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  He is receiving Cabometyx and nivolumab  for metastatic recurrent left renal cell carcinoma. He reports no active side effects from  therapy and specifically denies nausea, vomiting, pain, skin rash, or abnormal bleeding. He notes only dry skin, which he attributes to his work environment, and observes hair depigmentation since starting therapy, with some dark hair returning. He remains active and continues to work.  Recent CT imaging was performed to assess disease status; preliminary review suggests a reduction in the size of pulmonary metastatic lesions compared to prior imaging, indicating a positive response to therapy. Laboratory studies show mildly abnormal liver function tests, while thyroid  function remains normal. He has received six iron  infusions and three doses of Vocadiva previously. He denies new or enlarging masses or other concerning symptoms.  He is being monitored for hypertension with proteinuria, which is of concern given his underlying renal malignancy and ongoing therapies. His blood pressure remains elevated, and he occasionally misses doses of antihypertensive medications due to a busy morning routine. He is currently taking amlodipine , carvedilol , and olmesartan , with a recent refill of olmesartan . He denies hematuria and reports no issues with his current medications.      Review of Systems  Constitutional:  Positive for malaise/fatigue. Negative for chills, diaphoresis, fever and weight loss.  HENT:  Negative for nosebleeds and sore throat.   Eyes:  Negative for double vision.  Respiratory:  Negative for cough, hemoptysis, sputum production, shortness of breath and wheezing.   Cardiovascular:  Negative for chest pain, palpitations, orthopnea and leg swelling.  Gastrointestinal:  Negative for abdominal pain, blood in stool, constipation, diarrhea, heartburn, melena, nausea and vomiting.  Genitourinary:  Negative for dysuria, frequency  and urgency.  Musculoskeletal:  Positive for back pain. Negative for joint pain.  Skin: Negative.  Negative for itching and rash.  Neurological:  Negative for dizziness,  tingling, focal weakness, weakness and headaches.  Endo/Heme/Allergies:  Does not bruise/bleed easily.  Psychiatric/Behavioral:  Negative for depression. The patient is not nervous/anxious and does not have insomnia.     MEDICAL HISTORY:  Past Medical History:  Diagnosis Date   Allergy    Diabetes mellitus without complication (HCC)    Hypertension    Renal cancer, left (HCC) 01/2019   Left Nephrectomy    SURGICAL HISTORY: Past Surgical History:  Procedure Laterality Date   CYSTOSCOPY  02/06/2019   Procedure: CYSTOSCOPY;  Surgeon: Francisca Redell BROCKS, MD;  Location: [facility] ORS;  Service: Urology;;   LAPAROSCOPIC NEPHRECTOMY, HAND ASSISTED Left 02/06/2019   Procedure: HAND ASSISTED LAPAROSCOPIC LEFT RADICAL NEPHRECTOMY;  Surgeon: Francisca Redell BROCKS, MD;  Location: [facility] ORS;  Service: Urology;  Laterality: Left;   NO PAST SURGERIES      SOCIAL HISTORY: Social History   Socioeconomic History   Marital status: Married    Spouse name: Not on file   Number of children: Not on file   Years of education: Not on file   Highest education level: GED or equivalent  Occupational History   Not on file  Tobacco Use   Smoking status: Never   Smokeless tobacco: Never  Vaping Use   Vaping status: Never Used  Substance and Sexual Activity   Alcohol use: No   Drug use: No   Sexual activity: Yes    Birth control/protection: Other-see comments    Comment: mutually monogamous relationship partner postmenopausal  Other Topics Concern   Not on file  Social History Narrative   In [place]/ with wife; son [downs syndrome]; no smoking/ no alcohol; works for engineer, water.    Social Drivers of Health   Tobacco Use: Low Risk (07/24/2024)   Patient History    Smoking Tobacco Use: Never    Smokeless Tobacco Use: Never    Passive Exposure: Not on file  Financial Resource Strain: High Risk (03/05/2024)   Overall Financial Resource Strain (CARDIA)    Difficulty of Paying Living Expenses: Hard   Food Insecurity: No Food Insecurity (03/20/2024)   Epic    Worried About Programme Researcher, Broadcasting/film/video in the Last Year: Never true    Ran Out of Food in the Last Year: Never true  Recent Concern: Food Insecurity - Food Insecurity Present (03/05/2024)   Epic    Worried About Programme Researcher, Broadcasting/film/video in the Last Year: Sometimes true    Ran Out of Food in the Last Year: Sometimes true  Transportation Needs: No Transportation Needs (03/20/2024)   Epic    Lack of Transportation (Medical): No    Lack of Transportation (Non-Medical): No  Physical Activity: Insufficiently Active (03/05/2024)   Exercise Vital Sign    Days of Exercise per Week: 5 days    Minutes of Exercise per Session: 20 min  Stress: No Stress Concern Present (03/05/2024)   [employer] of Occupational Health - Occupational Stress Questionnaire    Feeling of Stress: Only a Couillard  Social Connections: Unknown (03/05/2024)   Social Connection and Isolation Panel    Frequency of Communication with Friends and Family: Once a week    Frequency of Social Gatherings with Friends and Family: Patient declined    Attends Religious Services: Never    Database Administrator or Organizations: No  Attends Banker Meetings: Not on file    Marital Status: Married  Intimate Partner Violence: Not At Risk (03/20/2024)   Epic    Fear of Current or Ex-Partner: No    Emotionally Abused: No    Physically Abused: No    Sexually Abused: No  Depression (PHQ2-9): Low Risk (07/24/2024)   Depression (PHQ2-9)    PHQ-2 Score: 1  Alcohol Screen: Low Risk (11/10/2021)   Alcohol Screen    Last Alcohol Screening Score (AUDIT): 0  Housing: Low Risk (03/20/2024)   Epic    Unable to Pay for Housing in the Last Year: No    Number of Times Moved in the Last Year: 0    Homeless in the Last Year: No  Utilities: Not At Risk (03/20/2024)   Epic    Threatened with loss of utilities: No  Health Literacy: Not on file    FAMILY HISTORY: Family History  Problem  Relation Age of Onset   Diabetes Mother    Heart disease Mother    Mental illness Mother    Thyroid  disease Mother    Heart disease Father    Diabetes Father    Mental illness Father     ALLERGIES:  has no known allergies.  MEDICATIONS:  Current Outpatient Medications  Medication Sig Dispense Refill   acetaminophen  (TYLENOL ) 325 MG tablet Take 650 mg by mouth every 6 (six) hours as needed for headache.     amLODipine  (NORVASC ) 10 MG tablet Take 1 tablet (10 mg total) by mouth daily. 90 tablet 1   blood glucose meter kit and supplies Dispense based on patient and insurance preference. Use to check blood sugar daily as directed. DX: E11.9. 1 each 0   cabozantinib (CABOMETYX) 40 MG tablet Take 40 mg by mouth daily. Take on an empty stomach, 1 hour before or 2 hours after meals.     carvedilol  (COREG ) 25 MG tablet Take 1 tablet (25 mg total) by mouth 2 (two) times daily with a meal. 180 tablet 1   fluticasone  (FLONASE ) 50 MCG/ACT nasal spray Place 2 sprays into both nostrils daily. 48 g 0   hydrALAZINE  (APRESOLINE ) 25 MG tablet Take 1 tablet (25 mg total) by mouth 3 (three) times daily. 90 tablet 3   levothyroxine  (SYNTHROID ) 100 MCG tablet Take 1 tablet (100 mcg total) by mouth daily. 90 tablet 1   loratadine (CLARITIN) 10 MG tablet Take 10 mg by mouth daily.     metFORMIN  (GLUCOPHAGE ) 1000 MG tablet Take 1 tablet (1,000 mg total) by mouth 2 (two) times daily with a meal. 180 tablet 0   montelukast  (SINGULAIR ) 10 MG tablet Take 1 tablet (10 mg total) by mouth once daily. 90 tablet 1   olmesartan  (BENICAR ) 40 MG tablet Take 1 tablet (40 mg total) by mouth daily. 90 tablet 0   ondansetron  (ZOFRAN ) 8 MG tablet Take 1 tablet (8 mg total) by mouth every 8 (eight) hours as needed for nausea or vomiting. 20 tablet 2   prochlorperazine  (COMPAZINE ) 10 MG tablet Take 1 tablet (10 mg total) by mouth every 6 (six) hours as needed. 30 tablet 2   rosuvastatin  (CRESTOR ) 40 MG tablet Take 1 tablet (40 mg  total) by mouth daily. 90 tablet 3   No current facility-administered medications for this visit.   Facility-Administered Medications Ordered in Other Visits  Medication Dose Route Frequency Provider Last Rate Last Admin   0.9 %  sodium chloride  infusion   Intravenous Continuous Dasie Maxwell  G, NP 10 mL/hr at 07/24/24 0937 New Bag at 07/24/24 0937   nivolumab  (OPDIVO ) 480 mg in sodium chloride  0.9 % 100 mL chemo infusion  480 mg Intravenous Once Dasie Tinnie MATSU, NP        PHYSICAL EXAMINATION:   Vitals:   07/24/24 0830 07/24/24 0854  BP: (!) 152/97 (!) 164/99  Pulse: 76   Resp: 16   Temp: (!) 96.7 F (35.9 C)   SpO2: 95%    Filed Weights   07/24/24 0830  Weight: 221 lb 4.8 oz (100.4 kg)    Physical Exam Vitals and nursing note reviewed.  HENT:     Head: Normocephalic and atraumatic.     Mouth/Throat:     Pharynx: Oropharynx is clear.  Eyes:     Extraocular Movements: Extraocular movements intact.     Pupils: Pupils are equal, round, and reactive to light.  Cardiovascular:     Rate and Rhythm: Normal rate and regular rhythm.  Pulmonary:     Comments: Decreased breath sounds bilaterally.  Abdominal:     Palpations: Abdomen is soft.  Musculoskeletal:        General: Normal range of motion.     Cervical back: Normal range of motion.  Skin:    General: Skin is warm.  Neurological:     General: No focal deficit present.     Mental Status: He is alert and oriented to person, place, and time.  Psychiatric:        Behavior: Behavior normal.        Judgment: Judgment normal.     LABORATORY DATA:  I have reviewed the data as listed Lab Results  Component Value Date   WBC 8.2 07/24/2024   HGB 12.8 (L) 07/24/2024   HCT 38.3 (L) 07/24/2024   MCV 81.8 07/24/2024   PLT 222 07/24/2024   Recent Labs    05/15/24 1318 06/26/24 1320 07/24/24 0835  NA 135 139 138  K 3.1* 3.4* 3.6  CL 99 104 101  CO2 24 22 26   GLUCOSE 162* 162* 131*  BUN 17 17 21*  CREATININE  1.53* 1.12 1.24  CALCIUM  8.8* 9.0 9.4  GFRNONAA 54* >60 >60  PROT 7.6 7.5 7.5  ALBUMIN  3.2* 3.7 3.8  AST 41 60* 73*  ALT 37 79* 82*  ALKPHOS 89 116 100  BILITOT 0.7 0.4 0.5    RADIOGRAPHIC STUDIES: I have personally reviewed the radiological images as listed and agreed with the findings in the report. CT CHEST ABDOMEN PELVIS WO CONTRAST Result Date: 07/24/2024 EXAM: CT CHEST, ABDOMEN AND PELVIS WITHOUT CONTRAST 07/17/2024 09:31:54 AM TECHNIQUE: CT of the chest, abdomen and pelvis was performed without the administration of intravenous contrast. Multiplanar reformatted images are provided for review. Automated exposure control, iterative reconstruction, and/or weight based adjustment of the mA/kV was utilized to reduce the radiation dose to as low as reasonably achievable. COMPARISON: 04/17/2024 chest CT and 03/14/2024 unenhanced CT abdomen/pelvis. CLINICAL HISTORY: Kidney cancer, post nephrectomy, monitor; assess treatment response. * Tracking Code: BO * FINDINGS: CHEST: MEDIASTINUM AND LYMPH NODES: Heart and pericardium are unremarkable. 3 vessel coronary atherosclerosis. Atherosclerotic nonaneurysmal thoracic aorta. Normal caliber main pulmonary artery. The central airways are clear. No mediastinal, hilar or axillary lymphadenopathy. LUNGS AND PLEURA: Trace layering bilateral pleural effusions are similar. Several (at least 8) solid pulmonary nodules scattered throughout the lungs, all decreased in size in the interval. Representative 1.4 cm anterior left lower lobe nodule on series 3, image 75, decreased from 2.1 cm.  Representative posteromedial left upper lobe 0.7 cm nodule on image 40, decreased from 1.0 cm. Representative 0.9 cm right upper lobe nodule on series 3, image 41, decreased from 1.3 cm using similar measurement technique. No new or enlarging pulmonary nodules. No acute consolidative airspace disease. No pneumothorax. ABDOMEN AND PELVIS: LIVER: Unremarkable. GALLBLADDER AND BILE DUCTS:  Unremarkable. No biliary ductal dilatation. SPLEEN: No acute abnormality. PANCREAS: No acute abnormality. ADRENAL GLANDS: Right adrenal 1.2 cm nodule with density 33 HU, decreased from 1.5 cm on a 03/14/2024 CT. Normal left adrenal. KIDNEYS, URETERS AND BLADDER: Left nephrectomy bed 1.8 x 1.2 cm soft tissue nodule on image 61, decreased from 2.7 x 1.7 cm. More lateral left nephrectomy bed 1.8 x 0.9 cm nodule on image 57, decreased from 2.6 x 1.3 cm. Anterior lower right renal cortical 3.9 x 2.8 cm solid mass on image 78, decreased from 5.2 x 4.6 cm. No right hydronephrosis. No right renal stones. Urinary bladder is unremarkable. GI AND BOWEL: Stomach demonstrates no acute abnormality. Normal caliber small bowel. Normal appendix. No small or large bowel wall thickening. No significant colonic diverticulosis. REPRODUCTIVE ORGANS: No acute abnormality. PERITONEUM AND RETROPERITONEUM: No ascites. No free air. VASCULATURE: Atherosclerotic nonaneurysmal abdominal aorta. ABDOMINAL AND PELVIS LYMPH NODES: Enlarged left paraaortic 1.5 cm node on image 76 is decreased from 2.3 cm. Enlarged high right retroperitoneal paracaval 1.3 cm node on image 56 is decreased from 2.0 cm. No new abdominal adenopathy. No pelvic adenopathy. BONES AND SOFT TISSUES: Mild thoracic spondylosis. Mild lumbar spondylosis. No acute osseous abnormality. No focal soft tissue abnormality. IMPRESSION: 1. Interval positive response to therapy. Scattered bilateral pulmonary metastases are all decreased in size. Decreased size of the right adrenal nodule, left nephrectomy bed nodules and anterior lower right renal cortical solid mass. Decreased retroperitoneal adenopathy. No new or progressive metastatic disease. 2. Trace layering bilateral pleural effusions, similar. 3. Three-vessel coronary atherosclerosis. 4. Aortic Atherosclerosis (ICD10-I70.0). Electronically signed by: Selinda Blue MD MD 07/24/2024 09:18 AM EST RP Workstation: [id number]       Cancer of left kidney (HCC) # Hx Left kidney cancer-clear cell carcinoma [ 2021]; # AUG 28th, 2025- ER- CT kidney protocol:  Interval development of multiple hyperdense masses within the nephrectomy bed and within the left periaortic region suspicious for recurrent disease; Interval development of an indeterminate 15 mm nodule within the right adrenal gland; Interval development of a 18 mm mean diameter nodule within the left lower lobe suspicious for development of pulmonary metastatic Disease. ALSO - RIGHT KIDNEY- least 4.6 x 5.2 cm, lower pole suspicious for a primary renal neoplasm or a metastasis. # Lymph node, biopsy, left RP : CLEAR CELL- METASTATIC RENAL CELL CARCINOMA;  Diagnosis Note : - Immunohistochemical stains CK AE1/AE3, PAX8 and CD10 are  Positive consistent with involvement by the patient's known renal cell carcinoma # IMDC- FAVORABLE- high volume-symptomatic disease- PROCEED WITH p.o. cabozantinib 40 mg daily along with nivolumab  every 4 weeks. POOR DENTITION-/ no insurance HOLD ZOMETA   # Currently status post NIVO cycle #3- My review of CT scan-done on Jan third 2026-suggestive of response.  Official read pending.  # Proceed with cycle #4 NIVO q 4 W- with cabo . Labs-CBC/chemistries were reviewed with the patient.   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 Plan  Venofer  weekly-   # # History of hypothyroidism-  on synthroid  100 mcg/d-   # HTN- poorly controlled-  continue cozaar/amlodipine ; omlesartan; coreg ; add hydralazine  25 mg  TID- recommend checking BP at home-  # proteinuria: 1.5 gm- sec to poorly controlled HTN sec to ccabo-   # ACP: done-   PS # DISPOSITION: # NIVO-  # 4 weeks-MD--- labs- cbc/cmp; random urine protein creatinine ratio; Nivolumab .possible venofer . # in 8  weeks- MD;  labs- cbc/cmp; random urine protein creatinine ratio; Nivolumab ; venofer - Dr.B.  Addendum-CT scan showed improved  response.  Patient informed.       Cindy JONELLE Joe, MD 07/24/2024 9:40 AM    "

## 2024-07-24 NOTE — Assessment & Plan Note (Addendum)
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 ALSO - RIGHT KIDNEY- least 4.6 x 5.2 cm, lower pole suspicious for a primary renal neoplasm or a metastasis. # Lymph node, biopsy, left RP : CLEAR CELL- METASTATIC RENAL CELL CARCINOMA;  Diagnosis Note : - Immunohistochemical stains CK AE1/AE3, PAX8 and CD10 are  Positive consistent with involvement by the patient's known renal cell carcinoma # IMDC- FAVORABLE- high volume-symptomatic disease- PROCEED WITH p.o. cabozantinib 40 mg daily along with nivolumab  every 4 weeks. POOR DENTITION-/ no insurance HOLD ZOMETA   # Currently status post NIVO cycle #3- My review of CT scan-done on Jan third 2026-suggestive of response.  Official read pending.  # Proceed with cycle #4 NIVO q 4 W- with cabo . Labs-CBC/chemistries were reviewed with the patient.   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 Plan  Venofer  weekly-   # # History of hypothyroidism-  on synthroid  100 mcg/d-   # HTN- poorly controlled-  continue cozaar/amlodipine ; omlesartan; coreg ; add hydralazine  25 mg TID- recommend checking BP at home-  # proteinuria: 1.5 gm- sec to poorly controlled HTN sec to ccabo-   # ACP: done-   PS # DISPOSITION: # NIVO-  # 4 weeks-MD--- labs- cbc/cmp; random urine protein creatinine ratio; Nivolumab .possible venofer . # in 8  weeks- MD;  labs- cbc/cmp; random urine protein creatinine ratio; Nivolumab ; venofer - Dr.B.  Addendum-CT scan showed improved response.  Patient informed.

## 2024-07-25 ENCOUNTER — Other Ambulatory Visit: Payer: Self-pay

## 2024-08-02 ENCOUNTER — Other Ambulatory Visit: Payer: Self-pay | Admitting: Internal Medicine

## 2024-08-03 ENCOUNTER — Other Ambulatory Visit: Payer: Self-pay

## 2024-08-03 ENCOUNTER — Encounter: Payer: Self-pay | Admitting: Internal Medicine

## 2024-08-03 MED ORDER — METFORMIN HCL 1000 MG PO TABS
1000.0000 mg | ORAL_TABLET | Freq: Two times a day (BID) | ORAL | 0 refills | Status: AC
  Filled 2024-08-03: qty 180, 90d supply, fill #0

## 2024-08-03 MED ORDER — MONTELUKAST SODIUM 10 MG PO TABS
10.0000 mg | ORAL_TABLET | Freq: Every day | ORAL | 0 refills | Status: AC
  Filled 2024-08-03: qty 90, 90d supply, fill #0

## 2024-08-03 NOTE — Telephone Encounter (Signed)
 Requested Prescriptions  Pending Prescriptions Disp Refills   montelukast  (SINGULAIR ) 10 MG tablet 90 tablet 0    Sig: Take 1 tablet (10 mg total) by mouth once daily.     Pulmonology:  Leukotriene Inhibitors Passed - 08/03/2024  5:57 PM      Passed - Valid encounter within last 12 months    Recent Outpatient Visits           5 months ago Encounter for general adult medical examination with abnormal findings   [facility] [place], Angeline ORN, NP   11 months ago Type 2 diabetes mellitus with hyperglycemia, without long-term current use of insulin  [facility])    [facility] [place], [place], NP               metFORMIN  (GLUCOPHAGE ) 1000 MG tablet 180 tablet 0    Sig: Take 1 tablet (1,000 mg total) by mouth 2 (two) times daily with a meal.     Endocrinology:  Diabetes - Biguanides Failed - 08/03/2024  5:57 PM      Failed - B12 Level in normal range and within 720 days    No results found for: VITAMINB12       Failed - CBC within normal limits and completed in the last 12 months    WBC  Date Value Ref Range Status  03/30/2024 9.1 4.0 - 10.5 K/uL Final   WBC Count  Date Value Ref Range Status  07/24/2024 8.2 4.0 - 10.5 K/uL Final   RBC  Date Value Ref Range Status  07/24/2024 4.68 4.22 - 5.81 MIL/uL Final   Hemoglobin  Date Value Ref Range Status  07/24/2024 12.8 (L) 13.0 - 17.0 g/dL Final   HGB  Date Value Ref Range Status  09/22/2012 12.5 (L) 13.0 - 18.0 g/dL Final   HCT  Date Value Ref Range Status  07/24/2024 38.3 (L) 39.0 - 52.0 % Final  09/22/2012 38.1 (L) 40.0 - 52.0 % Final   MCHC  Date Value Ref Range Status  07/24/2024 33.4 30.0 - 36.0 g/dL Final   [facility]  Date Value Ref Range Status  07/24/2024 27.4 26.0 - 34.0 pg Final   MCV  Date Value Ref Range Status  07/24/2024 81.8 80.0 - 100.0 fL Final  09/22/2012 81 80 - 100 fL Final   No results found for: PLTCOUNTKUC, LABPLAT, POCPLA RDW  Date Value  Ref Range Status  07/24/2024 18.0 (H) 11.5 - 15.5 % Final  09/22/2012 15.0 (H) 11.5 - 14.5 % Final         Passed - Cr in normal range and within 360 days    Creatinine  Date Value Ref Range Status  07/24/2024 1.24 0.61 - 1.24 mg/dL Final   Creat  Date Value Ref Range Status  03/13/2018 0.91 0.60 - 1.35 mg/dL Final   Creatinine, Urine  Date Value Ref Range Status  07/24/2024 47 mg/dL Final    Comment:    NO NORMAL RANGE ESTABLISHED FOR THIS TEST         Passed - HBA1C is between 0 and 7.9 and within 180 days    Hgb A1c MFr Bld  Date Value Ref Range Status  03/06/2024 6.3 (H) 4.8 - 5.6 % Final    Comment:    (NOTE) Diagnosis of Diabetes The following HbA1c ranges recommended by the American Diabetes Association (ADA) may be used as an aid in the diagnosis of diabetes mellitus.  Hemoglobin  Suggested A1C NGSP%              Diagnosis  <5.7                   Non Diabetic  5.7-6.4                Pre-Diabetic  >6.4                   Diabetic  <7.0                   Glycemic control for                       adults with diabetes.           Passed - eGFR in normal range and within 360 days    GFR, Est African American  Date Value Ref Range Status  03/13/2018 116 > OR = 60 mL/min/1.91m2 Final   GFR calc Af Amer  Date Value Ref Range Status  04/15/2020 >60 >60 mL/min Final   GFR, Est Non African American  Date Value Ref Range Status  03/13/2018 100 > OR = 60 mL/min/1.65m2 Final   GFR, Estimated  Date Value Ref Range Status  07/24/2024 >60 >60 mL/min Final    Comment:    (NOTE) Calculated using the CKD-EPI Creatinine Equation (2021)          Passed - Valid encounter within last 6 months    Recent Outpatient Visits           5 months ago Encounter for general adult medical examination with abnormal findings   [facility] [place], [place] W, NP   11 months ago Type 2 diabetes mellitus with hyperglycemia, without  long-term current use of insulin  [facility])   [place] [facility] [place], Angeline ORN, [place]

## 2024-08-04 ENCOUNTER — Other Ambulatory Visit: Payer: Self-pay | Admitting: *Deleted

## 2024-08-04 DIAGNOSIS — C642 Malignant neoplasm of left kidney, except renal pelvis: Secondary | ICD-10-CM

## 2024-08-04 MED ORDER — CABOMETYX 40 MG PO TABS: 40.0000 mg | ORAL_TABLET | Freq: Every day | ORAL | 1 refills | Status: AC

## 2024-08-21 ENCOUNTER — Inpatient Hospital Stay: Payer: Self-pay

## 2024-08-21 ENCOUNTER — Encounter: Payer: Self-pay | Admitting: Internal Medicine

## 2024-08-21 ENCOUNTER — Inpatient Hospital Stay: Payer: Self-pay | Admitting: Internal Medicine

## 2024-08-21 ENCOUNTER — Inpatient Hospital Stay: Payer: Self-pay | Attending: Internal Medicine

## 2024-08-21 VITALS — BP 141/78 | HR 63 | Resp 16

## 2024-08-21 VITALS — BP 133/92 | HR 67 | Temp 97.6°F | Ht 67.0 in | Wt 219.6 lb

## 2024-08-21 DIAGNOSIS — I10 Essential (primary) hypertension: Secondary | ICD-10-CM

## 2024-08-21 DIAGNOSIS — C642 Malignant neoplasm of left kidney, except renal pelvis: Secondary | ICD-10-CM

## 2024-08-21 LAB — CBC WITH DIFFERENTIAL (CANCER CENTER ONLY)
Abs Immature Granulocytes: 0.02 10*3/uL (ref 0.00–0.07)
Basophils Absolute: 0 10*3/uL (ref 0.0–0.1)
Basophils Relative: 1 %
Eosinophils Absolute: 0.5 10*3/uL (ref 0.0–0.5)
Eosinophils Relative: 8 %
HCT: 37.8 % — ABNORMAL LOW (ref 39.0–52.0)
Hemoglobin: 12.3 g/dL — ABNORMAL LOW (ref 13.0–17.0)
Immature Granulocytes: 0 %
Lymphocytes Relative: 20 %
Lymphs Abs: 1.3 10*3/uL (ref 0.7–4.0)
MCH: 27.6 pg (ref 26.0–34.0)
MCHC: 32.5 g/dL (ref 30.0–36.0)
MCV: 84.9 fL (ref 80.0–100.0)
Monocytes Absolute: 0.5 10*3/uL (ref 0.1–1.0)
Monocytes Relative: 8 %
Neutro Abs: 4 10*3/uL (ref 1.7–7.7)
Neutrophils Relative %: 63 %
Platelet Count: 211 10*3/uL (ref 150–400)
RBC: 4.45 MIL/uL (ref 4.22–5.81)
RDW: 17.7 % — ABNORMAL HIGH (ref 11.5–15.5)
WBC Count: 6.4 10*3/uL (ref 4.0–10.5)
nRBC: 0 % (ref 0.0–0.2)

## 2024-08-21 LAB — CMP (CANCER CENTER ONLY)
ALT: 73 U/L — ABNORMAL HIGH (ref 0–44)
AST: 60 U/L — ABNORMAL HIGH (ref 15–41)
Albumin: 3.5 g/dL (ref 3.5–5.0)
Alkaline Phosphatase: 100 U/L (ref 38–126)
Anion gap: 10 (ref 5–15)
BUN: 19 mg/dL (ref 6–20)
CO2: 25 mmol/L (ref 22–32)
Calcium: 9.2 mg/dL (ref 8.9–10.3)
Chloride: 102 mmol/L (ref 98–111)
Creatinine: 1.28 mg/dL — ABNORMAL HIGH (ref 0.61–1.24)
GFR, Estimated: >60 mL/min
Glucose, Bld: 102 mg/dL — ABNORMAL HIGH (ref 70–99)
Potassium: 3.9 mmol/L (ref 3.5–5.1)
Sodium: 137 mmol/L (ref 135–145)
Total Bilirubin: 0.4 mg/dL (ref 0.0–1.2)
Total Protein: 6.7 g/dL (ref 6.5–8.1)

## 2024-08-21 LAB — PROTEIN / CREATININE RATIO, URINE
Creatinine, Urine: 62 mg/dL
Protein Creatinine Ratio: 0.6 mg/mg — ABNORMAL HIGH
Total Protein, Urine: 35 mg/dL

## 2024-08-21 MED ORDER — SODIUM CHLORIDE 0.9 % IV SOLN
480.0000 mg | Freq: Once | INTRAVENOUS | Status: AC
  Administered 2024-08-21: 480 mg via INTRAVENOUS
  Filled 2024-08-21: qty 48

## 2024-08-21 MED ORDER — SODIUM CHLORIDE 0.9 % IV SOLN
INTRAVENOUS | Status: DC
  Filled 2024-08-21: qty 250

## 2024-08-21 NOTE — Progress Notes (Signed)
 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 Sniskinski]; July CT 2020- mid anterior left kidney measuring 8.6 x 8.1 x 9.4 cm; CT chest negative. #April 06, 2019-ADJUVANT Sutent  50 mg 2 weeks on 1 week off.    # SEP 23rd, 2025-  STAGE IV RECURRENCE- s/p Bx- CABO- + NIVO     # DM- 2- OHA/ HTN  DIAGNOSIS: Left kidney cancer  STAGE: 3       ;GOALS: Cure  CURRENT/MOST RECENT THERAPY: Sutent     History of kidney cancer  02/26/2019 Initial Diagnosis   Cancer of left kidney (HCC)   Cancer of left kidney (HCC)  12/16/2020 Initial Diagnosis   Cancer of left kidney (HCC)   04/16/2024 -  Chemotherapy   Patient is on Treatment Plan : RENAL CELL Nivolumab  (480) q28d       HISTORY OF PRESENTING ILLNESS: Patient ambulating- independently.   Alone/Accompanied by family.   Charles Mathis 54 y.o.  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  Discussed the use of AI scribe software for clinical note transcription with the patient, who gave verbal consent to proceed.  History of Present Illness   Charles Mathis is a 54 year old male with metastatic renal cell carcinoma who presents for oncology follow-up to assess treatment response and management of chemotherapy-induced hypertension.  He is currently receiving combination therapy with nivolumab , cabozantinib for metastatic renal cell carcinoma. Serial imaging, including a CT scan four months ago and a  follow-up scan in January, were performed to monitor disease status. He denies new symptoms related to malignancy, including hematuria or other genitourinary complaints, and reports no significant adverse effects from chemotherapy. He notes persistent sinus congestion, which he attributes to environmental factors.  He is taking amlodipine  and another antihypertensive agent three times daily, and confirms adherence to his regimen. He has not monitored his blood pressure at home recently due to lack of a meter, but intends to obtain one. He is also taking thyroid  medication as prescribed. He denies symptoms of hypertensive complications or other acute issues.      Review of Systems  Constitutional:  Positive for malaise/fatigue. Negative for chills, diaphoresis, fever and weight loss.  HENT:  Negative for nosebleeds and sore throat.   Eyes:  Negative for double vision.  Respiratory:  Negative for cough, hemoptysis, sputum production, shortness of breath and wheezing.   Cardiovascular:  Negative for chest pain, palpitations, orthopnea and leg swelling.  Gastrointestinal:  Negative for abdominal pain, blood in stool, constipation, diarrhea, heartburn, melena, nausea and vomiting.  Genitourinary:  Negative for dysuria, frequency and urgency.  Musculoskeletal:  Positive for back pain. Negative for joint pain.  Skin: Negative.  Negative for itching and rash.  Neurological:  Negative for dizziness, tingling, focal weakness, weakness and headaches.  Endo/Heme/Allergies:  Does not bruise/bleed easily.  Psychiatric/Behavioral:  Negative for depression. The patient is not nervous/anxious and does not have insomnia.     MEDICAL HISTORY:  Past Medical History:  Diagnosis Date   Allergy    Diabetes mellitus  without complication (HCC)    Hypertension    Renal cancer, left (HCC) 01/2019   Left Nephrectomy    SURGICAL HISTORY: Past Surgical History:  Procedure Laterality Date   CYSTOSCOPY  02/06/2019    Procedure: CYSTOSCOPY;  Surgeon: Francisca Redell BROCKS, MD;  Location: [facility] ORS;  Service: Urology;;   LAPAROSCOPIC NEPHRECTOMY, HAND ASSISTED Left 02/06/2019   Procedure: HAND ASSISTED LAPAROSCOPIC LEFT RADICAL NEPHRECTOMY;  Surgeon: Francisca Redell BROCKS, MD;  Location: [facility] ORS;  Service: Urology;  Laterality: Left;   NO PAST SURGERIES      SOCIAL HISTORY: Social History   Socioeconomic History   Marital status: Married    Spouse name: Not on file   Number of children: Not on file   Years of education: Not on file   Highest education level: GED or equivalent  Occupational History   Not on file  Tobacco Use   Smoking status: Never   Smokeless tobacco: Never  Vaping Use   Vaping status: Never Used  Substance and Sexual Activity   Alcohol use: No   Drug use: No   Sexual activity: Yes    Birth control/protection: Other-see comments    Comment: mutually monogamous relationship partner postmenopausal  Other Topics Concern   Not on file  Social History Narrative   In [place]/ with wife; son [downs syndrome]; no smoking/ no alcohol; works for engineer, water.    Social Drivers of Health   Tobacco Use: Low Risk (08/21/2024)   Patient History    Smoking Tobacco Use: Never    Smokeless Tobacco Use: Never    Passive Exposure: Not on file  Financial Resource Strain: High Risk (03/05/2024)   Overall Financial Resource Strain (CARDIA)    Difficulty of Paying Living Expenses: Hard  Food Insecurity: No Food Insecurity (03/20/2024)   Epic    Worried About Programme Researcher, Broadcasting/film/video in the Last Year: Never true    Ran Out of Food in the Last Year: Never true  Recent Concern: Food Insecurity - Food Insecurity Present (03/05/2024)   Epic    Worried About Programme Researcher, Broadcasting/film/video in the Last Year: Sometimes true    Ran Out of Food in the Last Year: Sometimes true  Transportation Needs: No Transportation Needs (03/20/2024)   Epic    Lack of Transportation (Medical): No    Lack of Transportation  (Non-Medical): No  Physical Activity: Insufficiently Active (03/05/2024)   Exercise Vital Sign    Days of Exercise per Week: 5 days    Minutes of Exercise per Session: 20 min  Stress: No Stress Concern Present (03/05/2024)   [employer] of Occupational Health - Occupational Stress Questionnaire    Feeling of Stress: Only a [place]  Social Connections: Unknown (03/05/2024)   Social Connection and Isolation Panel    Frequency of Communication with Friends and Family: Once a week    Frequency of Social Gatherings with Friends and Family: Patient declined    Attends Religious Services: Never    Database Administrator or Organizations: No    Attends Engineer, Structural: Not on file    Marital Status: Married  Intimate Partner Violence: Not At Risk (03/20/2024)   Epic    Fear of Current or Ex-Partner: No    Emotionally Abused: No    Physically Abused: No    Sexually Abused: No  Depression (PHQ2-9): Low Risk (07/24/2024)   Depression (PHQ2-9)    PHQ-2 Score: 1  Alcohol Screen: Low Risk (11/10/2021)   Alcohol  Screen    Last Alcohol Screening Score (AUDIT): 0  Housing: Low Risk (03/20/2024)   Epic    Unable to Pay for Housing in the Last Year: No    Number of Times Moved in the Last Year: 0    Homeless in the Last Year: No  Utilities: Not At Risk (03/20/2024)   Epic    Threatened with loss of utilities: No  Health Literacy: Not on file    FAMILY HISTORY: Family History  Problem Relation Age of Onset   Diabetes Mother    Heart disease Mother    Mental illness Mother    Thyroid  disease Mother    Heart disease Father    Diabetes Father    Mental illness Father     ALLERGIES:  has no known allergies.  MEDICATIONS:  Current Outpatient Medications  Medication Sig Dispense Refill   acetaminophen  (TYLENOL ) 325 MG tablet Take 650 mg by mouth every 6 (six) hours as needed for headache.     amLODipine  (NORVASC ) 10 MG tablet Take 1 tablet (10 mg total) by mouth daily. 90 tablet  1   blood glucose meter kit and supplies Dispense based on patient and insurance preference. Use to check blood sugar daily as directed. DX: E11.9. 1 each 0   cabozantinib (CABOMETYX ) 40 MG tablet Take 1 tablet (40 mg total) by mouth daily. Take on an empty stomach, 1 hour before or 2 hours after meals. 30 tablet 1   carvedilol  (COREG ) 25 MG tablet Take 1 tablet (25 mg total) by mouth 2 (two) times daily with a meal. 180 tablet 1   fluticasone  (FLONASE ) 50 MCG/ACT nasal spray Place 2 sprays into both nostrils daily. 48 g 0   hydrALAZINE  (APRESOLINE ) 25 MG tablet Take 1 tablet (25 mg total) by mouth 3 (three) times daily. 90 tablet 3   levothyroxine  (SYNTHROID ) 100 MCG tablet Take 1 tablet (100 mcg total) by mouth daily. 90 tablet 1   loratadine (CLARITIN) 10 MG tablet Take 10 mg by mouth daily.     metFORMIN  (GLUCOPHAGE ) 1000 MG tablet Take 1 tablet (1,000 mg total) by mouth 2 (two) times daily with a meal. 180 tablet 0   montelukast  (SINGULAIR ) 10 MG tablet Take 1 tablet (10 mg total) by mouth once daily. 90 tablet 0   olmesartan  (BENICAR ) 40 MG tablet Take 1 tablet (40 mg total) by mouth daily. 90 tablet 0   ondansetron  (ZOFRAN ) 8 MG tablet Take 1 tablet (8 mg total) by mouth every 8 (eight) hours as needed for nausea or vomiting. 20 tablet 2   prochlorperazine  (COMPAZINE ) 10 MG tablet Take 1 tablet (10 mg total) by mouth every 6 (six) hours as needed. 30 tablet 2   rosuvastatin  (CRESTOR ) 40 MG tablet Take 1 tablet (40 mg total) by mouth daily. 90 tablet 3   No current facility-administered medications for this visit.   Facility-Administered Medications Ordered in Other Visits  Medication Dose Route Frequency Provider Last Rate Last Admin   0.9 %  sodium chloride  infusion   Intravenous Continuous Urias Sheek R, MD       nivolumab  (OPDIVO ) 480 mg in sodium chloride  0.9 % 100 mL chemo infusion  480 mg Intravenous Once Jayln Madeira R, MD        PHYSICAL EXAMINATION:   Vitals:    08/21/24 0858 08/21/24 0910  BP:  (!) 133/92  Pulse:    Temp:    SpO2: 97%    Filed Weights   08/21/24 0816  Weight: 219 lb 9.6 oz (99.6 kg)    Physical Exam Vitals and nursing note reviewed.  HENT:     Head: Normocephalic and atraumatic.     Mouth/Throat:     Pharynx: Oropharynx is clear.  Eyes:     Extraocular Movements: Extraocular movements intact.     Pupils: Pupils are equal, round, and reactive to light.  Cardiovascular:     Rate and Rhythm: Normal rate and regular rhythm.  Pulmonary:     Comments: Decreased breath sounds bilaterally.  Abdominal:     Palpations: Abdomen is soft.  Musculoskeletal:        General: Normal range of motion.     Cervical back: Normal range of motion.  Skin:    General: Skin is warm.  Neurological:     General: No focal deficit present.     Mental Status: He is alert and oriented to person, place, and time.  Psychiatric:        Behavior: Behavior normal.        Judgment: Judgment normal.     LABORATORY DATA:  I have reviewed the data as listed Lab Results  Component Value Date   WBC 6.4 08/21/2024   HGB 12.3 (L) 08/21/2024   HCT 37.8 (L) 08/21/2024   MCV 84.9 08/21/2024   PLT 211 08/21/2024   Recent Labs    06/26/24 1320 07/24/24 0835 08/21/24 0829  NA 139 138 137  K 3.4* 3.6 3.9  CL 104 101 102  CO2 22 26 25   GLUCOSE 162* 131* 102*  BUN 17 21* 19  CREATININE 1.12 1.24 1.28*  CALCIUM  9.0 9.4 9.2  GFRNONAA >60 >60 >60  PROT 7.5 7.5 6.7  ALBUMIN  3.7 3.8 3.5  AST 60* 73* 60*  ALT 79* 82* 73*  ALKPHOS 116 100 100  BILITOT 0.4 0.5 0.4    RADIOGRAPHIC STUDIES: I have personally reviewed the radiological images as listed and agreed with the findings in the report. No results found.     Cancer of left kidney (HCC) # Hx Left kidney cancer-clear cell carcinoma [ 2021]; # AUG 28th, 2025- ER- CT kidney protocol:  Interval development of multiple hyperdense masses within the nephrectomy bed and within the left  periaortic region suspicious for recurrent disease; Interval development of an indeterminate 15 mm nodule within the right adrenal gland; Interval development of a 18 mm mean diameter nodule within the left lower lobe suspicious for development of pulmonary metastatic Disease. ALSO - RIGHT KIDNEY- least 4.6 x 5.2 cm, lower pole suspicious for a primary renal neoplasm or a metastasis. # Lymph node, biopsy, left RP : CLEAR CELL- METASTATIC RENAL CELL CARCINOMA;  Diagnosis Note : - Immunohistochemical stains CK AE1/AE3, PAX8 and CD10 are  Positive consistent with involvement by the patient's known renal cell carcinoma # IMDC- FAVORABLE- high volume-symptomatic disease- ON p.o. cabozantinib 40 mg daily along with nivolumab  every 4 weeks. POOR DENTITION-/ no insurance HOLD ZOMETA   #  CT scan-CAP- Jan third 2026-1. Interval positive response to therapy. Scattered bilateral pulmonary metastases are all decreased in size. Decreased size of the right adrenal nodule, left nephrectomy bed nodules and anterior lower right renal cortical solid mass. Decreased retroperitoneal adenopathy. No new or progressive metastatic disease.  # Proceed with cycle #4 NIVO q 4 W- with cabo. . Labs-CBC/chemistries were reviewed with the patient.   # Iron  deficient anemia secondary to hematuria- -poor tolerance to gentle iron  - stable.   # # History of hypothyroidism-  on  synthroid  100 mcg/d-   # HTN- poorly controlled-  continue cozaar/amlodipine ; omlesartan; coreg ; CONTINUE hydralazine  25 mg TID- recommend checking BP at home-proteinuria noted-monitor closely.  If worsening consider dose reduction of cabozantinib.  # proteinuria: 1.5 gm- sec to poorly controlled HTN sec to ccabo-   #Incidental findings on Imaging  CT , 2026: Atherosclerosis arteriosclerosis small bilateral pleural effusions I reviewed/discussed/counseled the patient.    # ACP: done-   PS # DISPOSITION: # Recheck BP # NIVO; NO venofer -  # 4 weeks-MD---  labs- cbc/cmp; random urine protein creatinine ratio; thyroid  profile-Nivolumab  - Dr.B.       Cindy JONELLE Joe, MD 08/21/2024 9:19 AM    "

## 2024-08-21 NOTE — Progress Notes (Signed)
 Patient has no concerns

## 2024-08-21 NOTE — Assessment & Plan Note (Addendum)
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 ALSO - RIGHT KIDNEY- least 4.6 x 5.2 cm, lower pole suspicious for a primary renal neoplasm or a metastasis. # Lymph node, biopsy, left RP : CLEAR CELL- METASTATIC RENAL CELL CARCINOMA;  Diagnosis Note : - Immunohistochemical stains CK AE1/AE3, PAX8 and CD10 are  Positive consistent with involvement by the patient's known renal cell carcinoma # IMDC- FAVORABLE- high volume-symptomatic disease- ON p.o. cabozantinib 40 mg daily along with nivolumab  every 4 weeks. POOR DENTITION-/ no insurance HOLD ZOMETA   #  CT scan-CAP- Jan third 2026-1. Interval positive response to therapy. Scattered bilateral pulmonary metastases are all decreased in size. Decreased size of the right adrenal nodule, left nephrectomy bed nodules and anterior lower right renal cortical solid mass. Decreased retroperitoneal adenopathy. No new or progressive metastatic disease.  # Proceed with cycle #4 NIVO q 4 W- with cabo. . Labs-CBC/chemistries were reviewed with the patient.   # Iron  deficient anemia secondary to hematuria- -poor tolerance to gentle iron  - stable.   # # History of hypothyroidism-  on synthroid  100 mcg/d-   # HTN- poorly controlled-  continue cozaar/amlodipine ; omlesartan; coreg ; CONTINUE hydralazine  25 mg TID- recommend checking BP at home-proteinuria noted-monitor closely.  If worsening consider dose reduction of cabozantinib.  # proteinuria: 1.5 gm- sec to poorly controlled HTN sec to ccabo-   #Incidental findings on Imaging  CT , 2026:  Atherosclerosis arteriosclerosis small bilateral pleural effusions I reviewed/discussed/counseled the patient.    # ACP: done-   PS # DISPOSITION: # Recheck BP # NIVO; NO venofer -  # 4 weeks-MD--- labs- cbc/cmp; random urine protein creatinine ratio; thyroid  profile-Nivolumab  - Dr.B.

## 2024-09-04 ENCOUNTER — Encounter: Admitting: Internal Medicine

## 2024-09-18 ENCOUNTER — Inpatient Hospital Stay: Payer: Self-pay

## 2024-09-18 ENCOUNTER — Inpatient Hospital Stay: Payer: Self-pay | Admitting: Internal Medicine
